# Patient Record
Sex: Male | Born: 1945
Health system: Southern US, Community
[De-identification: ages and names within clinical notes are randomized; demographics above are authoritative.]

## PROBLEM LIST (undated history)

## (undated) ENCOUNTER — Inpatient Hospital Stay: Payer: Self-pay

## (undated) DIAGNOSIS — Z86010 Personal history of colonic polyps: Secondary | ICD-10-CM

## (undated) DIAGNOSIS — E785 Hyperlipidemia, unspecified: Secondary | ICD-10-CM

## (undated) DIAGNOSIS — E669 Obesity, unspecified: Secondary | ICD-10-CM

## (undated) DIAGNOSIS — I2510 Atherosclerotic heart disease of native coronary artery without angina pectoris: Secondary | ICD-10-CM

## (undated) DIAGNOSIS — I10 Essential (primary) hypertension: Secondary | ICD-10-CM

## (undated) DIAGNOSIS — T4145XA Adverse effect of unspecified anesthetic, initial encounter: Secondary | ICD-10-CM

## (undated) DIAGNOSIS — T8859XA Other complications of anesthesia, initial encounter: Secondary | ICD-10-CM

## (undated) DIAGNOSIS — I499 Cardiac arrhythmia, unspecified: Secondary | ICD-10-CM

## (undated) DIAGNOSIS — S0410XA Injury of oculomotor nerve, unspecified side, initial encounter: Secondary | ICD-10-CM

## (undated) DIAGNOSIS — K5730 Diverticulosis of large intestine without perforation or abscess without bleeding: Secondary | ICD-10-CM

## (undated) DIAGNOSIS — H532 Diplopia: Secondary | ICD-10-CM

## (undated) DIAGNOSIS — I4892 Unspecified atrial flutter: Secondary | ICD-10-CM

## (undated) DIAGNOSIS — M1990 Unspecified osteoarthritis, unspecified site: Secondary | ICD-10-CM

## (undated) HISTORY — DX: Unspecified atrial flutter: I48.92

## (undated) HISTORY — PX: COLONOSCOPY W/ POLYPECTOMY: SHX1380

## (undated) HISTORY — DX: Injury of oculomotor nerve, unspecified side, initial encounter: S04.10XA

## (undated) HISTORY — DX: Personal history of colonic polyps: Z86.010

## (undated) HISTORY — PX: CERVICAL FUSION: SHX112

## (undated) HISTORY — PX: TONSILLECTOMY: SUR1361

## (undated) HISTORY — DX: Diverticulosis of large intestine without perforation or abscess without bleeding: K57.30

## (undated) HISTORY — DX: Unspecified osteoarthritis, unspecified site: M19.90

## (undated) HISTORY — PX: SYNOVIAL CYST EXCISION: SUR507

## (undated) HISTORY — DX: Essential (primary) hypertension: I10

## (undated) HISTORY — DX: Hyperlipidemia, unspecified: E78.5

## (undated) HISTORY — DX: Obesity, unspecified: E66.9

## (undated) HISTORY — DX: Diplopia: H53.2

## (undated) SURGERY — Surgical Case
Anesthesia: *Unknown

---

## 1999-09-21 ENCOUNTER — Ambulatory Visit (HOSPITAL_COMMUNITY): Admission: RE | Admit: 1999-09-21 | Discharge: 1999-09-21 | Payer: Self-pay | Admitting: Neurosurgery

## 1999-11-09 ENCOUNTER — Ambulatory Visit (HOSPITAL_COMMUNITY): Admission: RE | Admit: 1999-11-09 | Discharge: 1999-11-09 | Payer: Self-pay | Admitting: Neurosurgery

## 1999-11-29 ENCOUNTER — Encounter: Admission: RE | Admit: 1999-11-29 | Discharge: 1999-11-29 | Payer: Self-pay | Admitting: Neurosurgery

## 1999-11-29 ENCOUNTER — Encounter: Payer: Self-pay | Admitting: Neurosurgery

## 2001-04-20 ENCOUNTER — Encounter: Payer: Self-pay | Admitting: Neurosurgery

## 2001-04-20 ENCOUNTER — Ambulatory Visit (HOSPITAL_COMMUNITY): Admission: RE | Admit: 2001-04-20 | Discharge: 2001-04-20 | Payer: Self-pay | Admitting: Neurosurgery

## 2003-12-20 HISTORY — PX: REPLACEMENT TOTAL KNEE: SUR1224

## 2005-12-19 HISTORY — PX: COLON RESECTION: SHX5231

## 2006-04-06 ENCOUNTER — Emergency Department (HOSPITAL_COMMUNITY): Admission: EM | Admit: 2006-04-06 | Discharge: 2006-04-06 | Payer: Self-pay | Admitting: Emergency Medicine

## 2006-04-20 ENCOUNTER — Ambulatory Visit: Payer: Self-pay | Admitting: *Deleted

## 2006-05-16 ENCOUNTER — Ambulatory Visit: Payer: Self-pay

## 2006-05-16 ENCOUNTER — Encounter: Payer: Self-pay | Admitting: Cardiology

## 2006-05-18 ENCOUNTER — Ambulatory Visit: Payer: Self-pay | Admitting: *Deleted

## 2006-06-26 ENCOUNTER — Ambulatory Visit: Payer: Self-pay | Admitting: *Deleted

## 2006-08-01 ENCOUNTER — Ambulatory Visit: Payer: Self-pay | Admitting: *Deleted

## 2006-09-07 ENCOUNTER — Ambulatory Visit: Payer: Self-pay | Admitting: *Deleted

## 2006-12-06 ENCOUNTER — Ambulatory Visit: Payer: Self-pay | Admitting: *Deleted

## 2006-12-07 ENCOUNTER — Ambulatory Visit: Payer: Self-pay | Admitting: *Deleted

## 2007-04-17 ENCOUNTER — Ambulatory Visit: Payer: Self-pay | Admitting: *Deleted

## 2007-04-19 ENCOUNTER — Ambulatory Visit: Payer: Self-pay | Admitting: *Deleted

## 2007-04-19 LAB — CONVERTED CEMR LAB
AST: 31 units/L (ref 0–37)
Albumin: 4.3 g/dL (ref 3.5–5.2)
Bilirubin, Direct: 0.1 mg/dL (ref 0.0–0.3)
Chloride: 104 meq/L (ref 96–112)
Cholesterol: 105 mg/dL (ref 0–200)
Creatinine, Ser: 1 mg/dL (ref 0.4–1.5)
Glucose, Bld: 97 mg/dL (ref 70–99)
HDL: 31.6 mg/dL — ABNORMAL LOW (ref >39.0)
Sodium: 139 meq/L (ref 135–145)
Total Bilirubin: 1.1 mg/dL (ref 0.3–1.2)

## 2007-06-26 ENCOUNTER — Ambulatory Visit: Payer: Self-pay | Admitting: Internal Medicine

## 2007-09-10 ENCOUNTER — Inpatient Hospital Stay (HOSPITAL_COMMUNITY): Admission: RE | Admit: 2007-09-10 | Discharge: 2007-09-14 | Payer: Self-pay | Admitting: Specialist

## 2007-10-02 ENCOUNTER — Ambulatory Visit: Payer: Self-pay | Admitting: Internal Medicine

## 2007-10-02 LAB — CONVERTED CEMR LAB
BUN: 21 mg/dL (ref 6–23)
CO2: 30 meq/L (ref 19–32)
GFR calc Af Amer: 97 mL/min
Potassium: 4.4 meq/L (ref 3.5–5.1)
Sodium: 144 meq/L (ref 135–145)

## 2007-10-22 ENCOUNTER — Encounter: Admission: RE | Admit: 2007-10-22 | Discharge: 2007-10-22 | Payer: Self-pay | Admitting: Family Medicine

## 2007-12-20 DIAGNOSIS — K5730 Diverticulosis of large intestine without perforation or abscess without bleeding: Secondary | ICD-10-CM

## 2007-12-20 DIAGNOSIS — Z860100 Personal history of colon polyps, unspecified: Secondary | ICD-10-CM

## 2007-12-20 HISTORY — DX: Diverticulosis of large intestine without perforation or abscess without bleeding: K57.30

## 2007-12-20 HISTORY — DX: Personal history of colonic polyps: Z86.010

## 2007-12-20 HISTORY — DX: Personal history of colon polyps, unspecified: Z86.0100

## 2007-12-25 ENCOUNTER — Ambulatory Visit: Payer: Self-pay | Admitting: Gastroenterology

## 2007-12-25 LAB — CONVERTED CEMR LAB
ALT: 36 units/L (ref 0–53)
Albumin: 4.5 g/dL (ref 3.5–5.2)
Alkaline Phosphatase: 82 units/L (ref 39–117)
BUN: 14 mg/dL (ref 6–23)
Basophils Absolute: 0 10*3/uL (ref 0.0–0.1)
CO2: 31 meq/L (ref 19–32)
Calcium: 10.2 mg/dL (ref 8.4–10.5)
Folate: 17.4 ng/mL
GFR calc Af Amer: 79 mL/min
GFR calc non Af Amer: 65 mL/min
HCT: 40.9 % (ref 39.0–52.0)
MCHC: 34.5 g/dL (ref 30.0–36.0)
Neutrophils Relative %: 63 % (ref 43.0–77.0)
Platelets: 202 10*3/uL (ref 150–400)
Potassium: 4.7 meq/L (ref 3.5–5.1)
RBC: 4.35 M/uL (ref 4.22–5.81)
RDW: 14 % (ref 11.5–14.6)
Saturation Ratios: 22.7 % (ref 20.0–50.0)
Sed Rate: 10 mm/hr (ref 0–20)
TSH: 0.77 microintl units/mL (ref 0.35–5.50)
Transferrin: 258.1 mg/dL (ref >=212.0)
Vitamin B-12: 354 pg/mL (ref 211–911)

## 2007-12-31 ENCOUNTER — Ambulatory Visit: Payer: Self-pay | Admitting: Gastroenterology

## 2007-12-31 ENCOUNTER — Encounter: Payer: Self-pay | Admitting: Gastroenterology

## 2008-01-17 ENCOUNTER — Ambulatory Visit: Payer: Self-pay | Admitting: Gastroenterology

## 2008-01-21 ENCOUNTER — Ambulatory Visit: Payer: Self-pay | Admitting: Internal Medicine

## 2008-03-20 ENCOUNTER — Encounter (INDEPENDENT_AMBULATORY_CARE_PROVIDER_SITE_OTHER): Payer: Self-pay | Admitting: Surgery

## 2008-03-20 ENCOUNTER — Inpatient Hospital Stay (HOSPITAL_COMMUNITY): Admission: RE | Admit: 2008-03-20 | Discharge: 2008-03-23 | Payer: Self-pay | Admitting: Surgery

## 2008-08-04 ENCOUNTER — Ambulatory Visit: Payer: Self-pay | Admitting: Internal Medicine

## 2008-08-04 LAB — CONVERTED CEMR LAB: AST: 21 units/L (ref 0–37)

## 2008-09-18 ENCOUNTER — Ambulatory Visit: Payer: Self-pay | Admitting: Internal Medicine

## 2009-01-15 ENCOUNTER — Ambulatory Visit: Payer: Self-pay | Admitting: Internal Medicine

## 2009-05-05 ENCOUNTER — Ambulatory Visit: Payer: Self-pay | Admitting: Internal Medicine

## 2009-05-19 ENCOUNTER — Telehealth: Payer: Self-pay | Admitting: Internal Medicine

## 2009-07-07 DIAGNOSIS — E785 Hyperlipidemia, unspecified: Secondary | ICD-10-CM | POA: Insufficient documentation

## 2009-07-07 DIAGNOSIS — I10 Essential (primary) hypertension: Secondary | ICD-10-CM | POA: Insufficient documentation

## 2009-07-09 ENCOUNTER — Ambulatory Visit: Payer: Self-pay | Admitting: Internal Medicine

## 2009-07-09 DIAGNOSIS — I4892 Unspecified atrial flutter: Secondary | ICD-10-CM

## 2009-09-07 ENCOUNTER — Encounter: Payer: Self-pay | Admitting: Internal Medicine

## 2009-09-07 ENCOUNTER — Telehealth: Payer: Self-pay | Admitting: Internal Medicine

## 2009-09-10 ENCOUNTER — Ambulatory Visit: Payer: Self-pay | Admitting: Internal Medicine

## 2009-09-10 ENCOUNTER — Encounter: Payer: Self-pay | Admitting: Cardiovascular Disease

## 2009-09-10 DIAGNOSIS — R42 Dizziness and giddiness: Secondary | ICD-10-CM

## 2009-09-17 ENCOUNTER — Ambulatory Visit: Payer: Self-pay | Admitting: Internal Medicine

## 2009-09-21 ENCOUNTER — Telehealth: Payer: Self-pay | Admitting: Internal Medicine

## 2009-09-24 ENCOUNTER — Encounter (INDEPENDENT_AMBULATORY_CARE_PROVIDER_SITE_OTHER): Payer: Self-pay | Admitting: *Deleted

## 2009-09-25 ENCOUNTER — Telehealth (INDEPENDENT_AMBULATORY_CARE_PROVIDER_SITE_OTHER): Payer: Self-pay | Admitting: *Deleted

## 2009-10-02 ENCOUNTER — Telehealth: Payer: Self-pay | Admitting: Internal Medicine

## 2009-11-03 ENCOUNTER — Encounter: Payer: Self-pay | Admitting: Internal Medicine

## 2010-01-12 ENCOUNTER — Encounter (INDEPENDENT_AMBULATORY_CARE_PROVIDER_SITE_OTHER): Payer: Self-pay | Admitting: *Deleted

## 2010-01-12 ENCOUNTER — Telehealth: Payer: Self-pay | Admitting: Internal Medicine

## 2010-01-26 ENCOUNTER — Telehealth (INDEPENDENT_AMBULATORY_CARE_PROVIDER_SITE_OTHER): Payer: Self-pay | Admitting: *Deleted

## 2010-02-18 ENCOUNTER — Encounter: Admission: RE | Admit: 2010-02-18 | Discharge: 2010-02-18 | Payer: Self-pay | Admitting: Internal Medicine

## 2010-02-25 ENCOUNTER — Encounter: Admission: RE | Admit: 2010-02-25 | Discharge: 2010-02-25 | Payer: Self-pay | Admitting: Internal Medicine

## 2010-03-05 ENCOUNTER — Ambulatory Visit: Payer: Self-pay | Admitting: Internal Medicine

## 2010-11-22 ENCOUNTER — Ambulatory Visit: Payer: Self-pay | Admitting: Internal Medicine

## 2010-11-25 LAB — CONVERTED CEMR LAB
BUN: 23 mg/dL (ref 6–23)
CO2: 28 meq/L (ref 19–32)
Chloride: 99 meq/L (ref 96–112)
Cholesterol: 125 mg/dL (ref 0–200)
Creatinine, Ser: 1.1 mg/dL (ref 0.4–1.5)
LDL Cholesterol: 72 mg/dL (ref 0–99)
Potassium: 4.6 meq/L (ref 3.5–5.1)
Total CHOL/HDL Ratio: 4
Triglycerides: 115 mg/dL (ref 0.0–149.0)
VLDL: 23 mg/dL (ref 0.0–40.0)

## 2010-11-29 ENCOUNTER — Telehealth: Payer: Self-pay | Admitting: Internal Medicine

## 2010-12-10 ENCOUNTER — Telehealth: Payer: Self-pay | Admitting: Internal Medicine

## 2011-01-20 NOTE — Letter (Signed)
Summary: Appointment - Reminder 2  [facility], Main Office  [address]   [place], [place] [address]   Phone: [phone]  Fax: [phone]     January 12, 2010 MRN: [id number]   [name] [address] [place], [place]  [address]   Dear [name],  Our records indicate that it is time to schedule a follow-up appointment with Dr. Tenny Craw. It is very important that we reach you to schedule this appointment. We look forward to participating in your health care needs. Please contact us at the number listed above at your earliest convenience to schedule your appointment.  If you are unable to make an appointment at this time, give [place] a call so we can update our records.   Sincerely,    Migdalia Dk [facility] Scheduling Team

## 2011-01-20 NOTE — Progress Notes (Signed)
Summary: req call back  Phone Note Call from Patient Call back at Home Phone [phone] Call back at [phone]   Caller: Patient Reason for Call: Talk to Nurse Summary of Call: request call back about meds Initial call taken by: Migdalia Dk,  January 12, 2010 2:30 PM  Follow-up for Phone Call        Called pt. back ..he states that his insurance coverage has changed and that he would like a new script for Amlodipine/Benazapril combination medication. Advised him will send to [facility]. Follow-up by: J REISS RN    New/Updated Medications: AMLODIPINE BESY-BENAZEPRIL HCL 5-20 MG CAPS (AMLODIPINE BESY-BENAZEPRIL HCL) 1 tablet every 12 hours Prescriptions: AMLODIPINE BESY-BENAZEPRIL HCL 5-20 MG CAPS (AMLODIPINE BESY-BENAZEPRIL HCL) 1 tablet every 12 hours  #60 x 6   Entered by:   Layne Benton, RN, BSN   Authorized by:   Sherrill Raring, MD, [facility]   Signed by:   Layne Benton, RN, BSN on 01/12/2010   Method used:   Electronically to        [facility]* (retail)       [address] [place], [place]  [address]       Ph: [phone] or [phone]       Fax: [phone]   RxID:   [id number]

## 2011-01-20 NOTE — Progress Notes (Signed)
Summary: test results  Phone Note Call from Patient Call back at Home Phone [phone]   Caller: Patient Reason for Call: Lab or Test Results Initial call taken by: Judie Grieve,  November 29, 2010 10:12 AM  Follow-up for Phone Call        11/29/10--1030am--results of lab and instructions given to pt  Follow-up by: Ledon Snare, RN,  November 29, 2010 10:19 AM

## 2011-01-20 NOTE — Procedures (Signed)
Summary: Holter  Holter   Imported By: Marylou Mccoy 03/05/2010 14:57:19  _____________________________________________________________________  External Attachment:    Type:   Image     Comment:   External Document

## 2011-01-20 NOTE — Assessment & Plan Note (Signed)
Summary: rov   Visit Type:  Follow-up Primary Provider:  Dr Leticia Clas at [facility]  CC:  no compliants.  History of Present Illness: Patient is a 65 year old with a history of paroxysmal atrial flutter, hypertension, dyslipidemia.  I last saw him in the fall.  He was previously followed by J. Adolph Pollack. I saw him last in march.  At the time he was complaining of frontal headaches. since seen the headaches have improved.  Breathing is ok   Denies palpitations.  No CP_ Admits to not being htat active, wants to increase. BPs at home range 130s/60 to 70  Current Medications (verified): 1)  Simvastatin 40 Mg Tabs (Simvastatin) .... Take 1 Tablet By Mouth Every Night 2)  Fish Oil Double Strength 1200 Mg Caps (Omega-3 Fatty Acids) .Marland Kitchen.. 1 Daily 3)  Aspirin 81 Mg Tbec (Aspirin) .... Take One Tablet By Mouth Daily 4)  Celebrex 200 Mg Caps (Celecoxib) .... As Needed 5)  Hydrocodone-Acetaminophen 5-500 Mg Tabs (Hydrocodone-Acetaminophen) .... As Needed 6)  Hydrochlorothiazide 25 Mg Tabs (Hydrochlorothiazide) .... 1/2 Tab By Mouth Once Daily 7)  Norvasc 5 Mg Tabs (Amlodipine Besylate) .Marland Kitchen.. 1 Tablet 2 Times Per Day 8)  Benazepril Hcl 20 Mg Tabs (Benazepril Hcl) .Marland Kitchen.. 1 Tablet 2 Times Per Day 9)  Flax   Oil (Flaxseed (Linseed)) .... Once Daily  Allergies (verified): 1)  ! Niacin  Past History:  Past medical, surgical, family and social histories (including risk factors) reviewed, and no changes noted (except as noted below).  Past Medical History: Reviewed history from 07/09/2009 and no changes required.   Current Problems:  ATRIAL FLUTTER, PAROXYSMAL DYSLIPIDEMIA (ICD-272.4) HYPERTENSION (ICD-401.9)  Past Surgical History: Reviewed history from 07/07/2009 and no changes required.   Cervical fusion and knee surgery.   Family History: Reviewed history from 07/07/2009 and no changes required. Positive for coronary artery disease.   Social History: Reviewed history from 07/07/2009 and no  changes required.  The patient is married.  He is retired.  He does not   smoke or drink.   Review of Systems       All systems reviewed.  Neg to the above problem.  Vital Signs:  Patient profile:   65 year old male Height:      72 inches Weight:      276 pounds BMI:     37.57 Pulse rate:   65 / minute BP sitting:   146 / 72 Cuff size:   large  Vitals Entered By: Hardin Negus, RMA (November 22, 2010 10:08 AM)  Physical Exam  Additional Exam:  Patient is in NAD HEENT:  Normocephalic, atraumatic. EOMI, PERRLA.  Neck: JVP is normal. No thyromegaly. No bruits.  Lungs: clear to auscultation. No rales no wheezes.  Heart: Regular rate and rhythm. Normal S1, S2. No S3.   No significant murmurs. PMI not displaced.  Abdomen:  Supple, nontender. Normal bowel sounds. No masses. No hepatomegaly.  Extremities:   Good distal pulses throughout. No lower extremity edema.  Musculoskeletal :moving all extremities.  Neuro:   alert and oriented x3.    Impression & Recommendations:  Problem # 1:  HYPERTENSION (ICD-401.9) adequate control.  WQOuld check bmet today.  Problem # 2:  ATRIAL FLUTTER, PAROXYSMAL (ICD-427.32) No symptoms.  Keep on regimen. His updated medication list for this problem includes:    Aspirin 81 Mg Tbec (Aspirin) .Marland Kitchen... Take one tablet by mouth daily  Problem # 3:  DYSLIPIDEMIA (ICD-272.4) Contineue.  Check labs.  Will need  to review with norvasc use.  May need to switch. His updated medication list for this problem includes:    Simvastatin 40 Mg Tabs (Simvastatin) .Marland Kitchen... Take 1 tablet by mouth every night  Other Orders: TLB-BMP (Basic Metabolic Panel-BMET) (80048-METABOL) TLB-Lipid Panel (80061-LIPID) TLB-AST (SGOT) (84450-SGOT)  Patient Instructions: 1)  Your physician recommends that you return for lab work in: lab work today..we will call you with results. 2)  Your physician wants you to follow-up in:  August 2012  You will receive a reminder letter in the  mail two months in advance. If you don't receive a letter, please call our office to schedule the follow-up appointment.

## 2011-01-20 NOTE — Progress Notes (Signed)
  Phone Note Outgoing Call   Call placed by: JREISS RN Summary of Call: Amlodipine/Benaz.  Follow-up for Phone Call        Called patient to let him know that [facility] will not cover this combination medication. (they had sent [place] a fax). Therefore we have to order medications separately. Follow-up by: J Vannary Greening RN    New/Updated Medications: NORVASC 5 MG TABS (AMLODIPINE BESYLATE) 1 tablet 2 times per day BENAZEPRIL HCL 20 MG TABS (BENAZEPRIL HCL) 1 tablet 2 times per day Prescriptions: BENAZEPRIL HCL 20 MG TABS (BENAZEPRIL HCL) 1 tablet 2 times per day  #60 x 6   Entered by:   Layne Benton, RN, BSN   Authorized by:   Sherrill Raring, MD, [facility]   Signed by:   Layne Benton, RN, BSN on 01/26/2010   Method used:   Electronically to        [facility]* (retail)       [address] [place], [place]  [address]       Ph: [phone] or [phone]       Fax: [phone]   RxID:   [id number] NORVASC 5 MG TABS (AMLODIPINE BESYLATE) 1 tablet 2 times per day  #60 x 6   Entered by:   Layne Benton, RN, BSN   Authorized by:   Sherrill Raring, MD, [facility]   Signed by:   Layne Benton, RN, BSN on 01/26/2010   Method used:   Electronically to        [facility]* (retail)       [address] [place], [place]  [address]       Ph: [phone] or [phone]       Fax: [phone]   RxID:   [id number]

## 2011-01-20 NOTE — Assessment & Plan Note (Signed)
Summary: 8 month rov   Primary Provider:  Dr Leticia Clas at [facility]   History of Present Illness: Patient is a 64 year old with a history of paroxysmal atrial flutter, hypertension, dyslipidemia.  I last saw him in the fall.  He was previously followed by J. Adolph Pollack. When I saw him last he was complaining of weak feelings, palpitations.  I set him up with a holter monitor which showed no signif arrhythmia. Since then the spells have resolved.  he denies dizzines, no CP.  Breathing is ok.  No palp. His biggest complaint is frontal headaches.  MRI, CT have been neg.  He has an appt with M. Roy in a few wks.  Current Medications (verified): 1)  Simvastatin 40 Mg Tabs (Simvastatin) .... Take 1 Tablet By Mouth Every Night 2)  Fish Oil Double Strength 1200 Mg Caps (Omega-3 Fatty Acids) .Marland Kitchen.. 1 Daily 3)  Aspirin 81 Mg Tbec (Aspirin) .... Take One Tablet By Mouth Daily 4)  Celebrex 200 Mg Caps (Celecoxib) .... As Needed 5)  Hydrocodone-Acetaminophen 5-500 Mg Tabs (Hydrocodone-Acetaminophen) .... As Needed 6)  Hydrochlorothiazide 25 Mg Tabs (Hydrochlorothiazide) .... 1/2 Tab By Mouth Once Daily 7)  Norvasc 5 Mg Tabs (Amlodipine Besylate) .Marland Kitchen.. 1 Tablet 2 Times Per Day 8)  Benazepril Hcl 20 Mg Tabs (Benazepril Hcl) .Marland Kitchen.. 1 Tablet 2 Times Per Day  Allergies: 1)  ! Niacin  Past History:  Past medical, surgical, family and social histories (including risk factors) reviewed, and no changes noted (except as noted below).  Past Medical History: Reviewed history from 07/09/2009 and no changes required.   Current Problems:  ATRIAL FLUTTER, PAROXYSMAL DYSLIPIDEMIA (ICD-272.4) HYPERTENSION (ICD-401.9)  Past Surgical History: Reviewed history from 07/07/2009 and no changes required.   Cervical fusion and knee surgery.   Family History: Reviewed history from 07/07/2009 and no changes required. Positive for coronary artery disease.   Social History: Reviewed history from 07/07/2009 and no changes  required.  The patient is married.  He is retired.  He does not   smoke or drink.   Vital Signs:  Patient profile:   65 year old male Height:      72 inches Weight:      275 pounds BMI:     37.43 Pulse rate:   52 / minute Resp:     12 per minute BP sitting:   147 / 80  (left arm)  Vitals Entered By: Kem Parkinson (March 05, 2010 9:08 AM)  Physical Exam  Additional Exam:  patient is in NAD.   HEENT:  Normocephalic, atraumatic. EOMI, PERRLA.  Neck: JVP is normal. No thyromegaly. No bruits.  Lungs: clear to auscultation. No rales no wheezes.  Heart: Regular rate and rhythm. Normal S1, S2. No S3.   No significant murmurs. PMI not displaced.  Abdomen:  Supple, nontender. Normal bowel sounds. No masses. No hepatomegaly.  Extremities:   Good distal pulses throughout. No lower extremity edema.  Musculoskeletal :moving all extremities.  Neuro:   alert and oriented x3.    EKG  Procedure date:  03/05/2010  Findings:      Sinus bradycardia.  53 bpm.  Impression & Recommendations:  Problem # 1:  ATRIAL FLUTTER, PAROXYSMAL (ICD-427.32) No symptoms for atrial flutter.  Continue current regimen.  Problem # 2:  HYPERTENSION (ICD-401.9) BP per his report is better at home.  Would keep on same regimen. His updated medication list for this problem includes:    Aspirin 81 Mg Tbec (Aspirin) .Marland Kitchen... Take one  tablet by mouth daily    Hydrochlorothiazide 25 Mg Tabs (Hydrochlorothiazide) .Marland Kitchen... 1/2 tab by mouth once daily    Norvasc 5 Mg Tabs (Amlodipine besylate) .Marland Kitchen... 1 tablet 2 times per day    Benazepril Hcl 20 Mg Tabs (Benazepril hcl) .Marland Kitchen... 1 tablet 2 times per day  Problem # 3:  DIZZINESS (ICD-780.4) Resolved.  Problem # 4:  DYSLIPIDEMIA (ICD-272.4) Will get records for Dr. Venita Sheffield office re lipids His updated medication list for this problem includes:    Simvastatin 40 Mg Tabs (Simvastatin) .Marland Kitchen... Take 1 tablet by mouth every night  Other Orders: EKG w/ Interpretation  (93000)  Patient Instructions: 1)  Your physician recommends that you schedule a follow-up appointment in: december with dr Tenny Craw 2)  Your physician recommends that you continue on your current medications as directed. Please refer to the Current Medication list given to you today.

## 2011-01-20 NOTE — Progress Notes (Signed)
Summary: pt rtn call  Phone Note Call from Patient Call back at [phone]   Caller: Patient Reason for Call: Talk to Nurse, Talk to Doctor, Lab or Test Results Summary of Call: pt rtn call  Initial call taken by: Omer Jack,  December 10, 2010 3:30 PM  Follow-up for Phone Call        Called patient back. He will decrease dose of Simvastatin to 20mg  HS and have a repeat Lipid/ast in July 2012 at [facility]. office per Dr.Ross.  Layne Benton, RN, BSN  December 10, 2010 4:35 PM      New/Updated Medications: SIMVASTATIN 40 MG TABS (SIMVASTATIN) Take one half  tablet by mouth every night

## 2011-02-07 ENCOUNTER — Telehealth: Payer: Self-pay | Admitting: Internal Medicine

## 2011-02-08 ENCOUNTER — Other Ambulatory Visit: Payer: Self-pay | Admitting: Internal Medicine

## 2011-02-11 ENCOUNTER — Ambulatory Visit
Admission: RE | Admit: 2011-02-11 | Discharge: 2011-02-11 | Disposition: A | Discharge status: Home / Self Care | Payer: Medicare Other | Source: Ambulatory Visit | Attending: Internal Medicine | Admitting: Internal Medicine

## 2011-02-15 NOTE — Progress Notes (Addendum)
Summary: has question re lab work  Phone Note Call from Patient   Caller: Patient [phone] Reason for Call: Talk to Nurse Summary of Call: pt calling re lab work results he recently had that was elevated and results we have Initial call taken by: Glynda Jaeger,  February 07, 2011 12:17 PM  Follow-up for Phone Call        Called patient back and he advised me that he had an ast level at [facility] and the level was 49. He will have a repeat in 1 month. The dose of Simvastatin was cut in half about 3 to 4 weeks ago. I asked him to have the repeat ast level fowarded to Dr.Ross.  Layne Benton, RN, BSN  February 07, 2011 1:30 PM      Appended Document: has question re lab work [employer]

## 2011-05-03 NOTE — Letter (Signed)
January 17, 2008    Dr. Karie Soda  [address]  [place], [place]  [address]   RE:  DRAYDON, CLAIRMONT  MRN:  [id number]  /  DOB:  1946/05/05   Dear Dr. Michaell Cowing:   I would appreciate if you could see Niam Nepomuceno for possible sigmoid  resection.   Enclosed are my office notes and colonoscopy reports detailing his  recurrent diverticulitis.  Despite a high fiber diet and daily Metamucil  with twice a day Pamine 2 mg, he continues to have rather persistent  discomfort and tenderness in the left lower quadrant.   As per my previous notes, he has had recurrent bouts of diverticulitis  for several years and I think he is high risk for complications from  diverticulitis.  I would appreciate if you could see him concerning  laparoscopic sigmoid resection which he would like to have done  laparoscopically if possible.   Please feel free to contact me if you need other information concerning  his care.  He is followed from a cardiac standpoint by Dr. Dietrich Pates  for a past history of atrial flutter which is under  control at this time.  He does suffer from hyperlipidemia and  hypertension that is followed by Dr. Bradd Canary.    Sincerely,      Vania Rea. Jarold Motto, MD, [facility], FAGA  Electronically Signed    DRP/MedQ  DD: 01/17/2008  DT: 01/17/2008  Job #: [id number]   CC:   Quita Skye. Artis Flock, M.D.  Pricilla Riffle, MD, [facility]

## 2011-05-03 NOTE — Op Note (Signed)
NAMEDIOR, STEPTER               ACCOUNT NO.:  [id number]   MEDICAL RECORD NO.:  [id number]          PATIENT TYPE:  INP   LOCATION:  0001                         FACILITY:  [facility]   PHYSICIAN:  Ardeth Sportsman, MD     DATE OF BIRTH:  March 08, 1946   DATE OF PROCEDURE:  03/20/2008  DATE OF DISCHARGE:                               OPERATIVE REPORT   PRIMARY CARE PHYSICIAN:  Quita Skye. Artis Flock, M.D.   GASTROENTEROLOGIST:  Vania Rea. Jarold Motto, MD, Clementeen Graham, FACP, FAGA   PREOPERATIVE DIAGNOSES:  1. Left colon diverticulosis.  2. Recurrent sigmoid diverticulitis.   POSTOPERATIVE DIAGNOSES:  1. Left colon diverticulosis.  2. Recurrent sigmoid diverticulitis.  3. Umbilical hernia.   PROCEDURES:  1. Laparoscopic embolization of splenic flexure of the colon.  2. Laparoscopically assisted distal descending and sigmoid colectomy      with 33 EEA stapled anastomosis.  3. Umbilical hernia repair.  4. Rigid proctoscopy performed by Dr. Corliss Skains.   SURGEON:  Ardeth Sportsman, MD   ASSISTANT:  Wilmon Arms. Tsuei, M.D.   SPECIMENS:  Sigmoid colon and distal descending colon.   DRAINS:  None.   ESTIMATED BLOOD LOSS:  100 ml.   COMPLICATIONS:  No major complications.   INDICATIONS:  Mr. Zaino is a 65 year old gentleman who has had  recurrent bouts of diverticulitis, managed by Drs. Melrose Nakayama and Jarold Motto.  He had colonoscopy which showed evidence of left sided diverticulosis  and has had CT scan findings of sigmoid diverticulitis in the past.  Given the recurrent episodes a surgical consultation was made.   Manner and physiology of the digestive tract was explained.  Past  physiology with diverticulosis with restrictive diverticulitis for  friction abscess and other natural histories were discussed.  Options  were discussed, recommendation was made for segmental colonic resection  of the chronically diseased colon.  Operation to do is laparoscopic  assisted with a possibility of open resection.   Numerous risks, benefits and alternatives were discussed in detail.  Questions answered and the patient agreed to proceed.   OPERATIVE FINDINGS:  His sigmoid colon was intermittently inflamed,  especially on the distal sigmoid colon and the junction between the  descending and sigmoid colon as well.  He had moderate diverticula on  the left side of his colon.  He had a very thickened omentum.  There was  no evidence of any other abnormality.   DESCRIPTION OF PROCEDURE:  Informed consent was confirmed.  The patient  had compressive devices active during the entire case.  He received IV  Invanz just prior to surgery.  He underwent general surgery without any  difficulty.  He was positioned in low lithotomy with arms tucked.   Entry was gain in the abdomen by placing a 5 ml port in the right upper  quadrant using optical entry technique with the patient in deep reverse  Trendelenburg and right side up.  There was no evidence of injury.  Capnoperitoneum all when burred to 15.5 mmHg provided good  intraabdominal insufflation.  Under direct visualization, 5 mm ports  were placed in the left upper quadrant  and left lower quadrant.  A 10 mm  port was placed through the umbilical hernia.  A 12 mm port was placed  in the right lower quadrant.   The patient was positioned head down, right side down.  Principal bowel  was reflected that way.  I could see moderate adhesions of the sigmoid  colon to the lateral wall.  It seemed to go all the way down to the  peritoneal reflection.  The right side of the sigmoid mesenteric  adhesions could easily be seen.  The peritoneal covering on the right  side was freed off the sigmoid mesentery.  The sigmoid mesentery was  elevated anteriorly to get it into retromesenteric plane (Toldt's  fascia).  Upon doing it, the left ureter could easily be seen.  It was  identified and preserved at all times.  With careful blunt dissected and  focused ligature dissection  we were able to free the left side of the  colon mesentery off its retroperitoneal attachments from the sigmoid  colon up to the descending colon and the splenic flexure as well as the  distal transverse colon.  Doing that, I could identify the segmental  left superior hemorrhoidal arteries and these were isolated and  transected using LigaSure about midway up between the colon and down to  its base and this was for diverticulitis and not for cancer.  Care was  made to be sure that the ureter was not involved.   With medial and lateral formalization done, I went ahead and mobilized  the sigmoid colon from its lateral sidewall attachments using a  primarily scissors with occasional cautery.  This helped mobilize the  sigmoid colon off the pelvic sidewall of the left pericolic gutter up  toward the splenic flexure.  The greater omentum was freed off the  distal third of the transverse colon.  In doing this, I could identify  the splenocolic attachments and these were freed off using LigaSure to  provide pretty good splenic flexure mobilization and helped bring the  colon down.   Attention was turned down the pelvis and sigmoid mesentery was taken  down close to the sigmoid near the rectosigmoid junction.  I identified  an area of normal rectum.  Mesentery was skeletonized off this and  transection was done at the sigmoid/rectosigmoid junction using a 45  laparoscopic blue load stapler.  One fire nearly went totally across and  I just needed 1 extra fire to go and help preserve a corner.   There was some small oozures in the pelvis but these were controlled  with focused LigaSure.  After copious irrigation of a liter of saline,  there was clear return.   With this, the left sided colon was very well mobilized.  Given the fact  that he already had an umbilical hernia that I was planning to close, I  went in and extended his infraumbilical port line in position more  inferiorly somewhat  through the umbilicus as well. After capping, the  perineum was completely evacuated.  A GelPort wound protector was placed  and the colon was exteriorized.  It got excellent mobilization.  It got  up to a level of mid distal, descending colon.  It appeared to be not  inflamed.  A soft bowel clamp was placed proximal to this and Kocher  placed on the specimen end and the colon was transected.  Raising  mesenteric attachments were removed in a ray like fashion, taking care  to preserve  as much of the proximal mesentery as possible to offer good  blood supply.  Specimen was sent with the open end proximal and the  stapled end distal.   VA sizers were used and a 31 gauge sizer easily fit.  Therefore, I chose  a 33 EEA stapler.  The anvil was placed into the open end and was closed  shut with a 2-0 Prolene purse-string.  It was tied down.  After freeing  off some large hemorrhoid appendages, there were 2 diverticula right at  the anvil in 2 different sections.  I was not satisfied that this was  good section, therefore, we transected another 2 cm more proximally to  an area that did not have diverticula.  At this time, I did a 0-Prolene  purse- string straight which helped bring things together very well.  We  did free off a couple of left lower appendages but we took care not to  skeletonize the mesentery to insure that there was good blood supply.  The colon was transected with a scalpel.  The mucosa was happy, healthy  and bleeding.  After the purse-string was tied down, hemostasis was  excellent.  The handle was turned to the abdomen and GelPort was placed.  Capnoperitoneum was re-introduced.   Dr. Corliss Skains went down into the pelvis.  He did gentle finger anal dilation  up to 3 fingers.  Three EEA sizers were gradually sized up to help get a  33 stapler up into the rectum. It was initially a little bit tight but  gradually it got up to the rectal stump which came up to the pelvic rim   rather easily.  Spike was brought out under direct visualization.  The  anvil of the mid distal descending colon was attached to the rectal  stump.  There was a little mild tension but the patient was in reverse  Trendelenburg at the time.  When we leveled them out there was far less  tension.  I was able to bring some of the colon down.  The anvil was  brought to the stapler, held in place for a minute, fired and clamped  for 30 seconds and released.   Inspection was made and the distal ring was healthy intact.  The  proximal ring came apart when pulling out.  I could not tell if it was  necessarily complete.  I placed a general laparoscopic pressure on the  descending colon.  Dr. Corliss Skains performed rigid proctoscopy.  He could see  up to the anastomosis which was circumferentially intact with no  bleeding.  Insufflation was done with the pelvis contents under water.  There were no bubbles.  This was redone 3 times with excellent  insufflation arguing against the leak.  The proctoscope was removed  after desufflating.   Copious irrigation was done with clear return and hemostasis was  excellent.  Just to insure there was no more tension at the anastomosis,  I went ahead and freed more greater omentum off the colon until it was  over more towards the mid transverse colon and that helped bring the  transverse colon down more easily with even less tension.  The omentum  came down very easily as well.  Capnoperitoneum was evacuated.  The 12  mm port in the right upper quadrant had been tunneled and formation and  fascial defect was too small to allow my pinky to pass so I do not feel  he needed more aggressive closure. The  wound was closed using running #1  PDS to good result.  An OnCue pain pump with 2 arms was used with a M  needle and sheath placed right paramedian and left paramedian at the  level of the fascia.  I could palpate and easily follow it down so that  it was mostly sub fascial  and somewhat subcu planes.  The catheters were  passed and met resistance at the end of it.  The sheaths were removed  and the catheters were attached to OnCue pain pump.  A 4-0 Monocryl  stitch was used to close off skin areas after midline  wound had been irrigated copiously with Saline.  A  dry dressing was  applied.  The patient was extubated and sent to recovery room in stable  condition.   I explained the operation to the patient's family.      Ardeth Sportsman, MD  Electronically Signed     SCG/MEDQ  D:  03/20/2008  T:  03/20/2008  Job:  [id number]   cc:   Quita Skye. Artis Flock, M.D.  Fax: [phone]   Vania Rea. Jarold Motto, MD, FACG, FACP, FAGA  [address]  [place]  [place] [address]

## 2011-05-03 NOTE — Discharge Summary (Signed)
NAMESADAT, SLIWA NO.:  [id number]   MEDICAL RECORD NO.:  [id number]          PATIENT TYPE:  INP   LOCATION:  1529                         [facility]ACILITY:  [facility]   PHYSICIAN:  Lennie Muckle, MD      DATE OF BIRTH:  09-07-1946   DATE OF ADMISSION:  03/20/2008  DATE OF DISCHARGE:  03/23/2008                               DISCHARGE SUMMARY   PHYSICIAN:  Ardeth Sportsman, MD   FINAL DIAGNOSIS:  Sigmoid diverticulitis.   PROCEDURE:  March 20, 2008, laparoscopic distal descending colon and  sigmoid colectomy, umbilical hernia repair, rigid proctoscopy.   [name] is a 65 year old male who was admitted following his  procedure on April 2.  Postoperatively he had remarkable recovery  requiring almost no IV narcotics, was started on Vicodin and ibuprofen  with adequate pain control.  He also had the On-Q pain system.  He was  advanced to regular diet, having bowel movements on day of discharge,  minimal amount of pain.  There was a mild ecchymosis at the incision but  no evidence of infection.  The pain ball catheter was removed the day of  discharge.  He is instructed to perform no heavy lifting greater than 20  pounds, to resume his fiber supplement and all medications as  preoperatively.   He is being discharged with Vicodin 5/325 one to two every 4 hours for  pain and take over-the-counter ibuprofen.  He will follow up with Dr.  Michaell Cowing in approximately 2-3 weeks and perform no heavy lifting greater  than 20 pounds and can shower and wash his incisions daily.      Lennie Muckle, MD  Electronically Signed     ALA/MEDQ  D:  03/23/2008  T:  03/23/2008  Job:  [id number]   cc:   Barry Dienes. Eloise Harman, M.D.  Fax: [phone]   Quita Skye. Artis Flock, M.D.  Fax: [phone]   Ardeth Sportsman, MD  [address] [place] [address]

## 2011-05-03 NOTE — Discharge Summary (Signed)
NAMEMAEL, DELAP NO.:  [id number]   MEDICAL RECORD NO.:  [id number]          PATIENT TYPE:  INP   LOCATION:  1619                         FACILITY:  [facility]   PHYSICIAN:  Erasmo Leventhal, M.D.DATE OF BIRTH:  05-22-1946   DATE OF ADMISSION:  09/10/2007  DATE OF DISCHARGE:  09/14/2007                               DISCHARGE SUMMARY   ADMISSION DIAGNOSIS:  End-stage osteoarthritis, left knee.   DISCHARGE DIAGNOSIS:  Total knee arthroplasty, left knee.   BRIEF HISTORY:  This is a 65 year old gentleman with a history of end-  stage osteoarthritis of his left knee that has failed conservative  management.  After discussion of treatment benefits, risks and options,  now schedule for total knee arthroplasty.  He has had medical clearance  from his medical doctors and surgery to go ahead as scheduled.   LABORATORY VALUES:  Admission CBC within normal limits, admission  urinalysis within normal limits, admission PT/PTT within normal limits,  admission CMET, with the exception of the glucose high at 106.  Hemoglobin and hematocrit reached a low of 9.5 and 26.9 on the 25th, and  we are back up to 9.9 and 28.1 on the 26th.  BMET showed mildly-elevated  glucose in the 130 range throughout admission, otherwise within normal  limits.  He did have a low platelet count on the 24th and 25th, all  above 120 and on discharge date went back up to normal at 152.   COURSE IN THE HOSPITAL:  The patient tolerated the operative procedure  well.  First postoperative day, vital signs were stable.  He had  moderate pain.  Hemoglobin/hematocrit were palpable, BMET normal except  glucose 130.  Lungs clear.  Heart sounds normal.  Bowel sounds active.  Dressing was dry.  Drain was removed intact.  Physical therapy was  started.  Second postoperative day, he had moderate pain.  Vital signs  stable.  Temperature to a maximum of 100.9.  Lungs clear.  Heart sounds  normal.  Bowel sounds  active.  Hemoglobin/hematocrit 10.1 and 28.7.  Dressing was changed.  Wound benign.  Calves negative.  Incentive  spirometry was increased.  Third postoperative day, vital signs were  stable.  Temperature to a maximum of 100.3, hemoglobin 9.5, hematocrit  26.9, platelets were 122.  Lungs clear.  Heart sounds normal.  Bowel  sounds active.  Calves negative and wound was benign, and plans were  made to discharge  in the morning, if his stable count was back to  normal, and on the fourth postoperative day, his vital signs were  stable.  He is afebrile,  O2 95 on room air, hemoglobin 99, hematocrit  28.1 with platelets 152.  Lungs clear.  Heart sounds normal.  Bowel  sounds active.  Calves negative, and wound benign.  The patient was  subsequently discharged home, for follow-up in the office.   CONDITION ON DISCHARGE:  Improved.   DISCHARGE MEDICATIONS:  1. Norco 7.5/325, 1 to 2, q.6 h. p.r.n. pain.  2. Lovenox one shot each day at 6:00 a.m. and 6:00 p.m., a 30 mg shot.  3. Trinsicon 1 pill twice a day for anemia.  4. Robaxin was q.8 h. p.r.n. spasm.  He is not to take aspirin, anti-inflammatories, fish oil, until he is  off of Lovenox.   DISCHARGE INSTRUCTIONS:  He is to keep wound clean and dry for 3 weeks,  do his home therapy, call __________  for a return appointment in 2  weeks, or call sooner p.r.n.      Jaquelyn Bitter. Chabon, P.A.    ______________________________  Erasmo Leventhal, M.D.    SJC/MEDQ  D:  09/14/2007  T:  09/14/2007  Job:  [id number]

## 2011-05-03 NOTE — Op Note (Signed)
[name], [name] NO.:  [id number]   MEDICAL RECORD NO.:  [id number]          PATIENT TYPE:  INP   LOCATION:  X007                         FACILITY:  [facility]   PHYSICIAN:  [name], M.D.DATE OF BIRTH:  [birth year]   DATE OF PROCEDURE:  09/10/2007  DATE OF DISCHARGE:                               OPERATIVE REPORT   PREOPERATIVE DIAGNOSIS:  Left knee end-stage osteoarthritis.   POSTOPERATIVE DIAGNOSIS:  Left knee end-stage osteoarthritis.   PROCEDURE:  Left total knee arthroplasty.   SURGEON:  [name], M.D.   ASSISTANT:  Tony Able, PA-C.   ANESTHESIA:  General with spinal.   ESTIMATED BLOOD LOSS:  Less than 100 mL.   DRAINS:  One medium Hemovac.   COMPLICATIONS:  None.   TOURNIQUET TIME:  1 hour and 25 minutes at 300 mmHg.   DISPOSITION:  PACU stable.   OPERATIVE DETAILS:  The patient counseled in the holding area.  Correct  side was identified.  Taken to operating room where spinal anesthetic  was administered.  This being unsuccessful, he was placed under general  anesthesia per the anesthesiologist.  Ancef was given intravenously.  Foley catheter placed utilizing sterile technique by the OR circulating  nurse.  All extremities were well-padded.  Left knee was examined, 5  degrees flexion contracture could flex to 110 degrees.  Elevated,  prepped DuraPrep, draped sterile fashion.  Exsanguinated with an  Esmarch, tourniquet inflated to 300 mmHg.  Straight midline incision  made in skin and subcutaneous tissue.  Medial and lateral soft tissue  flaps were developed to appropriate level.  Knee was flexed and medial  parapatellar arthrotomy was performed and medial soft tissue release was  done.  Patella was retracted out of way but not everted.  Cruciate  ligaments were resected.  End-stage arthritis changes, bone against  bone.  Central starting hole made through the distal femur, canal was  irrigated until effluent was  clear.  Intramedullary rod was gently  placed.  Chose a 5 degree valgus cut and took an 11 mm cut off distal  femur.  Distal femur was found be a size number 5.  Rotation marks were  made, cutting block was applied.   Medial and lateral menisci removed.  Geniculate vessels were coagulated.  Posterior neurovascular structures were thought off protected throughout  entire case.  Tibial eminence was resected.  Proximal tibia found be a  size 5.  Central aspect was identified, reamed with step reamer,  irrigated.  __________ rod was gently placed and took a 5 degrees valgus  cut took a 10 mm cut off the least deficient side which was lateral  side.  At this point in time with flexion/extension blocks a 10 insert  tight in flexion/extension, knee was reflexed.  Another 12 mm was taken  off the proximal tibia.  At the time, flexion-extension blocks well  balanced for 10 insert.  Femoral tibial baseplate was applied, rotation  coverage was set, reamer and step reamer and then a punch was performed.  Femoral box cut was now performed.  At this time a  size 5 femur, size 5  tibia, 10 insert were well balanced.  Patella found to be a size 38.  Appropriate amount of bone was resected.  Locking holes were made and  tracking was excellent.  All trials removed.  Knee was irrigated  pulsatile lavage. Utilizing modern __________ cement technique, all  components were cemented into place, size 5 tibia, size 5 femur, 38  patella.  Cement cured.  Excess cement was removed.  The knee was  irrigated.  With the trial 10 and 12.5 and 12.5 insert we had full  extension, flexion to 100, limited by the drapes.  Knee was stable to  varus and valgus stress throughout.  Patellofemoral tracking was  excellent.  Trial then removed.  Knee was again irrigated.  Final 12.5  posterior stabilized rotating platform tibial insert was implanted.  We  used [employer]  Sigma implants size as noted above  rotating  platform.  Median Hemovac drain was placed.  Sequential closure  layers were done, arthrotomy Vicryl, subcu Vicryl, skin was closed with  subcu Monocryl suture.  Steri-Strips were applied.  Sterile compressive  dressing applied knee.  Drains hooked to suction.  Tourniquet deflated.  Normal circulation foot and ankle end of the case.  The patient  tolerated the procedure well with no complications or problems.  Given  another gram of Ancef intravenous as tourniquet deflated.  He was  awakened, taken from operating room to PACU in stable condition.   To help with surgical time and decision making [name]'s  assistance was needed. Mr. Tony Able, PA-C assisted throughout the  entire case.  The patient was awakened, taken from operating room to PACU stable  condition.  Sponge and needle count were correct.           ______________________________  [name], M.D.     RAC/MEDQ  D:  09/10/2007  T:  09/11/2007  Job:  [id number]

## 2011-05-03 NOTE — Assessment & Plan Note (Signed)
[facility]                            CARDIOLOGY OFFICE NOTE   SYRIS, BROOKENS                      MRN:          [id number]  DATE:09/18/2008                            DOB:          1946-11-25    IDENTIFICATION:  [name] is a gentleman, who is 65 year old.  He was  previously followed by [name].  I saw him in February.  He has a  history of hypertension, dyslipidemia, and paroxysmal atrial flutter.   In the interval, he denies palpitations.  Breathing is okay.  No chest  pressure.  He is trying to exercise, biking more, but then he develop  prostate problems, he is resting from that.   CURRENT MEDICINES:  1. Amlodipine.  2. Benazepril 5/20 b.i.d.  3. Aspirin 81.  4. Metamucil.  5. Niaspan 1 g daily.  6. The patient is no longer on Lipitor.   PHYSICAL EXAMINATION:  GENERAL:  The patient is in no acute distress.  VITAL SIGNS:  Blood pressure here 140/70 at home.  He says it is in the  128-142 range, averaging about 135/65-72.  NECK:  JVP is normal.  No bruits.  LUNGS:  Clear without rales or wheezes.  CARDIAC:  Regular rate and rhythm.  S1 and S2.  No S3.  No significant  murmurs.  ABDOMEN:  Obese, benign.  EXTREMITIES:  No edema.  Pulses 2+.   A 12-lead EKG, sinus bradycardia, 53 beats per minute, and occasional  PAC.   IMPRESSION:  1. Hypertension, fair control.  Would keep on his current regimen for      now.  2. Dyslipidemia.  There was some confusion I had wanted to place him      on Simcor 40/1 g daily, but he is not on the statin now.  I have      given him some samples of Simcor (500/20) and asked him to take 2 a      day.  We will follow up lipids in 8 weeks' time.  He should have a      coupon with this as he notes the Niaspan is quite expensive.  I      will be in touch with him once I have seen the blood work.   I have encouraged him to stay active again, but to rest prostate if he  can get back into a  routine.  Consider recumbent bike.  Consider water  exercise.   I will set to see him back in 6-8 months, sooner if problems develop.   Pricilla Riffle, MD, [facility]  Electronically Signed    PVR/MedQ  DD: 09/18/2008  DT: 09/18/2008  Job #: [id number]

## 2011-05-03 NOTE — Assessment & Plan Note (Signed)
[facility]                            CARDIOLOGY OFFICE NOTE   DAILY, CRATE                      MRN:          [id number]  DATE:06/26/2007                            DOB:          11/17/1946    IDENTIFICATION:  Mr. Dy is a 65 year old gentleman last seen by Dr.  Glennon Hamilton in clinic back in April.   He has a history of hypertension and paroxysmal atrial flutter.  He has  also a history of dyslipidemia and a strong history of CAD in the  family.   Since then, he has done okay, no shortness of breath, no chest pain.  He  is being evaluated for left knee problems; this significantly limits his  activity.  Note:  He just was fitted with a brace and he may be able to  increase his walking some.  He also has significant pain in his neck  after an injury that required surgery.   CURRENT MEDICATIONS:  1. Lipitor 10.  2. Aspirin 81.  3. Lotrel 10/40.   PHYSICAL EXAMINATION:  On exam, the patient is in no distress.  Blood pressure here in clinic is 165/80; on my check, 154/80 (at home,  patient says blood pressure is in the 130s to 140s systolic).  Pulse is  57 and regular.  Weight 262.  NECK:  Status post cervical surgery.  LUNGS:  Clear.  No rales.  CARDIAC:  Regular rate and rhythm, S1 and S2, no S3, no murmurs.  ABDOMEN:  Benign, obese.  EXTREMITIES:  No edema.   TWELVE-LEAD EKG:  Normal sinus bradycardia of 54 beats per minute.   IMPRESSION:  1. Hypertension:  High today in clinic.  He says he is checking at      home and it is okay.  He needs to get his cuff monitored though to      make sure it is calibrated okay.  I will follow up in the winter.      He will call when he calibrates it at another office if it is off.  2. Dyslipidemia:  On last lipid panel in May, triglycerides 75, HDL      32, LDL of 58, on Lipitor.  Would continue for now.  Hopefully, he      can increase his activity and improve his HDL control; if note, I      would add Niaspan to his regimen.  3. History of atrial flutter, no recurrence:  The patient senses this.  4. Healthcare maintenance:  Of note, Myoview scan done in May 2007      showed no evidence for ischemia or scar, ejection fraction of 52%,      no symptoms change.   FOLLOWUP:  Follow up in the winter, sooner if problems develop.     Pricilla Riffle, MD, [facility]  Electronically Signed    PVR/MedQ  DD: 06/26/2007  DT: 06/27/2007  Job #: [id number]   cc:   Quita Skye. Artis Flock, M.D.

## 2011-05-03 NOTE — Assessment & Plan Note (Signed)
[facility]                            CARDIOLOGY OFFICE NOTE   EASTON, FETTY                      MRN:          [id number]  DATE:01/21/2008                            DOB:          06-Jan-1946    IDENTIFICATION:  Mr. Demonte is a 65 year old gentleman who I saw back  in July.  Previously had been seen by Dr. Glennon Hamilton.  He has a history  of hypertension, dyslipidemia, paroxysmal atrial flutter.   Since seen for cardiac standpoint he has done okay.  He had his knee  replaced by Dr. Thomasena Edis and then he developed flare of his  diverticulosis.  He is actually now being seen by Dr. Michaell Cowing for possible  removal of part of his descending colon.   Breathing has been okay.  No chest pain.  No significant palpitations.  Blood pressures at home 128-140 over 50s to 60s.  He did not take as  cuff into the doctor's office.   CURRENT MEDICATIONS:  Lipitor 10, amlodipine benazepril 5/20 one p.o.  b.i.d., aspirin 81 mg daily, Align daily, Metamucil daily, and Flomax  0.4 daily.   PHYSICAL EXAM:  The patient is in no distress.  Blood pressure 151/74 pulse is 64, weight 264 which was up from 258 in  January.  Lungs are clear.  Cardiac exam regular rate and rhythm, S1-S2 no S3 no significant  murmurs.  Abdomen is benign.  I did not press the left lower quadrant.  No  hepatomegaly.  EXTREMITIES:  No edema.   IMPRESSION:  1. Hypertension better control at home.  He had taken his cuff into      the doctor's office.  I would follow for now and keep on the same      regimen.  2. Dyslipidemia.  Will have the patient when he goes for other labs      get his cholesterol checked.   Otherwise I will set to see the patient back in October of this year.   From a cardiac standpoint, I think he is low risk for major cardiac  event.  Note he had a Myoview scan done in May 2007 that showed no  significant ischemia.  He she should not need any further testing  prior  to surgery.     Pricilla Riffle, MD, [facility]  Electronically Signed    PVR/MedQ  DD: 01/21/2008  DT: 01/22/2008  Job #: [id number]   cc:   Ardeth Sportsman, MD

## 2011-05-03 NOTE — Assessment & Plan Note (Signed)
[facility]                         GASTROENTEROLOGY OFFICE NOTE   DOCK, BACCAM                      MRN:          [id number]  DATE:12/25/2007                            DOB:          1946/07/20    [name] is a 65 year old white male I have seen over the last 10  years because of recurrent episodes of diverticulitis.   [name] has recently been followed by Dr. Artis Flock.  [name] has had recurrent  diverticulitis over the last several months, confirmed by CT scan on  October 22, 2007.  [name] has had several courses of Cipro and recently  metronidazole and continues to have some left lower quadrant discomfort  and tenderness, although [name] is having regular bowel movements without  melena or hematochezia.  [name] does have BPH and also takes Flomax.  All of  his problems, [name] relates, have started since [name] had a left knee  replacement by Erasmo Leventhal, M.D., in August 2008.  At that  time [name] had rather severe postoperative constipation because of heavy  narcotic use.   [name] is referred today for repeat evaluation because of continued pressure  and discomfort in his left lower quadrant and in his rectal area.  [name]  last had colonoscopy performed in March 2003 with a very poor prep but I  could not see any abnormality except for severe sigmoid colon spasm and  some rather thickened haustral folds in the sigmoid colon.  Recent blood  work has been somewhat abnormal in September showing a hemoglobin of  around 10 with a normochromic, normocytic pattern.  I suspect this was  related to blood loss from the time of his knee surgery, but [name] also had  some thrombocytopenia with a platelet count of 150,000.  White count was  normal at 8200, as were electrolytes with a serum creatinine of 1.0.   [name] is following a regular diet at this time and denies any specific  food intolerances.  [name] denies any upper GI or hepatobiliary problems.  [name] currently continues on  amoxicillin per Dr. Artis Flock.   PAST MEDICAL HISTORY:  Remarkable for various cardiac arrhythmias and  hypertension, followed by a cardiologist.  Last cardiac note from July  2008 per Dr. Dietrich Pates shows that the patient in the past had an  incidence of atrial flutter but this has resolved.  [name] has  hyperlipidemia and hypertension and currently takes:   1. Lipitor 10 mg a day.  2. Amlodipine/benazepril 50/20 mg a day.  3. Aspirin 81 mg a day.  4. Align daily.  5. Amoxicillin.  6. [name] also uses p.r.n. Celebrex and hydrocodone.   [name] has had no further problems with his knee and seems to be getting  over his surgery at this time.   FAMILY HISTORY:  [name] has a very strong family history of coronary artery  disease and atherosclerosis but no known gastrointestinal problems.   SOCIAL HISTORY:  [name] is married and lives with his wife.  [name] has a  [employer] and works for [employer].  [name] does not smoke or use  ethanol.  REVIEW OF SYSTEMS:  Noncontributory without current cardiovascular,  pulmonary, genitourinary, neurologic, or endocrine or active orthopedic  problems.  [name] denies any chronic psychiatric or neurological  difficulties.   EXAM:  [name] is a healthy-appearing, nontoxic white male appearing his  stated age, in no acute distress.  [name] is 6 feet tall and weighs 256 pounds.  Blood pressure is 134/54,  pulse is 78 and regular.  I cannot appreciate stigmata of chronic liver disease.  His chest was clear.  [name] is in a regular rhythm without murmurs, gallops or rubs.  I cannot appreciate hepatosplenomegaly or abdominal masses.  There was  some tenderness over the sigmoid colon area to deep palpation but no  rebound tenderness, and bowel sounds were normal.  Peripheral extremities showed some rather marked swelling of the left  knee but no erythema or increased heat.  There was no evidence of  phlebitis or edema.  Inspection of the rectum was unremarkable as was rectal exam, and  stool  was guaiac-negative.   ASSESSMENT:  [name] has had recurrent diverticulitis for the last  year and probably has associated sigmoid colon segmental colitis.  I  suspect [name] will need sigmoid resection because of the persistent nature  of his difficulty.  I think it is unlikely that [name] has colon carcinoma.   RECOMMENDATIONS:  1. A high-fiber diet with daily Citrucel and liberal p.o. fluids.  2. Add Pamine Forte 5 mg twice a day.  3. Follow up colonoscopy exam.  4. Repeat labs and CBC and consider further anemia workup depending on      the blood count results.  5. Continue other medications per Drs. Dietrich Pates and [address].     Vania Rea. Jarold Motto, MD, [facility], F[facility]  Electronically Signed    DRP/MedQ  DD: 12/25/2007  DT: 12/26/2007  Job #: [id number]   cc:   Quita Skye. Artis Flock, M.D.  Pricilla Riffle, MD, [facility]

## 2011-05-04 ENCOUNTER — Telehealth: Payer: Self-pay | Admitting: Internal Medicine

## 2011-05-04 NOTE — Telephone Encounter (Signed)
Pt calling re a blood test he is to have July, pt aware it will be tomorrow

## 2011-05-05 NOTE — Telephone Encounter (Signed)
Called patient back and I reviewed lab work scheduled for 7/10. He would like to have a glucose level done. Advised will add a bmet.

## 2011-05-06 NOTE — Assessment & Plan Note (Signed)
[facility]                            CARDIOLOGY OFFICE NOTE   [name], [name]                      MRN:          [id number]  DATE:12/06/2006                            DOB:          July 11, 1946    [name] is a very pleasant 64 year old white male with hypertension,  history of demonstrative proximal atrial flutter, moderate obesity,  cervical neck injury with surgery, bilateral carpal tunnel syndrome, and  hyperlipidemia. Blood pressure has been poorly controlled in the past.  He has had no recurrent atrial flutter. He has no chest pain, shortness  of breath, or pre syncope.  He is on Lipitor 10, aspirin 81, and Lotrel 10/20.  Blood pressure is 130/77, pulse 59, normal sinus rhythm. GENERAL  APPEARANCE: Normal. JVD is not elevated. Carotid pulse is palpable and  equal without bruits. LUNGS: Clear. CARDIAC EXAM:  Normal. ABDOMINAL  EXAM: Normal. EXTREMITIES: Reveal no edema. EKG is normal.   IMPRESSION:  The patient is getting along quite well with controlled  blood pressure and lipids. We plan to recheck the basic metabolic panel  and lipid profile. See him back in 4 months or p.r.n.     E. Graceann Congress, MD, [facility]  Electronically Signed    EJL/MedQ  DD: 12/06/2006  DT: 12/06/2006  Job #: [id number]

## 2011-05-06 NOTE — Assessment & Plan Note (Signed)
[facility]                              CARDIOLOGY OFFICE NOTE   KEYSHON, STEIN                        MRN:          [id number]  DATE:09/07/2006                            DOB:          09-01-1946    [name] is a very pleasant 65 year old white male with hypertension,  history of paroxysmal atrial flutter, moderate obesity, history of cervical  neck injury with surgery, bilateral carpal tunnel syndrome, and  hyperlipidemia.  The patient was recently seen on August 01, 2006,  hypertension was poorly controlled, we changed from Norvasc to [facility] 10/20.  The patient states that blood pressure has been much better controlled since  that time, however, he has noticed some erectile dysfunction.   He is on Lipitor 10, aspirin 81, Lotrel 10/20.o   PHYSICAL EXAMINATION:  VITAL SIGNS:  Blood pressure 139/73, pulse 54, sinus bradycardia.  GENERAL:  Appearance normal.  NECK:  JVP not elevated, carotid pulses palpable without bruits.  LUNGS:  Clear.  HEART:  Normal.  ABDOMEN:  Normal.  EXTREMITIES:  Normal.   Blood pressure much better controlled, erectile dysfunction is a problem.  I  have encouraged him to continue the present regimen for another month or so.  Certainly, if he continues to have problems, we will try a different anti-  hypertensive.  He had tolerated the Norvasc alone quite well.  I will see  him back in two months or p.r.n.                              E. Graceann Congress, MD, [facility]    EJL/MedQ  DD:  09/07/2006  DT:  09/08/2006  Job #:  [id number]   cc:   Quita Skye. Artis Flock, M.D.

## 2011-05-06 NOTE — H&P (Signed)
NAMEWENDELIN, BRADT NO.:  [id number]   MEDICAL RECORD NO.:  [id number]         PATIENT TYPE:  LINP   LOCATION:                               FACILITY:  [facility]   PHYSICIAN:  Erasmo Leventhal, M.D.DATE OF BIRTH:  1946-03-25   DATE OF ADMISSION:  09/10/2007  DATE OF DISCHARGE:                              HISTORY & PHYSICAL   CHIEF COMPLAINT:  Left knee osteoarthritis.   HISTORY OF PRESENT ILLNESS:  This is a 65 year old gentleman with a  history of osteoarthritis of his left knee that has failed to respond to  conservative treatment.  He has difficulty with daily activities walking  and retractile pain.  After discussion of the treatment benefits, risks  and options, the patient is now scheduled for total knee arthroplasty of  his left knee. He has had medical clearance from Dr. Tenny Craw at about  [facility] and Dr. Artis Flock, his primary care medical doctor.  He  has also had a cervical fusion for cervical trauma in the past and the  patient states Dr. Channing Mutters has was given him medical clearance as far as  surgery is concerned.  The surgery, benefits, risks and aftercare were  discussed in detail with the patient, questions invited and answered and  surgery to go ahead as scheduled.   PAST MEDICAL HISTORY:  Drug allergies none.   CURRENT MEDICATIONS:  1. Celebrex 200 mg daily.  2. Midrin p.r.n.  3. Aspirin 81 mg daily.  4. Lotrel 10/41 daily.  5. Lipitor and a diuretic but he did not bring those dosages with him.      He will bring them to the hospital.   SERIOUS MEDICAL ILLNESSES:  History of paroxysmal atrial flutter and  hypertension.   PREVIOUS SURGERIES:  Cervical fusion and knee surgery.   SOCIAL HISTORY:  The patient is married.  He is retired.  He does not  smoke or drink.   FAMILY HISTORY:  Positive for coronary artery disease.   REVIEW OF SYSTEMS:  CENTRAL NERVOUS SYSTEM:  Positive for history of  cervical fracture with cervical fusion,  negative for headache, blurred  vision or dizziness.  PULMONARY:  Negative for shortness breath, PND and  orthopnea.  CARDIOVASCULAR:  Positive for hypertension and a history of  paroxysmal atrial fibrillation.  GI:  Positive for diverticulitis,  negative for ulcers or hepatitis.  GU:  Negative for urinary tract  difficulty. MUSCULOSKELETAL:  Positive as in HPI.   PHYSICAL EXAM:  BP 122/80, respirations 16, pulse 72 and regular.  GENERAL APPEARANCE:  This is a well-developed, well-nourished gentleman  in no acute distress.  HEENT:  Head normocephalic.  Nose patent.  Ears patent.  Pupils equal,  round, and reactive to light.  Throat without injection.  NECK:  Stiff secondary to a full level cervical fusion.  Carotids 2+  without bruit.  CHEST:  Clear to auscultation.  No rales or rhonchi.  Respirations 16.  HEART:  Regular rate and rhythm at 72 beats per minute without murmur.  ABDOMEN:  Soft with active bowel sounds.  No mass or organomegaly.  NEUROLOGIC:  Patient alert and oriented to time, place, and person.  Cranial nerves II-XII grossly intact.  EXTREMITIES:  Shows the left knee with a varus deformity.  Neurovascular  status intact.  Dorsalis pedis and posterior tibialis pulses are 2+ and  range of motion is 3 degrees to 135.  X-rays show advanced  osteoarthritis of the left knee.   IMPRESSION:  Advanced osteoarthritis left knee.   PLAN:  Total knee arthroplasty left knee.      Jaquelyn Bitter. Chabon, P.A.    ______________________________  Erasmo Leventhal, M.D.    SJC/MEDQ  D:  08/15/2007  T:  08/16/2007  Job:  [id number]

## 2011-05-06 NOTE — Letter (Signed)
August 01, 2006     Quita Skye. Artis Flock, MD  [address]  [place], [place] [address]   RE:  [name], FULL  MRN:  [id number]  /  DOB:  03-Nov-1946   Dear [name]:   It was my pleasure to see our mutual patient, [name], for followup on  08/01/2006.  As noted he is a very pleasant 65 year old white male formerly  a patient of Dr. Karma Ganja, with history of hypertension, one episode of  paroxysmal atrial flutter, strong family history of coronary artery disease  and hyperlipidemia.   The patient has had no recurrence of the atrial flutter.  He had a stress  test revealing abnormal EKG, but no perfusion defect.  2D echo revealed  normal EF with thickening of the aortic valve with EF of 65%.   Present regimen includes Norvasc 10, Lipitor 10, aspirin 81.   Blood pressure 166/86, pulse 63, normal sinus rhythm. General appearance is  normal.  JVP is not elevated.  Carotid pulses are palpable without bruits.  Lungs are clear.  Cardiac exam is normal.  Abdominal exam is normal.  Extremities: Normal.   EKG: Within normal limits.   Diagnoses: As above.  The hypertension is poorly controlled.   I suggest starting Lotrel 10/20 and discontinuing the Norvasc.  The         patient can report to [place] after 2 weeks concerning his blood pressure and we  will see him back in 6 weeks or p.r.n.   Sincerely,      E. Graceann Congress, MD, [facility]   EJL/MedQ  DD:  08/01/2006  DT:  08/02/2006  Job #:  [id number]

## 2011-05-06 NOTE — Letter (Signed)
April 17, 2007    Quita Skye. Artis Flock, M.D.  [address]  [place], [place] [address]   RE:  [name], [name]  MRN:  [id number]  /  DOB:  05-15-1946   Dear [name]:   It was a pleasure to see our mutual patient, [name], for followup  on April 17, 2007.  As you know, he is a very pleasant 65 year old white  male with a history of hypertension, and 1 episode of paroxysmal atrial  flutter, and a strong history of coronary artery disease, and  hyperlipidemia.   Patient has had no recurrence of symptoms of atrial flutter.  He has no  cardiac symptoms.   A stress Myoview performed May 16, 2006 revealed some EKG changes.  However, nuclear studies reveal no significant abnormality.  A  transthoracic echo revealed EF of 65%.  No wall motion abnormality.  Aortic valve thickness mildly increased.   MEDICATIONS:  Include:  1. Lipitor 10.  2. Aspirin 81.  3. Lotrel 10/20.   His pressures at home run 130s to 140s range.   PHYSICAL EXAMINATION:  His blood pressure is 157/76.  Pulse 60.  Normal  sinus rhythm.  GENERAL APPEARANCE:  Normal.  JVP is not elevated.  Carotid pulses palpable and equal without bruits.  LUNGS:  Clear.  CARDIAC EXAM:  Reveals no murmur or gallop.  ABDOMINAL EXAM:  Liver, spleen, and kidneys not palpable.  EXTREMITIES:  Warm without edema.  EKG:  Reveals sinus bradycardia with occasional PAC, otherwise normal.   IMPRESSION:  1. Hypertension.  Poorly controlled.  2. Hyperlipidemia.  On therapy.  3. History of a single episode of documented atrial flutter.   I have suggested the patient increase the Lotrel 10/20 and add Lotensin  20, as he has just filled his prescription yesterday.   Following this, he will be on the Lotrel 10/40.  He is to record his  blood pressures daily, and call us concerning this.   I have suggested that he see Dr. Dietrich Pates for continued followup in 3  months.   Thank you for the opportunity to share in this nice  patient's care.    Sincerely,      E. Graceann Congress, MD, [facility]  Electronically Signed    EJL/MedQ  DD: 04/17/2007  DT: 04/17/2007  Job #: [id number]

## 2011-06-08 ENCOUNTER — Encounter: Payer: Self-pay | Admitting: Internal Medicine

## 2011-06-28 ENCOUNTER — Other Ambulatory Visit: Payer: Self-pay

## 2011-07-04 ENCOUNTER — Telehealth: Payer: Self-pay | Admitting: Internal Medicine

## 2011-07-04 NOTE — Telephone Encounter (Signed)
Refills called to [facility] per patient's request.

## 2011-07-04 NOTE — Telephone Encounter (Signed)
Per pt call, Pt is at the pharmacy now waiting in line. Pt said the pharmacy has faxed [place] twice with no response.  Pt said he has tried for a week to get a RX refill. Pt said he is going to quit taking medicine b/c he cannot get a refill.

## 2011-07-04 NOTE — Telephone Encounter (Signed)
Pt needs refill of amlodipine 5 mg twice a day, benazepril 20mg  two a day , uses [facility], pt state's  pharmacy requested last week, he's going out of town this am ans out of med, needs called in asap

## 2011-07-13 ENCOUNTER — Other Ambulatory Visit (INDEPENDENT_AMBULATORY_CARE_PROVIDER_SITE_OTHER): Payer: Medicare Other

## 2011-07-13 DIAGNOSIS — E7800 Pure hypercholesterolemia, unspecified: Secondary | ICD-10-CM

## 2011-07-13 LAB — HEPATIC FUNCTION PANEL
ALT: 27 U/L (ref 0–53)
AST: 25 U/L (ref 0–37)
Bilirubin, Direct: 0.2 mg/dL (ref 0.0–0.3)
Total Bilirubin: 1 mg/dL (ref 0.3–1.2)
Total Protein: 8.3 g/dL (ref 6.0–8.3)

## 2011-07-13 LAB — LIPID PANEL
Cholesterol: 92 mg/dL (ref 0–200)
LDL Cholesterol: 49 mg/dL (ref 0–99)
Total CHOL/HDL Ratio: 3
Triglycerides: 48 mg/dL (ref 0.0–149.0)

## 2011-07-13 LAB — BASIC METABOLIC PANEL
CO2: 27 mEq/L (ref 19–32)
Chloride: 104 mEq/L (ref 96–112)
Creatinine, Ser: 1.1 mg/dL (ref 0.4–1.5)
Potassium: 4.2 mEq/L (ref 3.5–5.1)

## 2011-07-19 ENCOUNTER — Encounter: Payer: Self-pay | Admitting: Internal Medicine

## 2011-07-25 ENCOUNTER — Ambulatory Visit: Payer: Medicare Other

## 2011-07-25 ENCOUNTER — Ambulatory Visit (INDEPENDENT_AMBULATORY_CARE_PROVIDER_SITE_OTHER): Payer: Medicare Other | Admitting: Internal Medicine

## 2011-07-25 ENCOUNTER — Encounter: Payer: Self-pay | Admitting: Internal Medicine

## 2011-07-25 VITALS — BP 138/76 | HR 57 | Resp 16 | Ht 71.0 in | Wt 239.0 lb

## 2011-07-25 DIAGNOSIS — J029 Acute pharyngitis, unspecified: Secondary | ICD-10-CM

## 2011-07-25 DIAGNOSIS — I4892 Unspecified atrial flutter: Secondary | ICD-10-CM

## 2011-07-25 DIAGNOSIS — I10 Essential (primary) hypertension: Secondary | ICD-10-CM

## 2011-07-25 DIAGNOSIS — E785 Hyperlipidemia, unspecified: Secondary | ICD-10-CM

## 2011-07-25 NOTE — Patient Instructions (Signed)
Your physician wants you to follow-up in:  6 months. You will receive a reminder letter in the mail two months in advance. If you don't receive a letter, please call our office to schedule the follow-up appointment.   

## 2011-07-26 DIAGNOSIS — J029 Acute pharyngitis, unspecified: Secondary | ICD-10-CM | POA: Insufficient documentation

## 2011-07-26 NOTE — Assessment & Plan Note (Signed)
I set the patient up for a strep swab if his sore throat persists.

## 2011-07-26 NOTE — Progress Notes (Signed)
HPI  Patient is a 65 year old with a history of paroxysmal atrial flutter, HTN and dyslipidemia. Since I saw him last he has lost about 40% by cutting out bread, corn, potatoes He denies palpitations.  Breathing is good.  No chest pain. He was in the ER with his grandson last week (child broke arm).  Patient says a few days later he developed a sore throat. Denies fever.   Cough productive of mild yellow sputum   Allergies  Allergen Reactions  . Niacin     REACTION: causes diarreha    Current Outpatient Prescriptions  Medication Sig Dispense Refill  . amLODipine (NORVASC) 5 MG tablet Take 5 mg by mouth 2 (two) times daily.        Marland Kitchen aspirin 81 MG tablet Take 81 mg by mouth daily.        . benazepril (LOTENSIN) 20 MG tablet Take 20 mg by mouth 2 (two) times daily.        . celecoxib (CELEBREX) 200 MG capsule Take 200 mg by mouth as needed.        . hydrochlorothiazide (,MICROZIDE/HYDRODIURIL,) 12.5 MG capsule Take 12.5 mg by mouth daily.        . hydrocodone-acetaminophen (LORCET-HD) 5-500 MG per capsule Take 1 capsule by mouth as needed.        . Omega-3 Fatty Acids (FISH OIL DOUBLE STRENGTH) 1200 MG CAPS Take 1 capsule by mouth daily.        . simvastatin (ZOCOR) 40 MG tablet Take 20 mg by mouth at bedtime.          Past Medical History  Diagnosis Date  . Paroxysmal atrial flutter   . Dyslipidemia   . HTN (hypertension)     Past Surgical History  Procedure Date  . Cervical fusion   . Knee surgery     Family History  Problem Relation Age of Onset  . Coronary artery disease      History   Social History  . Marital Status: Married    Spouse Name: N/A    Number of Children: N/A  . Years of Education: N/A   Occupational History  . Retired    Social History Main Topics  . Smoking status: Never Smoker   . Smokeless tobacco: Not on file  . Alcohol Use: No  . Drug Use: Not on file  . Sexually Active: Not on file   Other Topics Concern  . Not on file   Social  History Narrative  . No narrative on file    Review of Systems:  All systems reviewed.  They are negative to the above problem except as previously stated.  Vital Signs: BP 138/76  Pulse 57  Resp 16  Ht 5\' 11"  (1.803 m)  Wt 239 lb (108.41 kg)  BMI 33.33 kg/m2  Physical Exam  HEENT:  Normocephalic, atraumatic. EOMI, PERRLA. Throat:  Mild erythema.  No exudate.  Neck: JVP is normal. No thyromegaly. No bruits.  Lungs: clear to auscultation. No rales no wheezes.  Heart: Regular rate and rhythm. Normal S1, S2. No S3.   No significant murmurs. PMI not displaced.  Abdomen:  Supple, nontender. Normal bowel sounds. No masses. No hepatomegaly.  Extremities:   Good distal pulses throughout. No lower extremity edema.  Musculoskeletal :moving all extremities.  Neuro:   alert and oriented x3.  CN II-XII grossly intact.  EKG:  Sinus bradycardia.  55 bpm.   Assessment and Plan:

## 2011-07-26 NOTE — Assessment & Plan Note (Signed)
Lipids are excellent with wt loss.  Can cut Zocor to 1/4 tab.

## 2011-07-26 NOTE — Assessment & Plan Note (Signed)
BP is better at home  (120s/).  No change.

## 2011-07-26 NOTE — Assessment & Plan Note (Signed)
Remains asymptomatic.  I would keep on same regimen.

## 2011-09-12 LAB — AST: AST: 25

## 2011-09-12 LAB — BASIC METABOLIC PANEL
BUN: 18
CO2: 26
Chloride: 107
Creatinine, Ser: 0.93
Glucose, Bld: 106 — ABNORMAL HIGH
Potassium: 4.2

## 2011-09-12 LAB — LIPID PANEL
Cholesterol: 95
HDL: 24 — ABNORMAL LOW
LDL Cholesterol: 52
Triglycerides: 97

## 2011-09-13 LAB — CBC
HCT: 31.6 — ABNORMAL LOW
HCT: 33.1 — ABNORMAL LOW
Hemoglobin: 11.1 — ABNORMAL LOW
Hemoglobin: 11.6 — ABNORMAL LOW
MCHC: 35.1
MCHC: 35.3
MCV: 94.7
MCV: 94.8
Platelets: 131 — ABNORMAL LOW
Platelets: 158
RDW: 14.3
RDW: 14.6
RDW: 14.6
WBC: 8

## 2011-09-13 LAB — POTASSIUM: Potassium: 4.2

## 2011-09-29 LAB — DIFFERENTIAL
Basophils Absolute: 0
Eosinophils Relative: 5
Lymphocytes Relative: 21
Monocytes Absolute: 0.8 — ABNORMAL HIGH
Monocytes Relative: 10

## 2011-09-29 LAB — CBC
HCT: 26.9 — ABNORMAL LOW
HCT: 28.1 — ABNORMAL LOW
HCT: 32.7 — ABNORMAL LOW
Hemoglobin: 11.6 — ABNORMAL LOW
Hemoglobin: 9.5 — ABNORMAL LOW
Hemoglobin: 9.9 — ABNORMAL LOW
MCHC: 35.5
Platelets: 122 — ABNORMAL LOW
Platelets: 137 — ABNORMAL LOW
RDW: 13.1
RDW: 13.3
RDW: 13.4
WBC: 7.9

## 2011-09-29 LAB — BASIC METABOLIC PANEL
BUN: 13
CO2: 29
Calcium: 8.6
Calcium: 8.7
GFR calc non Af Amer: >60
GFR calc non Af Amer: >60
Glucose, Bld: 117 — ABNORMAL HIGH
Glucose, Bld: 138 — ABNORMAL HIGH
Potassium: 4.6
Potassium: 4.8
Sodium: 136
Sodium: 138

## 2011-09-29 LAB — TYPE AND SCREEN: Antibody Screen: NEGATIVE

## 2011-09-30 LAB — CBC
HCT: 41.4
MCHC: 34.5
MCV: 96.2
Platelets: 188
RDW: 13.5

## 2011-09-30 LAB — DIFFERENTIAL
Lymphocytes Relative: 26
Lymphs Abs: 1.9
Monocytes Absolute: 0.7
Monocytes Relative: 10
Neutro Abs: 4.4

## 2011-09-30 LAB — URINALYSIS, ROUTINE W REFLEX MICROSCOPIC
Nitrite: NEGATIVE
Specific Gravity, Urine: 1.026
Urobilinogen, UA: 0.2
pH: 5.5

## 2011-09-30 LAB — COMPREHENSIVE METABOLIC PANEL
Albumin: 4.4
BUN: 20
Calcium: 10.1
Creatinine, Ser: 1.14
Potassium: 4.3
Total Protein: 8.1

## 2011-09-30 LAB — APTT: aPTT: 28

## 2011-09-30 LAB — PROTIME-INR: INR: 1

## 2011-12-06 ENCOUNTER — Other Ambulatory Visit: Payer: Self-pay | Admitting: Neurosurgery

## 2011-12-06 DIAGNOSIS — M545 Low back pain: Secondary | ICD-10-CM

## 2011-12-07 ENCOUNTER — Ambulatory Visit
Admission: RE | Admit: 2011-12-07 | Discharge: 2011-12-07 | Disposition: A | Discharge status: Home / Self Care | Payer: Medicare Other | Source: Ambulatory Visit | Attending: Neurosurgery | Admitting: Neurosurgery

## 2011-12-07 DIAGNOSIS — M545 Low back pain: Secondary | ICD-10-CM

## 2012-01-30 ENCOUNTER — Ambulatory Visit (INDEPENDENT_AMBULATORY_CARE_PROVIDER_SITE_OTHER): Payer: Medicare Other | Admitting: Internal Medicine

## 2012-01-30 ENCOUNTER — Encounter: Payer: Self-pay | Admitting: Internal Medicine

## 2012-01-30 DIAGNOSIS — I4892 Unspecified atrial flutter: Secondary | ICD-10-CM

## 2012-01-30 DIAGNOSIS — I10 Essential (primary) hypertension: Secondary | ICD-10-CM | POA: Diagnosis not present

## 2012-01-30 DIAGNOSIS — E785 Hyperlipidemia, unspecified: Secondary | ICD-10-CM

## 2012-01-30 NOTE — Patient Instructions (Signed)
Your physician wants you to follow-up in: October 2013 You will receive a reminder letter in the mail two months in advance. If you don't receive a letter, please call our office to schedule the follow-up appointment.  

## 2012-01-30 NOTE — Progress Notes (Signed)
HPI Patient is a 66 year old with a history of paroxysmal atrial flutter, HTN and dyslipidemia.  I saw him in August. Since seen he has done well from a cardiac stanpoint.  No signif palpitations.  Breathing is OK  No CP.  Is fairly active. Plans to hav surgery on back in 1 month. Still watching diet, maintaining weight. Allergies  Allergen Reactions  . Niacin     REACTION: causes diarreha    Current Outpatient Prescriptions  Medication Sig Dispense Refill  . amLODipine (NORVASC) 5 MG tablet Take 5 mg by mouth 2 (two) times daily.        Marland Kitchen aspirin 81 MG tablet Take 81 mg by mouth daily.        . benazepril (LOTENSIN) 20 MG tablet Take 20 mg by mouth 2 (two) times daily.        . celecoxib (CELEBREX) 200 MG capsule Take 200 mg by mouth as needed.        . hydrochlorothiazide (,MICROZIDE/HYDRODIURIL,) 12.5 MG capsule Take 12.5 mg by mouth daily.        . hydrocodone-acetaminophen (LORCET-HD) 5-500 MG per capsule Take 1 capsule by mouth as needed.        . Omega-3 Fatty Acids (FISH OIL DOUBLE STRENGTH) 1200 MG CAPS Take 1 capsule by mouth daily.        . simvastatin (ZOCOR) 40 MG tablet Pt breaks pill into 4 pieces he only takes  1 piece a day        Past Medical History  Diagnosis Date  . Paroxysmal atrial flutter   . Dyslipidemia   . HTN (hypertension)     Past Surgical History  Procedure Date  . Cervical fusion   . Knee surgery     Family History  Problem Relation Age of Onset  . Coronary artery disease      History   Social History  . Marital Status: Married    Spouse Name: N/A    Number of Children: N/A  . Years of Education: N/A   Occupational History  . Retired    Social History Main Topics  . Smoking status: Never Smoker   . Smokeless tobacco: Not on file  . Alcohol Use: No  . Drug Use: Not on file  . Sexually Active: Not on file   Other Topics Concern  . Not on file   Social History Narrative  . No narrative on file    Review of Systems:  All  systems reviewed.  They are negative to the above problem except as previously stated.  Vital Signs: BP 110/70  Pulse 63  Ht 6' (1.829 m)  Wt 238 lb (107.956 kg)  BMI 32.28 kg/m2  Physical Exam  Patient is in NAD. HEENT:  Normocephalic, atraumatic. EOMI, PERRLA.  Neck: JVP is normal. No thyromegaly. No bruits.  Lungs: clear to auscultation. No rales no wheezes.  Heart: Regular rate and rhythm. Normal S1, S2. No S3.   No significant murmurs. PMI not displaced.  Abdomen:  Supple, nontender. Normal bowel sounds. No masses. No hepatomegaly.  Extremities:   Good distal pulses throughout. No lower extremity edema.  Musculoskeletal :moving all extremities.  Neuro:   alert and oriented x3.  CN II-XII grossly intact.   Assessment and Plan:

## 2012-01-31 NOTE — Assessment & Plan Note (Signed)
Exam consistent with SR.  Continue to follow  NO change.  Continue ASA.  Can stop prior to surgery.  From a cardiac standpoint he is a low risk for major event.  OK to proceed.

## 2012-01-31 NOTE — Assessment & Plan Note (Signed)
Keep on statin. 

## 2012-01-31 NOTE — Assessment & Plan Note (Signed)
Good control on meds 

## 2012-02-10 DIAGNOSIS — R7309 Other abnormal glucose: Secondary | ICD-10-CM | POA: Diagnosis not present

## 2012-02-10 DIAGNOSIS — Z0000 Encounter for general adult medical examination without abnormal findings: Secondary | ICD-10-CM | POA: Diagnosis not present

## 2012-02-10 DIAGNOSIS — Z1331 Encounter for screening for depression: Secondary | ICD-10-CM | POA: Diagnosis not present

## 2012-02-10 DIAGNOSIS — E782 Mixed hyperlipidemia: Secondary | ICD-10-CM | POA: Diagnosis not present

## 2012-02-10 DIAGNOSIS — Z125 Encounter for screening for malignant neoplasm of prostate: Secondary | ICD-10-CM | POA: Diagnosis not present

## 2012-02-10 DIAGNOSIS — M1990 Unspecified osteoarthritis, unspecified site: Secondary | ICD-10-CM | POA: Diagnosis not present

## 2012-02-10 DIAGNOSIS — I10 Essential (primary) hypertension: Secondary | ICD-10-CM | POA: Diagnosis not present

## 2012-02-17 DIAGNOSIS — E782 Mixed hyperlipidemia: Secondary | ICD-10-CM | POA: Diagnosis not present

## 2012-02-17 DIAGNOSIS — M1990 Unspecified osteoarthritis, unspecified site: Secondary | ICD-10-CM | POA: Diagnosis not present

## 2012-02-17 DIAGNOSIS — I10 Essential (primary) hypertension: Secondary | ICD-10-CM | POA: Diagnosis not present

## 2012-02-17 DIAGNOSIS — R51 Headache: Secondary | ICD-10-CM | POA: Diagnosis not present

## 2012-02-27 ENCOUNTER — Other Ambulatory Visit: Payer: Self-pay

## 2012-02-27 ENCOUNTER — Telehealth: Payer: Self-pay | Admitting: Internal Medicine

## 2012-02-27 MED ORDER — SIMVASTATIN 40 MG PO TABS: 40.0000 mg | ORAL_TABLET | Freq: Every day | ORAL | Status: DC

## 2012-02-27 NOTE — Telephone Encounter (Signed)
LOv,Holter,Echo,12 faxed to [facility]  @ [phone] 02/27/12/km

## 2012-02-28 NOTE — Telephone Encounter (Addendum)
Faxed most recent office note and EKG to [facility]. [phone] 3/11 emg

## 2012-02-29 DIAGNOSIS — Z79899 Other long term (current) drug therapy: Secondary | ICD-10-CM | POA: Diagnosis not present

## 2012-02-29 DIAGNOSIS — Z7982 Long term (current) use of aspirin: Secondary | ICD-10-CM | POA: Diagnosis not present

## 2012-02-29 DIAGNOSIS — M47817 Spondylosis without myelopathy or radiculopathy, lumbosacral region: Secondary | ICD-10-CM | POA: Diagnosis not present

## 2012-02-29 DIAGNOSIS — M7130 Other bursal cyst, unspecified site: Secondary | ICD-10-CM | POA: Diagnosis not present

## 2012-02-29 DIAGNOSIS — I10 Essential (primary) hypertension: Secondary | ICD-10-CM | POA: Diagnosis not present

## 2012-03-02 ENCOUNTER — Other Ambulatory Visit: Payer: Self-pay | Admitting: Internal Medicine

## 2012-03-02 MED ORDER — SIMVASTATIN 40 MG PO TABS: 40.0000 mg | ORAL_TABLET | Freq: Every day | ORAL | Status: DC

## 2012-03-02 NOTE — Telephone Encounter (Signed)
Pt is out of pills 

## 2012-03-06 ENCOUNTER — Other Ambulatory Visit: Payer: Self-pay | Admitting: Internal Medicine

## 2012-03-06 MED ORDER — AMLODIPINE BESYLATE 5 MG PO TABS: 5.0000 mg | ORAL_TABLET | Freq: Two times a day (BID) | ORAL | Status: DC

## 2012-03-06 MED ORDER — BENAZEPRIL HCL 20 MG PO TABS: 20.0000 mg | ORAL_TABLET | Freq: Two times a day (BID) | ORAL | Status: DC

## 2012-03-06 NOTE — Telephone Encounter (Signed)
Pharm is correct and they need a 30 day supply

## 2012-03-12 ENCOUNTER — Encounter: Payer: Self-pay | Admitting: Gastroenterology

## 2012-08-10 ENCOUNTER — Telehealth: Payer: Self-pay | Admitting: Internal Medicine

## 2012-08-10 DIAGNOSIS — E782 Mixed hyperlipidemia: Secondary | ICD-10-CM

## 2012-08-10 NOTE — Telephone Encounter (Signed)
New Problem:    Patient called in because he has stopped taking his Omega-3 Fatty Acids (FISH OIL DOUBLE STRENGTH) 1200 MG CAPS due to the risk of prostate cancer.  Please call back.

## 2012-08-10 NOTE — Telephone Encounter (Signed)
Called patient back. He had read an article a few weeks ago that fish oil causes a 40% increased risk of prostate cancer so he stopped taking it. Has not had a lipid panel since 1 year ago July. Has appointment to see Dr.Ross first week of November. Advised him to have fasting lipid panel with ast end of October at the [facility]. Office. Will let Dr.Ross know that he called.

## 2012-09-07 DIAGNOSIS — J40 Bronchitis, not specified as acute or chronic: Secondary | ICD-10-CM | POA: Diagnosis not present

## 2012-09-26 DIAGNOSIS — Z23 Encounter for immunization: Secondary | ICD-10-CM | POA: Diagnosis not present

## 2012-10-09 ENCOUNTER — Encounter: Payer: Self-pay | Admitting: Gastroenterology

## 2012-10-18 ENCOUNTER — Other Ambulatory Visit: Payer: Self-pay

## 2012-10-18 ENCOUNTER — Other Ambulatory Visit: Payer: Medicare Other

## 2012-10-18 MED ORDER — AMLODIPINE BESYLATE 5 MG PO TABS: 5.0000 mg | ORAL_TABLET | Freq: Two times a day (BID) | ORAL | Status: DC

## 2012-10-18 MED ORDER — BENAZEPRIL HCL 20 MG PO TABS: 20.0000 mg | ORAL_TABLET | Freq: Two times a day (BID) | ORAL | Status: DC

## 2012-10-24 ENCOUNTER — Other Ambulatory Visit (INDEPENDENT_AMBULATORY_CARE_PROVIDER_SITE_OTHER): Payer: Medicare Other

## 2012-10-24 DIAGNOSIS — E782 Mixed hyperlipidemia: Secondary | ICD-10-CM

## 2012-10-24 LAB — LIPID PANEL
Cholesterol: 107 mg/dL (ref 0–200)
HDL: 30 mg/dL — ABNORMAL LOW (ref >39.00)
LDL Cholesterol: 63 mg/dL (ref 0–99)
Triglycerides: 72 mg/dL (ref 0.0–149.0)

## 2012-10-26 ENCOUNTER — Ambulatory Visit (INDEPENDENT_AMBULATORY_CARE_PROVIDER_SITE_OTHER): Payer: Medicare Other | Admitting: Internal Medicine

## 2012-10-26 ENCOUNTER — Encounter: Payer: Self-pay | Admitting: Internal Medicine

## 2012-10-26 VITALS — BP 149/66 | HR 54 | Ht 71.0 in | Wt 235.0 lb

## 2012-10-26 DIAGNOSIS — I4892 Unspecified atrial flutter: Secondary | ICD-10-CM

## 2012-10-26 DIAGNOSIS — I4891 Unspecified atrial fibrillation: Secondary | ICD-10-CM

## 2012-10-26 DIAGNOSIS — I480 Paroxysmal atrial fibrillation: Secondary | ICD-10-CM

## 2012-10-26 NOTE — Progress Notes (Signed)
HPI Patient is a 66 year old with a history of parox atrial flutter, HTN, HL.  I saw him in Feb. Lipid panel on ll/6 showed HDL of 30, LDL of 63  Denies palpitations.  No CP  No SOB Not that active.  Limited by orthopedic problems. Allergies  Allergen Reactions  . Niacin     REACTION: causes diarreha    Current Outpatient Prescriptions  Medication Sig Dispense Refill  . amLODipine (NORVASC) 5 MG tablet Take 1 tablet (5 mg total) by mouth 2 (two) times daily.  60 tablet  6  . aspirin 81 MG tablet Take 81 mg by mouth daily.        . benazepril (LOTENSIN) 20 MG tablet Take 1 tablet (20 mg total) by mouth 2 (two) times daily.  60 tablet  6  . celecoxib (CELEBREX) 200 MG capsule Take 200 mg by mouth as needed.        . hydrochlorothiazide (,MICROZIDE/HYDRODIURIL,) 12.5 MG capsule Take 12.5 mg by mouth daily.        . hydrocodone-acetaminophen (LORCET-HD) 5-500 MG per capsule Take 1 capsule by mouth as needed.        Marland Kitchen oxyCODONE-acetaminophen (PERCOCET) 10-325 MG per tablet As needed      . simvastatin (ZOCOR) 40 MG tablet Take 1 tablet (40 mg total) by mouth at bedtime. Pt breaks pill into 4 pieces he only takes  1 piece a day  30 tablet  6    Past Medical History  Diagnosis Date  . Paroxysmal atrial flutter   . Dyslipidemia   . HTN (hypertension)     Past Surgical History  Procedure Date  . Cervical fusion   . Knee surgery     Family History  Problem Relation Age of Onset  . Coronary artery disease      History   Social History  . Marital Status: Married    Spouse Name: N/A    Number of Children: N/A  . Years of Education: N/A   Occupational History  . Retired    Social History Main Topics  . Smoking status: Never Smoker   . Smokeless tobacco: Not on file  . Alcohol Use: No  . Drug Use: Not on file  . Sexually Active: Not on file   Other Topics Concern  . Not on file   Social History Narrative  . No narrative on file    Review of Systems:  All systems  reviewed.  They are negative to the above problem except as previously stated.  Vital Signs: BP 149/66  Pulse 54  Ht 5\' 11"  (1.803 m)  Wt 235 lb (106.595 kg)  BMI 32.78 kg/m2  SpO2 100%  Physical Exam Patient is in NAD HEENT:  Normocephalic, atraumatic. EOMI, PERRLA.  Neck: JVP is normal.  No bruits.  Lungs: clear to auscultation. No rales no wheezes.  Heart: Regular rate and rhythm. Normal S1, S2. No S3.   No significant murmurs. PMI not displaced.  Abdomen:  Supple, nontender. Normal bowel sounds. No masses. No hepatomegaly.  Extremities:   Good distal pulses throughout. No lower extremity edema.  Musculoskeletal :moving all extremities.  Neuro:   alert and oriented x3.  CN II-XII grossly intact.  EKG:  SB  54 bpm.    Assessment and Plan:  1. Atrial flutter.  Has had only one episode, around 2007.  Went to ER  Converted to SR after adenosine per his report.  No recurrence since.    2.  HTN  He says it is under better control usually  3.  HL  Excellent control  Patient is very worried about vasc disease given his famil hsitory.  F/U next summer.  Increase activity as able.

## 2012-10-26 NOTE — Patient Instructions (Signed)
Your physician wants you to follow-up in:  6 months. You will receive a reminder letter in the mail two months in advance. If you don't receive a letter, please call our office to schedule the follow-up appointment.   

## 2012-11-01 ENCOUNTER — Encounter: Payer: Self-pay | Admitting: Gastroenterology

## 2012-11-09 ENCOUNTER — Ambulatory Visit (AMBULATORY_SURGERY_CENTER): Payer: Medicare Other | Admitting: *Deleted

## 2012-11-09 VITALS — Ht 71.0 in | Wt 243.2 lb

## 2012-11-09 DIAGNOSIS — Z1211 Encounter for screening for malignant neoplasm of colon: Secondary | ICD-10-CM

## 2012-11-09 MED ORDER — MOVIPREP 100 G PO SOLR: ORAL | Status: DC

## 2012-11-09 NOTE — Progress Notes (Signed)
Pt here for PV for colonoscopy scheduled for 12/2 with Dr. Jarold Motto.  Per Dr. Norval Gable recall sheet: pt needs OV first.  New pt appt. Scheduled for Tues 11/27 at 9:00.  Pt was given instructions for procedure in PV and Rx for MoviPrep was sent to pharmacy.  Also; pt says that he had cervical fusion and it is hard for him to tilt his head back for intubation.  He was told to let anyone know this before any type of surgery.

## 2012-11-13 ENCOUNTER — Ambulatory Visit (INDEPENDENT_AMBULATORY_CARE_PROVIDER_SITE_OTHER): Payer: Medicare Other | Admitting: Gastroenterology

## 2012-11-13 ENCOUNTER — Encounter: Payer: Self-pay | Admitting: Gastroenterology

## 2012-11-13 VITALS — BP 132/64 | HR 61 | Ht 70.0 in | Wt 240.1 lb

## 2012-11-13 DIAGNOSIS — Z9049 Acquired absence of other specified parts of digestive tract: Secondary | ICD-10-CM

## 2012-11-13 DIAGNOSIS — Z9889 Other specified postprocedural states: Secondary | ICD-10-CM

## 2012-11-13 DIAGNOSIS — K644 Residual hemorrhoidal skin tags: Secondary | ICD-10-CM | POA: Diagnosis not present

## 2012-11-13 DIAGNOSIS — Z860100 Personal history of colon polyps, unspecified: Secondary | ICD-10-CM

## 2012-11-13 DIAGNOSIS — K5730 Diverticulosis of large intestine without perforation or abscess without bleeding: Secondary | ICD-10-CM | POA: Diagnosis not present

## 2012-11-13 MED ORDER — HYDROCORTISONE ACE-PRAMOXINE 2.5-1 % RE CREA: TOPICAL_CREAM | RECTAL | Status: DC | PRN

## 2012-11-13 NOTE — Patient Instructions (Addendum)
Please keep your scheduled appointment for colonoscopy   We have sent the following medications to your pharmacy for you to pick up at your convenience: Analpram. Please take as needed

## 2012-11-13 NOTE — Progress Notes (Signed)
History of Present Illness:  This is a extremely pleasant 66 year old Caucasian male status post laparoscopic sigmoid resection 5 years ago by Dr. Star Age.  He currently has no GI complaints except for occasional discomfort in bleeding.  He has 1-2 soft regular bowel movements a day without melena.  He denies abdominal pain, upper GI or hepatobiliary complaints.  He is followed by cardiology for his hypertension, and also status post fusion of the cervical spine.  Lab review last year showed normal CBC a metabolic profile.  Family history is noncontributory.  Previous colonoscopy 5 years ago was remarkable for hyperplastic polyps.  I have reviewed this patient's present history, medical and surgical past history, allergies and medications.     ROS: Is not on anticoagulants except for aspirin 81 mg.     Physical Exam: Blood pressure 132/64, pulse 61 and regular, and weight 240 pounds with a BMI of 34.45.  Resting oxygen saturation 96%. General well developed well nourished patient in no acute distress, appearing their stated age Eyes PERRLA, no icterus, fundoscopic exam per opthamologist Skin no lesions noted Neck supple, no adenopathy, no thyroid enlargement, no tenderness Chest clear to percussion and auscultation Heart no significant murmurs, gallops or rubs noted Abdomen no hepatosplenomegaly masses or tenderness, BS normal.  Rectal inspection normal no fissures, or fistulae noted.  Small posterior noninflamed external hemorrhoid noted.  Extremities no acute joint lesions, edema, phlebitis or evidence of cellulitis. Neurologic patient oriented x 3, cranial nerves intact, no focal neurologic deficits noted. Psychological mental status normal and normal affect.  Assessment and plan: Followup colonoscopy is scheduled for next week.  I reviewed the risk and benefits of this procedure, and he is agreed to proceed as planned.  This patient is doing extremely well from a gastrointestinal  standpoint.  Have given him some Analpram cream to use on a when necessary basis, encourage continued high-fiber diet use.  His continue his other medications as listed and reviewed. No diagnosis found.   Please Copy Dr. Karie Soda at [facility] and his primary care physician.

## 2012-11-19 ENCOUNTER — Ambulatory Visit (AMBULATORY_SURGERY_CENTER): Payer: Medicare Other | Admitting: Gastroenterology

## 2012-11-19 ENCOUNTER — Encounter: Payer: Self-pay | Admitting: Gastroenterology

## 2012-11-19 VITALS — BP 119/62 | HR 57 | Temp 96.7°F | Resp 28 | Ht 70.0 in | Wt 240.0 lb

## 2012-11-19 DIAGNOSIS — Z860100 Personal history of colon polyps, unspecified: Secondary | ICD-10-CM

## 2012-11-19 DIAGNOSIS — I4892 Unspecified atrial flutter: Secondary | ICD-10-CM | POA: Diagnosis not present

## 2012-11-19 DIAGNOSIS — Z1211 Encounter for screening for malignant neoplasm of colon: Secondary | ICD-10-CM

## 2012-11-19 DIAGNOSIS — I10 Essential (primary) hypertension: Secondary | ICD-10-CM | POA: Diagnosis not present

## 2012-11-19 MED ORDER — SODIUM CHLORIDE 0.9 % IV SOLN: 500.0000 mL | INTRAVENOUS | Status: DC

## 2012-11-19 NOTE — Op Note (Signed)
[facility] [address] [place], [address]   COLONOSCOPY PROCEDURE REPORT  PATIENT: Tony, [name]  MR#: [id number] BIRTHDATE: 1946/04/13 , 66  yrs. old GENDER: Male ENDOSCOPIST: Mardella Layman, MD, [facility] REFERRED BY: PROCEDURE DATE:  11/19/2012 PROCEDURE:   Colonoscopy, surveillance ASA CLASS:   Class II INDICATIONS:Patient's personal history of adenomatous colon polyps.  MEDICATIONS: propofol (Diprivan) 200mg  IV  DESCRIPTION OF PROCEDURE:   After the risks and benefits and of the procedure were explained, informed consent was obtained.  A digital rectal exam revealed no abnormalities of the rectum.    The LB [id number]  endoscope was introduced through the anus and advanced to the terminal ileum which was intubated for a short distance .  The quality of the prep was excellent, using MoviPrep . The instrument was then slowly withdrawn as the colon was fully examined.     COLON FINDINGS: There was evidence of a prior end-to-end colorectal surgical anastomosis.   A normal appearing cecum, ileocecal valve, and appendiceal orifice were identified. A nodular ileum was noted. The ascending, hepatic flexure, transverse, splenic flexure, descending, sigmoid colon and rectum appeared unremarkable.Small scattered tics noted.  No polyps or cancers were seen. Retroflexed views revealed no abnormalities.     The scope was then withdrawn from the patient and the procedure completed.  COMPLICATIONS: There were no complications. ENDOSCOPIC IMPRESSION: 1.   There was evidence of a prior colorectal surgical anastomosis 2.   Normal colon otherwise,no polyps  RECOMMENDATIONS: Continue current colorectal screening recommendations for "routine risk" patients with a repeat colonoscopy in 10 years.   REPEAT EXAM:  NF:AOZHYQ, Karrar MD  _______________________________ eSigned:  Mardella Layman, MD, [facility] 11/19/2012 8:48 AM     PATIENT NAME:  Tony, [name] MR#: [id number]

## 2012-11-19 NOTE — Patient Instructions (Addendum)
Normal colonoscopy   YOU HAD AN ENDOSCOPIC PROCEDURE TODAY AT THE [facility]: Refer to the procedure report that was given to you for any specific questions about what was found during the examination.  If the procedure report does not answer your questions, please call your gastroenterologist to clarify.  If you requested that your care partner not be given the details of your procedure findings, then the procedure report has been included in a sealed envelope for you to review at your convenience later.  YOU SHOULD EXPECT: Some feelings of bloating in the abdomen. Passage of more gas than usual.  Walking can help get rid of the air that was put into your GI tract during the procedure and reduce the bloating. If you had a lower endoscopy (such as a colonoscopy or flexible sigmoidoscopy) you may notice spotting of blood in your stool or on the toilet paper. If you underwent a bowel prep for your procedure, then you may not have a normal bowel movement for a few days.  DIET: Your first meal following the procedure should be a light meal and then it is ok to progress to your normal diet.  A half-sandwich or bowl of soup is an example of a good first meal.  Heavy or fried foods are harder to digest and may make you feel nauseous or bloated.  Likewise meals heavy in dairy and vegetables can cause extra gas to form and this can also increase the bloating.  Drink plenty of fluids but you should avoid alcoholic beverages for 24 hours.  ACTIVITY: Your care partner should take you home directly after the procedure.  You should plan to take it easy, moving slowly for the rest of the day.  You can resume normal activity the day after the procedure however you should NOT DRIVE or use heavy machinery for 24 hours (because of the sedation medicines used during the test).    SYMPTOMS TO REPORT IMMEDIATELY: A gastroenterologist can be reached at any hour.  During normal business hours, 8:30 AM to 5:00 PM  Monday through Friday, call [phone].  After hours and on weekends, please call the GI answering service at [phone] who will take a message and have the physician on call contact you.   Following lower endoscopy (colonoscopy or flexible sigmoidoscopy):  Excessive amounts of blood in the stool  Significant tenderness or worsening of abdominal pains  Swelling of the abdomen that is new, acute  Fever of 100F or higher   FOLLOW UP: If any biopsies were taken you will be contacted by phone or by letter within the next 1-3 weeks.  Call your gastroenterologist if you have not heard about the biopsies in 3 weeks.  Our staff will call the home number listed on your records the next business day following your procedure to check on you and address any questions or concerns that you may have at that time regarding the information given to you following your procedure. This is a courtesy call and so if there is no answer at the home number and we have not heard from you through the emergency physician on call, we will assume that you have returned to your regular daily activities without incident.  SIGNATURES/CONFIDENTIALITY: You and/or your care partner have signed paperwork which will be entered into your electronic medical record.  These signatures attest to the fact that that the information above on your After Visit Summary has been reviewed and is understood.  Full responsibility   of the confidentiality of this discharge information lies with you and/or your care-partner.  

## 2012-11-19 NOTE — Progress Notes (Signed)
Patient did not experience any of the following events: a burn prior to discharge; a fall within the facility; wrong site/side/patient/procedure/implant event; or a hospital transfer or hospital admission upon discharge from the facility. (G8907) Patient did not have preoperative order for IV antibiotic SSI prophylaxis. (G8918)  

## 2012-11-20 ENCOUNTER — Telehealth: Payer: Self-pay | Admitting: *Deleted

## 2012-11-20 NOTE — Telephone Encounter (Signed)
  Follow up Call-  Call back number 11/19/2012  Post procedure Call Back phone  # [phone]  Permission to leave phone message Yes     Patient questions:  Do you have a fever, pain , or abdominal swelling? no Pain Score  0 *  Have you tolerated food without any problems? yes  Have you been able to return to your normal activities? yes  Do you have any questions about your discharge instructions: Diet   no Medications  no Follow up visit  no  Do you have questions or concerns about your Care? no  Actions: * If pain score is 4 or above: No action needed, pain <4.  Pt. Stated everyone was so nice,it was a great experience.

## 2012-12-14 DIAGNOSIS — K5289 Other specified noninfective gastroenteritis and colitis: Secondary | ICD-10-CM | POA: Diagnosis not present

## 2013-01-16 DIAGNOSIS — M1710 Unilateral primary osteoarthritis, unspecified knee: Secondary | ICD-10-CM | POA: Diagnosis not present

## 2013-02-13 DIAGNOSIS — M1710 Unilateral primary osteoarthritis, unspecified knee: Secondary | ICD-10-CM | POA: Diagnosis not present

## 2013-02-14 DIAGNOSIS — M1990 Unspecified osteoarthritis, unspecified site: Secondary | ICD-10-CM | POA: Diagnosis not present

## 2013-02-18 DIAGNOSIS — M1710 Unilateral primary osteoarthritis, unspecified knee: Secondary | ICD-10-CM | POA: Diagnosis not present

## 2013-05-02 DIAGNOSIS — H6690 Otitis media, unspecified, unspecified ear: Secondary | ICD-10-CM | POA: Diagnosis not present

## 2013-05-02 DIAGNOSIS — H6120 Impacted cerumen, unspecified ear: Secondary | ICD-10-CM | POA: Diagnosis not present

## 2013-05-30 DIAGNOSIS — IMO0001 Reserved for inherently not codable concepts without codable children: Secondary | ICD-10-CM | POA: Diagnosis not present

## 2013-06-07 ENCOUNTER — Encounter: Payer: Self-pay | Admitting: Internal Medicine

## 2013-06-14 ENCOUNTER — Other Ambulatory Visit: Payer: Self-pay | Admitting: *Deleted

## 2013-06-14 MED ORDER — BENAZEPRIL HCL 20 MG PO TABS: 20.0000 mg | ORAL_TABLET | Freq: Two times a day (BID) | ORAL | Status: DC

## 2013-06-14 MED ORDER — AMLODIPINE BESYLATE 5 MG PO TABS: 5.0000 mg | ORAL_TABLET | Freq: Two times a day (BID) | ORAL | Status: DC

## 2013-07-04 ENCOUNTER — Ambulatory Visit: Payer: Medicare Other | Admitting: Internal Medicine

## 2013-07-05 DIAGNOSIS — R51 Headache: Secondary | ICD-10-CM | POA: Diagnosis not present

## 2013-07-08 DIAGNOSIS — R51 Headache: Secondary | ICD-10-CM | POA: Diagnosis not present

## 2013-07-10 ENCOUNTER — Other Ambulatory Visit: Payer: Self-pay | Admitting: Neurology

## 2013-07-10 ENCOUNTER — Encounter: Payer: Self-pay | Admitting: Neurology

## 2013-07-10 ENCOUNTER — Ambulatory Visit (INDEPENDENT_AMBULATORY_CARE_PROVIDER_SITE_OTHER): Payer: Medicare Other | Admitting: Neurology

## 2013-07-10 VITALS — BP 174/91 | HR 64 | Ht 71.5 in | Wt 242.0 lb

## 2013-07-10 DIAGNOSIS — S0410XA Injury of oculomotor nerve, unspecified side, initial encounter: Secondary | ICD-10-CM | POA: Insufficient documentation

## 2013-07-10 DIAGNOSIS — H532 Diplopia: Secondary | ICD-10-CM

## 2013-07-10 DIAGNOSIS — S0412XA Injury of oculomotor nerve, left side, initial encounter: Secondary | ICD-10-CM

## 2013-07-10 DIAGNOSIS — H4900 Third [oculomotor] nerve palsy, unspecified eye: Secondary | ICD-10-CM

## 2013-07-10 DIAGNOSIS — S0411XA Injury of oculomotor nerve, right side, initial encounter: Secondary | ICD-10-CM

## 2013-07-10 HISTORY — DX: Injury of oculomotor nerve, unspecified side, initial encounter: S04.10XA

## 2013-07-10 HISTORY — DX: Diplopia: H53.2

## 2013-07-10 MED ORDER — PREDNISONE 5 MG PO TABS: ORAL_TABLET | ORAL | Status: DC

## 2013-07-10 NOTE — Progress Notes (Signed)
Reason for visit: Headache  [name] is a 67 y.o. male  History of present illness:  [name] is a 67 year old right-handed white male with a history of hypertension. The patient indicates that one week prior to this evaluation, he noted onset of pain around the right eye. The patient also noted that he had a droopy eyelid on the right, and he began seeing double around the time of onset of the headache. The headache pain is only around the right eye, not in the frontotemporal region. The patient indicates that there have been no other symptoms of numbness or weakness on the face, arms, or legs. The patient feels somewhat imbalanced associated with the double vision, but when he covers one eye, the sensation goes away. The patient has continued to have pain around the right eye. The patient denies any problems controlling the bowels or the bladder. The patient does have neck stiffness, but this is associated with a prior cervical spine procedure, not a new problem. The patient is referred by his primary care physician for further evaluation.  Past Medical History  Diagnosis Date  . Paroxysmal atrial flutter   . Dyslipidemia   . HTN (hypertension)   . Diverticulosis of colon (without mention of hemorrhage) 2009    Colonoscopy  . Hx of colonic polyps 2009    Colonoscopy(Hyperplastic)  . Arthritis   . Diplopia 07/10/2013  . 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 Obesity     Past Surgical History  Procedure Laterality Date  . Cervical fusion  1996  . Replacement total knee  2005    left  . Colon resection  2007  . Synovial cyst excision      lumbar spine  . Tonsillectomy      Family History  Problem Relation Age of Onset  . Coronary artery disease Mother   . Colon cancer Neg Hx   . Stomach cancer Neg Hx   . Coronary artery disease Father     Social history:  reports that he has never smoked. He has never used smokeless tobacco. He reports that he does not drink  alcohol or use illicit drugs.  Medications:  Current Outpatient Prescriptions on File Prior to Visit  Medication Sig Dispense Refill  . amLODipine (NORVASC) 5 MG tablet Take 1 tablet (5 mg total) by mouth 2 (two) times daily.  60 tablet  3  . aspirin 81 MG tablet Take 81 mg by mouth daily.        . benazepril (LOTENSIN) 20 MG tablet Take 1 tablet (20 mg total) by mouth 2 (two) times daily.  60 tablet  3  . celecoxib (CELEBREX) 200 MG capsule Take 200 mg by mouth as needed.        . hydrochlorothiazide (,MICROZIDE/HYDRODIURIL,) 12.5 MG capsule Take 12.5 mg by mouth daily.        . hydrocodone-acetaminophen (LORCET-HD) 5-500 MG per capsule Take 1 capsule by mouth every 6 (six) hours as needed.       . hydrocortisone-pramoxine (ANALPRAM-HC) 2.5-1 % rectal cream Place rectally as needed for hemorrhoids.  30 g  2  . oxyCODONE-acetaminophen (PERCOCET) 10-325 MG per tablet Take 1 tablet by mouth every 6 (six) hours as needed. As needed       No current facility-administered medications on file prior to visit.    Allergies:  Allergies  Allergen Reactions  . Niacin     REACTION: causes diarreha    ROS:  Out of a complete  14 system review of symptoms, the patient complains only of the following symptoms, and all other reviewed systems are negative.  Double vision, eye pain Headache  Blood pressure 174/91, pulse 64, height 5' 11.5" (1.816 m), weight 242 lb (109.77 kg).  Physical Exam  General: The patient is alert and cooperative at the time of the examination. The patient is moderately obese.  Head: Pupils are equal, round, and reactive to light. Discs are flat bilaterally.  Neck: The neck is supple, no carotid bruits are noted.  Respiratory: The respiratory examination is clear.  Cardiovascular: The cardiovascular examination reveals a regular rate and rhythm, no obvious murmurs or rubs are noted.  Skin: Extremities are without significant edema.  Neurologic Exam  Mental  status:  Cranial nerves: Facial symmetry is not present. The patient has ptosis on the right. There is good sensation of the face to pinprick and soft touch bilaterally. The strength of the facial muscles and the muscles to head turning and shoulder shrug are normal bilaterally. Speech is well enunciated, no aphasia or dysarthria is noted. Extraocular movements are full, with the exception that there is incomplete medial deviation of the right eye with conjugate eye movements to the left. On primary gaze, the right eye is slightly superior to the left. Visual fields are full.  Motor: The motor testing reveals 5 over 5 strength of all 4 extremities. Good symmetric motor tone is noted throughout.  Sensory: Sensory testing is intact to pinprick, soft touch, vibration sensation, and position sense on all 4 extremities. No evidence of extinction is noted.  Coordination: Cerebellar testing reveals good finger-nose-finger and heel-to-shin bilaterally.  Gait and station: Gait is normal. Tandem gait is slightly unsteady. Romberg is negative, but is slightly unsteady. No drift is seen.  Reflexes: Deep tendon reflexes are symmetric, but are depressed bilaterally. Toes are downgoing bilaterally.   Assessment/Plan:  One. Right partial third nerve palsy  2. Hypertension  The patient has a partial third nerve palsy with pupillary sparing. The patient likely has had an ischemic event to the right third nerve, with incomplete injury. His risk factor includes hypertension. The patient will be set up for MRI of the brain, MRA of the head, and a sedimentation rate. The patient followup in 3-4 months. Prognosis should be good for full recovery. The patient has no history of diabetes.  Marlan Palau MD 07/10/2013 7:42 PM  [facility] [address] [place], [place] [address]  Phone [phone] Fax [phone]

## 2013-07-16 ENCOUNTER — Ambulatory Visit
Admission: RE | Admit: 2013-07-16 | Discharge: 2013-07-16 | Disposition: A | Discharge status: Home / Self Care | Payer: Medicare Other | Source: Ambulatory Visit | Attending: Neurology | Admitting: Neurology

## 2013-07-16 DIAGNOSIS — S0411XA Injury of oculomotor nerve, right side, initial encounter: Secondary | ICD-10-CM

## 2013-07-16 DIAGNOSIS — S0410XA Injury of oculomotor nerve, unspecified side, initial encounter: Secondary | ICD-10-CM | POA: Diagnosis not present

## 2013-07-16 DIAGNOSIS — S0412XA Injury of oculomotor nerve, left side, initial encounter: Secondary | ICD-10-CM

## 2013-07-16 DIAGNOSIS — H532 Diplopia: Secondary | ICD-10-CM

## 2013-07-22 ENCOUNTER — Telehealth: Payer: Self-pay | Admitting: Neurology

## 2013-07-22 NOTE — Telephone Encounter (Signed)
Pt called wants nurse to call him concerning his MRI results, states he hasn't heard anything. Thanks

## 2013-07-23 ENCOUNTER — Telehealth: Payer: Self-pay | Admitting: Neurology

## 2013-07-23 NOTE — Telephone Encounter (Signed)
Patient is calling for results of MRA and MRI, all I see are preliminary results.  He said he is very concerned.  Also states that his vision has improved but he still has headaches through his right eye.  States he can walk without being dizzy.  [phone]     (Wife cell [phone])

## 2013-07-23 NOTE — Telephone Encounter (Signed)
I called patient. The MRI the brain has some artifact from the neck surgery in the lower portions. The brain itself shows minimal small vessel ischemic changes, and the MRA of the head is normal. I discussed this with the patient. The patient is already making excellent gains with improvement in the double vision.

## 2013-07-23 NOTE — Progress Notes (Signed)
Quick Note:  Spoke to patient and relayed normal lab results, per Dr. Anne Hahn. ______

## 2013-07-24 ENCOUNTER — Ambulatory Visit: Payer: Medicare Other | Admitting: Diagnostic Neuroimaging

## 2013-07-24 ENCOUNTER — Other Ambulatory Visit: Payer: Self-pay

## 2013-08-07 DIAGNOSIS — M1710 Unilateral primary osteoarthritis, unspecified knee: Secondary | ICD-10-CM | POA: Diagnosis not present

## 2013-09-03 DIAGNOSIS — R197 Diarrhea, unspecified: Secondary | ICD-10-CM | POA: Diagnosis not present

## 2013-09-13 ENCOUNTER — Ambulatory Visit (INDEPENDENT_AMBULATORY_CARE_PROVIDER_SITE_OTHER): Payer: Medicare Other | Admitting: Internal Medicine

## 2013-09-13 ENCOUNTER — Encounter: Payer: Self-pay | Admitting: Internal Medicine

## 2013-09-13 VITALS — BP 132/74 | HR 66 | Ht 71.5 in | Wt 252.8 lb

## 2013-09-13 DIAGNOSIS — I10 Essential (primary) hypertension: Secondary | ICD-10-CM | POA: Diagnosis not present

## 2013-09-13 LAB — CBC WITH DIFFERENTIAL/PLATELET
Basophils Absolute: 0 10*3/uL (ref 0.0–0.1)
Eosinophils Absolute: 0.2 10*3/uL (ref 0.0–0.7)
HCT: 40.6 % (ref 39.0–52.0)
Hemoglobin: 14 g/dL (ref 13.0–17.0)
Lymphocytes Relative: 22.3 % (ref 12.0–46.0)
Lymphs Abs: 1.6 10*3/uL (ref 0.7–4.0)
MCV: 96.6 fl (ref 78.0–100.0)
Monocytes Absolute: 0.7 10*3/uL (ref 0.1–1.0)
Monocytes Relative: 9.9 % (ref 3.0–12.0)
Neutro Abs: 4.7 10*3/uL (ref 1.4–7.7)
Platelets: 198 10*3/uL (ref 150.0–400.0)
RDW: 13.6 % (ref 11.5–14.6)

## 2013-09-13 LAB — HEPATIC FUNCTION PANEL
ALT: 23 U/L (ref 0–53)
Bilirubin, Direct: 0 mg/dL (ref 0.0–0.3)
Total Bilirubin: 0.6 mg/dL (ref 0.3–1.2)

## 2013-09-13 LAB — BASIC METABOLIC PANEL
BUN: 29 mg/dL — ABNORMAL HIGH (ref 6–23)
CO2: 25 mEq/L (ref 19–32)
Calcium: 9.7 mg/dL (ref 8.4–10.5)
Chloride: 104 mEq/L (ref 96–112)
GFR: 76.1 mL/min (ref >60.00)
Glucose, Bld: 102 mg/dL — ABNORMAL HIGH (ref 70–99)
Potassium: 4.1 mEq/L (ref 3.5–5.1)
Sodium: 137 mEq/L (ref 135–145)

## 2013-09-13 LAB — LIPID PANEL
LDL Cholesterol: 75 mg/dL (ref 0–99)
VLDL: 36.4 mg/dL (ref 0.0–40.0)

## 2013-09-13 NOTE — Progress Notes (Signed)
HPI Patient is a 67 year old with a history of parox atrial flutter, HTN, HL.  I saw him in Fall 2013 Lipid panel on ll/6 showed HDL of 30, LDL of 63  Denies palpitations.  No CP  No SOB Not that active.  Limited by orthopedic problems. Knee  To be seen by A Collins Also had 3rd nerve palsy this summer  Resolved Wt up 20Lb Allergies  Allergen Reactions  . Niacin     REACTION: causes diarreha    Current Outpatient Prescriptions  Medication Sig Dispense Refill  . amLODipine (NORVASC) 5 MG tablet Take 1 tablet (5 mg total) by mouth 2 (two) times daily.  60 tablet  3  . aspirin 81 MG tablet Take 81 mg by mouth daily.        . benazepril (LOTENSIN) 20 MG tablet Take 1 tablet (20 mg total) by mouth 2 (two) times daily.  60 tablet  3  . celecoxib (CELEBREX) 200 MG capsule Take 200 mg by mouth as needed.        . hydrochlorothiazide (,MICROZIDE/HYDRODIURIL,) 12.5 MG capsule Take 12.5 mg by mouth daily.        . hydrocodone-acetaminophen (LORCET-HD) 5-500 MG per capsule Take 1 capsule by mouth every 6 (six) hours as needed.       . hydrocortisone-pramoxine (ANALPRAM-HC) 2.5-1 % rectal cream Place rectally as needed for hemorrhoids.  30 g  2  . Multiple Vitamin (MULTIVITAMIN) tablet Take 1 tablet by mouth daily.      Marland Kitchen oxyCODONE-acetaminophen (PERCOCET) 10-325 MG per tablet Take 1 tablet by mouth every 6 (six) hours as needed. As needed      . predniSONE (DELTASONE) 5 MG tablet Began taking 6 tablets daily, taper by one tablet daily until off the medication.  21 tablet  0  . vitamin B-12 (CYANOCOBALAMIN) 500 MCG tablet Take 500 mcg by mouth daily.       No current facility-administered medications for this visit.    Past Medical History  Diagnosis Date  . Paroxysmal atrial flutter   . Dyslipidemia   . HTN (hypertension)   . Diverticulosis of colon (without mention of hemorrhage) 2009    Colonoscopy  . Hx of colonic polyps 2009    Colonoscopy(Hyperplastic)  . Arthritis   . Diplopia  07/10/2013  . 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 Obesity     Past Surgical History  Procedure Laterality Date  . Cervical fusion  1996  . Replacement total knee  2005    left  . Colon resection  2007  . Synovial cyst excision      lumbar spine  . Tonsillectomy      Family History  Problem Relation Age of Onset  . Coronary artery disease Mother   . Colon cancer Neg Hx   . Stomach cancer Neg Hx   . Coronary artery disease Father     History   Social History  . Marital Status: Married    Spouse Name: N/A    Number of Children: 3  . Years of Education: 12   Occupational History  . Retired    Social History Main Topics  . Smoking status: Never Smoker   . Smokeless tobacco: Never Used  . Alcohol Use: No  . Drug Use: No  . Sexual Activity: Not on file   Other Topics Concern  . Not on file   Social History Narrative  . No narrative on file    Review of Systems:  All systems reviewed.  They are negative to the above problem except as previously stated.  Vital Signs: BP 132/74  Pulse 66  Ht 5' 11.5" (1.816 m)  Wt 252 lb 12.8 oz (114.669 kg)  BMI 34.77 kg/m2  Physical Exam Patient is in NAD HEENT:  Normocephalic, atraumatic. EOMI, PERRLA.  Neck: JVP is normal.  No bruits.  Lungs: clear to auscultation. No rales no wheezes.  Heart: Regular rate and rhythm. Normal S1, S2. No S3.   No significant murmurs. PMI not displaced.  Abdomen:  Supple, nontender. Normal bowel sounds. No masses. No hepatomegaly.  Extremities:   Good distal pulses throughout. No lower extremity edema.  Musculoskeletal :moving all extremities.  Neuro:   alert and oriented x3.  CN II-XII grossly intact.  EKG:  SB  54 bpm.    Assessment and Plan:  1. Atrial flutter.  NO recurrence.  Patient was extrremely symptomatic with that episode several years ago  Ques SVT as he converted with 12 adenosine  Will revifew EKGs   He has not had any spells since  2.  HTN  Adequate 3.  HL  Will  check labs today. 4.  HCM  Counsellled on wt loss.

## 2013-09-13 NOTE — Patient Instructions (Signed)
Will obtain labs today and call you with the results (cbc/bmet/hfp/lp)  Your physician recommends that you continue on your current medications as directed. Please refer to the Current Medication list given to you today.  Your physician wants you to follow-up in: 1 year You will receive a reminder letter in the mail two months in advance. If you don't receive a letter, please call our office to schedule the follow-up appointment.

## 2013-09-17 ENCOUNTER — Encounter: Payer: Self-pay | Admitting: *Deleted

## 2013-09-17 DIAGNOSIS — M1710 Unilateral primary osteoarthritis, unspecified knee: Secondary | ICD-10-CM | POA: Diagnosis not present

## 2013-09-24 DIAGNOSIS — M1710 Unilateral primary osteoarthritis, unspecified knee: Secondary | ICD-10-CM | POA: Diagnosis not present

## 2013-09-25 DIAGNOSIS — Z23 Encounter for immunization: Secondary | ICD-10-CM | POA: Diagnosis not present

## 2013-10-02 DIAGNOSIS — M1710 Unilateral primary osteoarthritis, unspecified knee: Secondary | ICD-10-CM | POA: Diagnosis not present

## 2013-10-10 DIAGNOSIS — M1710 Unilateral primary osteoarthritis, unspecified knee: Secondary | ICD-10-CM | POA: Diagnosis not present

## 2013-10-17 DIAGNOSIS — M1710 Unilateral primary osteoarthritis, unspecified knee: Secondary | ICD-10-CM | POA: Diagnosis not present

## 2013-10-24 ENCOUNTER — Other Ambulatory Visit: Payer: Self-pay

## 2013-11-05 ENCOUNTER — Other Ambulatory Visit: Payer: Self-pay

## 2013-11-05 MED ORDER — BENAZEPRIL HCL 20 MG PO TABS: 20.0000 mg | ORAL_TABLET | Freq: Two times a day (BID) | ORAL | Status: DC

## 2013-11-05 MED ORDER — AMLODIPINE BESYLATE 5 MG PO TABS: 5.0000 mg | ORAL_TABLET | Freq: Two times a day (BID) | ORAL | Status: DC

## 2013-11-12 ENCOUNTER — Ambulatory Visit (INDEPENDENT_AMBULATORY_CARE_PROVIDER_SITE_OTHER): Payer: Medicare Other | Admitting: Nurse Practitioner

## 2013-11-12 ENCOUNTER — Encounter: Payer: Self-pay | Admitting: Nurse Practitioner

## 2013-11-12 VITALS — BP 125/72 | HR 75 | Ht 71.0 in | Wt 252.5 lb

## 2013-11-12 DIAGNOSIS — H532 Diplopia: Secondary | ICD-10-CM

## 2013-11-12 DIAGNOSIS — S0411XD Injury of oculomotor nerve, right side, subsequent encounter: Secondary | ICD-10-CM

## 2013-11-12 NOTE — Progress Notes (Signed)
I have read the note, and I agree with the clinical assessment and plan.  [name]   

## 2013-11-12 NOTE — Progress Notes (Signed)
[facility]  PATIENT: [name] DOB: Sep 11, 1946   REASON FOR VISIT: follow up for third palsy   HISTORY OF PRESENT ILLNESS: [name], 67 year old white male returns for followup. He has a history of third nerve palsy right eye and was initially evaluated by Dr. Anne Hahn 07/10/2013. He says his double vision stopped about 3 months ago and his headaches stopped about 2.5  months ago. He feels his vision is back to normal he has not had other symptoms of numbness, weakness or imbalance. He does have neck stiffness due to his prior cervical surgeries. The MRI the brain has some artifact from the neck surgery in the lower portions. The brain itself shows minimal small vessel ischemic changes, and the MRA of the head is normal.  He has no new complaints today  HISTORY: Right-handed white male with a history of hypertension. The patient indicates that one week prior to this evaluation, he noted onset of pain around the right eye. The patient also noted that he had a droopy eyelid on the right, and he began seeing double around the time of onset of the headache. The headache pain is only around the right eye, not in the frontotemporal region. The patient indicates that there have been no other symptoms of numbness or weakness on the face, arms, or legs. The patient feels somewhat imbalanced associated with the double vision, but when he covers one eye, the sensation goes away. The patient has continued to have pain around the right eye. The patient denies any problems controlling the bowels or the bladder. The patient does have neck stiffness, but this is associated with a prior cervical spine procedure, not a new problem   REVIEW OF SYSTEMS: Full 14 system review of systems performed and notable only for:  Constitutional: N/A  Cardiovascular: N/A  Ear/Nose/Throat: N/A  Skin: N/A  Eyes: N/A  Respiratory: N/A  Gastroitestinal: N/A  Hematology/Lymphatic: N/A  Endocrine:  N/A Musculoskeletal:N/A  Allergy/Immunology: N/A  Neurological: N/A Psychiatric: N/A   ALLERGIES: Allergies  Allergen Reactions  . Niacin     REACTION: causes diarreha    HOME MEDICATIONS: Outpatient Prescriptions Prior to Visit  Medication Sig Dispense Refill  . amLODipine (NORVASC) 5 MG tablet Take 1 tablet (5 mg total) by mouth 2 (two) times daily.  60 tablet  3  . aspirin 81 MG tablet Take 81 mg by mouth daily.        . benazepril (LOTENSIN) 20 MG tablet Take 1 tablet (20 mg total) by mouth 2 (two) times daily.  60 tablet  3  . celecoxib (CELEBREX) 200 MG capsule Take 200 mg by mouth as needed.        . hydrochlorothiazide (,MICROZIDE/HYDRODIURIL,) 12.5 MG capsule Take 12.5 mg by mouth daily.        . hydrocortisone-pramoxine (ANALPRAM-HC) 2.5-1 % rectal cream Place rectally as needed for hemorrhoids.  30 g  2  . Multiple Vitamin (MULTIVITAMIN) tablet Take 1 tablet by mouth daily.      Marland Kitchen oxyCODONE-acetaminophen (PERCOCET) 10-325 MG per tablet Take 1 tablet by mouth every 6 (six) hours as needed. As needed      . predniSONE (DELTASONE) 5 MG tablet Began taking 6 tablets daily, taper by one tablet daily until off the medication.  21 tablet  0  . vitamin B-12 (CYANOCOBALAMIN) 500 MCG tablet Take 500 mcg by mouth daily.      . hydrocodone-acetaminophen (LORCET-HD) 5-500 MG per capsule Take 1 capsule by mouth every  6 (six) hours as needed.        No facility-administered medications prior to visit.    PAST MEDICAL HISTORY: Past Medical History  Diagnosis Date  . Paroxysmal atrial flutter   . Dyslipidemia   . HTN (hypertension)   . Diverticulosis of colon (without mention of hemorrhage) 2009    Colonoscopy  . Hx of colonic polyps 2009    Colonoscopy(Hyperplastic)  . Arthritis   . Diplopia 07/10/2013  . 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 Obesity     PAST SURGICAL HISTORY: Past Surgical History  Procedure Laterality Date  . Cervical fusion  1996  . Replacement  total knee  2005    left  . Colon resection  2007  . Synovial cyst excision      lumbar spine  . Tonsillectomy      FAMILY HISTORY: Family History  Problem Relation Age of Onset  . Coronary artery disease Mother   . Colon cancer Neg Hx   . Stomach cancer Neg Hx   . Coronary artery disease Father     SOCIAL HISTORY: History   Social History  . Marital Status: Married    Spouse Name: Joyce Gross    Number of Children: 4  . Years of Education: 12   Occupational History  . Retired    Social History Main Topics  . Smoking status: Never Smoker   . Smokeless tobacco: Never Used  . Alcohol Use: No  . Drug Use: No  . Sexual Activity: Not on file   Other Topics Concern  . Not on file   Social History Narrative   Patient lives at home with Wife Joyce Gross.    Patient has 4 children.    Patient is currently not working. He is retired and disabled.    Patient has a high school education.      PHYSICAL EXAM  Filed Vitals:   11/12/13 0957  BP: 125/72  Pulse: 75  Height: 5\' 11"  (1.803 m)  Weight: 252 lb 8 oz (114.533 kg)   Body mass index is 35.23 kg/(m^2).  Generalized: Well developed, in no acute distress  Head: normocephalic and atraumatic,. Oropharynx benign  Neck: decreased ROM,  no carotid bruits  Cardiac: Regular rate rhythm, no murmur   Neurological examination   Mentation: Alert oriented to time, place, history taking. Follows all commands speech and language fluent  Cranial nerve II-XII: Pupils were equal round reactive to light extraocular movements were full, visual field were full on confrontational test. No ptosis of the right eye, no facial weakness . hearing was intact to finger rubbing bilaterally. Uvula tongue midline. head turning and shoulder shrug and were normal and symmetric.Tongue protrusion into cheek strength was normal. Motor: normal bulk and tone, full strength in the BUE, BLE, fine finger movements normal, no pronator drift. No focal  weakness Sensory: normal and symmetric to light touch, pinprick, and  vibration  Coordination: finger-nose-finger, heel-to-shin bilaterally, no dysmetria Reflexes: Diminished upper and lower and symmetric Gait and Station: Rising up from seated position without assistance, normal stance,  moderate stride, good arm swing, smooth turning, able to perform tiptoe, and heel walking without difficulty. Tandem gait steady  DIAGNOSTIC DATA (LABS, IMAGING, TESTING) - I reviewed patient records, labs, notes, testing and imaging myself where available.  Lab Results  Component Value Date   WBC 7.2 09/13/2013   HGB 14.0 09/13/2013   HCT 40.6 09/13/2013   MCV 96.6 09/13/2013   PLT 198.0 09/13/2013  Component Value Date/Time   NA 137 09/13/2013 1017   K 4.1 09/13/2013 1017   CL 104 09/13/2013 1017   CO2 25 09/13/2013 1017   GLUCOSE 102* 09/13/2013 1017   BUN 29* 09/13/2013 1017   CREATININE 1.0 09/13/2013 1017   CALCIUM 9.7 09/13/2013 1017   PROT 7.9 09/13/2013 1017   ALBUMIN 4.8 09/13/2013 1017   AST 24 09/13/2013 1017   ALT 23 09/13/2013 1017   ALKPHOS 64 09/13/2013 1017   BILITOT 0.6 09/13/2013 1017   GFRNONAA 72.66 11/22/2010 1036   GFRAA  Value: >60        The eGFR has been calculated using the MDRD equation. This calculation has not been validated in all clinical 03/21/2008 0413   Lab Results  Component Value Date   CHOL 145 09/13/2013   HDL 34.00* 09/13/2013   LDLCALC 75 09/13/2013   TRIG 182.0* 09/13/2013   CHOLHDL 4 09/13/2013   ASSESSMENT AND PLAN  67 y.o. year old male  has a past medical history of Paroxysmal atrial flutter; Dyslipidemia; HTN (hypertension); Diverticulosis of colon (without mention of hemorrhage) (2009); colonic polyps (2009); Arthritis; Diplopia (07/10/2013); 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 (07/10/2013); and Obesity. here to followup. The double vision and right ptosis have resolved   Keep B/P in control with systolic number 130 or lower. Exercise for overall general health and  well-being  F/U prn  Nilda Riggs, [facility], [facility], APRN  [facility] [address], [address] [place], [place] [address] [phone]

## 2013-11-12 NOTE — Patient Instructions (Signed)
Keep B/P in control with systolic number 130 or lower.  F/U prn

## 2013-11-29 ENCOUNTER — Telehealth: Payer: Self-pay | Admitting: Internal Medicine

## 2013-11-29 DIAGNOSIS — M1710 Unilateral primary osteoarthritis, unspecified knee: Secondary | ICD-10-CM | POA: Diagnosis not present

## 2013-11-29 NOTE — Telephone Encounter (Signed)
Walk in pt Form " [facility]" Clearance Dropped off gave to Victorio Palm

## 2013-12-05 DIAGNOSIS — M1710 Unilateral primary osteoarthritis, unspecified knee: Secondary | ICD-10-CM | POA: Diagnosis not present

## 2013-12-05 DIAGNOSIS — Z01818 Encounter for other preprocedural examination: Secondary | ICD-10-CM | POA: Diagnosis not present

## 2013-12-05 DIAGNOSIS — I10 Essential (primary) hypertension: Secondary | ICD-10-CM | POA: Diagnosis not present

## 2013-12-09 ENCOUNTER — Telehealth: Payer: Self-pay | Admitting: Internal Medicine

## 2013-12-09 NOTE — Telephone Encounter (Signed)
Received request from Nurse fax box, documents faxed for surgical clearance. To: [facility] Fax number: [phone] Attention: 12/09/13/KM

## 2013-12-23 DIAGNOSIS — R05 Cough: Secondary | ICD-10-CM | POA: Diagnosis not present

## 2013-12-23 DIAGNOSIS — R059 Cough, unspecified: Secondary | ICD-10-CM | POA: Diagnosis not present

## 2013-12-23 DIAGNOSIS — J019 Acute sinusitis, unspecified: Secondary | ICD-10-CM | POA: Diagnosis not present

## 2014-01-10 DIAGNOSIS — IMO0002 Reserved for concepts with insufficient information to code with codable children: Secondary | ICD-10-CM | POA: Diagnosis not present

## 2014-01-10 DIAGNOSIS — M1710 Unilateral primary osteoarthritis, unspecified knee: Secondary | ICD-10-CM | POA: Diagnosis not present

## 2014-02-24 DIAGNOSIS — E782 Mixed hyperlipidemia: Secondary | ICD-10-CM | POA: Diagnosis not present

## 2014-02-24 DIAGNOSIS — I10 Essential (primary) hypertension: Secondary | ICD-10-CM | POA: Diagnosis not present

## 2014-02-24 DIAGNOSIS — M1990 Unspecified osteoarthritis, unspecified site: Secondary | ICD-10-CM | POA: Diagnosis not present

## 2014-02-24 DIAGNOSIS — Z0000 Encounter for general adult medical examination without abnormal findings: Secondary | ICD-10-CM | POA: Diagnosis not present

## 2014-02-24 DIAGNOSIS — Z1331 Encounter for screening for depression: Secondary | ICD-10-CM | POA: Diagnosis not present

## 2014-02-24 DIAGNOSIS — R7309 Other abnormal glucose: Secondary | ICD-10-CM | POA: Diagnosis not present

## 2014-02-24 DIAGNOSIS — N400 Enlarged prostate without lower urinary tract symptoms: Secondary | ICD-10-CM | POA: Diagnosis not present

## 2014-02-24 DIAGNOSIS — E669 Obesity, unspecified: Secondary | ICD-10-CM | POA: Diagnosis not present

## 2014-03-20 ENCOUNTER — Other Ambulatory Visit: Payer: Self-pay

## 2014-03-20 MED ORDER — BENAZEPRIL HCL 20 MG PO TABS: 20.0000 mg | ORAL_TABLET | Freq: Two times a day (BID) | ORAL | Status: DC

## 2014-03-20 MED ORDER — AMLODIPINE BESYLATE 5 MG PO TABS: 5.0000 mg | ORAL_TABLET | Freq: Two times a day (BID) | ORAL | Status: DC

## 2014-03-31 DIAGNOSIS — J069 Acute upper respiratory infection, unspecified: Secondary | ICD-10-CM | POA: Diagnosis not present

## 2014-04-07 DIAGNOSIS — R05 Cough: Secondary | ICD-10-CM | POA: Diagnosis not present

## 2014-04-07 DIAGNOSIS — R059 Cough, unspecified: Secondary | ICD-10-CM | POA: Diagnosis not present

## 2014-04-07 DIAGNOSIS — R062 Wheezing: Secondary | ICD-10-CM | POA: Diagnosis not present

## 2014-07-28 ENCOUNTER — Other Ambulatory Visit: Payer: Self-pay

## 2014-07-28 ENCOUNTER — Encounter (HOSPITAL_COMMUNITY): Payer: Self-pay | Admitting: Emergency Medicine

## 2014-07-28 ENCOUNTER — Emergency Department (HOSPITAL_COMMUNITY)
Admission: EM | Admit: 2014-07-28 | Discharge: 2014-07-28 | Disposition: A | Discharge status: Home / Self Care | Payer: Medicare Other | Attending: Emergency Medicine | Admitting: Emergency Medicine

## 2014-07-28 DIAGNOSIS — Z8669 Personal history of other diseases of the nervous system and sense organs: Secondary | ICD-10-CM | POA: Insufficient documentation

## 2014-07-28 DIAGNOSIS — I4892 Unspecified atrial flutter: Secondary | ICD-10-CM | POA: Diagnosis not present

## 2014-07-28 DIAGNOSIS — Z860100 Personal history of colon polyps, unspecified: Secondary | ICD-10-CM | POA: Insufficient documentation

## 2014-07-28 DIAGNOSIS — Z7982 Long term (current) use of aspirin: Secondary | ICD-10-CM | POA: Insufficient documentation

## 2014-07-28 DIAGNOSIS — Z79899 Other long term (current) drug therapy: Secondary | ICD-10-CM | POA: Diagnosis not present

## 2014-07-28 DIAGNOSIS — M129 Arthropathy, unspecified: Secondary | ICD-10-CM | POA: Insufficient documentation

## 2014-07-28 DIAGNOSIS — E669 Obesity, unspecified: Secondary | ICD-10-CM | POA: Diagnosis not present

## 2014-07-28 DIAGNOSIS — Z9889 Other specified postprocedural states: Secondary | ICD-10-CM | POA: Diagnosis not present

## 2014-07-28 DIAGNOSIS — R000 Tachycardia, unspecified: Secondary | ICD-10-CM | POA: Insufficient documentation

## 2014-07-28 DIAGNOSIS — Z9189 Other specified personal risk factors, not elsewhere classified: Secondary | ICD-10-CM | POA: Insufficient documentation

## 2014-07-28 DIAGNOSIS — I10 Essential (primary) hypertension: Secondary | ICD-10-CM | POA: Diagnosis not present

## 2014-07-28 DIAGNOSIS — Z8719 Personal history of other diseases of the digestive system: Secondary | ICD-10-CM | POA: Insufficient documentation

## 2014-07-28 DIAGNOSIS — Z87828 Personal history of other (healed) physical injury and trauma: Secondary | ICD-10-CM | POA: Diagnosis not present

## 2014-07-28 LAB — CBC
HEMATOCRIT: 42.2 % (ref 39.0–52.0)
Hemoglobin: 14.5 g/dL (ref 13.0–17.0)
MCH: 33 pg (ref 26.0–34.0)
MCHC: 34.4 g/dL (ref 30.0–36.0)
MCV: 95.9 fL (ref 78.0–100.0)
Platelets: 171 10*3/uL (ref 150–400)
RBC: 4.4 MIL/uL (ref 4.22–5.81)
RDW: 13.1 % (ref 11.5–15.5)
WBC: 5.9 10*3/uL (ref 4.0–10.5)

## 2014-07-28 LAB — BASIC METABOLIC PANEL
Anion gap: 14 (ref 5–15)
BUN: 18 mg/dL (ref 6–23)
CALCIUM: 9.4 mg/dL (ref 8.4–10.5)
CO2: 25 mEq/L (ref 19–32)
Chloride: 101 mEq/L (ref 96–112)
Creatinine, Ser: 0.89 mg/dL (ref 0.50–1.35)
GFR, EST NON AFRICAN AMERICAN: 85 mL/min — AB (ref >90)
Glucose, Bld: 104 mg/dL — ABNORMAL HIGH (ref 70–99)
POTASSIUM: 4.3 meq/L (ref 3.7–5.3)
Sodium: 140 mEq/L (ref 137–147)

## 2014-07-28 LAB — TROPONIN I: Troponin I: <0.3 ng/mL (ref <0.30)

## 2014-07-28 MED ORDER — METOPROLOL TARTRATE 25 MG PO TABS
25.0000 mg | ORAL_TABLET | Freq: Once | ORAL | Status: AC
  Administered 2014-07-28: 25 mg via ORAL
  Filled 2014-07-28: qty 1

## 2014-07-28 MED ORDER — METOPROLOL TARTRATE 25 MG PO TABS: 25.0000 mg | ORAL_TABLET | Freq: Two times a day (BID) | ORAL | Status: DC

## 2014-07-28 NOTE — Discharge Instructions (Signed)
Decrease Lotensin to 10 mg a day. Start Lopressor as prescribed, follow up with Doctor Harrington Challenger. Return to the ER for recurrent episodes of fast heart rate.  Atrial Flutter Atrial flutter is a heart rhythm that can cause the heart to beat very fast (tachycardia). It originates in the upper chambers of the heart (atria). In atrial flutter, the top chambers of the heart (atria) often beat much faster than the bottom chambers of the heart (ventricles). Atrial flutter has a regular "saw toothed" appearance in an EKG readout. An EKG is a test that records the electrical activity of the heart. Atrial flutter can cause the heart to beat up to 150 beats per minute (BPM). Atrial flutter can either be short lived (paroxysmal) or permanent.  CAUSES  Causes of atrial flutter can be many. Some of these include:  Heart related issues:  Heart attack (myocardial infarction).  Heart failure.  Heart valve problems.  Poorly controlled high blood pressure (hypertension).  After open heart surgery.  Lung related issues:  A blood clot in the lungs (pulmonary embolism).  Chronic obstructive pulmonary disease (COPD). Medications used to treat COPD can attribute to atrial flutter.  Other related causes:  Hyperthyroidism.  Caffeine.  Some decongestant cold medications.  Low electrolyte levels such as potassium or magnesium.  Cocaine. SYMPTOMS  An awareness of your heart beating rapidly (palpitations).  Shortness of breath.  Chest pain.  Low blood pressure (hypotension).  Dizziness or fainting. DIAGNOSIS  Different tests can be performed to diagnose atrial flutter.   An EKG.  Holter monitor. This is a 24-hour recording of your heart rhythm. You will also be given a diary. Write down all symptoms that you have and what you were doing at the time you experienced symptoms.  Cardiac event monitor. This small device can be worn for up to 30 days. When you have heart symptoms, you will push a button  on the device. This will then record your heart rhythm.  Echocardiogram. This is an imaging test to look at your heart. Your caregiver will look at your heart valves and the ventricles.  Stress test. This test can help determine if the atrial flutter is related to exercise or if coronary artery disease is present.  Laboratory studies will look at certain blood levels like:  Complete blood count (CBC).  Potassium.  Magnesium.  Thyroid function. TREATMENT  Treatment of atrial flutter varies. A combination of therapies may be used or sometimes atrial flutter may need only 1 type of treatment.  Lab work: If your blood work, such as your electrolytes (potassium, magnesium) or your thyroid function tests, are abnormal, your caregiver will treat them accordingly.  Medication:  There are several different types of medications that can convert your heart to a normal rhythm and prevent atrial flutter from reoccurring.  Nonsurgical procedures: Nonsurgical techniques may be used to control atrial flutter. Some examples include:  Cardioversion. This technique uses either drugs or an electrical shock to restore a normal heart rhythm:  Cardioversion drugs may be given through an intravenous (IV) line to help "reset" the heart rhythm.  In electrical cardioversion, your caregiver shocks your heart with electrical energy. This helps to reset the heartbeat to a normal rhythm.  Ablation. If atrial flutter is a persistent problem, an ablation may be needed. This procedure is done under mild sedation. High frequency radio-wave energy is used to destroy the area of heart tissue responsible for atrial flutter. SEEK IMMEDIATE MEDICAL CARE IF:  You have:  Dizziness.  Near fainting or fainting.  Shortness of breath.  Chest pain or pressure.  Sudden nausea or vomiting.  Profuse sweating. If you have the above symptoms, call your local emergency service immediately! Do not drive yourself to the  hospital. MAKE SURE YOU:   Understand these instructions.  Will watch your condition.  Will get help right away if you are not doing well or get worse. Document Released: 04/23/2009 Document Revised: 04/21/2014 Document Reviewed: 04/23/2009 [facility] Patient Information 2015 [place], [place]. This information is not intended to replace advice given to you by your health care provider. Make sure you discuss any questions you have with your health care provider.  Electrophysiology Study An electrophysiology (EP) study is an invasive heart test done through catheters placed in a large vein in your groin, arm, neck, or chest. It is done to evaluate the electrical conduction system of your heart. An EP study is done if other heart tests have not found or fully explained the cause of symptoms such as:  Dizziness or fainting.  A fast heartbeat (tachycardia).  A slow heartbeat (bradycardia). If the cause of your symptoms is found during the EP study, your health care provider will discuss treatment options. Some treatment options that may be done to correct your symptoms include:  Cardiac ablation. Cardiac ablation destroys a small area of heart tissue that may be causing tachycardia.  Implantable cardioverter defibrillator (ICD). An ICD can detect a fast or abnormal heart rate. When an abnormal rhythm is detected, the ICD shocks the heart to restore it to a normal heart rhythm. LET [facility] CARE PROVIDER KNOW ABOUT:   Any allergies you have.  All medicines you are taking, including vitamins, herbs, eye drops, creams, and over-the-counter medicines.  Previous problems you or members of your family have had with the use of anesthetics.  Any blood disorders you have.  Previous surgeries you have had.  Medical conditions you have. RISKS AND COMPLICATIONS  Generally, this is a safe procedure. However, as with any procedure, problems can occur. Possible problems include:  Tachycardia that  does not go away. This may require shocking your heart (cardioversion).  Bleeding or bruising from the catheter insertion sites.  Infection at the catheter insertion sites.  Temporary or permanent heart rhythm abnormalities.  Temporary changes in blood pressure.  Puncture (perforation) of the heart wall. This can cause bleeding between the heart and the sac that surrounds it (cardiac tamponade). This is a life-threatening condition that may require heart surgery.  Possible cardiac arrest or fatal heart arrhythmia. BEFORE THE PROCEDURE  Do not eat or drink before the EP study as instructed by your health care provider.  Be sure to urinate before the EP study.  If you are going home after the EP study, someone will need to drive you home and stay with you for 24 hours. PROCEDURE The EP study is performed in a catheterization laboratory. This is a room set up to do heart procedures.   You will be given medicine through an intravenous (IV) access to reduce discomfort and help you relax.  The area where the catheter will be inserted will be shaved as needed and cleansed. Sterile drapes will cover you. This will keep the area sterile.  Thin, flexible tubes (catheters) with an electrode tip will be inserted into a large vein. From there, the catheters are guided to the heart using a type of X-ray machine (fluoroscopy). Once in the heart, the catheters evaluate the electrical activity of your heart.  If you are awake during the EP study, you may feel dizzy or light-headed. Your heart rate may temporarily increase or you may feel your heart beating hard. Tell your health care provider if you feel dizzy, nauseated, or have chest pain or pressure during the EP study.  When the EP study is done, the catheters are removed.  Firm pressure is applied to the insertion sites. This is done to prevent bleeding. AFTER THE PROCEDURE  You will need to lie flat for a few hours or as told by your health  care provider. You will need to keep your legs straight. Do not bend or cross your legs. This is done so the clot at the insertion does not break loose and cause bleeding.  If you took blood thinners before the EP study, ask your health care provider when you can start taking them again. Document Released: 05/25/2010 Document Revised: 12/10/2013 Document Reviewed: 06/19/2013 [facility] Patient Information 2015 [place], [place]. This information is not intended to replace advice given to you by your health care provider. Make sure you discuss any questions you have with your health care provider.

## 2014-07-28 NOTE — ED Provider Notes (Signed)
CSN: [id number]     Arrival date & time 07/28/14  4627 History   First MD Initiated Contact with Patient 07/28/14 1000     Chief Complaint  Patient presents with  . Tachycardia     (Consider location/radiation/quality/duration/timing/severity/associated sxs/prior Treatment) HPI Comments: Patient presents to the ER for evaluation of elevated heart rate. Patient reports that he had onset of rapid heart rate and fluttering this morning. Patient does report a previous history of SVT requiring chemical cardioversion in the ER. He reports he has not had any episodes for approximately 10 years. Patient did have rapid heart rate in triage, after being brought back to the ER room, however, he reports sudden improvement. Patient not expecting any chest pain. There is no shortness of breath.   Past Medical History  Diagnosis Date  . Paroxysmal atrial flutter   . Dyslipidemia   . HTN (hypertension)   . Diverticulosis of colon (without mention of hemorrhage) 2009    Colonoscopy  . Hx of colonic polyps 2009    Colonoscopy(Hyperplastic)  . Arthritis   . Diplopia 07/10/2013  . 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 Obesity    Past Surgical History  Procedure Laterality Date  . Cervical fusion  1996  . Replacement total knee  2005    left  . Colon resection  2007  . Synovial cyst excision      lumbar spine  . Tonsillectomy     Family History  Problem Relation Age of Onset  . Coronary artery disease Mother   . Colon cancer Neg Hx   . Stomach cancer Neg Hx   . Coronary artery disease Father    History  Substance Use Topics  . Smoking status: Never Smoker   . Smokeless tobacco: Never Used  . Alcohol Use: No    Review of Systems  Cardiovascular: Positive for palpitations.  All other systems reviewed and are negative.     Allergies  Niacin  Home Medications   Prior to Admission medications   Medication Sig Start Date End Date Taking? Authorizing Provider  amLODipine  (NORVASC) 5 MG tablet Take 5 mg by mouth 2 (two) times daily.   Yes Historical Provider, MD  aspirin EC 81 MG tablet Take 81 mg by mouth daily.   Yes Historical Provider, MD  benazepril (LOTENSIN) 20 MG tablet Take 20 mg by mouth 2 (two) times daily.   Yes Historical Provider, MD  celecoxib (CELEBREX) 200 MG capsule Take 200 mg by mouth daily as needed for mild pain.    Yes Historical Provider, MD  hydrochlorothiazide (,MICROZIDE/HYDRODIURIL,) 12.5 MG capsule Take 12.5 mg by mouth daily.     Yes Historical Provider, MD  HYDROcodone-acetaminophen (NORCO/VICODIN) 5-325 MG per tablet Take 1 tablet by mouth every 6 (six) hours as needed for moderate pain.   Yes Historical Provider, MD  Multiple Vitamin (MULTIVITAMIN) tablet Take 1 tablet by mouth daily.   Yes Historical Provider, MD  oxyCODONE-acetaminophen (PERCOCET) 10-325 MG per tablet Take 1 tablet by mouth every 6 (six) hours as needed for pain. As needed   Yes Historical Provider, MD  vitamin B-12 (CYANOCOBALAMIN) 500 MCG tablet Take 500 mcg by mouth daily.   Yes Historical Provider, MD  vitamin C (ASCORBIC ACID) 500 MG tablet Take 500 mg by mouth daily.   Yes Historical Provider, MD   BP 115/62  Pulse 50  Temp(Src) 99.4 F (37.4 C) (Oral)  Resp 10  SpO2 96% Physical Exam  Constitutional: He is oriented to  person, place, and time. He appears well-developed and well-nourished. No distress.  HENT:  Head: Normocephalic and atraumatic.  Right Ear: Hearing normal.  Left Ear: Hearing normal.  Nose: Nose normal.  Mouth/Throat: Oropharynx is clear and moist and mucous membranes are normal.  Eyes: Conjunctivae and EOM are normal. Pupils are equal, round, and reactive to light.  Neck: Normal range of motion. Neck supple.  Cardiovascular: Regular rhythm, S1 normal and S2 normal.  Exam reveals no gallop and no friction rub.   No murmur heard. Pulmonary/Chest: Effort normal and breath sounds normal. No respiratory distress. He exhibits no  tenderness.  Abdominal: Soft. Normal appearance and bowel sounds are normal. There is no hepatosplenomegaly. There is no tenderness. There is no rebound, no guarding, no tenderness at McBurney's point and negative Murphy's sign. No hernia.  Musculoskeletal: Normal range of motion.  Neurological: He is alert and oriented to person, place, and time. He has normal strength. No cranial nerve deficit or sensory deficit. Coordination normal. GCS eye subscore is 4. GCS verbal subscore is 5. GCS motor subscore is 6.  Skin: Skin is warm, dry and intact. No rash noted. No cyanosis.  Psychiatric: He has a normal mood and affect. His speech is normal and behavior is normal. Thought content normal.    ED Course  Procedures (including critical care time) Labs Review Labs Reviewed  BASIC METABOLIC PANEL - Abnormal; Notable for the following:    Glucose, Bld 104 (*)    GFR calc non Af Amer 85 (*)    All other components within normal limits  CBC  TROPONIN I    Imaging Review No results found.     EKG Interpretation  Date/Time:  Monday July 28 2014 10:07:53 EDT Ventricular Rate:  74 PR Interval:  174 QRS Duration: 94 QT Interval:  365 QTC Calculation: 405 R Axis:   60 Text Interpretation:  Sinus rhythm Baseline wander in lead(s) V2 Otherwise within normal limits Confirmed by POLLINA  MD, CHRISTOPHER [phone]) on 07/28/2014 10:22:36 AM       EKG Interpretation  Date/Time:  Monday July 28 2014 10:07:53 EDT Ventricular Rate:  74 PR Interval:  174 QRS Duration: 94 QT Interval:  365 QTC Calculation: 405 R Axis:   60 Text Interpretation:  Sinus rhythm Baseline wander in lead(s) V2 Otherwise within normal limits Confirmed by POLLINA  MD, CHRISTOPHER ([id number]) on 07/28/2014 10:22:36 AM         MDM   Final diagnoses:  None   paroxysmal atrial flutter  Patient presented to the ER for evaluation of tachycardia and palpitations. Patient does have a history of tachycardia in the past. He  tells me that he had an episode that resolved with adenosine, but looking back at his previous tracings, EKG was most consistent with atrial flutter. EKG today once again is very convincing for atrial flutter. It spontaneously resolved before her treatment here in the ER. He has been monitored for a period of time without any recurrence. Lab work unremarkable.  Case discussed with Doctor Harrington Challenger. He does not require any anticoagulation at this time. She will arrange for EP followup. She recommends decreasing Lotensin to 10 mg daily, continue Norvasc, continue hydrochlorothiazide, add Lopressor 25 mg by mouth twice daily. The patient did get Lopressor here in the ER and had some bradycardia with it, but tolerated it fine. He was told that if he is having dizziness and feels like his heart rate is too low, he can take half the tablet of  Lopressor twice daily. Doctor Harrington Challenger is arrange for followup in the office.    Orpah Greek, MD 07/28/14 1320

## 2014-07-28 NOTE — ED Notes (Signed)
Pt here with palpitations and tachycardia; pt sts hx of same; pt denies CP or SOB at present

## 2014-07-29 DIAGNOSIS — R439 Unspecified disturbances of smell and taste: Secondary | ICD-10-CM | POA: Diagnosis not present

## 2014-07-30 DIAGNOSIS — D485 Neoplasm of uncertain behavior of skin: Secondary | ICD-10-CM | POA: Diagnosis not present

## 2014-07-30 DIAGNOSIS — D235 Other benign neoplasm of skin of trunk: Secondary | ICD-10-CM | POA: Diagnosis not present

## 2014-07-30 DIAGNOSIS — L819 Disorder of pigmentation, unspecified: Secondary | ICD-10-CM | POA: Diagnosis not present

## 2014-07-30 DIAGNOSIS — L570 Actinic keratosis: Secondary | ICD-10-CM | POA: Diagnosis not present

## 2014-08-01 ENCOUNTER — Ambulatory Visit (HOSPITAL_COMMUNITY): Payer: Medicare Other | Attending: Cardiovascular Disease | Admitting: Cardiology

## 2014-08-01 DIAGNOSIS — I4892 Unspecified atrial flutter: Secondary | ICD-10-CM | POA: Diagnosis not present

## 2014-08-01 NOTE — Progress Notes (Signed)
Echo performed. 

## 2014-08-06 DIAGNOSIS — R439 Unspecified disturbances of smell and taste: Secondary | ICD-10-CM | POA: Diagnosis not present

## 2014-08-07 ENCOUNTER — Telehealth: Payer: Self-pay | Admitting: *Deleted

## 2014-08-07 NOTE — Telephone Encounter (Signed)
pt notified about echo results with verbal understanding and verified appt 9/18 8:30 with Dr. Harrington Challenger.Marland KitchenMarland Kitchen

## 2014-09-02 DIAGNOSIS — Z23 Encounter for immunization: Secondary | ICD-10-CM | POA: Diagnosis not present

## 2014-09-03 ENCOUNTER — Other Ambulatory Visit: Payer: Self-pay | Admitting: Internal Medicine

## 2014-09-03 DIAGNOSIS — Z1331 Encounter for screening for depression: Secondary | ICD-10-CM | POA: Diagnosis not present

## 2014-09-03 DIAGNOSIS — M1990 Unspecified osteoarthritis, unspecified site: Secondary | ICD-10-CM | POA: Diagnosis not present

## 2014-09-03 DIAGNOSIS — R439 Unspecified disturbances of smell and taste: Secondary | ICD-10-CM | POA: Diagnosis not present

## 2014-09-03 DIAGNOSIS — G44029 Chronic cluster headache, not intractable: Secondary | ICD-10-CM

## 2014-09-03 DIAGNOSIS — R430 Anosmia: Secondary | ICD-10-CM

## 2014-09-03 DIAGNOSIS — R51 Headache: Secondary | ICD-10-CM | POA: Diagnosis not present

## 2014-09-03 DIAGNOSIS — I10 Essential (primary) hypertension: Secondary | ICD-10-CM | POA: Diagnosis not present

## 2014-09-04 ENCOUNTER — Ambulatory Visit
Admission: RE | Admit: 2014-09-04 | Discharge: 2014-09-04 | Disposition: A | Discharge status: Home / Self Care | Payer: Medicare Other | Source: Ambulatory Visit | Attending: Internal Medicine | Admitting: Internal Medicine

## 2014-09-04 DIAGNOSIS — G44029 Chronic cluster headache, not intractable: Secondary | ICD-10-CM

## 2014-09-04 DIAGNOSIS — R439 Unspecified disturbances of smell and taste: Secondary | ICD-10-CM | POA: Diagnosis not present

## 2014-09-04 DIAGNOSIS — R430 Anosmia: Secondary | ICD-10-CM

## 2014-09-04 NOTE — Progress Notes (Signed)
HPI Patient is a 68 year old with a history of parox atrial flutter, HTN, HL.  I saw him in Fall 2014 He was seen in ER for atrial flutter in august  Converted on own  Extremely symptomatic.   NOne since.  Echo with normal LV function.    Allergies  Allergen Reactions  . Niacin Diarrhea    REACTION: causes diarreha    Current Outpatient Prescriptions  Medication Sig Dispense Refill  . amLODipine (NORVASC) 5 MG tablet Take 5 mg by mouth 2 (two) times daily.      Marland Kitchen aspirin EC 81 MG tablet Take 81 mg by mouth daily.      . benazepril (LOTENSIN) 20 MG tablet Take 20 mg by mouth 2 (two) times daily.      . celecoxib (CELEBREX) 200 MG capsule Take 200 mg by mouth daily as needed for mild pain.       . hydrochlorothiazide (,MICROZIDE/HYDRODIURIL,) 12.5 MG capsule Take 12.5 mg by mouth daily.        Marland Kitchen HYDROcodone-acetaminophen (NORCO/VICODIN) 5-325 MG per tablet Take 1 tablet by mouth every 6 (six) hours as needed for moderate pain.      . metoprolol (LOPRESSOR) 25 MG tablet Take 1 tablet (25 mg total) by mouth 2 (two) times daily.  60 tablet  0  . Multiple Vitamin (MULTIVITAMIN) tablet Take 1 tablet by mouth daily.      Marland Kitchen oxyCODONE-acetaminophen (PERCOCET) 10-325 MG per tablet Take 1 tablet by mouth every 6 (six) hours as needed for pain. As needed      . vitamin B-12 (CYANOCOBALAMIN) 500 MCG tablet Take 500 mcg by mouth daily.      . vitamin C (ASCORBIC ACID) 500 MG tablet Take 500 mg by mouth daily.       No current facility-administered medications for this visit.    Past Medical History  Diagnosis Date  . Paroxysmal atrial flutter   . Dyslipidemia   . HTN (hypertension)   . Diverticulosis of colon (without mention of hemorrhage) 2009    Colonoscopy  . Hx of colonic polyps 2009    Colonoscopy(Hyperplastic)  . Arthritis   . Diplopia 07/10/2013  . 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 Obesity     Past Surgical History  Procedure Laterality Date  . Cervical fusion  1996  .  Replacement total knee  2005    left  . Colon resection  2007  . Synovial cyst excision      lumbar spine  . Tonsillectomy      Family History  Problem Relation Age of Onset  . Coronary artery disease Mother   . Colon cancer Neg Hx   . Stomach cancer Neg Hx   . Coronary artery disease Father     History   Social History  . Marital Status: Married    Spouse Name: Zigmund Daniel    Number of Children: 4  . Years of Education: 12   Occupational History  . Retired    Social History Main Topics  . Smoking status: Never Smoker   . Smokeless tobacco: Never Used  . Alcohol Use: No  . Drug Use: No  . Sexual Activity: Not on file   Other Topics Concern  . Not on file   Social History Narrative   Patient lives at home with Wife Zigmund Daniel.    Patient has 4 children.    Patient is currently not working. He is retired and disabled.    Patient has a  high school education.     Review of Systems:  All systems reviewed.  They are negative to the above problem except as previously stated.  Vital Signs: BP 129/63  Pulse 64  Ht 5\' 11"  (1.803 m)  Wt 255 lb (115.667 kg)  BMI 35.58 kg/m2  Physical Exam Patient is in NAD HEENT:  Normocephalic, atraumatic. EOMI, PERRLA.  Neck: JVP is normal.  No bruits.  Lungs: clear to auscultation. No rales no wheezes.  Heart: Regular rate and rhythm. Normal S1, S2. No S3.   No significant murmurs. PMI not displaced.  Abdomen:  Supple, nontender. Normal bowel sounds. No masses. No hepatomegaly.  Extremities:   Good distal pulses throughout. No lower extremity edema.  Musculoskeletal :moving all extremities.  Neuro:   alert and oriented x3.  CN II-XII grossly intact.  EKG:  SR 64 bpm.    Assessment and Plan:  1. Atrial flutter. Recent spell  Very symptomatic  Converted on own.  If he has antoher spell will refer to EP 2.  HTN  Adequate 3.  HL  Will check labs today.

## 2014-09-05 ENCOUNTER — Ambulatory Visit (INDEPENDENT_AMBULATORY_CARE_PROVIDER_SITE_OTHER): Payer: Medicare Other | Admitting: Internal Medicine

## 2014-09-05 ENCOUNTER — Encounter: Payer: Self-pay | Admitting: Internal Medicine

## 2014-09-05 VITALS — BP 129/63 | HR 64 | Ht 71.0 in | Wt 255.0 lb

## 2014-09-05 DIAGNOSIS — I4892 Unspecified atrial flutter: Secondary | ICD-10-CM | POA: Diagnosis not present

## 2014-09-05 DIAGNOSIS — E785 Hyperlipidemia, unspecified: Secondary | ICD-10-CM

## 2014-09-05 LAB — BASIC METABOLIC PANEL
BUN: 22 mg/dL (ref 6–23)
CALCIUM: 9.6 mg/dL (ref 8.4–10.5)
CO2: 25 mEq/L (ref 19–32)
CREATININE: 1.1 mg/dL (ref 0.4–1.5)
Chloride: 102 mEq/L (ref 96–112)
GFR: 70.34 mL/min (ref >60.00)
Glucose, Bld: 110 mg/dL — ABNORMAL HIGH (ref 70–99)
Potassium: 4 mEq/L (ref 3.5–5.1)
SODIUM: 137 meq/L (ref 135–145)

## 2014-09-05 LAB — CBC
HCT: 41.9 % (ref 39.0–52.0)
Hemoglobin: 14 g/dL (ref 13.0–17.0)
MCHC: 33.4 g/dL (ref 30.0–36.0)
MCV: 98 fl (ref 78.0–100.0)
Platelets: 178 10*3/uL (ref 150.0–400.0)
RBC: 4.27 Mil/uL (ref 4.22–5.81)
RDW: 14.3 % (ref 11.5–15.5)
WBC: 6.4 10*3/uL (ref 4.0–10.5)

## 2014-09-05 LAB — LIPID PANEL
CHOLESTEROL: 161 mg/dL (ref 0–200)
HDL: 27.5 mg/dL — AB (ref >39.00)
LDL Cholesterol: 105 mg/dL — ABNORMAL HIGH (ref 0–99)
NonHDL: 133.5
Total CHOL/HDL Ratio: 6
Triglycerides: 145 mg/dL (ref 0.0–149.0)
VLDL: 29 mg/dL (ref 0.0–40.0)

## 2014-09-05 NOTE — Patient Instructions (Signed)
Your physician recommends that you continue on your current medications as directed. Please refer to the Current Medication list given to you today.  Your physician recommends that you return for lab work today (CBC, BMET, LIPIDS)  Your physician wants you to follow-up in: 1 YEAR WITH DR. ROSS.  You will receive a reminder letter in the mail two months in advance. If you don't receive a letter, please call our office to schedule the follow-up appointment.  

## 2014-09-09 DIAGNOSIS — M1710 Unilateral primary osteoarthritis, unspecified knee: Secondary | ICD-10-CM | POA: Diagnosis not present

## 2014-09-09 DIAGNOSIS — M25519 Pain in unspecified shoulder: Secondary | ICD-10-CM | POA: Diagnosis not present

## 2014-09-23 DIAGNOSIS — R220 Localized swelling, mass and lump, head: Secondary | ICD-10-CM | POA: Diagnosis not present

## 2014-09-23 DIAGNOSIS — R431 Parosmia: Secondary | ICD-10-CM | POA: Diagnosis not present

## 2014-10-29 DIAGNOSIS — L820 Inflamed seborrheic keratosis: Secondary | ICD-10-CM | POA: Diagnosis not present

## 2014-10-29 DIAGNOSIS — L570 Actinic keratosis: Secondary | ICD-10-CM | POA: Diagnosis not present

## 2014-11-20 ENCOUNTER — Other Ambulatory Visit: Payer: Self-pay

## 2014-11-20 MED ORDER — AMLODIPINE BESYLATE 5 MG PO TABS: 5.0000 mg | ORAL_TABLET | Freq: Two times a day (BID) | ORAL | Status: DC

## 2014-11-20 MED ORDER — BENAZEPRIL HCL 20 MG PO TABS: 20.0000 mg | ORAL_TABLET | Freq: Two times a day (BID) | ORAL | Status: DC

## 2014-12-23 DIAGNOSIS — G629 Polyneuropathy, unspecified: Secondary | ICD-10-CM | POA: Diagnosis not present

## 2014-12-23 DIAGNOSIS — M4712 Other spondylosis with myelopathy, cervical region: Secondary | ICD-10-CM | POA: Diagnosis not present

## 2015-03-10 DIAGNOSIS — E782 Mixed hyperlipidemia: Secondary | ICD-10-CM | POA: Diagnosis not present

## 2015-03-10 DIAGNOSIS — I10 Essential (primary) hypertension: Secondary | ICD-10-CM | POA: Diagnosis not present

## 2015-03-10 DIAGNOSIS — M1990 Unspecified osteoarthritis, unspecified site: Secondary | ICD-10-CM | POA: Diagnosis not present

## 2015-03-10 DIAGNOSIS — K649 Unspecified hemorrhoids: Secondary | ICD-10-CM | POA: Diagnosis not present

## 2015-03-10 DIAGNOSIS — R7309 Other abnormal glucose: Secondary | ICD-10-CM | POA: Diagnosis not present

## 2015-03-10 DIAGNOSIS — Z1389 Encounter for screening for other disorder: Secondary | ICD-10-CM | POA: Diagnosis not present

## 2015-03-10 DIAGNOSIS — M5090 Cervical disc disorder, unspecified, unspecified cervical region: Secondary | ICD-10-CM | POA: Diagnosis not present

## 2015-03-10 DIAGNOSIS — N400 Enlarged prostate without lower urinary tract symptoms: Secondary | ICD-10-CM | POA: Diagnosis not present

## 2015-03-10 DIAGNOSIS — I4892 Unspecified atrial flutter: Secondary | ICD-10-CM | POA: Diagnosis not present

## 2015-03-10 DIAGNOSIS — Z0000 Encounter for general adult medical examination without abnormal findings: Secondary | ICD-10-CM | POA: Diagnosis not present

## 2015-04-13 DIAGNOSIS — R229 Localized swelling, mass and lump, unspecified: Secondary | ICD-10-CM | POA: Diagnosis not present

## 2015-04-13 DIAGNOSIS — W57XXXA Bitten or stung by nonvenomous insect and other nonvenomous arthropods, initial encounter: Secondary | ICD-10-CM | POA: Diagnosis not present

## 2015-05-20 DIAGNOSIS — L570 Actinic keratosis: Secondary | ICD-10-CM | POA: Diagnosis not present

## 2015-08-10 ENCOUNTER — Other Ambulatory Visit: Payer: Self-pay | Admitting: Internal Medicine

## 2015-09-10 ENCOUNTER — Ambulatory Visit (INDEPENDENT_AMBULATORY_CARE_PROVIDER_SITE_OTHER): Payer: Medicare Other | Admitting: Internal Medicine

## 2015-09-10 ENCOUNTER — Encounter: Payer: Self-pay | Admitting: Internal Medicine

## 2015-09-10 ENCOUNTER — Ambulatory Visit (INDEPENDENT_AMBULATORY_CARE_PROVIDER_SITE_OTHER): Payer: Medicare Other

## 2015-09-10 VITALS — BP 126/70 | HR 55 | Ht 71.0 in | Wt 251.4 lb

## 2015-09-10 DIAGNOSIS — R001 Bradycardia, unspecified: Secondary | ICD-10-CM

## 2015-09-10 DIAGNOSIS — I4892 Unspecified atrial flutter: Secondary | ICD-10-CM | POA: Diagnosis not present

## 2015-09-10 DIAGNOSIS — E785 Hyperlipidemia, unspecified: Secondary | ICD-10-CM

## 2015-09-10 DIAGNOSIS — I10 Essential (primary) hypertension: Secondary | ICD-10-CM

## 2015-09-10 DIAGNOSIS — Z0000 Encounter for general adult medical examination without abnormal findings: Secondary | ICD-10-CM | POA: Diagnosis not present

## 2015-09-10 LAB — LIPID PANEL
CHOL/HDL RATIO: 4
CHOLESTEROL: 135 mg/dL (ref 0–200)
HDL: 31.3 mg/dL — ABNORMAL LOW (ref >39.00)
LDL CALC: 86 mg/dL (ref 0–99)
NonHDL: 103.8
TRIGLYCERIDES: 91 mg/dL (ref 0.0–149.0)
VLDL: 18.2 mg/dL (ref 0.0–40.0)

## 2015-09-10 LAB — CBC
HCT: 40.9 % (ref 39.0–52.0)
HEMOGLOBIN: 13.7 g/dL (ref 13.0–17.0)
MCHC: 33.4 g/dL (ref 30.0–36.0)
MCV: 97.6 fl (ref 78.0–100.0)
Platelets: 181 10*3/uL (ref 150.0–400.0)
RBC: 4.19 Mil/uL — ABNORMAL LOW (ref 4.22–5.81)
RDW: 13.8 % (ref 11.5–15.5)
WBC: 5.8 10*3/uL (ref 4.0–10.5)

## 2015-09-10 LAB — TSH: TSH: 0.92 u[IU]/mL (ref 0.35–4.50)

## 2015-09-10 LAB — BASIC METABOLIC PANEL
BUN: 17 mg/dL (ref 6–23)
CO2: 28 mEq/L (ref 19–32)
Calcium: 9.8 mg/dL (ref 8.4–10.5)
Chloride: 102 mEq/L (ref 96–112)
Creatinine, Ser: 0.98 mg/dL (ref 0.40–1.50)
GFR: 80.13 mL/min (ref >60.00)
Glucose, Bld: 114 mg/dL — ABNORMAL HIGH (ref 70–99)
Potassium: 4 mEq/L (ref 3.5–5.1)
SODIUM: 139 meq/L (ref 135–145)

## 2015-09-10 LAB — HEMOGLOBIN A1C: Hgb A1c MFr Bld: 5.5 % (ref 4.6–6.5)

## 2015-09-10 LAB — PSA: PSA: 0.49 ng/mL (ref 0.10–4.00)

## 2015-09-10 MED ORDER — METOPROLOL TARTRATE 25 MG PO TABS: 25.0000 mg | ORAL_TABLET | Freq: Two times a day (BID) | ORAL | Status: DC

## 2015-09-10 NOTE — Patient Instructions (Signed)
Your physician recommends that you continue on your current medications as directed. Please refer to the Current Medication list given to you today. Your physician recommends that you return for lab work in: TODAY (CBC, BMET, TSH, LIPIDS, HgA1C, PSA)  Your physician has recommended that you wear an event monitor. Event monitors are medical devices that record the heart's electrical activity. Doctors most often [place] these monitors to diagnose arrhythmias. Arrhythmias are problems with the speed or rhythm of the heartbeat. The monitor is a small, portable device. You can wear one while you do your normal daily activities. This is usually used to diagnose what is causing palpitations/syncope (passing out).  Your physician wants you to follow-up in: July 2017 WITH DR. Harrington Challenger.   You will receive a reminder letter in the mail two months in advance. If you don't receive a letter, please call our office to schedule the follow-up appointment.

## 2015-09-10 NOTE — Progress Notes (Signed)
Cardiology Office Note   Date:  09/10/2015   ID:  Tony, [name] 17-Feb-1946, MRN [id number]  PCP:  [name], MD  Cardiologist:   [name], MD   No chief complaint on file.  F/u of intermitt atrial flutter     History of Present Illness: [name] is a 69 y.o. male with a history ofPatient is a 69 year old with a history of parox atrial flutter, HTN, HL. I saw him in Fall 2014  Pt had a spell of atrial flutter in 2015  Went to ER  Conveted to SR on own  Has had spells of high bp and low HR   Has had spells of intermiitt PND like spells  Very mild  Will have a startle in bed  Has been tested for sleep apnea in the past  Does not have  Doesn't sleep well due to back problems   No CP  NoSOB Remains active    Current Outpatient Prescriptions  Medication Sig Dispense Refill  . amLODipine (NORVASC) 5 MG tablet TAKE 1 TABLET BY MOUTH TWICE DAILY 60 tablet 0  . aspirin EC 81 MG tablet Take 81 mg by mouth daily.    . benazepril (LOTENSIN) 20 MG tablet TAKE 1 TABLET BY MOUTH TWICE DAILY 60 tablet 0  . celecoxib (CELEBREX) 200 MG capsule Take 200 mg by mouth daily as needed for mild pain.     . hydrochlorothiazide (,MICROZIDE/HYDRODIURIL,) 12.5 MG capsule Take 12.5 mg by mouth daily.      Marland Kitchen HYDROcodone-acetaminophen (NORCO/VICODIN) 5-325 MG per tablet Take 1 tablet by mouth every 6 (six) hours as needed for moderate pain.    . Multiple Vitamin (MULTIVITAMIN) tablet Take 1 tablet by mouth daily.    Marland Kitchen oxyCODONE-acetaminophen (PERCOCET) 10-325 MG per tablet Take 1 tablet by mouth every 6 (six) hours as needed for pain. As needed    . vitamin B-12 (CYANOCOBALAMIN) 500 MCG tablet Take 500 mcg by mouth daily.    . vitamin C (ASCORBIC ACID) 500 MG tablet Take 500 mg by mouth daily.    . metoprolol (LOPRESSOR) 25 MG tablet Take 1 tablet (25 mg total) by mouth 2 (two) times daily. (Patient not taking: Reported on 09/10/2015) 60 tablet 0   No current facility-administered  medications for this visit.    Allergies:   Niacin   Past Medical History  Diagnosis Date  . Paroxysmal atrial flutter   . Dyslipidemia   . HTN (hypertension)   . Diverticulosis of colon (without mention of hemorrhage) 2009    Colonoscopy  . Hx of colonic polyps 2009    Colonoscopy(Hyperplastic)  . Arthritis   . Diplopia 07/10/2013  . 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 Obesity     Past Surgical History  Procedure Laterality Date  . Cervical fusion  1996  . Replacement total knee  2005    left  . Colon resection  2007  . Synovial cyst excision      lumbar spine  . Tonsillectomy       Social History:  The patient  reports that he has never smoked. He has never used smokeless tobacco. He reports that he does not drink alcohol or use illicit drugs.   Family History:  The patient's family history includes Coronary artery disease in his father and mother. There is no history of Colon cancer or Stomach cancer.    ROS:  Please see the history of present illness. All other systems are reviewed  and  Negative to the above problem except as noted.    PHYSICAL EXAM: VS:  BP 126/70 mmHg  Pulse 55  Ht 5\' 11"  (1.803 m)  Wt 251 lb 6.4 oz (114.034 kg)  BMI 35.08 kg/m2  GEN: Well nourished, well developed, in no acute distress HEENT: normal Neck: no JVD, carotid bruits, or masses Cardiac: RRR; no murmurs, rubs, or gallops,no edema  Respiratory:  clear to auscultation bilaterally, normal work of breathing GI: soft, nontender, nondistended, + BS  No hepatomegaly  MS: no deformity Moving all extremities   Skin: warm and dry, no rash Neuro:  Strength and sensation are intact Psych: euthymic mood, full affect   EKG:  EKG is ordered today.  SB  55 with occasional PAC     Lipid Panel    Component Value Date/Time   CHOL 161 09/05/2014 0938   TRIG 145.0 09/05/2014 0938   HDL 27.50* 09/05/2014 0938   CHOLHDL 6 09/05/2014 0938   VLDL 29.0 09/05/2014 0938   LDLCALC 105*  09/05/2014 0938      Wt Readings from Last 3 Encounters:  09/10/15 251 lb 6.4 oz (114.034 kg)  09/05/14 255 lb (115.667 kg)  11/12/13 252 lb 8 oz (114.533 kg)      ASSESSMENT AND PLAN:  1  Rhythm Pt has had one episode of atrial flutter since I saw him  He is very symptomatic  Hears heart beating. I am not sure about spells of low HR   I would set him up for an event monitor to try to document I would not change medicines for now.    2.  HTN  Mostly good control  Some high readings (when HR low)Will check labs today    3  HL  Cehck today    Encouraged him to stay active   F/U in July   Will f/u on monitor results     Signed, [name], MD  09/10/2015 9:06 AM    [address], [place], [place]  [address] Phone: [phone]; Fax: [phone]

## 2015-09-11 ENCOUNTER — Other Ambulatory Visit: Payer: Self-pay | Admitting: Internal Medicine

## 2015-09-11 DIAGNOSIS — Z23 Encounter for immunization: Secondary | ICD-10-CM | POA: Diagnosis not present

## 2015-09-11 DIAGNOSIS — R109 Unspecified abdominal pain: Secondary | ICD-10-CM | POA: Diagnosis not present

## 2015-09-11 DIAGNOSIS — I4892 Unspecified atrial flutter: Secondary | ICD-10-CM | POA: Diagnosis not present

## 2015-09-11 DIAGNOSIS — E782 Mixed hyperlipidemia: Secondary | ICD-10-CM | POA: Diagnosis not present

## 2015-09-11 DIAGNOSIS — I10 Essential (primary) hypertension: Secondary | ICD-10-CM | POA: Diagnosis not present

## 2015-09-11 DIAGNOSIS — R1030 Lower abdominal pain, unspecified: Secondary | ICD-10-CM

## 2015-09-17 ENCOUNTER — Ambulatory Visit
Admission: RE | Admit: 2015-09-17 | Discharge: 2015-09-17 | Disposition: A | Discharge status: Home / Self Care | Payer: Medicare Other | Source: Ambulatory Visit | Attending: Internal Medicine | Admitting: Internal Medicine

## 2015-09-17 ENCOUNTER — Other Ambulatory Visit: Payer: Medicare Other

## 2015-09-17 DIAGNOSIS — R1030 Lower abdominal pain, unspecified: Secondary | ICD-10-CM | POA: Diagnosis not present

## 2015-09-21 ENCOUNTER — Other Ambulatory Visit: Payer: Self-pay | Admitting: Internal Medicine

## 2015-11-19 DIAGNOSIS — D485 Neoplasm of uncertain behavior of skin: Secondary | ICD-10-CM | POA: Diagnosis not present

## 2015-11-19 DIAGNOSIS — L919 Hypertrophic disorder of the skin, unspecified: Secondary | ICD-10-CM | POA: Diagnosis not present

## 2015-11-19 DIAGNOSIS — D1801 Hemangioma of skin and subcutaneous tissue: Secondary | ICD-10-CM | POA: Diagnosis not present

## 2015-12-08 DIAGNOSIS — K648 Other hemorrhoids: Secondary | ICD-10-CM | POA: Diagnosis not present

## 2015-12-15 ENCOUNTER — Encounter: Payer: Self-pay | Admitting: Gastroenterology

## 2015-12-30 ENCOUNTER — Other Ambulatory Visit: Payer: Self-pay | Admitting: Surgery

## 2015-12-30 DIAGNOSIS — K602 Anal fissure, unspecified: Secondary | ICD-10-CM | POA: Diagnosis not present

## 2016-01-08 ENCOUNTER — Telehealth: Payer: Self-pay | Admitting: Internal Medicine

## 2016-01-08 DIAGNOSIS — M1711 Unilateral primary osteoarthritis, right knee: Secondary | ICD-10-CM | POA: Diagnosis not present

## 2016-01-08 DIAGNOSIS — M47816 Spondylosis without myelopathy or radiculopathy, lumbar region: Secondary | ICD-10-CM | POA: Diagnosis not present

## 2016-01-08 DIAGNOSIS — M25561 Pain in right knee: Secondary | ICD-10-CM | POA: Diagnosis not present

## 2016-01-08 NOTE — Telephone Encounter (Signed)
Walk in pt form-[facility]-Clearance dropped off Ross back Monday

## 2016-01-11 ENCOUNTER — Telehealth: Payer: Self-pay | Admitting: *Deleted

## 2016-01-11 NOTE — Telephone Encounter (Signed)
Received clearance request, date pending for right TK. Signed by Dr. Harrington Challenger, placed in nurse fax bin in medical records to be faxed.

## 2016-01-12 ENCOUNTER — Encounter (HOSPITAL_COMMUNITY): Payer: Self-pay | Admitting: *Deleted

## 2016-01-12 MED ORDER — CEFAZOLIN SODIUM-DEXTROSE 2-3 GM-% IV SOLR
2.0000 g | INTRAVENOUS | Status: AC
  Administered 2016-01-13: 2 g via INTRAVENOUS
  Filled 2016-01-12: qty 50

## 2016-01-12 NOTE — H&P (Signed)
  Sonia Side A. Lins 12/30/2015 1:17 PM Location: [facility] Patient #: [id number] DOB: 1946-09-23 Married / Language: English / Race: White Male   History of Present Illness (Romanita Fager A. Ninfa Linden MD; 12/30/2015 1:44 PM) Patient words: New-hems.  The patient is a 70 year old male who presents with a complaint of anal problems. This is a pleasant gentleman referred by Dr. Josetta Huddle for anal pain. He has had a previous hemorrhoidectomy by Dr. Dalbert Batman about 25 years ago. He has had anal fissures in the past as well. Most recently, he has had significant perianal discomfort. He denies any bleeding. He has not been particularly constipated. He has been placed on steroid cream and did improve however the pain has returned. He has no issues with control of stool or gas. He is otherwise without complaints.   Allergies (Chemira Jones, CMA; 12/30/2015 1:20 PM) Niacin (Antihyperlipidemic) *ANTIHYPERLIPIDEMICS*  Medication History (Chemira Jones, CMA; 12/30/2015 1:21 PM) Oxycodone-Acetaminophen (10-325MG  Tablet, Oral) Active. AmLODIPine Besylate (5MG  Tablet, Oral) Active. Benazepril HCl (20MG  Tablet, Oral) Active. Celecoxib (200MG  Capsule, Oral) Active. HydroCHLOROthiazide (12.5MG  Capsule, Oral) Active. Multivitamin Adult (Oral) Active. Vitamin B12 (1000MCG Tablet ER, Oral) Active. Vitamin C (500MG  Capsule, Oral) Active. Fish Oil (1000MG  Capsule, Oral) Active. Medications Reconciled    Review of Systems Malachi Bonds CMA; 12/30/2015 1:17 PM) Skin Not Present- Change in Wart/Mole, Dryness, Hives, Jaundice, New Lesions, Non-Healing Wounds, Rash and Ulcer. HEENT Not Present- Earache, Hearing Loss, Hoarseness, Nose Bleed, Oral Ulcers, Ringing in the Ears, Seasonal Allergies, Sinus Pain, Sore Throat, Visual Disturbances, Wears glasses/contact lenses and Yellow Eyes. Respiratory Not Present- Bloody sputum, Chronic Cough, Difficulty Breathing, Snoring and  Wheezing. Cardiovascular Not Present- Chest Pain, Difficulty Breathing Lying Down, Leg Cramps, Palpitations, Rapid Heart Rate, Shortness of Breath and Swelling of Extremities.  Vitals (Chemira Jones CMA; 12/30/2015 1:19 PM) 12/30/2015 1:19 PM Weight: 259.8 lb Height: 71in Body Surface Area: 2.36 m Body Mass Index: 36.23 kg/m  Temp.: 98.63F(Oral)  Pulse: 74 (Regular)  BP: 150/80 (Sitting, Left Arm, Standard)   Physical Exam (Ondra Deboard A. Ninfa Linden MD; 12/30/2015 1:45 PM) The physical exam findings are as follows: Generally well in appearance Lungs clear bilaterally CV RRR Abdomen soft, NT Rectal: . He has multiple external skin tags. There is a visible posterior midline anal fissure as well as one to the left. There is also an enlarged external hemorrhoid going up into the anal canal. There were no intra-anal masses.    Assessment & Plan (Tyriek Hofman A. Ninfa Linden MD; 12/30/2015 1:46 PM)  ANAL FISSURE (K60.2)  Impression: I believe this discomfort is more from an anal fissure than the hemorrhoid. I discussed this with him in detail. I discussed conservative management with stool softener, sitz bath, and diltiazem cream to relax the muscle versus examination under anesthesia with possible sphincterotomy as well as removal of the hemorrhoid. He will also try conservative measures first so I will place him on the diltiazem cream. I will see him back in 3 weeks and if he is not improving I will schedule his surgery. He will call back and see me sooner if he worsens   Addendum:  He called the office back requesting surgery.  I discussed the risks of exam under anesthesia with possible sphincterotomy and possible hemorrhoidectomy.  These include but are not limited to bleeding, infection, need for further surgery, incontinence of gas or stool, having chronic open wounds, etc.  I discussed potential painful postop recovery.  He agrees to proceed.

## 2016-01-13 ENCOUNTER — Encounter (HOSPITAL_COMMUNITY)
Admission: RE | Disposition: A | Discharge status: Home / Self Care | Payer: Self-pay | Source: Ambulatory Visit | Attending: Surgery

## 2016-01-13 ENCOUNTER — Ambulatory Visit (HOSPITAL_COMMUNITY): Payer: Medicare Other | Admitting: Anesthesiology

## 2016-01-13 ENCOUNTER — Ambulatory Visit (HOSPITAL_COMMUNITY)
Admission: RE | Admit: 2016-01-13 | Discharge: 2016-01-13 | Disposition: A | Discharge status: Home / Self Care | Payer: Medicare Other | Source: Ambulatory Visit | Attending: Surgery | Admitting: Surgery

## 2016-01-13 ENCOUNTER — Encounter (HOSPITAL_COMMUNITY): Payer: Self-pay | Admitting: *Deleted

## 2016-01-13 DIAGNOSIS — Z7952 Long term (current) use of systemic steroids: Secondary | ICD-10-CM | POA: Diagnosis not present

## 2016-01-13 DIAGNOSIS — E785 Hyperlipidemia, unspecified: Secondary | ICD-10-CM | POA: Diagnosis not present

## 2016-01-13 DIAGNOSIS — Z7982 Long term (current) use of aspirin: Secondary | ICD-10-CM | POA: Insufficient documentation

## 2016-01-13 DIAGNOSIS — K644 Residual hemorrhoidal skin tags: Secondary | ICD-10-CM | POA: Diagnosis not present

## 2016-01-13 DIAGNOSIS — K645 Perianal venous thrombosis: Secondary | ICD-10-CM | POA: Diagnosis not present

## 2016-01-13 DIAGNOSIS — I10 Essential (primary) hypertension: Secondary | ICD-10-CM | POA: Diagnosis not present

## 2016-01-13 DIAGNOSIS — M1990 Unspecified osteoarthritis, unspecified site: Secondary | ICD-10-CM | POA: Diagnosis not present

## 2016-01-13 DIAGNOSIS — Z79899 Other long term (current) drug therapy: Secondary | ICD-10-CM | POA: Insufficient documentation

## 2016-01-13 DIAGNOSIS — E669 Obesity, unspecified: Secondary | ICD-10-CM | POA: Insufficient documentation

## 2016-01-13 DIAGNOSIS — Z6836 Body mass index (BMI) 36.0-36.9, adult: Secondary | ICD-10-CM | POA: Diagnosis not present

## 2016-01-13 DIAGNOSIS — K602 Anal fissure, unspecified: Secondary | ICD-10-CM | POA: Diagnosis present

## 2016-01-13 HISTORY — PX: HEMORRHOID SURGERY: SHX153

## 2016-01-13 HISTORY — DX: Cardiac arrhythmia, unspecified: I49.9

## 2016-01-13 LAB — BASIC METABOLIC PANEL
ANION GAP: 13 (ref 5–15)
BUN: 22 mg/dL — ABNORMAL HIGH (ref 6–20)
CALCIUM: 9.7 mg/dL (ref 8.9–10.3)
CHLORIDE: 100 mmol/L — AB (ref 101–111)
CO2: 24 mmol/L (ref 22–32)
Creatinine, Ser: 1.08 mg/dL (ref 0.61–1.24)
GFR calc non Af Amer: >60 mL/min (ref >60)
Glucose, Bld: 116 mg/dL — ABNORMAL HIGH (ref 65–99)
POTASSIUM: 4.5 mmol/L (ref 3.5–5.1)
Sodium: 137 mmol/L (ref 135–145)

## 2016-01-13 LAB — CBC
HCT: 39.9 % (ref 39.0–52.0)
HEMOGLOBIN: 13.8 g/dL (ref 13.0–17.0)
MCH: 33.4 pg (ref 26.0–34.0)
MCHC: 34.6 g/dL (ref 30.0–36.0)
MCV: 96.6 fL (ref 78.0–100.0)
Platelets: 181 10*3/uL (ref 150–400)
RBC: 4.13 MIL/uL — AB (ref 4.22–5.81)
RDW: 13.5 % (ref 11.5–15.5)
WBC: 7.2 10*3/uL (ref 4.0–10.5)

## 2016-01-13 SURGERY — EXAM UNDER ANESTHESIA
Anesthesia: General | Site: Anus

## 2016-01-13 MED ORDER — ACETAMINOPHEN 650 MG RE SUPP: 650.0000 mg | RECTAL | Status: DC | PRN

## 2016-01-13 MED ORDER — SODIUM CHLORIDE 0.9% FLUSH: 3.0000 mL | INTRAVENOUS | Status: DC | PRN

## 2016-01-13 MED ORDER — DEXAMETHASONE SODIUM PHOSPHATE 4 MG/ML IJ SOLN
INTRAMUSCULAR | Status: AC
  Filled 2016-01-13: qty 1

## 2016-01-13 MED ORDER — SODIUM CHLORIDE 0.9 % IV SOLN: 250.0000 mL | INTRAVENOUS | Status: DC | PRN

## 2016-01-13 MED ORDER — BUPIVACAINE LIPOSOME 1.3 % IJ SUSP
INTRAMUSCULAR | Status: DC | PRN
  Administered 2016-01-13: 20 mL

## 2016-01-13 MED ORDER — DIBUCAINE 1 % RE OINT
TOPICAL_OINTMENT | RECTAL | Status: DC | PRN
  Administered 2016-01-13: 1 via RECTAL

## 2016-01-13 MED ORDER — DEXAMETHASONE SODIUM PHOSPHATE 4 MG/ML IJ SOLN
INTRAMUSCULAR | Status: DC | PRN
  Administered 2016-01-13: 4 mg via INTRAVENOUS

## 2016-01-13 MED ORDER — LIDOCAINE HCL (CARDIAC) 20 MG/ML IV SOLN
INTRAVENOUS | Status: AC
  Filled 2016-01-13: qty 5

## 2016-01-13 MED ORDER — MEPERIDINE HCL 25 MG/ML IJ SOLN: 6.2500 mg | INTRAMUSCULAR | Status: DC | PRN

## 2016-01-13 MED ORDER — MIDAZOLAM HCL 2 MG/2ML IJ SOLN
INTRAMUSCULAR | Status: AC
  Filled 2016-01-13: qty 2

## 2016-01-13 MED ORDER — HYDROMORPHONE HCL 1 MG/ML IJ SOLN: 0.2500 mg | INTRAMUSCULAR | Status: DC | PRN

## 2016-01-13 MED ORDER — FENTANYL CITRATE (PF) 100 MCG/2ML IJ SOLN
INTRAMUSCULAR | Status: DC | PRN
  Administered 2016-01-13: 50 ug via INTRAVENOUS

## 2016-01-13 MED ORDER — DIBUCAINE 1 % RE OINT
TOPICAL_OINTMENT | RECTAL | Status: AC
  Filled 2016-01-13: qty 28

## 2016-01-13 MED ORDER — PROPOFOL 10 MG/ML IV BOLUS
INTRAVENOUS | Status: DC | PRN
  Administered 2016-01-13: 170 mg via INTRAVENOUS

## 2016-01-13 MED ORDER — HYDROCODONE-ACETAMINOPHEN 7.5-325 MG PO TABS: 1.0000 | ORAL_TABLET | Freq: Once | ORAL | Status: DC | PRN

## 2016-01-13 MED ORDER — MIDAZOLAM HCL 5 MG/5ML IJ SOLN
INTRAMUSCULAR | Status: DC | PRN
  Administered 2016-01-13: 2 mg via INTRAVENOUS

## 2016-01-13 MED ORDER — SODIUM CHLORIDE 0.9% FLUSH: 3.0000 mL | Freq: Two times a day (BID) | INTRAVENOUS | Status: DC

## 2016-01-13 MED ORDER — BUPIVACAINE LIPOSOME 1.3 % IJ SUSP
20.0000 mL | INTRAMUSCULAR | Status: DC
  Filled 2016-01-13: qty 20

## 2016-01-13 MED ORDER — LIDOCAINE 5 % EX OINT: 1.0000 "application " | TOPICAL_OINTMENT | Freq: Three times a day (TID) | CUTANEOUS | Status: DC | PRN

## 2016-01-13 MED ORDER — PROMETHAZINE HCL 25 MG/ML IJ SOLN: 6.2500 mg | INTRAMUSCULAR | Status: DC | PRN

## 2016-01-13 MED ORDER — ONDANSETRON HCL 4 MG/2ML IJ SOLN
INTRAMUSCULAR | Status: AC
  Filled 2016-01-13: qty 2

## 2016-01-13 MED ORDER — LIDOCAINE HCL (CARDIAC) 20 MG/ML IV SOLN
INTRAVENOUS | Status: DC | PRN
  Administered 2016-01-13: 80 mg via INTRAVENOUS

## 2016-01-13 MED ORDER — MORPHINE SULFATE (PF) 2 MG/ML IV SOLN: 1.0000 mg | INTRAVENOUS | Status: DC | PRN

## 2016-01-13 MED ORDER — SODIUM CHLORIDE 0.9 % IJ SOLN
INTRAMUSCULAR | Status: DC | PRN
  Administered 2016-01-13: 10 mL via INTRAVENOUS

## 2016-01-13 MED ORDER — ACETAMINOPHEN 325 MG PO TABS: 650.0000 mg | ORAL_TABLET | ORAL | Status: DC | PRN

## 2016-01-13 MED ORDER — BUPIVACAINE-EPINEPHRINE (PF) 0.25% -1:200000 IJ SOLN
INTRAMUSCULAR | Status: AC
  Filled 2016-01-13: qty 30

## 2016-01-13 MED ORDER — FENTANYL CITRATE (PF) 250 MCG/5ML IJ SOLN
INTRAMUSCULAR | Status: AC
  Filled 2016-01-13: qty 5

## 2016-01-13 MED ORDER — ONDANSETRON HCL 4 MG/2ML IJ SOLN
INTRAMUSCULAR | Status: DC | PRN
  Administered 2016-01-13: 4 mg via INTRAVENOUS

## 2016-01-13 MED ORDER — LACTATED RINGERS IV SOLN
INTRAVENOUS | Status: DC
  Administered 2016-01-13: 08:00:00 via INTRAVENOUS

## 2016-01-13 MED ORDER — OXYCODONE HCL 5 MG PO TABS: 5.0000 mg | ORAL_TABLET | ORAL | Status: DC | PRN

## 2016-01-13 SURGICAL SUPPLY — 42 items
BLADE SURG 15 STRL LF DISP TIS (BLADE) ×2 IMPLANT
BLADE SURG 15 STRL SS (BLADE) ×2
CANISTER SUCTION 2500CC (MISCELLANEOUS) ×4 IMPLANT
COVER SURGICAL LIGHT HANDLE (MISCELLANEOUS) ×4 IMPLANT
DECANTER SPIKE VIAL GLASS SM (MISCELLANEOUS) ×4 IMPLANT
DRAPE PROXIMA HALF (DRAPES) ×4 IMPLANT
DRAPE UTILITY XL STRL (DRAPES) ×8 IMPLANT
DRSG PAD ABDOMINAL 8X10 ST (GAUZE/BANDAGES/DRESSINGS) ×4 IMPLANT
ELECT CAUTERY BLADE 6.4 (BLADE) ×4 IMPLANT
ELECT REM PT RETURN 9FT ADLT (ELECTROSURGICAL) ×4
ELECTRODE REM PT RTRN 9FT ADLT (ELECTROSURGICAL) ×2 IMPLANT
GAUZE SPONGE 4X4 12PLY STRL (GAUZE/BANDAGES/DRESSINGS) ×4 IMPLANT
GAUZE SPONGE 4X4 16PLY XRAY LF (GAUZE/BANDAGES/DRESSINGS) ×8 IMPLANT
GLOVE BIOGEL PI IND STRL 7.0 (GLOVE) ×4 IMPLANT
GLOVE BIOGEL PI INDICATOR 7.0 (GLOVE) ×4
GLOVE SURG SIGNA 7.5 PF LTX (GLOVE) ×4 IMPLANT
GLOVE SURG SS PI 6.5 STRL IVOR (GLOVE) ×4 IMPLANT
GOWN STRL REUS W/ TWL LRG LVL3 (GOWN DISPOSABLE) ×2 IMPLANT
GOWN STRL REUS W/ TWL XL LVL3 (GOWN DISPOSABLE) ×2 IMPLANT
GOWN STRL REUS W/TWL LRG LVL3 (GOWN DISPOSABLE) ×2
GOWN STRL REUS W/TWL XL LVL3 (GOWN DISPOSABLE) ×2
KIT BASIN OR (CUSTOM PROCEDURE TRAY) ×4 IMPLANT
KIT ROOM TURNOVER OR (KITS) ×4 IMPLANT
NEEDLE HYPO 25GX1X1/2 BEV (NEEDLE) ×4 IMPLANT
NS IRRIG 1000ML POUR BTL (IV SOLUTION) ×4 IMPLANT
PACK LITHOTOMY IV (CUSTOM PROCEDURE TRAY) ×4 IMPLANT
PAD ABD 8X10 STRL (GAUZE/BANDAGES/DRESSINGS) ×4 IMPLANT
PAD ARMBOARD 7.5X6 YLW CONV (MISCELLANEOUS) ×4 IMPLANT
PENCIL BUTTON HOLSTER BLD 10FT (ELECTRODE) ×4 IMPLANT
SPONGE GAUZE 4X4 12PLY STER LF (GAUZE/BANDAGES/DRESSINGS) ×4 IMPLANT
SPONGE SURGIFOAM ABS GEL 100 (HEMOSTASIS) IMPLANT
SURGILUBE 2OZ TUBE FLIPTOP (MISCELLANEOUS) ×4 IMPLANT
SUT CHROMIC 2 0 SH (SUTURE) ×8 IMPLANT
SYR BULB 3OZ (MISCELLANEOUS) ×4 IMPLANT
SYR CONTROL 10ML LL (SYRINGE) ×4 IMPLANT
TOWEL OR 17X24 6PK STRL BLUE (TOWEL DISPOSABLE) ×4 IMPLANT
TOWEL OR 17X26 10 PK STRL BLUE (TOWEL DISPOSABLE) ×4 IMPLANT
TRAY PROCTOSCOPIC FIBER OPTIC (SET/KITS/TRAYS/PACK) IMPLANT
TUBE CONNECTING 12'X1/4 (SUCTIONS) ×1
TUBE CONNECTING 12X1/4 (SUCTIONS) ×3 IMPLANT
UNDERPAD 30X30 INCONTINENT (UNDERPADS AND DIAPERS) ×4 IMPLANT
YANKAUER SUCT BULB TIP NO VENT (SUCTIONS) ×4 IMPLANT

## 2016-01-13 NOTE — Anesthesia Postprocedure Evaluation (Signed)
Anesthesia Post Note  Patient: SHADRACK CARABELLO  Procedure(s) Performed: Procedure(s) (LRB): EXAM UNDER ANESTHESIA (N/A) HEMORRHOIDECTOMY AND REMOVAL OF ANAL SKIN TAG (N/A)  Patient location during evaluation: PACU Anesthesia Type: General Level of consciousness: awake and alert Pain management: pain level controlled Vital Signs Assessment: post-procedure vital signs reviewed and stable Respiratory status: spontaneous breathing, nonlabored ventilation and respiratory function stable Cardiovascular status: blood pressure returned to baseline and stable Postop Assessment: no signs of nausea or vomiting Anesthetic complications: no    Last Vitals:  Filed Vitals:   01/13/16 1130 01/13/16 1136  BP: 119/62 131/55  Pulse: 54 55  Temp:  36.4 C  Resp: 10 12    Last Pain:  Filed Vitals:   01/13/16 1141  PainSc: 2                  Elby Blackwelder A

## 2016-01-13 NOTE — Anesthesia Preprocedure Evaluation (Addendum)
Anesthesia Evaluation  Patient identified by MRN, date of birth, ID band Patient awake    Reviewed: Allergy & Precautions, NPO status , Patient's Chart, lab work & pertinent test results, reviewed documented beta blocker date and time   Airway Mallampati: I  TM Distance: >3 FB Neck ROM: Limited    Dental  (+) Teeth Intact, Caps, Partial Upper, Partial Lower   Pulmonary    Pulmonary exam normal breath sounds clear to auscultation       Cardiovascular hypertension, Pt. on medications and Pt. on home beta blockers Normal cardiovascular exam+ dysrhythmias Atrial Fibrillation  Rhythm:Regular Rate:Normal     Neuro/Psych CN III injury Diplopia   Neuromuscular disease negative psych ROS   GI/Hepatic Neg liver ROS, Anal fissure Thrombosed external hemorrhoids Hx/o Diverticulosis   Endo/Other  Obesity Hyperlipidemia  Renal/GU negative Renal ROS  negative genitourinary   Musculoskeletal  (+) Arthritis , Osteoarthritis,    Abdominal (+) + obese,   Peds  Hematology negative hematology ROS (+)   Anesthesia Other Findings Permanent upper and lower bridges  Reproductive/Obstetrics                         Anesthesia Physical Anesthesia Plan  ASA: II  Anesthesia Plan: General   Post-op Pain Management:    Induction: Intravenous  Airway Management Planned: Oral ETT and LMA  Additional Equipment:   Intra-op Plan:   Post-operative Plan: Extubation in OR  Informed Consent: I have reviewed the patients History and Physical, chart, labs and discussed the procedure including the risks, benefits and alternatives for the proposed anesthesia with the patient or authorized representative who has indicated his/her understanding and acceptance.   Dental advisory given  Plan Discussed with: CRNA, Anesthesiologist and Surgeon  Anesthesia Plan Comments:        Anesthesia Quick Evaluation

## 2016-01-13 NOTE — Interval H&P Note (Signed)
History and Physical Interval Note: no change in H and P  01/13/2016 8:41 AM  [name]  has presented today for surgery, with the diagnosis of Anal fissure, thrombosed external hemorrohid  The various methods of treatment have been discussed with the patient and family. After consideration of risks, benefits and other options for treatment, the patient has consented to  Procedure(s): EXAM UNDER ANESTHESIA (N/A) SPHINCTEROTOMY (N/A) HEMORRHOIDECTOMY (N/A) as a surgical intervention .  The patient's history has been reviewed, patient examined, no change in status, stable for surgery.  I have reviewed the patient's chart and labs.  Questions were answered to the patient's satisfaction.     [name] A

## 2016-01-13 NOTE — Op Note (Signed)
EXAM UNDER ANESTHESIA, HEMORRHOIDECTOMY AND REMOVAL OF ANAL SKIN TAG  Procedure Note  [name] 01/13/2016   Pre-op Diagnosis: Anal fissure, thrombosed external hemorrhoid     Post-op Diagnosis: enlarged and thrombosed external hemorrhoid and external skin tags  Procedure(s): EXAM UNDER ANESTHESIA EXTERNAL HEMORRHOIDECTOMY AND REMOVAL OF ANAL SKIN TAG  Surgeon(s): Coralie Keens, MD  Anesthesia: General  Staff:  Circulator: Rosanne Sack, RN; Milas Kocher, RN Scrub Person: Christen Bame, RN  Estimated Blood Loss: Minimal               Specimens: sent to path          [facility]   Date: 01/13/2016  Time: 10:37 AM

## 2016-01-13 NOTE — Transfer of Care (Signed)
Immediate Anesthesia Transfer of Care Note  Patient: [name]  Procedure(s) Performed: Procedure(s): EXAM UNDER ANESTHESIA (N/A) HEMORRHOIDECTOMY AND REMOVAL OF ANAL SKIN TAG (N/A)  Patient Location: PACU  Anesthesia Type:General  Level of Consciousness: awake, alert  and oriented  Airway & Oxygen Therapy: Patient Spontanous Breathing and Patient connected to nasal cannula oxygen  Post-op Assessment: Report given to RN, Post -op Vital signs reviewed and stable and Patient moving all extremities  Post vital signs: Reviewed and stable  Last Vitals:  Filed Vitals:   01/13/16 0756  BP: 143/47  Pulse: 97  Temp: 36.4 C  Resp: 20    Complications: No apparent anesthesia complications

## 2016-01-13 NOTE — Op Note (Signed)
[name], Tony NO.:  [id number]  MEDICAL RECORD NO.:  [id number]  LOCATION:  [facility]                         FACILITY:  [facility]  PHYSICIAN:  Coralie Keens, M.D. DATE OF BIRTH:  1946/12/17  DATE OF PROCEDURE:  01/13/2016 DATE OF DISCHARGE:                              OPERATIVE REPORT   PREOPERATIVE DIAGNOSIS:  Anal fissure and painful external hemorrhoids.  POSTOPERATIVE DIAGNOSIS:  Enlarged painful and thrombosed external hemorrhoid with external skin tags.  PROCEDURE: 1. Examination under anesthesia. 2. External hemorrhoidectomy and removal of anal skin tag.  SURGEON:  Coralie Keens, M.D.  ANESTHESIA:  General with injectable Exparel.  ESTIMATED BLOOD LOSS:  Minimal.  INDICATIONS:  This is a 70 year old gentleman who has had a hemorrhoidectomy approximately 25 years ago.  He has been having increasing perianal discomfort.  It was felt like he may have a fissure on examination along with external hemorrhoid and skin tags.  He has failed conservative management, therefore decision was made to proceed to the operating room.  FINDINGS:  The patient had no further evidence of an anal fissure which had completely healed.  His sphincter tone appeared normal, therefore I did not do a sphincterotomy.  He had 1 large first degree external hemorrhoid which was pedunculated and may have been causing a lot of his discomfort as well as a thrombosed hemorrhoid as well.  There were also multiple skin tags.  I excised the thrombosed hemorrhoid and excise the anal skin tags.  PROCEDURE IN DETAIL:  The patient was brought to the operating room, identified as [name].  He was placed supine on the operating table and general anesthesia was induced.  His perianal area was then prepped and draped in usual sterile fashion.  I anesthetized the perianal area with Exparel.  I took circumferential inspection. Exteriorly, he had a large external hemorrhoidal  column just to the right of the posterior midline.  His previously identified fissure had completely healed.  There were also multiple skin tags and a small thrombosed hemorrhoid to left of the midline.  I inserted a retractor in the anal canal and inspected it circumferentially and saw no evidence of enlarged internal hemorrhoids or any other abnormalities.  At this point, I excised several small skin tags with electrocautery.  I also excised the first degree enlarged external hemorrhoid with the cautery as well and then opened up and drained the thrombosed hemorrhoid with the cautery.  I then closed the mucosal defect with interrupted 2-0 chromic sutures.  I injected more Exparel circumferentially.  Hemostasis appeared to be achieved.  No other abnormalities were again identified. At this point, the patient was extubated in operating room and taken in a stable condition to the recovery room.  All the counts were correct at the end of procedure.     Coralie Keens, M.D.     DB/MEDQ  D:  01/13/2016  T:  01/13/2016  Job:  [id number]

## 2016-01-13 NOTE — Discharge Instructions (Signed)
[facility] _______[facility] ° °RECTAL SURGERY POST OP INSTRUCTIONS: POST OP INSTRUCTIONS ° °Always review your discharge instruction sheet given to you by the facility where your surgery was performed. °IF YOU HAVE DISABILITY OR FAMILY LEAVE FORMS, YOU MUST BRING THEM TO THE OFFICE FOR PROCESSING.   °DO NOT GIVE THEM TO YOUR DOCTOR. ° °1. A  prescription for pain medication may be given to you upon discharge.  Take your pain medication as prescribed, if needed.  If narcotic pain medicine is not needed, then you may take acetaminophen (Tylenol) or ibuprofen (Advil) as needed. °2. Take your usually prescribed medications unless otherwise directed. °3. If you need a refill on your pain medication, please contact your pharmacy.  They will contact our office to request authorization. Prescriptions will not be filled after 5 pm or on week-ends. °4. You should follow a light diet the first 48 hours after arrival home, such as soup and crackers, etc.  Be sure to include lots of fluids daily.  Resume your normal diet 2-3 days after surgery.. °5. Most patients will experience some swelling and discomfort in the rectal area. Ice packs, reclining and warm tub soaks will help.  Swelling and discomfort can take several days to resolve.  °6. It is common to experience some constipation if taking pain medication after surgery.  Increasing fluid intake and taking a stool softener (such as Colace) will usually help or prevent this problem from occurring.  A mild laxative (Milk of Magnesia or Miralax) should be taken according to package directions if there are no bowel movements after 48 hours. °7. Unless discharge instructions indicate otherwise, leave your bandage dry and in place for 24 hours, or remove the bandage if you have a bowel movement. You may notice a small amount of bleeding with bowel movements for the first few days. You may have some packing in the rectum which will come out over the first day or two. You  will need to wear an absorbent pad or soft cotton gauze in your underwear until the drainage stops.it. °8. ACTIVITIES:  You may resume regular (light) daily activities beginning the next day--such as daily self-care, walking, climbing stairs--gradually increasing activities as tolerated.  You may have sexual intercourse when it is comfortable.  Refrain from any heavy lifting or straining until approved by your doctor. °a. You may drive when you are no longer taking prescription pain medication, you can comfortably wear a seatbelt, and you can safely maneuver your car and apply brakes. °b. RETURN TO WORK: : ____________________ °c.  °9. You should see your doctor in the office for a follow-up appointment approximately 2-3 weeks after your surgery.  Make sure that you call for this appointment within a day or two after you arrive home to insure a convenient appointment time. °10. OTHER INSTRUCTIONS:  __________________________________________________________________________________________________________________________________________________________________________________________  °WHEN TO CALL YOUR DOCTOR: °1. Fever over 101.0 °2. Inability to urinate °3. Nausea and/or vomiting °4. Extreme swelling or bruising °5. Continued bleeding from rectum. °6. Increased pain, redness, or drainage from the incision °7. Constipation ° °The clinic staff is available to answer your questions during regular business hours.  Please don’t hesitate to call and ask to speak to one of the nurses for clinical concerns.  If you have a medical emergency, go to the nearest emergency room or call 911.  A surgeon from [facility] is always on call at the hospital ° ° °[address], [address], [place], [place]  [address] ? °   [address] [address], [place], [place]   [address] °[phone] ? [phone] ? FAX [phone] °Web site: [email or web address] ° °

## 2016-01-13 NOTE — Anesthesia Procedure Notes (Signed)
Procedure Name: LMA Insertion Date/Time: 01/13/2016 10:06 AM Performed by: Rush Farmer E Pre-anesthesia Checklist: Patient identified, Emergency Drugs available, Suction available, Patient being monitored and Timeout performed Patient Re-evaluated:Patient Re-evaluated prior to inductionOxygen Delivery Method: Circle system utilized Preoxygenation: Pre-oxygenation with 100% oxygen Intubation Type: IV induction Ventilation: Mask ventilation without difficulty LMA: LMA inserted LMA Size: 4.0 Number of attempts: 1 Placement Confirmation: positive ETCO2 and breath sounds checked- equal and bilateral Tube secured with: Tape Dental Injury: Teeth and Oropharynx as per pre-operative assessment

## 2016-01-14 ENCOUNTER — Encounter (HOSPITAL_COMMUNITY): Payer: Self-pay | Admitting: Surgery

## 2016-01-18 DIAGNOSIS — M25561 Pain in right knee: Secondary | ICD-10-CM | POA: Diagnosis not present

## 2016-01-18 DIAGNOSIS — M1990 Unspecified osteoarthritis, unspecified site: Secondary | ICD-10-CM | POA: Diagnosis not present

## 2016-01-18 DIAGNOSIS — L509 Urticaria, unspecified: Secondary | ICD-10-CM | POA: Diagnosis not present

## 2016-01-18 DIAGNOSIS — Z01818 Encounter for other preprocedural examination: Secondary | ICD-10-CM | POA: Diagnosis not present

## 2016-01-22 ENCOUNTER — Other Ambulatory Visit: Payer: Self-pay | Admitting: Orthopedic Surgery

## 2016-02-03 ENCOUNTER — Other Ambulatory Visit: Payer: Self-pay | Admitting: Neurosurgery

## 2016-02-03 DIAGNOSIS — M7138 Other bursal cyst, other site: Secondary | ICD-10-CM

## 2016-02-05 DIAGNOSIS — M1711 Unilateral primary osteoarthritis, right knee: Secondary | ICD-10-CM | POA: Diagnosis not present

## 2016-02-13 ENCOUNTER — Ambulatory Visit
Admission: RE | Admit: 2016-02-13 | Discharge: 2016-02-13 | Disposition: A | Discharge status: Home / Self Care | Payer: Medicare Other | Source: Ambulatory Visit | Attending: Neurosurgery | Admitting: Neurosurgery

## 2016-02-13 DIAGNOSIS — M5136 Other intervertebral disc degeneration, lumbar region: Secondary | ICD-10-CM | POA: Diagnosis not present

## 2016-02-13 DIAGNOSIS — M7138 Other bursal cyst, other site: Secondary | ICD-10-CM

## 2016-02-24 DIAGNOSIS — M47896 Other spondylosis, lumbar region: Secondary | ICD-10-CM | POA: Diagnosis not present

## 2016-02-24 DIAGNOSIS — M5136 Other intervertebral disc degeneration, lumbar region: Secondary | ICD-10-CM | POA: Diagnosis not present

## 2016-02-29 DIAGNOSIS — G629 Polyneuropathy, unspecified: Secondary | ICD-10-CM | POA: Diagnosis not present

## 2016-02-29 DIAGNOSIS — Z9889 Other specified postprocedural states: Secondary | ICD-10-CM | POA: Diagnosis not present

## 2016-02-29 DIAGNOSIS — M47896 Other spondylosis, lumbar region: Secondary | ICD-10-CM | POA: Diagnosis not present

## 2016-02-29 DIAGNOSIS — Z79899 Other long term (current) drug therapy: Secondary | ICD-10-CM | POA: Diagnosis not present

## 2016-02-29 DIAGNOSIS — M7138 Other bursal cyst, other site: Secondary | ICD-10-CM | POA: Diagnosis not present

## 2016-02-29 DIAGNOSIS — M5136 Other intervertebral disc degeneration, lumbar region: Secondary | ICD-10-CM | POA: Diagnosis not present

## 2016-02-29 DIAGNOSIS — Z981 Arthrodesis status: Secondary | ICD-10-CM | POA: Diagnosis not present

## 2016-02-29 DIAGNOSIS — M1990 Unspecified osteoarthritis, unspecified site: Secondary | ICD-10-CM | POA: Diagnosis not present

## 2016-02-29 DIAGNOSIS — I10 Essential (primary) hypertension: Secondary | ICD-10-CM | POA: Diagnosis not present

## 2016-02-29 DIAGNOSIS — Z7982 Long term (current) use of aspirin: Secondary | ICD-10-CM | POA: Diagnosis not present

## 2016-03-01 DIAGNOSIS — M1990 Unspecified osteoarthritis, unspecified site: Secondary | ICD-10-CM | POA: Diagnosis present

## 2016-03-01 DIAGNOSIS — Z79899 Other long term (current) drug therapy: Secondary | ICD-10-CM | POA: Diagnosis not present

## 2016-03-01 DIAGNOSIS — M47896 Other spondylosis, lumbar region: Secondary | ICD-10-CM | POA: Diagnosis present

## 2016-03-01 DIAGNOSIS — Z981 Arthrodesis status: Secondary | ICD-10-CM | POA: Diagnosis not present

## 2016-03-01 DIAGNOSIS — G629 Polyneuropathy, unspecified: Secondary | ICD-10-CM | POA: Diagnosis present

## 2016-03-01 DIAGNOSIS — M7138 Other bursal cyst, other site: Secondary | ICD-10-CM | POA: Diagnosis present

## 2016-03-01 DIAGNOSIS — Z7982 Long term (current) use of aspirin: Secondary | ICD-10-CM | POA: Diagnosis not present

## 2016-03-11 ENCOUNTER — Inpatient Hospital Stay (HOSPITAL_COMMUNITY): Admission: RE | Admit: 2016-03-11 | Payer: Medicare Other | Source: Ambulatory Visit | Admitting: Specialist

## 2016-03-11 ENCOUNTER — Encounter (HOSPITAL_COMMUNITY): Admission: RE | Payer: Self-pay | Source: Ambulatory Visit

## 2016-03-11 SURGERY — ARTHROPLASTY, KNEE, TOTAL
Anesthesia: Spinal | Site: Knee | Laterality: Right

## 2016-03-24 DIAGNOSIS — I4892 Unspecified atrial flutter: Secondary | ICD-10-CM | POA: Diagnosis not present

## 2016-03-24 DIAGNOSIS — E669 Obesity, unspecified: Secondary | ICD-10-CM | POA: Diagnosis not present

## 2016-03-24 DIAGNOSIS — Z6837 Body mass index (BMI) 37.0-37.9, adult: Secondary | ICD-10-CM | POA: Diagnosis not present

## 2016-03-24 DIAGNOSIS — Z1389 Encounter for screening for other disorder: Secondary | ICD-10-CM | POA: Diagnosis not present

## 2016-03-24 DIAGNOSIS — Z0000 Encounter for general adult medical examination without abnormal findings: Secondary | ICD-10-CM | POA: Diagnosis not present

## 2016-03-24 DIAGNOSIS — M1990 Unspecified osteoarthritis, unspecified site: Secondary | ICD-10-CM | POA: Diagnosis not present

## 2016-03-24 DIAGNOSIS — Z125 Encounter for screening for malignant neoplasm of prostate: Secondary | ICD-10-CM | POA: Diagnosis not present

## 2016-03-24 DIAGNOSIS — E7800 Pure hypercholesterolemia, unspecified: Secondary | ICD-10-CM | POA: Diagnosis not present

## 2016-03-24 DIAGNOSIS — J309 Allergic rhinitis, unspecified: Secondary | ICD-10-CM | POA: Diagnosis not present

## 2016-03-24 DIAGNOSIS — R739 Hyperglycemia, unspecified: Secondary | ICD-10-CM | POA: Diagnosis not present

## 2016-03-24 DIAGNOSIS — Z23 Encounter for immunization: Secondary | ICD-10-CM | POA: Diagnosis not present

## 2016-03-24 DIAGNOSIS — I10 Essential (primary) hypertension: Secondary | ICD-10-CM | POA: Diagnosis not present

## 2016-04-14 ENCOUNTER — Other Ambulatory Visit: Payer: Self-pay | Admitting: Nurse Practitioner

## 2016-04-14 DIAGNOSIS — M7138 Other bursal cyst, other site: Secondary | ICD-10-CM

## 2016-04-14 DIAGNOSIS — M47896 Other spondylosis, lumbar region: Secondary | ICD-10-CM

## 2016-04-17 ENCOUNTER — Ambulatory Visit
Admission: RE | Admit: 2016-04-17 | Discharge: 2016-04-17 | Disposition: A | Discharge status: Home / Self Care | Payer: Medicare Other | Source: Ambulatory Visit | Attending: Nurse Practitioner | Admitting: Nurse Practitioner

## 2016-04-17 DIAGNOSIS — M7138 Other bursal cyst, other site: Secondary | ICD-10-CM

## 2016-04-17 DIAGNOSIS — M4806 Spinal stenosis, lumbar region: Secondary | ICD-10-CM | POA: Diagnosis not present

## 2016-04-17 DIAGNOSIS — M47896 Other spondylosis, lumbar region: Secondary | ICD-10-CM

## 2016-05-10 ENCOUNTER — Other Ambulatory Visit: Payer: Self-pay | Admitting: Internal Medicine

## 2016-05-19 DIAGNOSIS — D225 Melanocytic nevi of trunk: Secondary | ICD-10-CM | POA: Diagnosis not present

## 2016-05-19 DIAGNOSIS — B079 Viral wart, unspecified: Secondary | ICD-10-CM | POA: Diagnosis not present

## 2016-06-10 ENCOUNTER — Other Ambulatory Visit: Payer: Self-pay

## 2016-06-10 ENCOUNTER — Encounter (HOSPITAL_COMMUNITY): Payer: Self-pay | Admitting: Emergency Medicine

## 2016-06-10 ENCOUNTER — Emergency Department (HOSPITAL_COMMUNITY)
Admission: EM | Admit: 2016-06-10 | Discharge: 2016-06-10 | Disposition: A | Discharge status: Home / Self Care | Payer: Medicare Other | Attending: Emergency Medicine | Admitting: Emergency Medicine

## 2016-06-10 ENCOUNTER — Telehealth: Payer: Self-pay | Admitting: Internal Medicine

## 2016-06-10 DIAGNOSIS — I480 Paroxysmal atrial fibrillation: Secondary | ICD-10-CM

## 2016-06-10 DIAGNOSIS — R008 Other abnormalities of heart beat: Secondary | ICD-10-CM | POA: Insufficient documentation

## 2016-06-10 DIAGNOSIS — I498 Other specified cardiac arrhythmias: Secondary | ICD-10-CM

## 2016-06-10 DIAGNOSIS — I499 Cardiac arrhythmia, unspecified: Secondary | ICD-10-CM

## 2016-06-10 DIAGNOSIS — I10 Essential (primary) hypertension: Secondary | ICD-10-CM | POA: Insufficient documentation

## 2016-06-10 DIAGNOSIS — Z79899 Other long term (current) drug therapy: Secondary | ICD-10-CM | POA: Insufficient documentation

## 2016-06-10 DIAGNOSIS — R42 Dizziness and giddiness: Secondary | ICD-10-CM

## 2016-06-10 DIAGNOSIS — R001 Bradycardia, unspecified: Secondary | ICD-10-CM | POA: Diagnosis present

## 2016-06-10 DIAGNOSIS — Z7982 Long term (current) use of aspirin: Secondary | ICD-10-CM | POA: Insufficient documentation

## 2016-06-10 DIAGNOSIS — R5383 Other fatigue: Secondary | ICD-10-CM | POA: Insufficient documentation

## 2016-06-10 DIAGNOSIS — I493 Ventricular premature depolarization: Secondary | ICD-10-CM

## 2016-06-10 LAB — BASIC METABOLIC PANEL
Anion gap: 6 (ref 5–15)
BUN: 23 mg/dL — AB (ref 6–20)
CHLORIDE: 102 mmol/L (ref 101–111)
CO2: 29 mmol/L (ref 22–32)
CREATININE: 1.16 mg/dL (ref 0.61–1.24)
Calcium: 9.7 mg/dL (ref 8.9–10.3)
GFR calc Af Amer: >60 mL/min (ref >60)
GFR calc non Af Amer: >60 mL/min (ref >60)
GLUCOSE: 123 mg/dL — AB (ref 65–99)
POTASSIUM: 5 mmol/L (ref 3.5–5.1)
Sodium: 137 mmol/L (ref 135–145)

## 2016-06-10 LAB — URINALYSIS, ROUTINE W REFLEX MICROSCOPIC
Bilirubin Urine: NEGATIVE
GLUCOSE, UA: NEGATIVE mg/dL
Hgb urine dipstick: NEGATIVE
Ketones, ur: NEGATIVE mg/dL
LEUKOCYTES UA: NEGATIVE
Nitrite: NEGATIVE
PROTEIN: NEGATIVE mg/dL
SPECIFIC GRAVITY, URINE: 1.02 (ref 1.005–1.030)
pH: 5.5 (ref 5.0–8.0)

## 2016-06-10 LAB — CBC
HEMATOCRIT: 41.8 % (ref 39.0–52.0)
Hemoglobin: 13.9 g/dL (ref 13.0–17.0)
MCH: 32.3 pg (ref 26.0–34.0)
MCHC: 33.3 g/dL (ref 30.0–36.0)
MCV: 97 fL (ref 78.0–100.0)
PLATELETS: 182 10*3/uL (ref 150–400)
RBC: 4.31 MIL/uL (ref 4.22–5.81)
RDW: 13.6 % (ref 11.5–15.5)
WBC: 9.7 10*3/uL (ref 4.0–10.5)

## 2016-06-10 LAB — MAGNESIUM: MAGNESIUM: 2.2 mg/dL (ref 1.7–2.4)

## 2016-06-10 LAB — I-STAT TROPONIN, ED: Troponin i, poc: 0.01 ng/mL (ref 0.00–0.08)

## 2016-06-10 NOTE — ED Provider Notes (Signed)
CSN: [id number]     Arrival date & time 06/10/16  0130 History  By signing my name below, I, Dora Sims, attest that this documentation has been prepared under the direction and in the presence of physician practitioner, Merryl Hacker, MD. Electronically Signed: Dora Sims, Scribe. 06/10/2016. 4:01 AM.   Chief Complaint  Patient presents with  . Bradycardia  . Hypertension    The history is provided by the patient. No language interpreter was used.     HPI Comments: [name] is a 70 y.o. male with PMHx of HTN and paroxysmal atrial flutter who presents to the Emergency Department complaining of sudden onset, constant, bradycardia onset last night. Pt reports that he measured his heart rate at 32 PTA while checking his blood pressure. He states that he has been feeling fatigued and lightheaded for the last week but has not lost consciousness. Pt reports that his blood pressure has been slightly elevated for the last week as well. Pt states that he has metoprolol but has not been taking it on recommendation from his cardiologist, Dr. [name]. He denies CP, SOB, or any other associated symptoms.  Past Medical History  Diagnosis Date  . Paroxysmal atrial flutter ([place])   . Dyslipidemia   . HTN (hypertension)   . Diverticulosis of colon (without mention of hemorrhage) 2009    Colonoscopy  . Hx of colonic polyps 2009    Colonoscopy(Hyperplastic)  . Arthritis   . Diplopia 07/10/2013  . 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 Obesity   . Dysrhythmia     PAF- not frequently   Past Surgical History  Procedure Laterality Date  . Cervical fusion  1996, 1997    2 surgery- skull to C7  . Replacement total knee Left 2005    left  . Colon resection  2007  . Synovial cyst excision      lumbar spine  . Tonsillectomy    . Colonoscopy w/ polypectomy    . Hemorrhoid surgery N/A 01/13/2016    Procedure: HEMORRHOIDECTOMY AND REMOVAL OF ANAL SKIN TAG;  Surgeon: Coralie Keens, MD;  Location: [facility];  Service: General;  Laterality: N/A;   Family History  Problem Relation Age of Onset  . Coronary artery disease Mother   . Colon cancer Neg Hx   . Stomach cancer Neg Hx   . Coronary artery disease Father    Social History  Substance Use Topics  . Smoking status: Never Smoker   . Smokeless tobacco: Never Used  . Alcohol Use: No    Review of Systems  Constitutional: Positive for fatigue.  Respiratory: Negative for shortness of breath.   Cardiovascular: Negative for chest pain.       Positive for bradycardia.  Gastrointestinal: Negative for nausea.  Neurological: Positive for light-headedness. Negative for syncope.  All other systems reviewed and are negative.  Allergies  Niacin  Home Medications   Prior to Admission medications   Medication Sig Start Date End Date Taking? Authorizing Provider  amLODipine (NORVASC) 5 MG tablet TAKE 1 TABLET BY MOUTH TWICE DAILY 05/11/16   Fay Records, MD  aspirin EC 81 MG tablet Take 81 mg by mouth daily.    Historical Provider, MD  benazepril (LOTENSIN) 20 MG tablet TAKE 1 TABLET BY MOUTH TWICE DAILY 05/11/16   Fay Records, MD  celecoxib (CELEBREX) 200 MG capsule Take 200 mg by mouth daily as needed for mild pain.     Historical Provider, MD  glucosamine-chondroitin  500-400 MG tablet Take 1 tablet by mouth daily.    Historical Provider, MD  hydrochlorothiazide (,MICROZIDE/HYDRODIURIL,) 12.5 MG capsule Take 12.5 mg by mouth daily.      Historical Provider, MD  HYDROcodone-acetaminophen (NORCO/VICODIN) 5-325 MG per tablet Take 1 tablet by mouth every 6 (six) hours as needed for moderate pain.    Historical Provider, MD  lidocaine (XYLOCAINE) 5 % ointment Apply 1 application topically 3 (three) times daily as needed. For pain 01/13/16   Coralie Keens, MD  metoprolol tartrate (LOPRESSOR) 25 MG tablet Take 1 tablet (25 mg total) by mouth 2 (two) times daily. TAKE AS NEEDED FOR ATRIAL FLUTTER Patient taking  differently: Take 25 mg by mouth as needed (atrial flutter). TAKE AS NEEDED FOR ATRIAL FLUTTER 09/10/15   Fay Records, MD  Multiple Vitamin (MULTIVITAMIN) tablet Take 1 tablet by mouth daily.    Historical Provider, MD  Omega-3 Fatty Acids (FISH OIL) 1000 MG CAPS Take 1,000 mg by mouth daily.    Historical Provider, MD  oxyCODONE-acetaminophen (PERCOCET) 10-325 MG per tablet Take 1 tablet by mouth every 6 (six) hours as needed for pain.     Historical Provider, MD  predniSONE (STERAPRED UNI-PAK 21 TAB) 10 MG (21) TBPK tablet Take 10 mg by mouth daily.    Historical Provider, MD  vitamin B-12 (CYANOCOBALAMIN) 500 MCG tablet Take 500 mcg by mouth daily.    Historical Provider, MD  vitamin C (ASCORBIC ACID) 500 MG tablet Take 500 mg by mouth daily.    Historical Provider, MD   BP 132/65 mmHg  Pulse 42  Temp(Src) 98.1 F (36.7 C) (Oral)  Resp 11  SpO2 96% Physical Exam  Constitutional: He is oriented to person, place, and time. He appears well-developed and well-nourished.  Elderly, no acute distress  HENT:  Head: Normocephalic and atraumatic.  Cardiovascular: Normal rate, regular rhythm and normal heart sounds.   No murmur heard. Pulse rate of approximately half ventricular rate with palpation of radial pulse  Pulmonary/Chest: Effort normal and breath sounds normal. No respiratory distress. He has no wheezes.  Abdominal: Soft. Bowel sounds are normal. There is no tenderness. There is no rebound.  Musculoskeletal: He exhibits no edema.  Neurological: He is alert and oriented to person, place, and time.  5 out of 5 strength in all 4 extremities, normal gait  Skin: Skin is warm and dry.  Psychiatric: He has a normal mood and affect.  Nursing note and vitals reviewed.   ED Course  Procedures (including critical care time)  DIAGNOSTIC STUDIES: Oxygen Saturation is 98% on RA, normal by my interpretation.    COORDINATION OF CARE: 4:01 AM Discussed treatment plan with pt at bedside and pt  agreed to plan.  Labs Review Labs Reviewed  BASIC METABOLIC PANEL - Abnormal; Notable for the following:    Glucose, Bld 123 (*)    BUN 23 (*)    All other components within normal limits  CBC  MAGNESIUM  URINALYSIS, ROUTINE W REFLEX MICROSCOPIC (NOT AT [facility])  I-STAT TROPOININ, ED    Imaging Review No results found. I have personally reviewed and evaluated these images and lab results as part of my medical decision-making.   EKG Interpretation   Date/Time:  Friday June 10 2016 01:33:25 EDT Ventricular Rate:  77 PR Interval:  170 QRS Duration: 90 QT Interval:  366 QTC Calculation: 414 R Axis:   42 Text Interpretation:  Normal sinus rhythm Nonspecific ST abnormality  Abnormal ECG Confirmed by Quinn Quam  MD,  [place]ven[place] ([id number]) on 06/10/2016  4:14:29 AM      EKG Interpretation  Date/Time:  Friday June 10 2016 04:20:47 EDT Ventricular Rate:  80 PR Interval:  170 QRS Duration: 101 QT Interval:  433 QTC Calculation: 362 R Axis:   60 Text Interpretation:  Sinus rhythm Ventricular bigeminy Confirmed by Madysin Crisp  MD, [place] ([id number]) on 06/10/2016 4:29:43 AM        MDM   Final diagnoses:  Bigeminy  Other fatigue    Patient presents with generalized fatigue. Noted bradycardia and hypertension at home. On evaluation here his initial blood pressure is 160 over 80s. I noted on the monitor for him to be in bigeminy. Pulse rate approximately half the EKG rate on the monitor. He does not appear to be perfusing his PVCs. He did go in and out of bigeminy and stated when he was in bigeminy he did not feel good. His troponin and otherwise his workup is reassuring. Potassium and magnesium are normal. Discussed with cardiology fellow on call Dr. Posey Pronto.  He recommends close follow-up with Dr. Harrington Challenger. Patient may need Holter monitoring to evaluate for how frequently he is in bigeminy. Patient is otherwise nontoxic. Discussed the plan with the patient and his wife.  After history, exam, and  medical workup I feel the patient has been appropriately medically screened and is safe for discharge home. Pertinent diagnoses were discussed with the patient. Patient was given return precautions.  I personally performed the services described in this documentation, which was scribed in my presence. The recorded information has been reviewed and is accurate.   Merryl Hacker, MD 06/10/16 [phone]

## 2016-06-10 NOTE — Discharge Instructions (Signed)
You were seen today and found to be in bigeminy. Otherwise her workup is reassuring. You need to follow-up with Dr. Harrington Challenger and may need a Holter monitor for further evaluation.   Premature Ventricular Contraction A premature ventricular contraction is an irregularity in the normal heart rhythm. These contractions are extra heartbeats that occur too early in the normal sequence. In most cases, these contractions are harmless and do not require treatment. CAUSES Premature ventricular contractions may occur without a known cause. In healthy people, the extra contractions may be caused by:  Smoking.  Drinking alcohol.  Caffeine.  Certain medicines.  Some illegal drugs.  Stress. Sometimes, changes in chemicals in the blood (electrolytes) can also cause premature ventricular contractions. They can also occur in people with heart diseases that cause a decrease in blood flow to the heart. SIGNS AND SYMPTOMS Premature ventricular contractions often do not cause any symptoms. In some cases, you may have a feeling of your heart beating fast or skipping a beat (palpitations). DIAGNOSIS Your health care provider will take your medical history and do a physical exam. During the exam, the health care provider will check for irregular heartbeats. Various tests may be done to help diagnose premature ventricular contractions. These tests may include:  An ECG (electrocardiogram) to monitor the electrical activity of your heart.  Holter monitor testing. A Holter monitor is a portable device that can monitor the electrical activity of your heart over longer periods of time.  Stress tests to see how exercise affects your heart rhythm.  Echocardiogram. This test uses sound waves (ultrasound) to produce an image of your heart.  Electrophysiology study. This is used to evaluate the electrical conduction system of your heart. TREATMENT Usually, no treatment is needed. You may be advised to avoid things that  can trigger the premature contractions, such as caffeine or alcohol. Medicines are sometimes given if symptoms are severe or if the extra heartbeats are very frequent. Treatment may also be needed for an underlying cause of the contractions if one is found. HOME CARE INSTRUCTIONS  Take medicines only as directed by your health care provider.  Make any lifestyle changes recommended by your health care provider. These may include:  Quitting smoking.  Avoiding or limiting caffeine or alcohol.  Exercising. Talk to your health care provider about what type of exercise is safe for you.  Trying to reduce stress.  Keep all follow-up visits with your health care provider. This is important. SEEK IMMEDIATE MEDICAL CARE IF:  You feel palpitations that are frequent or continual.  You have chest pain.  You have shortness of breath.  You have sweating for no reason.  You have nausea and vomiting.  You become light-headed or faint.   This information is not intended to replace advice given to you by your health care provider. Make sure you discuss any questions you have with your health care provider.   Document Released: 07/22/2004 Document Revised: 12/26/2014 Document Reviewed: 05/08/2014 Elsevier Interactive Patient Education [employer].

## 2016-06-10 NOTE — ED Notes (Signed)
Pt. reports bradycardia this evening = 35/min " it is slowing down " with elevated blood pressure , fatigue and lightheaded  , denies chest pain , no SOB , denies nausea or diaphoresis .

## 2016-06-10 NOTE — Telephone Encounter (Signed)
Pt went to ER last night for low heartrate-dizzy and lightheaded-

## 2016-06-10 NOTE — Telephone Encounter (Signed)
Dr Harrington Challenger reviewed ED visit and would like patient to have Echo and 24 hour holter monitor  Advised patient and scheduled

## 2016-06-13 ENCOUNTER — Ambulatory Visit (INDEPENDENT_AMBULATORY_CARE_PROVIDER_SITE_OTHER): Payer: Medicare Other

## 2016-06-13 DIAGNOSIS — I493 Ventricular premature depolarization: Secondary | ICD-10-CM | POA: Diagnosis not present

## 2016-06-15 ENCOUNTER — Ambulatory Visit (HOSPITAL_COMMUNITY): Payer: Medicare Other | Attending: Cardiology

## 2016-06-15 ENCOUNTER — Other Ambulatory Visit: Payer: Self-pay

## 2016-06-15 DIAGNOSIS — R42 Dizziness and giddiness: Secondary | ICD-10-CM

## 2016-06-15 DIAGNOSIS — I480 Paroxysmal atrial fibrillation: Secondary | ICD-10-CM

## 2016-06-15 DIAGNOSIS — I493 Ventricular premature depolarization: Secondary | ICD-10-CM

## 2016-06-15 DIAGNOSIS — R002 Palpitations: Secondary | ICD-10-CM | POA: Diagnosis present

## 2016-06-15 DIAGNOSIS — I340 Nonrheumatic mitral (valve) insufficiency: Secondary | ICD-10-CM | POA: Diagnosis not present

## 2016-06-15 LAB — ECHOCARDIOGRAM COMPLETE
CHL CUP DOP CALC LVOT VTI: 19.8 cm
EERAT: 7.13
EWDT: 271 ms
FS: 36 % (ref 28–44)
IV/PV OW: 1.04
LA diam end sys: 45 mm
LA vol: 62 mL
LADIAMINDEX: 1.91 cm/m2
LASIZE: 45 mm
LAVOLA4C: 59 mL
LAVOLIN: 26.3 mL/m2
LV TDI E'MEDIAL: 9.76
LV e' LATERAL: 14.3 cm/s
LVEEAVG: 7.13
LVEEMED: 7.13
LVOT SV: 75 mL
LVOT area: 3.8 cm2
LVOT diameter: 22 mm
LVOT peak vel: 80.5 cm/s
MV Dec: 271
MV Peak grad: 4 mmHg
MV pk A vel: 89.8 m/s
MVPKEVEL: 102 m/s
PW: 10.4 mm — AB (ref 0.6–1.1)
Reg peak vel: 270 cm/s
TDI e' lateral: 14.3
TRMAXVEL: 270 cm/s

## 2016-06-17 ENCOUNTER — Telehealth: Payer: Self-pay | Admitting: *Deleted

## 2016-06-17 NOTE — Telephone Encounter (Signed)
Pt notified of echo results by phone with verbal understanding. Pt aware monitor results still pending; however once read we will call him back with monitor results as well. Pt said thank you.

## 2016-08-18 ENCOUNTER — Encounter: Payer: Self-pay | Admitting: *Deleted

## 2016-09-04 NOTE — Progress Notes (Signed)
Cardiology Office Note   Date:  09/05/2016   ID:  [name], DOB Sep 29, 1946, MRN [id number]  PCP:  [name], MD  Cardiologist:   [name], MD   F/U of Paroxysmal atrial flutter and HTN     History of Present Illness: [name] is a 70 y.o. male with a history of paroxy atrial flutter, HTN and HL  I saw him in Sept 2016 Went to ER in June  BP machine at home reported HR was slow  32 at check of BP   At home  IN ER  BP 160  HR in 70s (bigimy) Recomm f/u in cardiology   Echo showed normal LVEF   Holter monitor showed 8.7% PVCs  Freq PACs    When I called pt he said was feeling better  Blamed on GI bug  Outpatient Medications Prior to Visit  Medication Sig Dispense Refill  . amLODipine (NORVASC) 5 MG tablet TAKE 1 TABLET BY MOUTH TWICE DAILY 60 tablet 3  . aspirin EC 81 MG tablet Take 81 mg by mouth daily.    . benazepril (LOTENSIN) 20 MG tablet TAKE 1 TABLET BY MOUTH TWICE DAILY 60 tablet 3  . celecoxib (CELEBREX) 200 MG capsule Take 200 mg by mouth daily as needed for mild pain.     Marland Kitchen glucosamine-chondroitin 500-400 MG tablet Take 1 tablet by mouth daily.    . hydrochlorothiazide (,MICROZIDE/HYDRODIURIL,) 12.5 MG capsule Take 12.5 mg by mouth daily.      Marland Kitchen HYDROcodone-acetaminophen (NORCO/VICODIN) 5-325 MG per tablet Take 1 tablet by mouth every 6 (six) hours as needed for moderate pain.    Marland Kitchen lidocaine (XYLOCAINE) 5 % ointment Apply 1 application topically 3 (three) times daily as needed. For pain 35.44 g 1  . metoprolol tartrate (LOPRESSOR) 25 MG tablet Take 1 tablet (25 mg total) by mouth 2 (two) times daily. TAKE AS NEEDED FOR ATRIAL FLUTTER (Patient taking differently: Take 25 mg by mouth as needed (atrial flutter). TAKE AS NEEDED FOR ATRIAL FLUTTER) 60 tablet 0  . Multiple Vitamin (MULTIVITAMIN) tablet Take 1 tablet by mouth daily.    . Omega-3 Fatty Acids (FISH OIL) 1000 MG CAPS Take 1,000 mg by mouth daily.    Marland Kitchen oxyCODONE-acetaminophen (PERCOCET) 10-325 MG  per tablet Take 1 tablet by mouth every 6 (six) hours as needed for pain.     . vitamin B-12 (CYANOCOBALAMIN) 500 MCG tablet Take 500 mcg by mouth daily.    . vitamin C (ASCORBIC ACID) 500 MG tablet Take 500 mg by mouth daily.    . predniSONE (STERAPRED UNI-PAK 21 TAB) 10 MG (21) TBPK tablet Take 10 mg by mouth daily.     No facility-administered medications prior to visit.      Allergies:   Niacin   Past Medical History:  Diagnosis Date  . Arthritis   . Diplopia 07/10/2013  . Diverticulosis of colon (without mention of hemorrhage) 2009   Colonoscopy  . Dyslipidemia   . Dysrhythmia    PAF- not frequently  . HTN (hypertension)   . Hx of colonic polyps 2009   Colonoscopy(Hyperplastic)  . Obesity   . 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 Paroxysmal atrial flutter [facility])     Past Surgical History:  Procedure Laterality Date  . [place]   2 surgery- skull to C7  . COLON RESECTION  2007  . COLONOSCOPY W/ POLYPECTOMY    . HEMORRHOID SURGERY N/A 01/13/2016   Procedure: HEMORRHOIDECTOMY  AND REMOVAL OF ANAL SKIN TAG;  Surgeon: Coralie Keens, MD;  Location: [place];  Service: General;  Laterality: N/A;  . REPLACEMENT TOTAL KNEE Left 2005   left  . SYNOVIAL CYST EXCISION     lumbar spine  . TONSILLECTOMY       Social History:  The patient  reports that he has never smoked. He has never used smokeless tobacco. He reports that he does not drink alcohol or use drugs.   Family History:  The patient's family history includes Coronary artery disease in his father and mother.    ROS:  Please see the history of present illness. All other systems are reviewed and  Negative to the above problem except as noted.    PHYSICAL EXAM: VS:  BP 140/62   Pulse (!) 56   Ht 5\' 11"  (1.803 m)   Wt 250 lb 1.9 oz (113.5 kg)   BMI 34.88 kg/m   GEN: Well nourished, well developed, in no acute distress  HEENT: normal  Neck: no JVD, carotid bruits, or masses Cardiac: RRR  with occasional skip  ; no murmurs, rubs, or gallops,no edema  Respiratory:  clear to auscultation bilaterally, normal work of breathing GI: soft, nontender, nondistended, + BS  No hepatomegaly  MS: no deformity Moving all extremities   Skin: warm and dry, no rash Neuro:  Strength and sensation are intact Psych: euthymic mood, full affect   EKG:  EKG is not  ordered today.   Lipid Panel    Component Value Date/Time   CHOL 135 09/10/2015 1014   TRIG 91.0 09/10/2015 1014   HDL 31.30 (L) 09/10/2015 1014   CHOLHDL 4 09/10/2015 1014   VLDL 18.2 09/10/2015 1014   LDLCALC 86 09/10/2015 1014      Wt Readings from Last 3 Encounters:  09/05/16 250 lb 1.9 oz (113.5 kg)  01/13/16 260 lb (117.9 kg)  09/10/15 251 lb 6.4 oz (114 kg)      ASSESSMENT AND PLAN:  1  paoxysmal atrial flutter  When the pt had once in past he was very symptomatic  Feels in neck (after all surgeries)  Would follow  2  Bradycardia  Fals reading  Due t oPVCs  Follow  He is not symptomatic  3  HTN  Adquate control  Will check CBC, BMET, A1C, PSA and TSH and lipoids     Current medicines are reviewed at length with the patient today.  The patient does not have concerns regarding medicines.  Disposition:   FU with  in   Signed, [name], MD  09/05/2016 10:00 AM    [facility], [place],   [address] Phone: [phone]; Fax: [phone]

## 2016-09-05 ENCOUNTER — Encounter: Payer: Self-pay | Admitting: Internal Medicine

## 2016-09-05 ENCOUNTER — Ambulatory Visit (INDEPENDENT_AMBULATORY_CARE_PROVIDER_SITE_OTHER): Payer: Medicare Other | Admitting: Internal Medicine

## 2016-09-05 VITALS — BP 140/62 | HR 56 | Ht 71.0 in | Wt 250.1 lb

## 2016-09-05 DIAGNOSIS — IMO0001 Reserved for inherently not codable concepts without codable children: Secondary | ICD-10-CM

## 2016-09-05 DIAGNOSIS — R7309 Other abnormal glucose: Secondary | ICD-10-CM | POA: Diagnosis not present

## 2016-09-05 DIAGNOSIS — Z87448 Personal history of other diseases of urinary system: Secondary | ICD-10-CM | POA: Diagnosis not present

## 2016-09-05 DIAGNOSIS — Z87898 Personal history of other specified conditions: Secondary | ICD-10-CM

## 2016-09-05 DIAGNOSIS — E785 Hyperlipidemia, unspecified: Secondary | ICD-10-CM | POA: Diagnosis not present

## 2016-09-05 DIAGNOSIS — I4892 Unspecified atrial flutter: Secondary | ICD-10-CM

## 2016-09-05 DIAGNOSIS — I10 Essential (primary) hypertension: Secondary | ICD-10-CM

## 2016-09-05 DIAGNOSIS — Z125 Encounter for screening for malignant neoplasm of prostate: Secondary | ICD-10-CM | POA: Diagnosis not present

## 2016-09-05 DIAGNOSIS — Z0000 Encounter for general adult medical examination without abnormal findings: Secondary | ICD-10-CM | POA: Diagnosis not present

## 2016-09-05 DIAGNOSIS — Z139 Encounter for screening, unspecified: Secondary | ICD-10-CM | POA: Diagnosis not present

## 2016-09-05 LAB — CBC
HEMATOCRIT: 41.1 % (ref 38.5–50.0)
HEMOGLOBIN: 14.2 g/dL (ref 13.2–17.1)
MCH: 33.2 pg — ABNORMAL HIGH (ref 27.0–33.0)
MCHC: 34.5 g/dL (ref 32.0–36.0)
MCV: 96 fL (ref 80.0–100.0)
MPV: 11.9 fL (ref 7.5–12.5)
Platelets: 220 10*3/uL (ref 140–400)
RBC: 4.28 MIL/uL (ref 4.20–5.80)
RDW: 13.4 % (ref 11.0–15.0)
WBC: 7.4 10*3/uL (ref 3.8–10.8)

## 2016-09-05 LAB — BASIC METABOLIC PANEL
BUN: 22 mg/dL (ref 7–25)
CALCIUM: 10 mg/dL (ref 8.6–10.3)
CHLORIDE: 101 mmol/L (ref 98–110)
CO2: 25 mmol/L (ref 20–31)
CREATININE: 0.98 mg/dL (ref 0.70–1.18)
Glucose, Bld: 114 mg/dL — ABNORMAL HIGH (ref 65–99)
Potassium: 4.3 mmol/L (ref 3.5–5.3)
SODIUM: 138 mmol/L (ref 135–146)

## 2016-09-05 LAB — LIPID PANEL
CHOL/HDL RATIO: 4.4 ratio (ref <=5.0)
Cholesterol: 158 mg/dL (ref 125–200)
HDL: 36 mg/dL — AB (ref >=40)
LDL CALC: 100 mg/dL (ref <130)
TRIGLYCERIDES: 110 mg/dL (ref <150)
VLDL: 22 mg/dL (ref <30)

## 2016-09-05 LAB — PSA: PSA: 0.5 ng/mL (ref <=4.0)

## 2016-09-05 LAB — TSH: TSH: 2 m[IU]/L (ref 0.40–4.50)

## 2016-09-05 NOTE — Patient Instructions (Signed)
Your physician recommends that you continue on your current medications as directed. Please refer to the Current Medication list given to you today. Your physician recommends that you return for lab work today (bmet, cbc, lipids, a1c, psa, tsh) Your physician wants you to follow-up in: 1 year with Dr. Harrington Challenger.  You will receive a reminder letter in the mail two months in advance. If you don't receive a letter, please call our office to schedule the follow-up appointment.

## 2016-09-06 LAB — HEMOGLOBIN A1C
Hgb A1c MFr Bld: 5.3 % (ref <5.7)
Mean Plasma Glucose: 105 mg/dL

## 2016-09-12 ENCOUNTER — Other Ambulatory Visit: Payer: Self-pay | Admitting: Internal Medicine

## 2016-09-19 DIAGNOSIS — L570 Actinic keratosis: Secondary | ICD-10-CM | POA: Diagnosis not present

## 2016-09-19 DIAGNOSIS — D485 Neoplasm of uncertain behavior of skin: Secondary | ICD-10-CM | POA: Diagnosis not present

## 2016-09-19 DIAGNOSIS — D034 Melanoma in situ of scalp and neck: Secondary | ICD-10-CM | POA: Diagnosis not present

## 2016-09-23 DIAGNOSIS — E782 Mixed hyperlipidemia: Secondary | ICD-10-CM | POA: Diagnosis not present

## 2016-09-23 DIAGNOSIS — N529 Male erectile dysfunction, unspecified: Secondary | ICD-10-CM | POA: Diagnosis not present

## 2016-09-23 DIAGNOSIS — I4892 Unspecified atrial flutter: Secondary | ICD-10-CM | POA: Diagnosis not present

## 2016-09-23 DIAGNOSIS — E669 Obesity, unspecified: Secondary | ICD-10-CM | POA: Diagnosis not present

## 2016-09-23 DIAGNOSIS — R7303 Prediabetes: Secondary | ICD-10-CM | POA: Diagnosis not present

## 2016-09-23 DIAGNOSIS — Z6836 Body mass index (BMI) 36.0-36.9, adult: Secondary | ICD-10-CM | POA: Diagnosis not present

## 2016-09-23 DIAGNOSIS — I10 Essential (primary) hypertension: Secondary | ICD-10-CM | POA: Diagnosis not present

## 2016-09-23 DIAGNOSIS — Z23 Encounter for immunization: Secondary | ICD-10-CM | POA: Diagnosis not present

## 2016-09-28 DIAGNOSIS — D034 Melanoma in situ of scalp and neck: Secondary | ICD-10-CM | POA: Diagnosis not present

## 2016-09-28 DIAGNOSIS — C4442 Squamous cell carcinoma of skin of scalp and neck: Secondary | ICD-10-CM | POA: Diagnosis not present

## 2016-10-31 DIAGNOSIS — M1711 Unilateral primary osteoarthritis, right knee: Secondary | ICD-10-CM | POA: Diagnosis not present

## 2016-11-03 ENCOUNTER — Telehealth: Payer: Self-pay | Admitting: Internal Medicine

## 2016-11-03 NOTE — Telephone Encounter (Signed)
Walk In Pt Form-[facility] Clearance dropped off gave to Michalene/Ross

## 2016-11-04 DIAGNOSIS — T180XXA Foreign body in mouth, initial encounter: Secondary | ICD-10-CM | POA: Diagnosis not present

## 2016-11-09 DIAGNOSIS — T180XXA Foreign body in mouth, initial encounter: Secondary | ICD-10-CM | POA: Diagnosis not present

## 2016-11-18 DIAGNOSIS — J301 Allergic rhinitis due to pollen: Secondary | ICD-10-CM | POA: Diagnosis not present

## 2016-12-05 DIAGNOSIS — J209 Acute bronchitis, unspecified: Secondary | ICD-10-CM | POA: Diagnosis not present

## 2016-12-08 NOTE — Progress Notes (Signed)
Pt is being scheduled for preop appt; please place surgical orders in epic. Thanks.  

## 2016-12-14 ENCOUNTER — Ambulatory Visit: Payer: Self-pay | Admitting: Orthopedic Surgery

## 2016-12-21 NOTE — H&P (Signed)
TOTAL KNEE ADMISSION H&P  Patient is being admitted for right total knee arthroplasty.  Subjective:  Chief Complaint:right knee pain.  HPI: [name], 71 y.o. male, has a history of pain and functional disability in the right knee due to arthritis and has failed non-surgical conservative treatments for greater than 12 weeks to includeNSAID's and/or analgesics, viscosupplementation injections, flexibility and strengthening excercises, weight reduction as appropriate and activity modification.  Onset of symptoms was gradual, starting 3 years ago with gradually worsening course since that time. The patient noted no past surgery on the right knee(s).  Patient currently rates pain in the right knee(s) at 8 out of 10 with activity. Patient has pain that interferes with activities of daily living, pain with passive range of motion and joint swelling.  Patient has evidence of subchondral sclerosis, periarticular osteophytes and joint space narrowing by imaging studies. There is no active infection.  Patient Active Problem List   Diagnosis Date Noted  . Diplopia 07/10/2013  . 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 Sore throat 07/26/2011  . DIZZINESS 09/10/2009  . ATRIAL FLUTTER, PAROXYSMAL 07/09/2009  . Dyslipidemia 07/07/2009  . Essential hypertension 07/07/2009   Past Medical History:  Diagnosis Date  . Arthritis   . Diplopia 07/10/2013  . Diverticulosis of colon (without mention of hemorrhage) 2009   Colonoscopy  . Dyslipidemia   . Dysrhythmia    PAF- not frequently  . HTN (hypertension)   . Hx of colonic polyps 2009   Colonoscopy(Hyperplastic)  . Obesity   . 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 Paroxysmal atrial flutter [facility])     Past Surgical History:  Procedure Laterality Date  . [place]   2 surgery- skull to C7  . COLON RESECTION  2007  . COLONOSCOPY W/ POLYPECTOMY    . HEMORRHOID SURGERY N/A 01/13/2016   Procedure: HEMORRHOIDECTOMY AND  REMOVAL OF ANAL SKIN TAG;  Surgeon: Coralie Keens, MD;  Location: [place];  Service: General;  Laterality: N/A;  . REPLACEMENT TOTAL KNEE Left 2005   left  . SYNOVIAL CYST EXCISION     lumbar spine  . TONSILLECTOMY      No prescriptions prior to admission.   Allergies  Allergen Reactions  . Niacin Diarrhea    REACTION: causes diarreha    Social History  Substance Use Topics  . Smoking status: Never Smoker  . Smokeless tobacco: Never Used  . Alcohol use No    Family History  Problem Relation Age of Onset  . Coronary artery disease Mother   . Coronary artery disease Father   . Colon cancer Neg Hx   . Stomach cancer Neg Hx      Review of Systems  Constitutional: Negative.   HENT: Negative.   Eyes: Negative.   Respiratory: Negative.   Cardiovascular: Negative.   Gastrointestinal: Negative.   Genitourinary: Negative.   Musculoskeletal: Positive for joint pain.  Skin: Negative.   Neurological: Negative.   Endo/Heme/Allergies: Negative.   Psychiatric/Behavioral: Negative.     Objective:  Physical Exam  Constitutional: He is oriented to person, place, and time. He appears well-developed.  HENT:  Head: Normocephalic.  Eyes: EOM are normal.  Neck: Normal range of motion.  Cardiovascular: Normal heart sounds and intact distal pulses.   Respiratory: Effort normal.  GI: Soft.  Genitourinary:  Genitourinary Comments: Deferred  Musculoskeletal:  Pain with ROM of knee. Right knee is stable. RLE grossly n/v intact.  Neurological: He is alert and oriented to person,  place, and time. He has normal reflexes.  Skin: Skin is warm and dry.  Psychiatric: His behavior is normal.    Vital signs in last 24 hours: BP: ()/()  Arterial Line BP: ()/()   Labs:   Estimated body mass index is 34.88 kg/m as calculated from the following:   Height as of 09/05/16: 5\' 11"  (1.803 m).   Weight as of 09/05/16: 113.5 kg (250 lb 1.9 oz).   Imaging Review Plain radiographs demonstrate  severe degenerative joint disease of the right knee(s). The overall alignment ismild varus. The bone quality appears to be good for age and reported activity level.  Assessment/Plan:  End stage arthritis, right knee   The patient history, physical examination, clinical judgment of the provider and imaging studies are consistent with end stage degenerative joint disease of the right knee(s) and total knee arthroplasty is deemed medically necessary. The treatment options including medical management, injection therapy arthroscopy and arthroplasty were discussed at length. The risks and benefits of total knee arthroplasty were presented and reviewed. The risks due to aseptic loosening, infection, stiffness, patella tracking problems, thromboembolic complications and other imponderables were discussed. The patient acknowledged the explanation, agreed to proceed with the plan and consent was signed. Patient is being admitted for inpatient treatment for surgery, pain control, PT, OT, prophylactic antibiotics, VTE prophylaxis, progressive ambulation and ADL's and discharge planning. The patient is planning to be discharged home with home health services.  Will use IV tranexamic acid. Contraindications and adverse affects of Tranexamic acid discussed in detail. Patient denies any of these at this time and understands the risks and benefits.

## 2016-12-22 ENCOUNTER — Telehealth: Payer: Self-pay | Admitting: *Deleted

## 2016-12-22 NOTE — Telephone Encounter (Signed)
OK to hold aspirin prior to surgery  Resume when safe after

## 2016-12-22 NOTE — Telephone Encounter (Signed)
Received fax from [facility]   Pt is scheduled for right TK on 01/06/17.  They already have cardiac clearance from Dr. Harrington Challenger but need instructions given re: aspirin.  Routing to Dr. Harrington Challenger for recommendations.

## 2016-12-23 NOTE — Telephone Encounter (Signed)
Faxed (via EPIC) this documentation to [facility] [phone]

## 2016-12-28 ENCOUNTER — Other Ambulatory Visit (HOSPITAL_COMMUNITY): Payer: Medicare Other

## 2016-12-29 NOTE — Patient Instructions (Addendum)
[name]  12/29/2016   Your procedure is scheduled on: Friday 01/06/2017  Report to [facility]  Entrance take [name]  elevators to 3rd floor to  [name] at  [name] AM.  Call this number if you have problems the morning of surgery [phone]   Remember: ONLY 1 PERSON MAY GO WITH YOU TO SHORT STAY TO GET  READY MORNING OF [name].    Do not eat food or drink liquids :After Midnight.     Take these medicines the morning of surgery with A SIP OF WATER: Amlodipine. Metoprolol if needed                                You may not have any metal on your body including hair pins and              piercings  Do not wear jewelry, make-up, lotions, powders or perfumes, deodorant             Do not wear nail polish.  Do not shave  48 hours prior to surgery.              Men may shave face and neck.   Do not bring valuables to the hospital. [name].  Contacts, dentures or bridgework may not be worn into surgery.  Leave suitcase in the car. After surgery it may be brought to your room.                  Please read over the following fact sheets you were given: _____________________________________________________________________             [name] - Preparing for Surgery Before surgery, you can play an important role.  Because skin is not sterile, your skin needs to be as free of germs as possible.  You can reduce the number of germs on your skin by washing with CHG (chlorahexidine gluconate) soap before surgery.  CHG is an antiseptic cleaner which kills germs and bonds with the skin to continue killing germs even after washing. Please DO NOT use if you have an allergy to CHG or antibacterial soaps.  If your skin becomes reddened/irritated stop using the CHG and inform your nurse when you arrive at Short Stay. Do not shave (including legs and underarms) for at least 48 hours prior to the  first CHG shower.  You may shave your face/neck. Please follow these instructions carefully:  1.  Shower with CHG Soap the night before surgery and the  morning of Surgery.  2.  If you choose to wash your hair, wash your hair first as usual with your  normal  shampoo.  3.  After you shampoo, rinse your hair and body thoroughly to remove the  shampoo.                           4.  Use CHG as you would any other liquid soap.  You can apply chg directly  to the skin and wash                       Gently with a scrungie or clean washcloth.  5.  Apply the CHG Soap to your body ONLY FROM THE NECK DOWN.   Do not use on face/ open                           Wound or open sores. Avoid contact with eyes, ears mouth and genitals (private parts).                       Wash face,  Genitals (private parts) with your normal soap.             6.  Wash thoroughly, paying special attention to the area where your surgery  will be performed.  7.  Thoroughly rinse your body with warm water from the neck down.  8.  DO NOT shower/wash with your normal soap after using and rinsing off  the CHG Soap.                9.  Pat yourself dry with a clean towel.            10.  Wear clean pajamas.            11.  Place clean sheets on your bed the night of your first shower and do not  sleep with pets. Day of Surgery : Do not apply any lotions/deodorants the morning of surgery.  Please wear clean clothes to the hospital/surgery [name].  FAILURE TO FOLLOW THESE INSTRUCTIONS MAY RESULT IN THE CANCELLATION OF YOUR SURGERY PATIENT SIGNATURE_________________________________  NURSE SIGNATURE__________________________________  ________________________________________________________________________   [name]  An incentive spirometer is a tool that can help keep your lungs clear and active. This tool measures how well you are filling your lungs with each breath. Taking long deep breaths may help reverse or decrease  the chance of developing breathing (pulmonary) problems (especially infection) following:  A long period of time when you are unable to move or be active. BEFORE THE PROCEDURE   If the spirometer includes an indicator to show your best effort, your nurse or respiratory therapist will set it to a desired goal.  If possible, sit up straight or lean slightly forward. Try not to slouch.  Hold the incentive spirometer in an upright position. INSTRUCTIONS FOR USE  1. Sit on the edge of your bed if possible, or sit up as far as you can in bed or on a chair. 2. Hold the incentive spirometer in an upright position. 3. Breathe out normally. 4. Place the mouthpiece in your mouth and seal your lips tightly around it. 5. Breathe in slowly and as deeply as possible, raising the piston or the ball toward the top of the column. 6. Hold your breath for 3-5 seconds or for as long as possible. Allow the piston or ball to fall to the bottom of the column. 7. Remove the mouthpiece from your mouth and breathe out normally. 8. Rest for a few seconds and repeat Steps 1 through 7 at least 10 times every 1-2 hours when you are awake. Take your time and take a few normal breaths between deep breaths. 9. The spirometer may include an indicator to show your best effort. Use the indicator as a goal to work toward during each repetition. 10. After each set of 10 deep breaths, practice coughing to be sure your lungs are clear. If you have an incision (the cut made at the time of surgery), support your incision when coughing by placing a  pillow or rolled up towels firmly against it. Once you are able to get out of bed, walk around indoors and cough well. You may stop using the incentive spirometer when instructed by your caregiver.  RISKS AND COMPLICATIONS  Take your time so you do not get dizzy or light-headed.  If you are in pain, you may need to take or ask for pain medication before doing incentive spirometry. It is  harder to take a deep breath if you are having pain. AFTER USE  Rest and breathe slowly and easily.  It can be helpful to keep track of a log of your progress. Your caregiver can provide you with a simple table to help with this. If you are using the spirometer at home, follow these instructions: [place] IF:   You are having difficultly using the spirometer.  You have trouble using the spirometer as often as instructed.  Your pain medication is not giving enough relief while using the spirometer.  You develop fever of 100.5 F (38.1 C) or higher. SEEK IMMEDIATE MEDICAL CARE IF:   You cough up bloody sputum that had not been present before.  You develop fever of 102 F (38.9 C) or greater.  You develop worsening pain at or near the incision site. MAKE SURE YOU:   Understand these instructions.  Will watch your condition.  Will get help right away if you are not doing well or get worse. Document Released: 04/17/2007 Document Revised: 02/27/2012 Document Reviewed: 06/18/2007 ExitCare Patient Information 2014 ExitCare, [place].   ________________________________________________________________________  WHAT IS A BLOOD TRANSFUSION? Blood Transfusion Information  A transfusion is the replacement of blood or some of its parts. Blood is made up of multiple cells which provide different functions.  Red blood cells carry oxygen and are used for blood loss replacement.  White blood cells fight against infection.  Platelets control bleeding.  Plasma helps clot blood.  Other blood products are available for specialized needs, such as hemophilia or other clotting disorders. BEFORE THE TRANSFUSION  Who gives blood for transfusions?   Healthy volunteers who are fully evaluated to make sure their blood is safe. This is blood bank blood. Transfusion therapy is the safest it has ever been in the practice of medicine. Before blood is taken from a donor, a complete history  is taken to make sure that person has no history of diseases nor engages in risky social behavior (examples are intravenous drug use or sexual activity with multiple partners). The donor's travel history is screened to minimize risk of transmitting infections, such as malaria. The donated blood is tested for signs of infectious diseases, such as HIV and hepatitis. The blood is then tested to be sure it is compatible with you in order to minimize the chance of a transfusion reaction. If you or a relative donates blood, this is often done in anticipation of surgery and is not appropriate for emergency situations. It takes many days to process the donated blood. RISKS AND COMPLICATIONS Although transfusion therapy is very safe and saves many lives, the main dangers of transfusion include:   Getting an infectious disease.  Developing a transfusion reaction. This is an allergic reaction to something in the blood you were given. Every precaution is taken to prevent this. The decision to have a blood transfusion has been considered carefully by your caregiver before blood is given. Blood is not given unless the benefits outweigh the risks. AFTER THE TRANSFUSION  Right after receiving a blood transfusion, you  will usually feel much better and more energetic. This is especially true if your red blood cells have gotten low (anemic). The transfusion raises the level of the red blood cells which carry oxygen, and this usually causes an energy increase.  The nurse administering the transfusion will monitor you carefully for complications. HOME CARE INSTRUCTIONS  No special instructions are needed after a transfusion. You may find your energy is better. Speak with your caregiver about any limitations on activity for underlying diseases you may have. SEEK MEDICAL CARE IF:   Your condition is not improving after your transfusion.  You develop redness or irritation at the intravenous (IV) site. SEEK IMMEDIATE  MEDICAL CARE IF:  Any of the following symptoms occur over the next 12 hours:  Shaking chills.  You have a temperature by mouth above 102 F (38.9 C), not controlled by medicine.  Chest, back, or muscle pain.  People around you feel you are not acting correctly or are confused.  Shortness of breath or difficulty breathing.  Dizziness and fainting.  You get a rash or develop hives.  You have a decrease in urine output.  Your urine turns a dark color or changes to pink, red, or brown. Any of the following symptoms occur over the next 10 days:  You have a temperature by mouth above 102 F (38.9 C), not controlled by medicine.  Shortness of breath.  Weakness after normal activity.  The white part of the eye turns yellow (jaundice).  You have a decrease in the amount of urine or are urinating less often.  Your urine turns a dark color or changes to pink, red, or brown. Document Released: 12/02/2000 Document Revised: 02/27/2012 Document Reviewed: 07/21/2008 [facility] Patient Information 2014 [place], [place].  _______________________________________________________________________

## 2016-12-30 ENCOUNTER — Encounter (HOSPITAL_COMMUNITY)
Admission: RE | Admit: 2016-12-30 | Discharge: 2016-12-30 | Disposition: A | Discharge status: Home / Self Care | Payer: Medicare Other | Source: Ambulatory Visit | Attending: Specialist | Admitting: Specialist

## 2016-12-30 ENCOUNTER — Encounter (HOSPITAL_COMMUNITY): Payer: Self-pay

## 2016-12-30 DIAGNOSIS — I10 Essential (primary) hypertension: Secondary | ICD-10-CM | POA: Diagnosis not present

## 2016-12-30 DIAGNOSIS — I4892 Unspecified atrial flutter: Secondary | ICD-10-CM | POA: Diagnosis not present

## 2016-12-30 DIAGNOSIS — E669 Obesity, unspecified: Secondary | ICD-10-CM | POA: Insufficient documentation

## 2016-12-30 DIAGNOSIS — Z96652 Presence of left artificial knee joint: Secondary | ICD-10-CM | POA: Diagnosis not present

## 2016-12-30 DIAGNOSIS — M1990 Unspecified osteoarthritis, unspecified site: Secondary | ICD-10-CM | POA: Diagnosis not present

## 2016-12-30 DIAGNOSIS — Z01812 Encounter for preprocedural laboratory examination: Secondary | ICD-10-CM | POA: Insufficient documentation

## 2016-12-30 DIAGNOSIS — Z981 Arthrodesis status: Secondary | ICD-10-CM | POA: Diagnosis not present

## 2016-12-30 DIAGNOSIS — E785 Hyperlipidemia, unspecified: Secondary | ICD-10-CM | POA: Insufficient documentation

## 2016-12-30 HISTORY — DX: Other complications of anesthesia, initial encounter: T88.59XA

## 2016-12-30 HISTORY — DX: Adverse effect of unspecified anesthetic, initial encounter: T41.45XA

## 2016-12-30 LAB — URINALYSIS, ROUTINE W REFLEX MICROSCOPIC
BACTERIA UA: NONE SEEN
Bilirubin Urine: NEGATIVE
GLUCOSE, UA: NEGATIVE mg/dL
Hgb urine dipstick: NEGATIVE
KETONES UR: NEGATIVE mg/dL
Leukocytes, UA: NEGATIVE
Nitrite: NEGATIVE
PROTEIN: NEGATIVE mg/dL
Specific Gravity, Urine: 1.02 (ref 1.005–1.030)
Squamous Epithelial / LPF: NONE SEEN
pH: 5 (ref 5.0–8.0)

## 2016-12-30 LAB — PROTIME-INR
INR: 0.93
PROTHROMBIN TIME: 12.4 s (ref 11.4–15.2)

## 2016-12-30 LAB — CBC
HEMATOCRIT: 42.9 % (ref 39.0–52.0)
Hemoglobin: 14.5 g/dL (ref 13.0–17.0)
MCH: 32.3 pg (ref 26.0–34.0)
MCHC: 33.8 g/dL (ref 30.0–36.0)
MCV: 95.5 fL (ref 78.0–100.0)
Platelets: 170 10*3/uL (ref 150–400)
RBC: 4.49 MIL/uL (ref 4.22–5.81)
RDW: 14.2 % (ref 11.5–15.5)
WBC: 8.2 10*3/uL (ref 4.0–10.5)

## 2016-12-30 LAB — BASIC METABOLIC PANEL
ANION GAP: 8 (ref 5–15)
BUN: 29 mg/dL — ABNORMAL HIGH (ref 6–20)
CALCIUM: 9.5 mg/dL (ref 8.9–10.3)
CO2: 26 mmol/L (ref 22–32)
Chloride: 101 mmol/L (ref 101–111)
Creatinine, Ser: 0.96 mg/dL (ref 0.61–1.24)
GFR calc Af Amer: >60 mL/min (ref >60)
GLUCOSE: 103 mg/dL — AB (ref 65–99)
POTASSIUM: 4.4 mmol/L (ref 3.5–5.1)
Sodium: 135 mmol/L (ref 135–145)

## 2016-12-30 LAB — SURGICAL PCR SCREEN
MRSA, PCR: NEGATIVE
Staphylococcus aureus: NEGATIVE

## 2016-12-30 LAB — APTT: APTT: 25 s (ref 24–36)

## 2016-12-30 NOTE — Progress Notes (Signed)
bmet results faxed to dr collins pod by epic

## 2017-01-06 ENCOUNTER — Encounter (HOSPITAL_COMMUNITY)
Admission: RE | Disposition: A | Discharge status: Home Health | Payer: Self-pay | Source: Ambulatory Visit | Attending: Specialist

## 2017-01-06 ENCOUNTER — Inpatient Hospital Stay (HOSPITAL_COMMUNITY): Payer: Medicare Other | Admitting: Registered Nurse

## 2017-01-06 ENCOUNTER — Inpatient Hospital Stay (HOSPITAL_COMMUNITY)
Admission: RE | Admit: 2017-01-06 | Discharge: 2017-01-08 | DRG: 470 | Disposition: A | Discharge status: Home Health | Payer: Medicare Other | Source: Ambulatory Visit | Attending: Specialist | Admitting: Specialist

## 2017-01-06 ENCOUNTER — Encounter (HOSPITAL_COMMUNITY): Payer: Self-pay | Admitting: *Deleted

## 2017-01-06 DIAGNOSIS — I480 Paroxysmal atrial fibrillation: Secondary | ICD-10-CM | POA: Diagnosis present

## 2017-01-06 DIAGNOSIS — I10 Essential (primary) hypertension: Secondary | ICD-10-CM | POA: Diagnosis present

## 2017-01-06 DIAGNOSIS — Z6834 Body mass index (BMI) 34.0-34.9, adult: Secondary | ICD-10-CM | POA: Diagnosis not present

## 2017-01-06 DIAGNOSIS — Z96659 Presence of unspecified artificial knee joint: Secondary | ICD-10-CM

## 2017-01-06 DIAGNOSIS — Z86010 Personal history of colonic polyps: Secondary | ICD-10-CM

## 2017-01-06 DIAGNOSIS — M25561 Pain in right knee: Secondary | ICD-10-CM | POA: Diagnosis not present

## 2017-01-06 DIAGNOSIS — E785 Hyperlipidemia, unspecified: Secondary | ICD-10-CM | POA: Diagnosis present

## 2017-01-06 DIAGNOSIS — M1711 Unilateral primary osteoarthritis, right knee: Principal | ICD-10-CM | POA: Diagnosis present

## 2017-01-06 DIAGNOSIS — E669 Obesity, unspecified: Secondary | ICD-10-CM | POA: Diagnosis present

## 2017-01-06 DIAGNOSIS — Z8249 Family history of ischemic heart disease and other diseases of the circulatory system: Secondary | ICD-10-CM

## 2017-01-06 DIAGNOSIS — G8918 Other acute postprocedural pain: Secondary | ICD-10-CM | POA: Diagnosis not present

## 2017-01-06 DIAGNOSIS — Z981 Arthrodesis status: Secondary | ICD-10-CM | POA: Diagnosis not present

## 2017-01-06 HISTORY — PX: TOTAL KNEE ARTHROPLASTY: SHX125

## 2017-01-06 LAB — TYPE AND SCREEN
ABO/RH(D): O NEG
Antibody Screen: NEGATIVE

## 2017-01-06 SURGERY — ARTHROPLASTY, KNEE, TOTAL
Anesthesia: Spinal | Site: Knee | Laterality: Right

## 2017-01-06 MED ORDER — PROPOFOL 10 MG/ML IV BOLUS
INTRAVENOUS | Status: AC
  Filled 2017-01-06: qty 20

## 2017-01-06 MED ORDER — DEXAMETHASONE SODIUM PHOSPHATE 10 MG/ML IJ SOLN
10.0000 mg | Freq: Once | INTRAMUSCULAR | Status: AC
Start: 2017-01-07 — End: 2017-01-07
  Administered 2017-01-07: 09:00:00 10 mg via INTRAVENOUS
  Filled 2017-01-06: qty 1

## 2017-01-06 MED ORDER — EPHEDRINE 5 MG/ML INJ
INTRAVENOUS | Status: AC
  Filled 2017-01-06: qty 10

## 2017-01-06 MED ORDER — METHOCARBAMOL 500 MG PO TABS
500.0000 mg | ORAL_TABLET | Freq: Four times a day (QID) | ORAL | Status: DC | PRN
  Filled 2017-01-06: qty 1

## 2017-01-06 MED ORDER — ZOLPIDEM TARTRATE 5 MG PO TABS: 5.0000 mg | ORAL_TABLET | Freq: Every evening | ORAL | Status: DC | PRN

## 2017-01-06 MED ORDER — AMLODIPINE BESYLATE 5 MG PO TABS
5.0000 mg | ORAL_TABLET | Freq: Every day | ORAL | Status: DC
  Administered 2017-01-06: 5 mg via ORAL
  Filled 2017-01-06: qty 1

## 2017-01-06 MED ORDER — POVIDONE-IODINE 7.5 % EX SOLN: Freq: Once | CUTANEOUS | Status: DC

## 2017-01-06 MED ORDER — MIDAZOLAM HCL 2 MG/2ML IJ SOLN
INTRAMUSCULAR | Status: AC
  Filled 2017-01-06: qty 2

## 2017-01-06 MED ORDER — DIPHENHYDRAMINE HCL 12.5 MG/5ML PO ELIX: 12.5000 mg | ORAL_SOLUTION | ORAL | Status: DC | PRN

## 2017-01-06 MED ORDER — CEFAZOLIN SODIUM-DEXTROSE 2-4 GM/100ML-% IV SOLN
2.0000 g | INTRAVENOUS | Status: AC
Start: 2017-01-06 — End: 2017-01-06
  Administered 2017-01-06: 2 g via INTRAVENOUS
  Filled 2017-01-06: qty 100

## 2017-01-06 MED ORDER — DEXAMETHASONE SODIUM PHOSPHATE 10 MG/ML IJ SOLN
10.0000 mg | Freq: Once | INTRAMUSCULAR | Status: AC
  Administered 2017-01-06: 10 mg via INTRAVENOUS

## 2017-01-06 MED ORDER — BUPIVACAINE HCL (PF) 0.75 % IJ SOLN
INTRAMUSCULAR | Status: DC | PRN
  Administered 2017-01-06: 2 mL via INTRATHECAL

## 2017-01-06 MED ORDER — BUPIVACAINE-EPINEPHRINE (PF) 0.5% -1:200000 IJ SOLN
INTRAMUSCULAR | Status: DC | PRN
  Administered 2017-01-06: 25 mL via PERINEURAL

## 2017-01-06 MED ORDER — MIDAZOLAM HCL 2 MG/2ML IJ SOLN
1.0000 mg | Freq: Once | INTRAMUSCULAR | Status: AC
  Administered 2017-01-06: 1 mg via INTRAVENOUS

## 2017-01-06 MED ORDER — FERROUS SULFATE 325 (65 FE) MG PO TABS
325.0000 mg | ORAL_TABLET | Freq: Three times a day (TID) | ORAL | Status: DC
  Administered 2017-01-08: 325 mg via ORAL
  Filled 2017-01-06 (×3): qty 1

## 2017-01-06 MED ORDER — OXYCODONE HCL 5 MG PO TABS
5.0000 mg | ORAL_TABLET | ORAL | Status: DC | PRN
  Administered 2017-01-06: 15 mg via ORAL
  Administered 2017-01-06: 21:00:00 5 mg via ORAL
  Filled 2017-01-06: qty 3
  Filled 2017-01-06: qty 2
  Filled 2017-01-06: qty 3

## 2017-01-06 MED ORDER — ONDANSETRON HCL 4 MG/2ML IJ SOLN: 4.0000 mg | Freq: Four times a day (QID) | INTRAMUSCULAR | Status: DC | PRN

## 2017-01-06 MED ORDER — BUPIVACAINE HCL (PF) 0.25 % IJ SOLN
INTRAMUSCULAR | Status: AC
  Filled 2017-01-06: qty 30

## 2017-01-06 MED ORDER — MENTHOL 3 MG MT LOZG: 1.0000 | LOZENGE | OROMUCOSAL | Status: DC | PRN

## 2017-01-06 MED ORDER — METOCLOPRAMIDE HCL 5 MG/ML IJ SOLN: 5.0000 mg | Freq: Three times a day (TID) | INTRAMUSCULAR | Status: DC | PRN

## 2017-01-06 MED ORDER — ONDANSETRON HCL 4 MG/2ML IJ SOLN
INTRAMUSCULAR | Status: AC
  Filled 2017-01-06: qty 2

## 2017-01-06 MED ORDER — ACETAMINOPHEN 325 MG PO TABS
650.0000 mg | ORAL_TABLET | Freq: Four times a day (QID) | ORAL | Status: DC | PRN
  Administered 2017-01-07 (×2): 650 mg via ORAL
  Filled 2017-01-06 (×2): qty 2

## 2017-01-06 MED ORDER — STERILE WATER FOR IRRIGATION IR SOLN
Status: DC | PRN
  Administered 2017-01-06: 2000 mL

## 2017-01-06 MED ORDER — LIDOCAINE 2% (20 MG/ML) 5 ML SYRINGE
INTRAMUSCULAR | Status: AC
  Filled 2017-01-06: qty 5

## 2017-01-06 MED ORDER — KETOROLAC TROMETHAMINE 30 MG/ML IJ SOLN
INTRAMUSCULAR | Status: AC
  Filled 2017-01-06: qty 1

## 2017-01-06 MED ORDER — 0.9 % SODIUM CHLORIDE (POUR BTL) OPTIME
TOPICAL | Status: DC | PRN
  Administered 2017-01-06: 1000 mL

## 2017-01-06 MED ORDER — ASPIRIN EC 325 MG PO TBEC: 325.0000 mg | DELAYED_RELEASE_TABLET | Freq: Two times a day (BID) | ORAL | 0 refills | Status: DC

## 2017-01-06 MED ORDER — FENTANYL CITRATE (PF) 100 MCG/2ML IJ SOLN
INTRAMUSCULAR | Status: AC
  Filled 2017-01-06: qty 2

## 2017-01-06 MED ORDER — HYDROCHLOROTHIAZIDE 12.5 MG PO CAPS
12.5000 mg | ORAL_CAPSULE | Freq: Every day | ORAL | Status: DC
  Administered 2017-01-06: 15:00:00 12.5 mg via ORAL
  Filled 2017-01-06: qty 1

## 2017-01-06 MED ORDER — BENAZEPRIL HCL 20 MG PO TABS
20.0000 mg | ORAL_TABLET | Freq: Two times a day (BID) | ORAL | Status: DC
  Administered 2017-01-06 – 2017-01-07 (×2): 20 mg via ORAL
  Filled 2017-01-06 (×4): qty 1

## 2017-01-06 MED ORDER — FENTANYL CITRATE (PF) 100 MCG/2ML IJ SOLN
INTRAMUSCULAR | Status: DC | PRN
  Administered 2017-01-06 (×2): 50 ug via INTRAVENOUS

## 2017-01-06 MED ORDER — MAGNESIUM CITRATE PO SOLN: 1.0000 | Freq: Once | ORAL | Status: DC | PRN

## 2017-01-06 MED ORDER — PROPOFOL 500 MG/50ML IV EMUL
INTRAVENOUS | Status: DC | PRN
  Administered 2017-01-06: 75 ug/kg/min via INTRAVENOUS

## 2017-01-06 MED ORDER — EPINEPHRINE PF 1 MG/ML IJ SOLN
INTRAMUSCULAR | Status: AC
  Filled 2017-01-06: qty 1

## 2017-01-06 MED ORDER — POTASSIUM CHLORIDE IN NACL 20-0.9 MEQ/L-% IV SOLN
INTRAVENOUS | Status: DC
  Administered 2017-01-06: 15:00:00 via INTRAVENOUS
  Filled 2017-01-06 (×2): qty 1000

## 2017-01-06 MED ORDER — METOCLOPRAMIDE HCL 5 MG PO TABS: 5.0000 mg | ORAL_TABLET | Freq: Three times a day (TID) | ORAL | Status: DC | PRN

## 2017-01-06 MED ORDER — PROPOFOL 10 MG/ML IV BOLUS
INTRAVENOUS | Status: AC
  Filled 2017-01-06: qty 60

## 2017-01-06 MED ORDER — BUPIVACAINE HCL (PF) 0.25 % IJ SOLN
INTRAMUSCULAR | Status: DC | PRN
  Administered 2017-01-06: 30 mL

## 2017-01-06 MED ORDER — BACLOFEN 10 MG PO TABS: 10.0000 mg | ORAL_TABLET | Freq: Three times a day (TID) | ORAL | 2 refills | Status: DC | PRN

## 2017-01-06 MED ORDER — BISACODYL 5 MG PO TBEC: 5.0000 mg | DELAYED_RELEASE_TABLET | Freq: Every day | ORAL | Status: DC | PRN

## 2017-01-06 MED ORDER — METHOCARBAMOL 1000 MG/10ML IJ SOLN
500.0000 mg | Freq: Four times a day (QID) | INTRAVENOUS | Status: DC | PRN
  Administered 2017-01-06: 15:00:00 500 mg via INTRAVENOUS
  Filled 2017-01-06: qty 5
  Filled 2017-01-06: qty 550

## 2017-01-06 MED ORDER — EPHEDRINE SULFATE-NACL 50-0.9 MG/10ML-% IV SOSY
PREFILLED_SYRINGE | INTRAVENOUS | Status: DC | PRN
  Administered 2017-01-06 (×2): 10 mg via INTRAVENOUS

## 2017-01-06 MED ORDER — ACETAMINOPHEN 10 MG/ML IV SOLN
INTRAVENOUS | Status: AC
  Filled 2017-01-06: qty 100

## 2017-01-06 MED ORDER — ONDANSETRON HCL 4 MG PO TABS
4.0000 mg | ORAL_TABLET | Freq: Four times a day (QID) | ORAL | Status: DC | PRN
  Administered 2017-01-06: 21:00:00 4 mg via ORAL
  Filled 2017-01-06: qty 1

## 2017-01-06 MED ORDER — ONDANSETRON HCL 4 MG/2ML IJ SOLN
INTRAMUSCULAR | Status: DC | PRN
Start: 2017-01-06 — End: 2017-01-06
  Administered 2017-01-06: 4 mg via INTRAVENOUS

## 2017-01-06 MED ORDER — ENOXAPARIN SODIUM 30 MG/0.3ML ~~LOC~~ SOLN
30.0000 mg | Freq: Two times a day (BID) | SUBCUTANEOUS | Status: DC
  Administered 2017-01-07 – 2017-01-08 (×3): 30 mg via SUBCUTANEOUS
  Filled 2017-01-06 (×3): qty 0.3

## 2017-01-06 MED ORDER — SODIUM CHLORIDE 0.9 % IJ SOLN
INTRAMUSCULAR | Status: AC
  Filled 2017-01-06: qty 50

## 2017-01-06 MED ORDER — HYDROMORPHONE HCL 1 MG/ML IJ SOLN
1.0000 mg | INTRAMUSCULAR | Status: DC | PRN
  Administered 2017-01-06: 1 mg via INTRAVENOUS
  Filled 2017-01-06: qty 1

## 2017-01-06 MED ORDER — TRANEXAMIC ACID 1000 MG/10ML IV SOLN
1000.0000 mg | INTRAVENOUS | Status: AC
  Administered 2017-01-06: 1000 mg via INTRAVENOUS
  Filled 2017-01-06: qty 1100

## 2017-01-06 MED ORDER — CEFAZOLIN SODIUM-DEXTROSE 2-4 GM/100ML-% IV SOLN
INTRAVENOUS | Status: AC
  Filled 2017-01-06: qty 100

## 2017-01-06 MED ORDER — SODIUM CHLORIDE 0.9 % IJ SOLN
INTRAMUSCULAR | Status: DC | PRN
  Administered 2017-01-06: 29 mL

## 2017-01-06 MED ORDER — FENTANYL CITRATE (PF) 100 MCG/2ML IJ SOLN
50.0000 ug | Freq: Once | INTRAMUSCULAR | Status: AC
  Administered 2017-01-06: 50 ug via INTRAVENOUS

## 2017-01-06 MED ORDER — HYDROMORPHONE HCL 1 MG/ML IJ SOLN: 0.2500 mg | INTRAMUSCULAR | Status: DC | PRN

## 2017-01-06 MED ORDER — SODIUM CHLORIDE 0.9 % IR SOLN
Status: DC | PRN
  Administered 2017-01-06: 1000 mL

## 2017-01-06 MED ORDER — DEXAMETHASONE SODIUM PHOSPHATE 10 MG/ML IJ SOLN
INTRAMUSCULAR | Status: AC
  Filled 2017-01-06: qty 1

## 2017-01-06 MED ORDER — KETOROLAC TROMETHAMINE 30 MG/ML IJ SOLN
INTRAMUSCULAR | Status: DC | PRN
  Administered 2017-01-06: 30 mg

## 2017-01-06 MED ORDER — PROMETHAZINE HCL 25 MG/ML IJ SOLN: 6.2500 mg | INTRAMUSCULAR | Status: DC | PRN

## 2017-01-06 MED ORDER — DOCUSATE SODIUM 100 MG PO CAPS
100.0000 mg | ORAL_CAPSULE | Freq: Two times a day (BID) | ORAL | Status: DC
  Administered 2017-01-06 – 2017-01-08 (×3): 100 mg via ORAL
  Filled 2017-01-06 (×4): qty 1

## 2017-01-06 MED ORDER — LACTATED RINGERS IV SOLN
INTRAVENOUS | Status: DC
  Administered 2017-01-06 (×2): via INTRAVENOUS

## 2017-01-06 MED ORDER — ACETAMINOPHEN 10 MG/ML IV SOLN
INTRAVENOUS | Status: DC | PRN
  Administered 2017-01-06: 1000 mg via INTRAVENOUS

## 2017-01-06 MED ORDER — PHENOL 1.4 % MT LIQD
1.0000 | OROMUCOSAL | Status: DC | PRN
  Filled 2017-01-06: qty 177

## 2017-01-06 MED ORDER — LIDOCAINE 2% (20 MG/ML) 5 ML SYRINGE
INTRAMUSCULAR | Status: DC | PRN
  Administered 2017-01-06: 20 mg via INTRAVENOUS

## 2017-01-06 MED ORDER — BUPIVACAINE HCL (PF) 0.5 % IJ SOLN
INTRAMUSCULAR | Status: AC
  Filled 2017-01-06: qty 30

## 2017-01-06 MED ORDER — ALUM & MAG HYDROXIDE-SIMETH 200-200-20 MG/5ML PO SUSP: 30.0000 mL | ORAL | Status: DC | PRN

## 2017-01-06 MED ORDER — CEFAZOLIN SODIUM-DEXTROSE 2-4 GM/100ML-% IV SOLN
2.0000 g | Freq: Four times a day (QID) | INTRAVENOUS | Status: AC
  Administered 2017-01-06 (×2): 2 g via INTRAVENOUS
  Filled 2017-01-06 (×2): qty 100

## 2017-01-06 MED ORDER — ACETAMINOPHEN 650 MG RE SUPP: 650.0000 mg | Freq: Four times a day (QID) | RECTAL | Status: DC | PRN

## 2017-01-06 MED ORDER — OXYCODONE HCL 5 MG PO TABS: 5.0000 mg | ORAL_TABLET | ORAL | 0 refills | Status: DC | PRN

## 2017-01-06 MED ORDER — PROPOFOL 10 MG/ML IV BOLUS
INTRAVENOUS | Status: DC | PRN
  Administered 2017-01-06: 20 mg via INTRAVENOUS

## 2017-01-06 MED ORDER — SENNOSIDES-DOCUSATE SODIUM 8.6-50 MG PO TABS: 1.0000 | ORAL_TABLET | Freq: Every evening | ORAL | Status: DC | PRN

## 2017-01-06 SURGICAL SUPPLY — 57 items
BAG ZIPLOCK 12X15 (MISCELLANEOUS) ×6 IMPLANT
BANDAGE ACE 4X5 VEL STRL LF (GAUZE/BANDAGES/DRESSINGS) ×3 IMPLANT
BANDAGE ACE 6X5 VEL STRL LF (GAUZE/BANDAGES/DRESSINGS) ×3 IMPLANT
BLADE SAG 18X100X1.27 (BLADE) ×3 IMPLANT
BLADE SAW SGTL 13.0X1.19X90.0M (BLADE) ×3 IMPLANT
CAP KNEE TOTAL 3 SIGMA ×3 IMPLANT
CEMENT HV SMART SET (Cement) ×6 IMPLANT
CLOTH BEACON ORANGE TIMEOUT ST (SAFETY) ×3 IMPLANT
CUFF TOURN SGL QUICK 34 (TOURNIQUET CUFF) ×2
CUFF TRNQT CYL 34X4X40X1 (TOURNIQUET CUFF) ×1 IMPLANT
DECANTER SPIKE VIAL GLASS SM (MISCELLANEOUS) ×3 IMPLANT
DERMABOND ADVANCED (GAUZE/BANDAGES/DRESSINGS) ×2
DERMABOND ADVANCED .7 DNX12 (GAUZE/BANDAGES/DRESSINGS) ×1 IMPLANT
DRAPE U-SHAPE 47X51 STRL (DRAPES) ×3 IMPLANT
DRSG AQUACEL AG ADV 3.5X10 (GAUZE/BANDAGES/DRESSINGS) ×3 IMPLANT
DRSG TEGADERM 4X4.75 (GAUZE/BANDAGES/DRESSINGS) ×3 IMPLANT
DURAPREP 26ML APPLICATOR (WOUND CARE) ×6 IMPLANT
ELECT REM PT RETURN 9FT ADLT (ELECTROSURGICAL) ×3
ELECTRODE REM PT RTRN 9FT ADLT (ELECTROSURGICAL) ×1 IMPLANT
EVACUATOR 1/8 PVC DRAIN (DRAIN) ×3 IMPLANT
GAUZE SPONGE 2X2 8PLY STRL LF (GAUZE/BANDAGES/DRESSINGS) ×1 IMPLANT
GLOVE BIOGEL PI IND STRL 7.5 (GLOVE) ×5 IMPLANT
GLOVE BIOGEL PI IND STRL 8 (GLOVE) ×2 IMPLANT
GLOVE BIOGEL PI INDICATOR 7.5 (GLOVE) ×10
GLOVE BIOGEL PI INDICATOR 8 (GLOVE) ×4
GLOVE ECLIPSE 8.0 STRL XLNG CF (GLOVE) ×12 IMPLANT
GLOVE SURG ORTHO 9.0 STRL STRW (GLOVE) ×3 IMPLANT
GLOVE SURG SS PI 7.0 STRL IVOR (GLOVE) ×3 IMPLANT
GLOVE SURG SS PI 7.5 STRL IVOR (GLOVE) ×6 IMPLANT
GOWN STRL REUS W/TWL XL LVL3 (GOWN DISPOSABLE) ×12 IMPLANT
HANDPIECE INTERPULSE COAX TIP (DISPOSABLE) ×2
IMMOBILIZER KNEE 20 (SOFTGOODS) ×3
IMMOBILIZER KNEE 20 THIGH 36 (SOFTGOODS) ×1 IMPLANT
PACK TOTAL KNEE CUSTOM (KITS) ×3 IMPLANT
POSITIONER SURGICAL ARM (MISCELLANEOUS) ×3 IMPLANT
SET HNDPC FAN SPRY TIP SCT (DISPOSABLE) ×1 IMPLANT
SET PAD KNEE POSITIONER (MISCELLANEOUS) ×3 IMPLANT
SPONGE GAUZE 2X2 STER 10/PKG (GAUZE/BANDAGES/DRESSINGS) ×2
SPONGE LAP 18X18 X RAY DECT (DISPOSABLE) IMPLANT
SPONGE SURGIFOAM ABS GEL 100 (HEMOSTASIS) ×3 IMPLANT
STOCKINETTE 6  STRL (DRAPES) ×2
STOCKINETTE 6 STRL (DRAPES) ×1 IMPLANT
SUCTION FRAZIER 12FR DISP (SUCTIONS) ×3 IMPLANT
SUCTION FRAZIER HANDLE 12FR (TUBING) ×2
SUCTION TUBE FRAZIER 12FR DISP (TUBING) ×1 IMPLANT
SUT BONE WAX W31G (SUTURE) IMPLANT
SUT MNCRL AB 3-0 PS2 18 (SUTURE) ×3 IMPLANT
SUT VIC AB 1 CT1 27 (SUTURE) ×8
SUT VIC AB 1 CT1 27XBRD ANTBC (SUTURE) ×4 IMPLANT
SUT VIC AB 2-0 CT1 27 (SUTURE) ×4
SUT VIC AB 2-0 CT1 TAPERPNT 27 (SUTURE) ×2 IMPLANT
SUT VLOC 180 0 24IN GS25 (SUTURE) ×3 IMPLANT
TAPE STRIPS DRAPE STRL (GAUZE/BANDAGES/DRESSINGS) ×3 IMPLANT
TOWER CARTRIDGE SMART MIX (DISPOSABLE) ×3 IMPLANT
TRAY FOLEY W/METER SILVER 16FR (SET/KITS/TRAYS/PACK) ×3 IMPLANT
WRAP KNEE MAXI GEL POST OP (GAUZE/BANDAGES/DRESSINGS) ×3 IMPLANT
YANKAUER SUCT BULB TIP 10FT TU (MISCELLANEOUS) ×3 IMPLANT

## 2017-01-06 NOTE — Interval H&P Note (Signed)
History and Physical Interval Note:  01/06/2017 10:08 AM  [name]  has presented today for surgery, with the diagnosis of right knee osteoarthritis  The various methods of treatment have been discussed with the patient and family. After consideration of risks, benefits and other options for treatment, the patient has consented to  Procedure(s): RIGHT TOTAL KNEE ARTHROPLASTY (Right) as a surgical intervention .  The patient's history has been reviewed, patient examined, no change in status, stable for surgery.  I have reviewed the patient's chart and labs.  Questions were answered to the patient's satisfaction.     [name] ANDREW

## 2017-01-06 NOTE — Anesthesia Procedure Notes (Signed)
Spinal  Patient location during procedure: OR End time: 01/06/2017 10:50 AM Staffing Anesthesiologist: Rica Koyanagi Performed: anesthesiologist  Spinal Block Patient position: sitting Prep: Betadine Patient monitoring: heart rate, cardiac monitor, continuous pulse ox and blood pressure Approach: right paramedian Location: L3-4 Needle Needle type: [place]ke  Needle gauge: 22 G Needle length: 9 cm Needle insertion depth: 5 cm Assessment Sensory level: T6 Additional Notes Initial heme colored then cleared c good flow of CSF

## 2017-01-06 NOTE — Progress Notes (Signed)
PT Cancellation Note  Patient Details Name: [name] MRN: [id number] Tresa Endo PT [id number]   DOB: [birth year]   Cancelled Treatment:    Reason Eval/Treat Not Completed: Medical issues which prohibited therapy (not ready, medicated and asleep.)   Claretha Cooper 01/06/2017, 4:17 PM

## 2017-01-06 NOTE — Anesthesia Procedure Notes (Addendum)
Anesthesia Regional Block:  Adductor canal block  Pre-Anesthetic Checklist: ,, timeout performed, Correct Patient, Correct Site, Correct Laterality, Correct Procedure, Correct Position, site marked, Risks and benefits discussed,  Surgical consent,  Pre-op evaluation,  At surgeon's request and post-op pain management  Laterality: Right and Lower  Prep: chloraprep       Needles:   Needle Type: Echogenic Stimulator Needle     Needle Length: 9cm 9 cm Needle Gauge: 21 and 21 G  Needle insertion depth: 6 cm   Additional Needles:  Procedures: ultrasound guided (picture in chart) Adductor canal block Narrative:  Start time: 01/06/2017 9:55 AM End time: 01/06/2017 10:05 AM Injection made incrementally with aspirations every 5 mL.  Performed by: Personally  Anesthesiologist: Leara Rawl

## 2017-01-06 NOTE — Transfer of Care (Signed)
Immediate Anesthesia Transfer of Care Note  Patient: [name]  Procedure(s) Performed: Procedure(s) with comments: RIGHT TOTAL KNEE ARTHROPLASTY (Right) - Adductior Block  Patient Location: PACU  Anesthesia Type:Spinal  Level of Consciousness: sedated, patient cooperative and responds to stimulation  Airway & Oxygen Therapy: Patient Spontanous Breathing and Patient connected to nasal cannula oxygen  Post-op Assessment: Report given to RN and Post -op Vital signs reviewed and stable  Post vital signs: Reviewed and stable  Last Vitals:  Vitals:   01/06/17 1003 01/06/17 1004  BP:    Pulse: 71 72  Resp: 18 17  Temp:      Last Pain:  Vitals:   01/06/17 0833  TempSrc: Oral  PainSc:       Patients Stated Pain Goal: 5 ([phone] [id number])  Complications: No apparent anesthesia complications
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
## 2017-01-06 NOTE — Op Note (Signed)
DATE OF SURGERY:  01/06/2017  TIME: 12:39 PM  PATIENT NAME:  [name]    AGE: 71 y.o.   PRE-OPERATIVE DIAGNOSIS:  right knee osteoarthritis  POST-OPERATIVE DIAGNOSIS:  right knee osteoarthritis  PROCEDURE:  Procedure(s): RIGHT TOTAL KNEE ARTHROPLASTY  SURGEON:  Takeisha Cianci ANDREW  ASSISTANT:  Bryson Stilwell, PA-C, present and scrubbed throughout the case, critical for assistance with exposure, retraction, instrumentation, and closure.  OPERATIVE IMPLANTS: Depuy PFC Sigma Rotating Platform.  Femur size 5, Tibia size 5, Patella size 38 3-peg oval button, with a 10 mm polyethylene insert.   PREOPERATIVE INDICATIONS:   [name] is a 71 y.o. year old male with end stage bone on bone arthritis of the knee who failed conservative treatment and elected for Total Knee Arthroplasty.   The risks, benefits, and alternatives were discussed at length including but not limited to the risks of infection, bleeding, nerve injury, stiffness, blood clots, the need for revision surgery, cardiopulmonary complications, among others, and they were willing to proceed.  OPERATIVE DESCRIPTION:  The patient was brought to the operative room and placed in a supine position.  Spinal anesthesia was administered.  IV antibiotics were given.  The lower extremity was prepped and draped in the usual sterile fashion.  Time out was performed.  The leg was elevated and exsanguinated and the tourniquet was inflated.  Anterior quadriceps tendon splitting approach was performed.  The patella was retracted and osteophytes were removed.  The anterior horn of the medial and lateral meniscus was removed and cruciate ligaments resected.   The distal femur was opened with the drill and the intramedullary distal femoral cutting jig was utilized, set at 5 degrees resecting 10 mm off the distal femur.  Care was taken to protect the collateral ligaments.  The distal femoral sizing jig was applied, taking care to  avoid notching.  Then the 4-in-1 cutting jig was applied and the anterior and posterior femur was cut, along with the chamfer cuts.    Then the extramedullary tibial cutting jig was utilized making the appropriate cut using the anterior tibial crest as a reference building in appropriate posterior slope.  Care was taken during the cut to protect the medial and collateral ligaments.  The proximal tibia was removed along with the posterior horns of the menisci.   The posterior medial femoral osteophytes and posterior lateral femoral osteophytes were removed.    The flexion gap was then measured and was symmetric with the extension gap, measured at 10.  I completed the distal femoral preparation using the appropriate jig to prepare the box.  The patella was then measured, and cut with the saw.    The proximal tibia sized and prepared accordingly with the reamer and the punch, and then all components were trialed with the trial insert.  The knee was found to have excellent balance and full motion.    The above named components were then cemented into place and all excess cement was removed.  The trial polyethylene component was in place during cementation, and then was exchanged for the real polyethylene component.    The knee was easily taken through a range of motion and the patella tracked well and the knee irrigated copiously and the parapatellar and subcutaneous tissue closed with vicryl, and monocryl with steri strips for the skin.  The arthrotomy was closed at 90 of flexion. The wounds were dressed with sterile gauze and the tourniquet released and the patient was awakened and returned to the PACU  in stable and satisfactory condition.  There were no complications.  Total tourniquet time was 90 minutes.

## 2017-01-06 NOTE — Anesthesia Postprocedure Evaluation (Addendum)
Anesthesia Post Note  Patient: [name]  Procedure(s) Performed: Procedure(s) (LRB): RIGHT TOTAL KNEE ARTHROPLASTY (Right)  Patient location during evaluation: PACU Anesthesia Type: Spinal Level of consciousness: oriented and awake and alert Pain management: pain level controlled Vital Signs Assessment: post-procedure vital signs reviewed and stable Respiratory status: spontaneous breathing, respiratory function stable and patient connected to nasal cannula oxygen Cardiovascular status: blood pressure returned to baseline and stable Postop Assessment: no headache and no backache Anesthetic complications: no       Last Vitals:  Vitals:   01/06/17 1330 01/06/17 1345  BP: (!) 142/72 136/77  Pulse: 71 68  Resp: 10 (!) 9  Temp:  36.5 C    Last Pain:  Vitals:   01/06/17 1345  TempSrc:   PainSc: 0-No pain    LLE Motor Response: Purposeful movement (01/06/17 1345) LLE Sensation: Decreased (01/06/17 1345) RLE Motor Response: Purposeful movement (01/06/17 1345) RLE Sensation: Decreased (01/06/17 1345) L Sensory Level: L3-Anterior knee, lower leg (01/06/17 1345) R Sensory Level: L3-Anterior knee, lower leg (01/06/17 1345)  Nello Corro,JAMES TERRILL

## 2017-01-06 NOTE — Anesthesia Preprocedure Evaluation (Addendum)
Anesthesia Evaluation  Patient identified by MRN, date of birth, ID band Patient awake    Reviewed: Allergy & Precautions, NPO status , Patient's Chart, lab work & pertinent test results  Airway Mallampati: II  TM Distance: <3 FB Neck ROM: Limited   Comment: Neck is fused from previious surgery, no movement Dental  (+) Teeth Intact, Dental Advisory Given, Caps   Pulmonary    breath sounds clear to auscultation       Cardiovascular hypertension, + dysrhythmias  Rhythm:Regular Rate:Normal  Nl Echo in past   Neuro/Psych  Neuromuscular disease    GI/Hepatic negative GI ROS, Neg liver ROS,   Endo/Other  negative endocrine ROS  Renal/GU negative Renal ROS     Musculoskeletal  (+) Arthritis ,   Abdominal (+) + obese,   Peds  Hematology   Anesthesia Other Findings   Reproductive/Obstetrics                            Anesthesia Physical Anesthesia Plan  ASA: II  Anesthesia Plan: Spinal   Post-op Pain Management:  Regional for Post-op pain   Induction: Intravenous  Airway Management Planned: Natural Airway and Simple Face Mask  Additional Equipment:   Intra-op Plan:   Post-operative Plan:   Informed Consent: I have reviewed the patients History and Physical, chart, labs and discussed the procedure including the risks, benefits and alternatives for the proposed anesthesia with the patient or authorized representative who has indicated his/her understanding and acceptance.   Dental advisory given  Plan Discussed with: CRNA  Anesthesia Plan Comments: (Has had multi level back decompression but still prefers SAB, discussed c patient)      Anesthesia Quick Evaluation

## 2017-01-06 NOTE — Progress Notes (Signed)
AssistedDr. Massagee with right, ultrasound guided, adductor canal block. Side rails up, monitors on throughout procedure. See vital signs in flow sheet. Tolerated Procedure well.  

## 2017-01-07 LAB — CBC
HCT: 38.2 % — ABNORMAL LOW (ref 39.0–52.0)
Hemoglobin: 13.2 g/dL (ref 13.0–17.0)
MCH: 32.9 pg (ref 26.0–34.0)
MCHC: 34.6 g/dL (ref 30.0–36.0)
MCV: 95.3 fL (ref 78.0–100.0)
PLATELETS: 173 10*3/uL (ref 150–400)
RBC: 4.01 MIL/uL — AB (ref 4.22–5.81)
RDW: 14.1 % (ref 11.5–15.5)
WBC: 16.1 10*3/uL — ABNORMAL HIGH (ref 4.0–10.5)

## 2017-01-07 LAB — BASIC METABOLIC PANEL
Anion gap: 9 (ref 5–15)
BUN: 20 mg/dL (ref 6–20)
CO2: 27 mmol/L (ref 22–32)
CREATININE: 0.84 mg/dL (ref 0.61–1.24)
Calcium: 9.1 mg/dL (ref 8.9–10.3)
Chloride: 99 mmol/L — ABNORMAL LOW (ref 101–111)
GFR calc Af Amer: >60 mL/min (ref >60)
GLUCOSE: 145 mg/dL — AB (ref 65–99)
POTASSIUM: 4.8 mmol/L (ref 3.5–5.1)
Sodium: 135 mmol/L (ref 135–145)

## 2017-01-07 MED ORDER — HYDROCODONE-ACETAMINOPHEN 7.5-325 MG PO TABS
1.0000 | ORAL_TABLET | ORAL | Status: DC | PRN
  Administered 2017-01-07 – 2017-01-08 (×3): 1 via ORAL
  Filled 2017-01-07 (×3): qty 1

## 2017-01-07 NOTE — Evaluation (Signed)
Physical Therapy Evaluation Patient Details Name: [name] MRN: [id number] DOB: 1946-12-17 Today's Date: 01/07/2017   History of Present Illness  r tka  Clinical Impression  The patient is progressing very well. Feels medication is cause of dizziness. Pt admitted with above diagnosis. Pt currently with functional limitations due to the deficits listed below (see PT Problem List).  Pt will benefit from skilled PT to increase their independence and safety with mobility to allow discharge to the venue listed below.       Follow Up Recommendations Outpatient PT;Supervision/Assistance - 24 hour    Equipment Recommendations  Rolling walker with 5" wheels    Recommendations for Other Services       Precautions / Restrictions Precautions Precautions: Fall;Knee Precaution Comments: did not require KI Required Braces or Orthoses: Knee Immobilizer - Right Knee Immobilizer - Right: Discontinue once straight leg raise with < 10 degree lag      Mobility  Bed Mobility Overal bed mobility: Needs Assistance Bed Mobility: Supine to Sit     Supine to sit: Min guard     General bed mobility comments: manages right leg without assitance  Transfers Overall transfer level: Needs assistance Equipment used: Rolling walker (2 wheeled) Transfers: Sit to/from Stand Sit to Stand: Min assist         General transfer comment: cues for hand and right leg position  Ambulation/Gait Ambulation/Gait assistance: Min assist Ambulation Distance (Feet): 180 Feet Assistive device: Rolling walker (2 wheeled) Gait Pattern/deviations: Step-to pattern;Step-through pattern;Antalgic     General Gait Details: cues for sequence. patient complains of feeling dizzy. BP pre 130/51, post 128/49  Stairs            Wheelchair Mobility    Modified Rankin (Stroke Patients Only)       Balance                                             Pertinent Vitals/Pain Pain  Assessment: 0-10 Pain Score: 2  Pain Location: right thigh Pain Descriptors / Indicators: Sore Pain Intervention(s): Monitored during session;Premedicated before session;Ice applied    Home Living Family/patient expects to be discharged to:: Private residence Living Arrangements: Spouse/significant other Available Help at Discharge: Family Type of Home: House Home Access: Stairs to enter   Technical brewer of Steps: 1 Home Layout: One level Home Equipment: Environmental consultant - 2 wheels      Prior Function Level of Independence: Independent               Hand Dominance        Extremity/Trunk Assessment        Lower Extremity Assessment Lower Extremity Assessment: RLE deficits/detail RLE Deficits / Details: able to SLR, knee flexed 80    Cervical / Trunk Assessment Cervical / Trunk Assessment: Other exceptions Cervical / Trunk Exceptions: neck fusion  Communication   Communication: No difficulties  Cognition Arousal/Alertness: Awake/alert Behavior During Therapy: WFL for tasks assessed/performed Overall Cognitive Status: Within Functional Limits for tasks assessed                      General Comments      Exercises Total Joint Exercises Ankle Circles/Pumps: AROM;Both;10 reps;Supine Quad Sets: AROM;Both;10 reps;Supine Short Arc Quad: AROM;Right;10 reps;Supine Heel Slides: AROM;Right;10 reps;Supine Hip ABduction/ADduction: AROM;Right;10 reps;Supine Straight Leg Raises: AROM;Right;10 reps;Supine Long Arc Quad: AROM;Right;10 reps;Seated  Assessment/Plan    PT Assessment Patient needs continued PT services  PT Problem List Decreased strength;Decreased range of motion;Decreased activity tolerance;Decreased mobility;Pain;Decreased knowledge of precautions          PT Treatment Interventions DME instruction;Gait training;Stair training;Functional mobility training;Therapeutic activities;Patient/family education    PT Goals (Current goals can be  found in the Care Plan section)  Acute Rehab PT Goals Patient Stated Goal: to go home PT Goal Formulation: With patient/family Time For Goal Achievement: 01/10/17 Potential to Achieve Goals: Good    Frequency 7X/week   Barriers to discharge        Co-evaluation               End of Session   Activity Tolerance: Patient tolerated treatment well Patient left: in chair;with call bell/phone within reach;with family/visitor present Nurse Communication: Mobility status         Time: [id number] PT Time Calculation (min) (ACUTE ONLY): 46 min   Charges:   PT Evaluation $PT Eval Low Complexity: 1 Procedure PT Treatments $Gait Training: 8-22 mins $Therapeutic Exercise: 8-22 mins   PT G Codes:        Claretha Cooper 01/07/2017, 12:45 PM Tresa Endo PT [phone]

## 2017-01-07 NOTE — Progress Notes (Signed)
Physical Therapy Treatment Patient Details Name: TYSON PICKRON MRN: [id number] DOB: [birth year] Today's Date: 01/07/2017    History of Present Illness r tka    PT Comments    The patient continues complaints of his head not feeling "right". Pain of the right knee is mild. Continue /PT  Follow Up Recommendations  Outpatient PT;Supervision/Assistance - 24 hour     Equipment Recommendations  None recommended by PT    Recommendations for Other Services       Precautions / Restrictions Precautions Precautions: Fall;Knee Precaution Comments: did not require KI Required Braces or Orthoses: Knee Immobilizer - Right Knee Immobilizer - Right: Discontinue once straight leg raise with < 10 degree lag Restrictions Weight Bearing Restrictions: No    Mobility  Bed Mobility Overal bed mobility: Needs Assistance Bed Mobility: Sit to Supine     Supine to sit: Min guard Sit to supine: Supervision   General bed mobility comments: patient manages the  right leg  Transfers Overall transfer level: Needs assistance Equipment used: Rolling walker (2 wheeled) Transfers: Sit to/from Stand Sit to Stand: Min assist         General transfer comment: cues for hand and R leg position  Ambulation/Gait Ambulation/Gait assistance: Min assist Ambulation Distance (Feet): 180 Feet Assistive device: Rolling walker (2 wheeled) Gait Pattern/deviations: Step-to pattern;Step-through pattern;Antalgic     General Gait Details: cues for sequence. patient complains of feeling dizzy. BP pre 130/51, post 128/49   Stairs            Wheelchair Mobility    Modified Rankin (Stroke Patients Only)       Balance                                    Cognition Arousal/Alertness: Awake/alert Behavior During Therapy: WFL for tasks assessed/performed Overall Cognitive Status: Within Functional Limits for tasks assessed                      Exercises Total Joint  Exercises Ankle Circles/Pumps: AROM;Both;10 reps;Supine Quad Sets: AROM;Both;10 reps;Supine Short Arc Quad: AROM;Right;10 reps;Supine Heel Slides: AROM;Right;10 reps;Supine Hip ABduction/ADduction: AROM;Right;10 reps;Supine Straight Leg Raises: AROM;Right;10 reps;Supine Long Arc Quad: AROM;Right;10 reps;Seated    General Comments        Pertinent Vitals/Pain Pain Assessment: 0-10 Pain Score: 1  Pain Location: right thigh Pain Descriptors / Indicators: Sore Pain Intervention(s): Monitored during session;Heat applied;Ice applied    Home Living Family/patient expects to be discharged to:: Private residence Living Arrangements: Spouse/significant other Available Help at Discharge: Family;Available 24 hours/day Type of Home: House Home Access: Stairs to enter   Home Layout: One level Home Equipment: Environmental consultant - 2 wheels;Shower seat - built in;Grab bars - tub/shower;Hand held shower head      Prior Function Level of Independence: Independent          PT Goals (current goals can now be found in the care plan section) Acute Rehab PT Goals Patient Stated Goal: to go home PT Goal Formulation: With patient/family Time For Goal Achievement: 01/10/17 Potential to Achieve Goals: Good Progress towards PT goals: Progressing toward goals    Frequency    7X/week      PT Plan Current plan remains appropriate    Co-evaluation             End of Session   Activity Tolerance: Patient tolerated treatment well Patient left: in bed;with family/visitor present  Time: [id number] PT Time Calculation (min) (ACUTE ONLY): 16 min  Charges:  $Gait Training: 8-22 mins $Therapeutic Exercise: 8-22 mins                    G Codes:      Claretha Cooper 01/07/2017, 3:22 PM

## 2017-01-07 NOTE — Progress Notes (Signed)
   Subjective: 1 Day Post-Op Procedure(s) (LRB): RIGHT TOTAL KNEE ARTHROPLASTY (Right)  Doing fairly well this morning Mild to moderate pain but pain is not as severe as left knee Denies any new symptoms or issues Patient reports pain as moderate.  Objective:   VITALS:   Vitals:   01/07/17 0134 01/07/17 0609  BP: 131/64 (!) 124/59  Pulse: (!) 59 60  Resp: 16 16  Temp: 98.4 F (36.9 C) 97.8 F (36.6 C)    Right knee dressing intact nv intact distally No rashes or edema Drain pulled  LABS  Recent Labs  01/07/17 0441  HGB 13.2  HCT 38.2*  WBC 16.1*  PLT 173     Recent Labs  01/07/17 0441  NA 135  K 4.8  BUN 20  CREATININE 0.84  GLUCOSE 145*     Assessment/Plan: 1 Day Post-Op Procedure(s) (LRB): RIGHT TOTAL KNEE ARTHROPLASTY (Right) PT/OT Pain management as needed D/c planning Pulmonary toilet    Brad Arabelle Bollig, MPAS, PA-C  01/07/2017, 7:40 AM

## 2017-01-07 NOTE — Evaluation (Signed)
Occupational Therapy Evaluation and Discharge Patient Details Name: [name] MRN: [id number] DOB: [birth year] Today's Date: 01/07/2017    History of Present Illness [name]. PMH: neck fusion, L TKA 10 years ago   Clinical Impression   Pt was independent prior to admission. Presents with R knee pain and decreased standing balance. Pt with c/o dizziness, but BP WNL. Educated pt and wife on compensatory strategies and use of AE for LB bathing and dressing, shower and toilet transfer. Instructed in transporting items safely with RW, removing fall hazards and safe footwear. Pt and wife verbalizing understanding. Pt is refusing a 3 in 1.    Follow Up Recommendations  No OT follow up    Equipment Recommendations  None recommended by OT    Recommendations for Other Services       Precautions / Restrictions Precautions Precautions: Fall;Knee Precaution Comments: did not require KI Required Braces or Orthoses: Knee Immobilizer - Right Knee Immobilizer - Right: Discontinue once straight leg raise with < 10 degree lag Restrictions Weight Bearing Restrictions: No      Mobility Bed Mobility   General bed mobility comments: in chair  Transfers Overall transfer level: Needs assistance Equipment used: Rolling walker (2 wheeled) Transfers: Sit to/from Stand Sit to Stand: Min assist         General transfer comment: cues for hand and R leg position    Balance                                            ADL Overall ADL's : Needs assistance/impaired Eating/Feeding: Independent;Sitting   Grooming: Wash/dry hands;Standing;Min guard   Upper Body Bathing: Set up;Sitting   Lower Body Bathing: Minimal assistance;Sit to/from stand Lower Body Bathing Details (indicate cue type and reason): instructed in use of long handled bath sponge Upper Body Dressing : Set up;Sitting   Lower Body Dressing: Minimal assistance;Sit to/from stand Lower Body Dressing Details  (indicate cue type and reason): Pt not interested in AE, will have wife assist. Instructed to dress operated leg first, undress last. Toilet Transfer: Minimal assistance;Ambulation;RW;BSC Toilet Transfer Details (indicate cue type and reason): educated pt in benefits of a 3 in 1, pt is not interested Writer and Hygiene: Minimal assistance       Functional mobility during ADLs: Minimal assistance;Rolling walker       Vision     Perception     Praxis      Pertinent Vitals/Pain Pain Assessment: 0-10 Pain Score: 2  Pain Location: right thigh Pain Descriptors / Indicators: Sore Pain Intervention(s): Monitored during session;Premedicated before session;Repositioned;Ice applied     Hand Dominance Right   Extremity/Trunk Assessment Upper Extremity Assessment Upper Extremity Assessment: Overall WFL for tasks assessed   Lower Extremity Assessment Lower Extremity Assessment: Defer to PT evaluation RLE Deficits / Details: able to SLR, knee flexed 80   Cervical / Trunk Assessment Cervical / Trunk Assessment: Other exceptions Cervical / Trunk Exceptions: neck fusion   Communication Communication Communication: No difficulties   Cognition Arousal/Alertness: Awake/alert Behavior During Therapy: WFL for tasks assessed/performed Overall Cognitive Status: Within Functional Limits for tasks assessed                     General Comments       Exercises       Shoulder Instructions      Home  Living Family/patient expects to be discharged to:: Private residence Living Arrangements: Spouse/significant other Available Help at Discharge: Family;Available 24 hours/day Type of Home: House Home Access: Stairs to enter [employer] of Steps: 1   Home Layout: One level     Bathroom Shower/Tub: Occupational psychologist: Handicapped height     Home Equipment: Environmental consultant - 2 wheels;Shower seat - built in;Grab bars - tub/shower;Hand  held shower head          Prior Functioning/Environment Level of Independence: Independent                 OT Problem List: Impaired balance (sitting and/or standing);Decreased knowledge of use of DME or AE;Pain   OT Treatment/Interventions:      OT Goals(Current goals can be found in the care plan section) Acute Rehab OT Goals Patient Stated Goal: to go home  OT Frequency:     Barriers to D/C:            Co-evaluation              End of Session Equipment Utilized During Treatment: Gait belt;Rolling walker  Activity Tolerance: Patient tolerated treatment well Patient left: in chair;with call bell/phone within reach;with family/visitor present   Time: 1120-1135 OT Time Calculation (min): 15 min Charges:  OT General Charges $OT Visit: 1 Procedure OT Evaluation $OT Eval Moderate Complexity: 1 Procedure G-Codes:    Malka So 01/07/2017, 12:55 PM  [phone]

## 2017-01-08 LAB — CBC
HCT: 32.8 % — ABNORMAL LOW (ref 39.0–52.0)
HEMOGLOBIN: 11.1 g/dL — AB (ref 13.0–17.0)
MCH: 32.4 pg (ref 26.0–34.0)
MCHC: 33.8 g/dL (ref 30.0–36.0)
MCV: 95.6 fL (ref 78.0–100.0)
Platelets: 157 10*3/uL (ref 150–400)
RBC: 3.43 MIL/uL — ABNORMAL LOW (ref 4.22–5.81)
RDW: 14.5 % (ref 11.5–15.5)
WBC: 15.7 10*3/uL — ABNORMAL HIGH (ref 4.0–10.5)

## 2017-01-08 NOTE — Care Management Note (Signed)
Case Management Note  Patient Details  Name: [name] MRN: [id number] Date of Birth: 04/10/1946  Subjective/Objective:  Right TKA                  Action/Plan: Discharge Planning: AVS reviewed: NCM spoke to pt and has RW and 3n1 at home. Scheduled for outpt PT on Tuesday at [facility]. Wife will assist at home as needed.   PCP [name] MD  Expected Discharge Date:  01/08/17               Expected Discharge Plan:  Home/Self Care  In-House Referral:  NA  Discharge planning Services  CM Consult  Post Acute Care Choice:  NA Choice offered to:  NA  DME Arranged:  N/A DME Agency:  NA  HH Arranged:  NA HH Agency:  NA  Status of Service:  Completed, signed off  If discussed at [place] of Stay Meetings, dates discussed:    Additional Comments:  Erenest Rasher, RN 01/08/2017, 9:19 AM

## 2017-01-08 NOTE — Progress Notes (Signed)
Physical Therapy Treatment Patient Details Name: [name] MRN: [id number] DOB: 10-02-1946 Today's Date: 01/08/2017    History of Present Illness r tka    PT Comments    POD # 2 am session This is pt's second TKR so very knowledgeable.   Assisted OOb to amb in hallway, practiced one step then performed all supine TKR TE's following HEP handout given.  Instructed on proper tech, freq as well as use of ICE.  Pt progressing well with minimal pain/swelling. Ready for D/C to home.  Follow Up Recommendations  Outpatient PT (starts Tuesday per pt)     Equipment Recommendations  None recommended by PT    Recommendations for Other Services       Precautions / Restrictions Precautions Precautions: Fall;Knee Precaution Comments: did not require KI Restrictions Weight Bearing Restrictions: No    Mobility  Bed Mobility Overal bed mobility: Modified Independent                Transfers Overall transfer level: Needs assistance Equipment used: Rolling walker (2 wheeled) Transfers: Sit to/from [facility] Sit to Stand: Supervision Stand pivot transfers: Supervision       General transfer comment: one VC on safety with turns  Ambulation/Gait Ambulation/Gait assistance: Supervision Ambulation Distance (Feet): 145 Feet Assistive device: Rolling walker (2 wheeled) Gait Pattern/deviations: Step-to pattern;Step-through pattern;Antalgic     General Gait Details: one VC on safety with turns.     Stairs Stairs: Yes   Stair Management: No rails;Step to pattern;Forwards;With walker Number of Stairs: 1 General stair comments: one step performed with one VC on proper walker placement.    Wheelchair Mobility    Modified Rankin (Stroke Patients Only)       Balance                                    Cognition Arousal/Alertness: Awake/alert Behavior During Therapy: WFL for tasks assessed/performed Overall Cognitive Status: Within  Functional Limits for tasks assessed                      Exercises   Total Knee Replacement TE's 10 reps B LE ankle pumps 10 reps towel squeezes 10 reps knee presses 10 reps heel slides  10 reps SAQ's 10 reps SLR's 10 reps ABD Followed by ICE     General Comments        Pertinent Vitals/Pain Pain Assessment: 0-10 Pain Score: 3  Pain Location: R knee Pain Descriptors / Indicators: Operative site guarding;Discomfort Pain Intervention(s): Monitored during session;Repositioned;Ice applied    Home Living                      Prior Function            PT Goals (current goals can now be found in the care plan section) Progress towards PT goals: Progressing toward goals    Frequency    7X/week      PT Plan Current plan remains appropriate    Co-evaluation             End of Session Equipment Utilized During Treatment: Gait belt Activity Tolerance: Patient tolerated treatment well Patient left: in bed;with call bell/phone within reach     Time: 0830-0900 PT Time Calculation (min) (ACUTE ONLY): 30 min  Charges:  $Gait Training: 8-22 mins $Therapeutic Exercise: 8-22 mins  G Codes:      Rica Koyanagi  PTA [facility]  Acute  Rehab Pager      [phone]

## 2017-01-08 NOTE — Progress Notes (Signed)
   Subjective: 2 Days Post-Op Procedure(s) (LRB): RIGHT TOTAL KNEE ARTHROPLASTY (Right) Patient reports pain as mild.   Patient seen in rounds with Dr. Wynelle Link.  Feels much better today. Patient is well, but has had some minor complaints of pain in the knee, requiring pain medications We will resume therapy today.  Plan is to go Home after hospital stay.  Objective: Vital signs in last 24 hours: Temp:  [98.3 F (36.8 C)-98.6 F (37 C)] 98.4 F (36.9 C) (01/21 0553) Pulse Rate:  [61-73] 73 (01/21 0553) Resp:  [16-20] 20 (01/21 0553) BP: (113-128)/(50-60) 128/60 (01/21 0553) SpO2:  [96 %-98 %] 98 % (01/21 0553)  Intake/Output from previous day:  Intake/Output Summary (Last 24 hours) at 01/08/17 0737 Last data filed at 01/08/17 0558  Gross per 24 hour  Intake              480 ml  Output             4025 ml  Net            -3545 ml    Intake/Output this shift: No intake/output data recorded.  Labs:  Recent Labs  01/07/17 0441 01/08/17 0438  HGB 13.2 11.1*    Recent Labs  01/07/17 0441 01/08/17 0438  WBC 16.1* 15.7*  RBC 4.01* 3.43*  HCT 38.2* 32.8*  PLT 173 157    Recent Labs  01/07/17 0441  NA 135  K 4.8  CL 99*  CO2 27  BUN 20  CREATININE 0.84  GLUCOSE 145*  CALCIUM 9.1   No results for input(s): LABPT, INR in the last 72 hours.  EXAM General - Patient is Alert, Appropriate and Oriented Extremity - Neurovascular intact Sensation intact distally Dorsiflexion/Plantar flexion intact Dressing - dressing C/D/I Motor Function - intact, moving foot and toes well on exam.  Hemovac pulled without difficulty.  Past Medical History:  Diagnosis Date  . Arthritis   . Complication of anesthesia    FUSion of SKULL thru C7- patient has no movement of head or neck - stiffness from fusion  . Diplopia 07/10/2013  . Diverticulosis of colon (without mention of hemorrhage) 2009   Colonoscopy  . Dyslipidemia   . Dysrhythmia    PAF- not frequently  . HTN  (hypertension)   . Hx of colonic polyps 2009   Colonoscopy(Hyperplastic)  . Obesity   . 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 Paroxysmal atrial flutter (HCC)     Assessment/Plan: 2 Days Post-Op Procedure(s) (LRB): RIGHT TOTAL KNEE ARTHROPLASTY (Right) Active Problems:   Primary osteoarthritis of right knee   S/P knee replacement  Estimated body mass index is 33.89 kg/m as calculated from the following:   Height as of this encounter: 5\' 11"  (1.803 m).   Weight as of this encounter: 110.2 kg (243 lb). Discharge home after therapy  DVT Prophylaxis - Lovenox Weight-Bearing as tolerated to right leg  Arlee Muslim, PA-C Orthopaedic Surgery 01/08/2017, 7:37 AM

## 2017-01-09 NOTE — Addendum Note (Signed)
Addendum  created 01/09/17 0901 by Lollie Sails, CRNA   Charge Capture section accepted

## 2017-01-10 DIAGNOSIS — M25661 Stiffness of right knee, not elsewhere classified: Secondary | ICD-10-CM | POA: Diagnosis not present

## 2017-01-12 DIAGNOSIS — M25661 Stiffness of right knee, not elsewhere classified: Secondary | ICD-10-CM | POA: Diagnosis not present

## 2017-01-13 DIAGNOSIS — Z471 Aftercare following joint replacement surgery: Secondary | ICD-10-CM | POA: Diagnosis not present

## 2017-01-13 DIAGNOSIS — Z96651 Presence of right artificial knee joint: Secondary | ICD-10-CM | POA: Diagnosis not present

## 2017-01-17 DIAGNOSIS — M25661 Stiffness of right knee, not elsewhere classified: Secondary | ICD-10-CM | POA: Diagnosis not present

## 2017-01-19 DIAGNOSIS — M25661 Stiffness of right knee, not elsewhere classified: Secondary | ICD-10-CM | POA: Diagnosis not present

## 2017-01-20 DIAGNOSIS — Z96651 Presence of right artificial knee joint: Secondary | ICD-10-CM | POA: Diagnosis not present

## 2017-01-20 DIAGNOSIS — Z471 Aftercare following joint replacement surgery: Secondary | ICD-10-CM | POA: Diagnosis not present

## 2017-01-23 DIAGNOSIS — M25661 Stiffness of right knee, not elsewhere classified: Secondary | ICD-10-CM | POA: Diagnosis not present

## 2017-01-25 NOTE — Discharge Summary (Signed)
Physician Discharge Summary   Patient ID: SUNDIATA FERRICK MRN: [id number] DOB/AGE: 1946-05-25 71 y.o.  Admit date: 01/06/2017 Discharge date: 01/08/2017  Primary Diagnosis:  right knee osteoarthritis  Admission Diagnoses:  Past Medical History:  Diagnosis Date  . Arthritis   . Complication of anesthesia    FUSion of SKULL thru C7- patient has no movement of head or neck - stiffness from fusion  . Diplopia 07/10/2013  . Diverticulosis of colon (without mention of hemorrhage) 2009   Colonoscopy  . Dyslipidemia   . Dysrhythmia    PAF- not frequently  . HTN (hypertension)   . Hx of colonic polyps 2009   Colonoscopy(Hyperplastic)  . Obesity   . 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 Paroxysmal atrial flutter [facility])    Discharge Diagnoses:   Active Problems:   Primary osteoarthritis of right knee   S/P knee replacement  Estimated body mass index is 33.89 kg/m as calculated from the following:   Height as of this encounter: 5' 11"  (1.803 m).   Weight as of this encounter: 110.2 kg (243 lb).  Procedure:  Procedure(s) (LRB): RIGHT TOTAL KNEE ARTHROPLASTY (Right)   Consults: None  HPI: JEWETT MCGANN, 71 y.o. male, has a history of pain and functional disability in the right knee due to arthritis and has failed non-surgical conservative treatments for greater than 12 weeks to includeNSAID's and/or analgesics, viscosupplementation injections, flexibility and strengthening excercises, weight reduction as appropriate and activity modification.  Onset of symptoms was gradual, starting 3 years ago with gradually worsening course since that time. The patient noted no past surgery on the right knee(s).  Patient currently rates pain in the right knee(s) at 8 out of 10 with activity. Patient has pain that interferes with activities of daily living, pain with passive range of motion and joint swelling.  Patient has evidence of subchondral sclerosis, periarticular osteophytes and joint space  narrowing by imaging studies. There is no active infection.  Laboratory Data: Admission on 01/06/2017, Discharged on 01/08/2017  Component Date Value Ref Range Status  . WBC 01/07/2017 16.1* 4.0 - 10.5 K/uL Final  . RBC 01/07/2017 4.01* 4.22 - 5.81 MIL/uL Final  . Hemoglobin 01/07/2017 13.2  13.0 - 17.0 g/dL Final  . HCT 01/07/2017 38.2* 39.0 - 52.0 % Final  . MCV 01/07/2017 95.3  78.0 - 100.0 fL Final  . MCH 01/07/2017 32.9  26.0 - 34.0 pg Final  . MCHC 01/07/2017 34.6  30.0 - 36.0 g/dL Final  . RDW 01/07/2017 14.1  11.5 - 15.5 % Final  . Platelets 01/07/2017 173  150 - 400 K/uL Final  . Sodium 01/07/2017 135  135 - 145 mmol/L Final  . Potassium 01/07/2017 4.8  3.5 - 5.1 mmol/L Final  . Chloride 01/07/2017 99* 101 - 111 mmol/L Final  . CO2 01/07/2017 27  22 - 32 mmol/L Final  . Glucose, Bld 01/07/2017 145* 65 - 99 mg/dL Final  . BUN 01/07/2017 20  6 - 20 mg/dL Final  . Creatinine, Ser 01/07/2017 0.84  0.61 - 1.24 mg/dL Final  . Calcium 01/07/2017 9.1  8.9 - 10.3 mg/dL Final  . GFR calc non Af Amer 01/07/2017 >60  >60 mL/min Final  . GFR calc Af Amer 01/07/2017 >60  >60 mL/min Final   Comment: (NOTE) The eGFR has been calculated using the CKD EPI equation. This calculation has not been validated in all clinical situations. eGFR's persistently <60 mL/min signify possible Chronic Kidney Disease.   Georgiann Hahn gap 01/07/2017  9  5 - 15 Final  . WBC 01/08/2017 15.7* 4.0 - 10.5 K/uL Final  . RBC 01/08/2017 3.43* 4.22 - 5.81 MIL/uL Final  . Hemoglobin 01/08/2017 11.1* 13.0 - 17.0 g/dL Final  . HCT 01/08/2017 32.8* 39.0 - 52.0 % Final  . MCV 01/08/2017 95.6  78.0 - 100.0 fL Final  . MCH 01/08/2017 32.4  26.0 - 34.0 pg Final  . MCHC 01/08/2017 33.8  30.0 - 36.0 g/dL Final  . RDW 01/08/2017 14.5  11.5 - 15.5 % Final  . Platelets 01/08/2017 157  150 - 400 K/uL Final  Hospital Outpatient Visit on 12/30/2016  Component Date Value Ref Range Status  . aPTT 12/30/2016 25  24 - 36 seconds Final    . Sodium 12/30/2016 135  135 - 145 mmol/L Final  . Potassium 12/30/2016 4.4  3.5 - 5.1 mmol/L Final  . Chloride 12/30/2016 101  101 - 111 mmol/L Final  . CO2 12/30/2016 26  22 - 32 mmol/L Final  . Glucose, Bld 12/30/2016 103* 65 - 99 mg/dL Final  . BUN 12/30/2016 29* 6 - 20 mg/dL Final  . Creatinine, Ser 12/30/2016 0.96  0.61 - 1.24 mg/dL Final  . Calcium 12/30/2016 9.5  8.9 - 10.3 mg/dL Final  . GFR calc non Af Amer 12/30/2016 >60  >60 mL/min Final  . GFR calc Af Amer 12/30/2016 >60  >60 mL/min Final   Comment: (NOTE) The eGFR has been calculated using the CKD EPI equation. This calculation has not been validated in all clinical situations. eGFR's persistently <60 mL/min signify possible Chronic Kidney Disease.   . Anion gap 12/30/2016 8  5 - 15 Final  . WBC 12/30/2016 8.2  4.0 - 10.5 K/uL Final  . RBC 12/30/2016 4.49  4.22 - 5.81 MIL/uL Final  . Hemoglobin 12/30/2016 14.5  13.0 - 17.0 g/dL Final  . HCT 12/30/2016 42.9  39.0 - 52.0 % Final  . MCV 12/30/2016 95.5  78.0 - 100.0 fL Final  . MCH 12/30/2016 32.3  26.0 - 34.0 pg Final  . MCHC 12/30/2016 33.8  30.0 - 36.0 g/dL Final  . RDW 12/30/2016 14.2  11.5 - 15.5 % Final  . Platelets 12/30/2016 170  150 - 400 K/uL Final  . Prothrombin Time 12/30/2016 12.4  11.4 - 15.2 seconds Final  . INR 12/30/2016 0.93   Final  . ABO/RH(D) 12/30/2016 O NEG   Final  . Antibody Screen 12/30/2016 NEG   Final  . Sample Expiration 12/30/2016 01/09/2017   Final  . Extend sample reason 12/30/2016 NO TRANSFUSIONS OR PREGNANCY IN THE PAST 3 MONTHS   Final  . MRSA, PCR 12/30/2016 NEGATIVE  NEGATIVE Final  . Staphylococcus aureus 12/30/2016 NEGATIVE  NEGATIVE Final   Comment:        The Xpert SA Assay (FDA approved for NASAL specimens in patients over 49 years of age), is one component of a comprehensive surveillance program.  Test performance has been validated by [facility] for patients greater than or equal to 54 year old. It is not  intended to diagnose infection nor to guide or monitor treatment.   . Color, Urine 12/30/2016 AMBER* YELLOW Final  . APPearance 12/30/2016 CLOUDY* CLEAR Final  . Specific Gravity, Urine 12/30/2016 1.020  1.005 - 1.030 Final  . pH 12/30/2016 5.0  5.0 - 8.0 Final  . Glucose, UA 12/30/2016 NEGATIVE  NEGATIVE mg/dL Final  . Hgb urine dipstick 12/30/2016 NEGATIVE  NEGATIVE Final  . Bilirubin Urine 12/30/2016 NEGATIVE  NEGATIVE  Final  . Ketones, ur 12/30/2016 NEGATIVE  NEGATIVE mg/dL Final  . Protein, ur 12/30/2016 NEGATIVE  NEGATIVE mg/dL Final  . Nitrite 12/30/2016 NEGATIVE  NEGATIVE Final  . Leukocytes, UA 12/30/2016 NEGATIVE  NEGATIVE Final  . RBC / HPF 12/30/2016 0-5  0 - 5 RBC/hpf Final  . WBC, UA 12/30/2016 0-5  0 - 5 WBC/hpf Final  . Bacteria, UA 12/30/2016 NONE SEEN  NONE SEEN Final  . Squamous Epithelial / LPF 12/30/2016 NONE SEEN  NONE SEEN Final  . Mucous 12/30/2016 PRESENT   Final     X-Rays:No results found.  EKG: Orders placed or performed during the hospital encounter of 06/10/16  . ED EKG within 10 minutes  . ED EKG within 10 minutes  . EKG     Hospital Course: TEDD COTTRILL is a 71 y.o. who was admitted to [facility], [place]. They were brought to the operating room on 01/06/2017 and underwent Procedure(s): RIGHT TOTAL KNEE ARTHROPLASTY.  Patient tolerated the procedure well and was later transferred to the recovery room and then to the orthopaedic floor for postoperative care.  They were given PO and IV analgesics for pain control following their surgery.  They were given 24 hours of postoperative antibiotics of  Anti-infectives    Start     Dose/Rate Route Frequency Ordered Stop   01/06/17 1700  ceFAZolin (ANCEF) IVPB 2g/100 mL premix     2 g 200 mL/hr over 30 Minutes Intravenous Every 6 hours 01/06/17 1404 01/06/17 2304   01/06/17 0801  ceFAZolin (ANCEF) IVPB 2g/100 mL premix     2 g 200 mL/hr over 30 Minutes Intravenous On call to O.R. 01/06/17 0801 01/06/17  1050     and started on DVT prophylaxis in the form of Lovenox.   PT and OT were ordered for total joint protocol.  Discharge planning consulted to help with postop disposition and equipment needs.  Patient had a tough night on the evening of surgery.  They started to get up OOB with therapy on day one. Hemovac drain was pulled without difficulty.  Continued to work with therapy into day two.  Dressing was checked and was clean and dry. Patient was seen in rounds on POD 2 and was ready to go home.    Discharge Instructions    Call MD / Call 911    Complete by:  As directed    If you experience chest pain or shortness of breath, CALL 911 and be transported to the hospital emergency room.  If you develope a fever above 101 F, pus (white drainage) or increased drainage or redness at the wound, or calf pain, call your surgeon's office.   Constipation Prevention    Complete by:  As directed    Drink plenty of fluids.  Prune juice may be helpful.  You may use a stool softener, such as Colace (over the counter) 100 mg twice a day.  Use MiraLax (over the counter) for constipation as needed.   Diet - low sodium heart healthy    Complete by:  As directed    Discharge instructions    Complete by:  As directed    INSTRUCTIONS AFTER JOINT REPLACEMENT   Remove items at home which could result in a fall. This includes throw rugs or furniture in walking pathways ICE to the affected joint every three hours while awake for 30 minutes at a time, for at least the first 3-5 days, and then as needed for pain and swelling.  Continue to use ice for pain and swelling. You may notice swelling that will progress down to the foot and ankle.  This is normal after surgery.  Elevate your leg when you are not up walking on it.   Continue to use the breathing machine you got in the hospital (incentive spirometer) which will help keep your temperature down.  It is common for your temperature to cycle up and down following  surgery, especially at night when you are not up moving around and exerting yourself.  The breathing machine keeps your lungs expanded and your temperature down.   DIET:  As you were doing prior to hospitalization, we recommend a well-balanced diet.  DRESSING / WOUND CARE / SHOWERING  Keep the surgical dressing until follow up.  The dressing is water proof, so you can shower without any extra covering.  IF THE DRESSING FALLS OFF or the wound gets wet inside, change the dressing with sterile gauze.  Please use good hand washing techniques before changing the dressing.  Do not use any lotions or creams on the incision until instructed by your surgeon.    ACTIVITY  Increase activity slowly as tolerated, but follow the weight bearing instructions below.   No driving for 6 weeks or until further direction given by your physician.  You cannot drive while taking narcotics.  No lifting or carrying greater than 10 lbs. until further directed by your surgeon. Avoid periods of inactivity such as sitting longer than an hour when not asleep. This helps prevent blood clots.  You may return to work once you are authorized by your doctor.     WEIGHT BEARING   Weight bearing as tolerated with assist device (walker, cane, etc) as directed, use it as long as suggested by your surgeon or therapist, typically at least 4-6 weeks.   EXERCISES  Results after joint replacement surgery are often greatly improved when you follow the exercise, range of motion and muscle strengthening exercises prescribed by your doctor. Safety measures are also important to protect the joint from further injury. Any time any of these exercises cause you to have increased pain or swelling, decrease what you are doing until you are comfortable again and then slowly increase them. If you have problems or questions, call your caregiver or physical therapist for advice.   Rehabilitation is important following a joint replacement. After  just a few days of immobilization, the muscles of the leg can become weakened and shrink (atrophy).  These exercises are designed to build up the tone and strength of the thigh and leg muscles and to improve motion. Often times heat used for twenty to thirty minutes before working out will loosen up your tissues and help with improving the range of motion but do not use heat for the first two weeks following surgery (sometimes heat can increase post-operative swelling).   These exercises can be done on a training (exercise) mat, on the floor, on a table or on a bed. Use whatever works the best and is most comfortable for you.    Use music or television while you are exercising so that the exercises are a pleasant break in your day. This will make your life better with the exercises acting as a break in your routine that you can look forward to.   Perform all exercises about fifteen times, three times per day or as directed.  You should exercise both the operative leg and the other leg as well.   Exercises include:  Quad Sets - Tighten up the muscle on the front of the thigh (Quad) and hold for 5-10 seconds.   Straight Leg Raises - With your knee straight (if you were given a brace, keep it on), lift the leg to 60 degrees, hold for 3 seconds, and slowly lower the leg.  Perform this exercise against resistance later as your leg gets stronger.  Leg Slides: Lying on your back, slowly slide your foot toward your buttocks, bending your knee up off the floor (only go as far as is comfortable). Then slowly slide your foot back down until your leg is flat on the floor again.  Angel Wings: Lying on your back spread your legs to the side as far apart as you can without causing discomfort.  Hamstring Strength:  Lying on your back, push your heel against the floor with your leg straight by tightening up the muscles of your buttocks.  Repeat, but this time bend your knee to a comfortable angle, and push your heel  against the floor.  You may put a pillow under the heel to make it more comfortable if necessary.   A rehabilitation program following joint replacement surgery can speed recovery and prevent re-injury in the future due to weakened muscles. Contact your doctor or a physical therapist for more information on knee rehabilitation.    CONSTIPATION  Constipation is defined medically as fewer than three stools per week and severe constipation as less than one stool per week.  Even if you have a regular bowel pattern at home, your normal regimen is likely to be disrupted due to multiple reasons following surgery.  Combination of anesthesia, postoperative narcotics, change in appetite and fluid intake all can affect your bowels.   YOU MUST use at least one of the following options; they are listed in order of increasing strength to get the job done.  They are all available over the counter, and you may need to use some, POSSIBLY even all of these options:    Drink plenty of fluids (prune juice may be helpful) and high fiber foods Colace 100 mg by mouth twice a day  Senokot for constipation as directed and as needed Dulcolax (bisacodyl), take with full glass of water  Miralax (polyethylene glycol) once or twice a day as needed.  If you have tried all these things and are unable to have a bowel movement in the first 3-4 days after surgery call either your surgeon or your primary doctor.    If you experience loose stools or diarrhea, hold the medications until you stool forms back up.  If your symptoms do not get better within 1 week or if they get worse, check with your doctor.  If you experience "the worst abdominal pain ever" or develop nausea or vomiting, please contact the office immediately for further recommendations for treatment.   ITCHING:  If you experience itching with your medications, try taking only a single pain pill, or even half a pain pill at a time.  You can also use Benadryl over the  counter for itching or also to help with sleep.   TED HOSE STOCKINGS:  Use stockings on both legs until for at least 2 weeks or as directed by physician office. They may be removed at night for sleeping.  MEDICATIONS:  See your medication summary on the "After Visit Summary" that nursing will review with you.  You may have some home medications which will be placed on hold until you complete the course  of blood thinner medication.  It is important for you to complete the blood thinner medication as prescribed.  PRECAUTIONS:  If you experience chest pain or shortness of breath - call 911 immediately for transfer to the hospital emergency department.   If you develop a fever greater that 101 F, purulent drainage from wound, increased redness or drainage from wound, foul odor from the wound/dressing, or calf pain - CONTACT YOUR SURGEON.                                                   FOLLOW-UP APPOINTMENTS:  If you do not already have a post-op appointment, please call the office for an appointment to be seen by your surgeon.  Guidelines for how soon to be seen are listed in your "After Visit Summary", but are typically between 1-4 weeks after surgery.  OTHER INSTRUCTIONS:   Knee Replacement:  Do not place pillow under knee, focus on keeping the knee straight while resting. CPM instructions: 0-90 degrees, 2 hours in the morning, 2 hours in the afternoon, and 2 hours in the evening. Place foam block, curve side up under heel at all times except when in CPM or when walking.  DO NOT modify, tear, cut, or change the foam block in any way.  MAKE SURE YOU:  Understand these instructions.  Get help right away if you are not doing well or get worse.    Thank you for letting us be a part of your medical care team.  It is a privilege we respect greatly.  We hope these instructions will help you stay on track for a fast and full recovery!   Increase activity slowly as tolerated    Complete by:  As  directed      Allergies as of 01/08/2017      Reactions   Niacin Diarrhea   REACTION: causes diarreha      Medication List    STOP taking these medications   HYDROcodone-acetaminophen 5-325 MG tablet Commonly known as:  NORCO/VICODIN     TAKE these medications   amLODipine 5 MG tablet Commonly known as:  NORVASC TAKE ONE (1) TABLET BY MOUTH TWO (2) TIMES DAILY   aspirin EC 325 MG tablet Take 1 tablet (325 mg total) by mouth 2 (two) times daily. What changed:  medication strength  how much to take  when to take this   baclofen 10 MG tablet Commonly known as:  LIORESAL Take 1 tablet (10 mg total) by mouth every 8 (eight) hours as needed for muscle spasms.   benazepril 20 MG tablet Commonly known as:  LOTENSIN TAKE 1 TABLET BY MOUTH TWICE DAILY   Fish Oil 1000 MG Caps Take 1,000 mg by mouth daily.   glucosamine-chondroitin 500-400 MG tablet Take 1 tablet by mouth daily.   hydrochlorothiazide 12.5 MG capsule Commonly known as:  MICROZIDE Take 12.5 mg by mouth daily.   lidocaine 5 % ointment Commonly known as:  XYLOCAINE Apply 1 application topically 3 (three) times daily as needed. For pain   metoprolol tartrate 25 MG tablet Commonly known as:  LOPRESSOR Take 1 tablet (25 mg total) by mouth 2 (two) times daily. TAKE AS NEEDED FOR ATRIAL FLUTTER What changed:  when to take this  reasons to take this  additional instructions   multivitamin tablet Take 1 tablet by  mouth daily.   oxyCODONE 5 MG immediate release tablet Commonly known as:  ROXICODONE Take 1-2 tablets (5-10 mg total) by mouth every 4 (four) hours as needed for severe pain or breakthrough pain.   oxyCODONE-acetaminophen 10-325 MG tablet Commonly known as:  PERCOCET Take 1 tablet by mouth every 6 (six) hours as needed for pain.   vitamin B-12 500 MCG tablet Commonly known as:  CYANOCOBALAMIN Take 500 mcg by mouth daily.   vitamin C 500 MG tablet Commonly known as:  ASCORBIC  ACID Take 500 mg by mouth daily.        Signed: Arlee Muslim, PA-C Orthopaedic Surgery 01/25/2017, 5:27 PM

## 2017-01-26 DIAGNOSIS — M25661 Stiffness of right knee, not elsewhere classified: Secondary | ICD-10-CM | POA: Diagnosis not present

## 2017-01-31 DIAGNOSIS — M25661 Stiffness of right knee, not elsewhere classified: Secondary | ICD-10-CM | POA: Diagnosis not present

## 2017-02-02 DIAGNOSIS — M25661 Stiffness of right knee, not elsewhere classified: Secondary | ICD-10-CM | POA: Diagnosis not present

## 2017-02-03 DIAGNOSIS — N419 Inflammatory disease of prostate, unspecified: Secondary | ICD-10-CM | POA: Diagnosis not present

## 2017-02-03 DIAGNOSIS — R300 Dysuria: Secondary | ICD-10-CM | POA: Diagnosis not present

## 2017-02-06 DIAGNOSIS — Z471 Aftercare following joint replacement surgery: Secondary | ICD-10-CM | POA: Diagnosis not present

## 2017-02-06 DIAGNOSIS — Z96651 Presence of right artificial knee joint: Secondary | ICD-10-CM | POA: Diagnosis not present

## 2017-02-14 DIAGNOSIS — M25661 Stiffness of right knee, not elsewhere classified: Secondary | ICD-10-CM | POA: Diagnosis not present

## 2017-02-16 DIAGNOSIS — M25661 Stiffness of right knee, not elsewhere classified: Secondary | ICD-10-CM | POA: Diagnosis not present

## 2017-02-21 DIAGNOSIS — M25661 Stiffness of right knee, not elsewhere classified: Secondary | ICD-10-CM | POA: Diagnosis not present

## 2017-02-23 DIAGNOSIS — M25661 Stiffness of right knee, not elsewhere classified: Secondary | ICD-10-CM | POA: Diagnosis not present

## 2017-02-28 DIAGNOSIS — M25661 Stiffness of right knee, not elsewhere classified: Secondary | ICD-10-CM | POA: Diagnosis not present

## 2017-03-02 DIAGNOSIS — M25661 Stiffness of right knee, not elsewhere classified: Secondary | ICD-10-CM | POA: Diagnosis not present

## 2017-03-07 DIAGNOSIS — M25661 Stiffness of right knee, not elsewhere classified: Secondary | ICD-10-CM | POA: Diagnosis not present

## 2017-03-13 ENCOUNTER — Other Ambulatory Visit: Payer: Self-pay | Admitting: Neurosurgery

## 2017-03-13 DIAGNOSIS — M7138 Other bursal cyst, other site: Secondary | ICD-10-CM

## 2017-03-14 DIAGNOSIS — M25661 Stiffness of right knee, not elsewhere classified: Secondary | ICD-10-CM | POA: Diagnosis not present

## 2017-03-15 DIAGNOSIS — M7061 Trochanteric bursitis, right hip: Secondary | ICD-10-CM | POA: Diagnosis not present

## 2017-03-15 DIAGNOSIS — Z471 Aftercare following joint replacement surgery: Secondary | ICD-10-CM | POA: Diagnosis not present

## 2017-03-15 DIAGNOSIS — Z96651 Presence of right artificial knee joint: Secondary | ICD-10-CM | POA: Diagnosis not present

## 2017-03-20 DIAGNOSIS — L821 Other seborrheic keratosis: Secondary | ICD-10-CM | POA: Diagnosis not present

## 2017-03-20 DIAGNOSIS — D235 Other benign neoplasm of skin of trunk: Secondary | ICD-10-CM | POA: Diagnosis not present

## 2017-03-20 DIAGNOSIS — Z85820 Personal history of malignant melanoma of skin: Secondary | ICD-10-CM | POA: Diagnosis not present

## 2017-03-23 ENCOUNTER — Ambulatory Visit
Admission: RE | Admit: 2017-03-23 | Discharge: 2017-03-23 | Disposition: A | Discharge status: Home / Self Care | Payer: Medicare Other | Source: Ambulatory Visit | Attending: Neurosurgery | Admitting: Neurosurgery

## 2017-03-23 DIAGNOSIS — M7138 Other bursal cyst, other site: Secondary | ICD-10-CM

## 2017-03-23 DIAGNOSIS — M48061 Spinal stenosis, lumbar region without neurogenic claudication: Secondary | ICD-10-CM | POA: Diagnosis not present

## 2017-03-24 DIAGNOSIS — N411 Chronic prostatitis: Secondary | ICD-10-CM | POA: Diagnosis not present

## 2017-03-24 DIAGNOSIS — M47816 Spondylosis without myelopathy or radiculopathy, lumbar region: Secondary | ICD-10-CM | POA: Diagnosis not present

## 2017-03-24 DIAGNOSIS — M7138 Other bursal cyst, other site: Secondary | ICD-10-CM | POA: Diagnosis not present

## 2017-03-24 DIAGNOSIS — M4696 Unspecified inflammatory spondylopathy, lumbar region: Secondary | ICD-10-CM | POA: Diagnosis not present

## 2017-03-27 ENCOUNTER — Other Ambulatory Visit: Payer: Self-pay | Admitting: Neurosurgery

## 2017-03-27 DIAGNOSIS — M47816 Spondylosis without myelopathy or radiculopathy, lumbar region: Secondary | ICD-10-CM

## 2017-03-28 DIAGNOSIS — N411 Chronic prostatitis: Secondary | ICD-10-CM | POA: Diagnosis not present

## 2017-03-29 ENCOUNTER — Ambulatory Visit
Admission: RE | Admit: 2017-03-29 | Discharge: 2017-03-29 | Disposition: A | Discharge status: Home / Self Care | Payer: Medicare Other | Source: Ambulatory Visit | Attending: Neurosurgery | Admitting: Neurosurgery

## 2017-03-29 DIAGNOSIS — M47816 Spondylosis without myelopathy or radiculopathy, lumbar region: Secondary | ICD-10-CM

## 2017-03-29 DIAGNOSIS — M545 Low back pain: Secondary | ICD-10-CM | POA: Diagnosis not present

## 2017-03-29 MED ORDER — METHYLPREDNISOLONE ACETATE 40 MG/ML INJ SUSP (RADIOLOG: 120.0000 mg | Freq: Once | INTRAMUSCULAR | Status: DC

## 2017-03-29 MED ORDER — IOPAMIDOL (ISOVUE-M 200) INJECTION 41%: 1.0000 mL | Freq: Once | INTRAMUSCULAR | Status: DC

## 2017-03-29 NOTE — Discharge Instructions (Signed)

## 2017-03-30 DIAGNOSIS — R300 Dysuria: Secondary | ICD-10-CM | POA: Diagnosis not present

## 2017-03-30 DIAGNOSIS — N411 Chronic prostatitis: Secondary | ICD-10-CM | POA: Diagnosis not present

## 2017-04-10 DIAGNOSIS — M47816 Spondylosis without myelopathy or radiculopathy, lumbar region: Secondary | ICD-10-CM | POA: Diagnosis not present

## 2017-04-10 DIAGNOSIS — M4696 Unspecified inflammatory spondylopathy, lumbar region: Secondary | ICD-10-CM | POA: Diagnosis not present

## 2017-04-11 ENCOUNTER — Other Ambulatory Visit: Payer: Self-pay | Admitting: Neurosurgery

## 2017-04-11 DIAGNOSIS — M47816 Spondylosis without myelopathy or radiculopathy, lumbar region: Secondary | ICD-10-CM

## 2017-04-24 DIAGNOSIS — N411 Chronic prostatitis: Secondary | ICD-10-CM | POA: Diagnosis not present

## 2017-04-24 DIAGNOSIS — R300 Dysuria: Secondary | ICD-10-CM | POA: Diagnosis not present

## 2017-05-02 DIAGNOSIS — R300 Dysuria: Secondary | ICD-10-CM | POA: Diagnosis not present

## 2017-05-02 DIAGNOSIS — N411 Chronic prostatitis: Secondary | ICD-10-CM | POA: Diagnosis not present

## 2017-05-19 NOTE — Addendum Note (Signed)
Addendum  created 05/19/17 0945 by Rica Koyanagi, MD   Sign clinical note

## 2017-05-23 ENCOUNTER — Telehealth: Payer: Self-pay | Admitting: *Deleted

## 2017-05-29 NOTE — Telephone Encounter (Signed)
Patient would like to know if he really needs to take HCTZ and if so would like Dr. Harrington Challenger to be in charge of it, as she is for his other BP meds.  He checks BP daily.  I advised to keep a list and bring it with him to appointment.   He is due for follow up in a few months.  I cancelled his recall and scheduled him on 08/10/17.

## 2017-05-29 NOTE — Telephone Encounter (Signed)
Patient reports BP today 134/68.  I advised to continue all same medications.

## 2017-06-13 DIAGNOSIS — N411 Chronic prostatitis: Secondary | ICD-10-CM | POA: Diagnosis not present

## 2017-06-13 DIAGNOSIS — N401 Enlarged prostate with lower urinary tract symptoms: Secondary | ICD-10-CM | POA: Diagnosis not present

## 2017-06-13 DIAGNOSIS — R300 Dysuria: Secondary | ICD-10-CM | POA: Diagnosis not present

## 2017-06-15 ENCOUNTER — Ambulatory Visit
Admission: RE | Admit: 2017-06-15 | Discharge: 2017-06-15 | Disposition: A | Discharge status: Home / Self Care | Payer: Medicare Other | Source: Ambulatory Visit | Attending: Neurosurgery | Admitting: Neurosurgery

## 2017-06-15 DIAGNOSIS — M47816 Spondylosis without myelopathy or radiculopathy, lumbar region: Secondary | ICD-10-CM

## 2017-06-15 DIAGNOSIS — M545 Low back pain: Secondary | ICD-10-CM | POA: Diagnosis not present

## 2017-06-15 MED ORDER — IOPAMIDOL (ISOVUE-M 200) INJECTION 41%
1.0000 mL | Freq: Once | INTRAMUSCULAR | Status: AC
  Administered 2017-06-15: 1 mL via INTRA_ARTICULAR

## 2017-06-15 MED ORDER — METHYLPREDNISOLONE ACETATE 40 MG/ML INJ SUSP (RADIOLOG
120.0000 mg | Freq: Once | INTRAMUSCULAR | Status: AC
  Administered 2017-06-15: 120 mg via INTRA_ARTICULAR

## 2017-08-09 NOTE — Progress Notes (Signed)
Cardiology Office Note   Date:  08/10/2017   ID:  [name], [name] 1946-11-09, MRN [id number]  PCP:  [name], MD  Cardiologist:   [name], MD   F/U of Paroxysmal atrial flutter and HTN     History of Present Illness: [name] is a 71 y.o. male with a history of paroxy atrial flutter, HTN and HL   Echo showed normal LVEF   Holter monitor showed 8.7% PVCs  Freq PACs    I saq the pt in Sept 2017  Pt notes some R arm pain when sitting  At computer  Alos with movement of R arm    No CP  Breathing OK   No signif palpiations   Wt is up  Has had multiple issues with back  May may having back surgery   Outpatient Medications Prior to Visit  Medication Sig Dispense Refill  . amLODipine (NORVASC) 5 MG tablet TAKE ONE (1) TABLET BY MOUTH TWO (2) TIMES DAILY 60 tablet 11  . baclofen (LIORESAL) 10 MG tablet Take 1 tablet (10 mg total) by mouth every 8 (eight) hours as needed for muscle spasms. 50 each 2  . benazepril (LOTENSIN) 20 MG tablet TAKE 1 TABLET BY MOUTH TWICE DAILY 60 tablet 11  . glucosamine-chondroitin 500-400 MG tablet Take 1 tablet by mouth daily.    . hydrochlorothiazide (,MICROZIDE/HYDRODIURIL,) 12.5 MG capsule Take 12.5 mg by mouth daily.      Marland Kitchen lidocaine (XYLOCAINE) 5 % ointment Apply 1 application topically 3 (three) times daily as needed. For pain 35.44 g 1  . Multiple Vitamin (MULTIVITAMIN) tablet Take 1 tablet by mouth daily.    Marland Kitchen oxyCODONE (ROXICODONE) 5 MG immediate release tablet Take 1-2 tablets (5-10 mg total) by mouth every 4 (four) hours as needed for severe pain or breakthrough pain. 60 tablet 0  . oxyCODONE-acetaminophen (PERCOCET) 10-325 MG per tablet Take 1 tablet by mouth every 6 (six) hours as needed for pain.     . vitamin B-12 (CYANOCOBALAMIN) 500 MCG tablet Take 500 mcg by mouth daily.    . vitamin C (ASCORBIC ACID) 500 MG tablet Take 500 mg by mouth daily.    Marland Kitchen aspirin EC 325 MG tablet Take 1 tablet (325 mg total) by mouth 2 (two)  times daily. (Patient not taking: Reported on 08/10/2017) 60 tablet 0  . metoprolol tartrate (LOPRESSOR) 25 MG tablet Take 1 tablet (25 mg total) by mouth 2 (two) times daily. TAKE AS NEEDED FOR ATRIAL FLUTTER (Patient not taking: Reported on 08/10/2017) 60 tablet 0  . Omega-3 Fatty Acids (FISH OIL) 1000 MG CAPS Take 1,000 mg by mouth daily.     No facility-administered medications prior to visit.      Allergies:   Niacin   Past Medical History:  Diagnosis Date  . Arthritis   . Complication of anesthesia    FUSion of SKULL thru C7- patient has no movement of head or neck - stiffness from fusion  . Diplopia 07/10/2013  . Diverticulosis of colon (without mention of hemorrhage) 2009   Colonoscopy  . Dyslipidemia   . Dysrhythmia    PAF- not frequently  . HTN (hypertension)   . Hx of colonic polyps 2009   Colonoscopy(Hyperplastic)  . Obesity   . 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 Paroxysmal atrial flutter [facility])     Past Surgical History:  Procedure Laterality Date  . [place]   2 surgery- skull to C7  .  COLON RESECTION  2007  . COLONOSCOPY W/ POLYPECTOMY    . HEMORRHOID SURGERY N/A 01/13/2016   Procedure: HEMORRHOIDECTOMY AND REMOVAL OF ANAL SKIN TAG;  Surgeon: Coralie Keens, MD;  Location: [place];  Service: General;  Laterality: N/A;  . REPLACEMENT TOTAL KNEE Left 2005   left  . SYNOVIAL CYST EXCISION     lumbar spine  . TONSILLECTOMY    . TOTAL KNEE ARTHROPLASTY Right 01/06/2017   Procedure: RIGHT TOTAL KNEE ARTHROPLASTY;  Surgeon: Sydnee Cabal, MD;  Location: [facility];  Service: Orthopedics;  Laterality: Right;  Adductior Block     Social History:  The patient  reports that he has never smoked. He has never used smokeless tobacco. He reports that he does not drink alcohol or use drugs.   Family History:  The patient's family history includes Coronary artery disease in his father and mother.    ROS:  Please see the history of present illness.  All other systems are reviewed and  Negative to the above problem except as noted.    PHYSICAL EXAM: VS:  BP (!) 142/78   Pulse 71   Ht 5\' 11"  (1.803 m)   Wt 256 lb 6.4 oz (116.3 kg)   BMI 35.76 kg/m   GEN: Well nourished, well developed, in no acute distress  HEENT: normal  Neck: no JVD, carotid bruits, or masses Cardiac: RRR with occasional skip  ; no murmurs, rubs, or gallops,  Tr edema  Respiratory:  clear to auscultation bilaterally, normal work of breathing GI: soft, nontender, nondistended, + BS  No hepatomegaly  MS: no deformity Moving all extremities   Skin: warm and dry, no rash Neuro:  Strength and sensation are intact Psych: euthymic mood, full affect   EKG:  EKG is ordered today.  SR with PACs  71 bpm     Lipid Panel    Component Value Date/Time   CHOL 158 09/05/2016 1029   TRIG 110 09/05/2016 1029   HDL 36 (L) 09/05/2016 1029   CHOLHDL 4.4 09/05/2016 1029   VLDL 22 09/05/2016 1029   LDLCALC 100 09/05/2016 1029      Wt Readings from Last 3 Encounters:  08/10/17 256 lb 6.4 oz (116.3 kg)  01/06/17 243 lb (110.2 kg)  12/30/16 243 lb 12.8 oz (110.6 kg)      ASSESSMENT AND PLAN:  1  paoxysmal atrial flutter  Pt has not had  Any symptomatic recurrences   When the pt had once in past he was very symptomatic  Feels in neck (after all surgeries)  Would follow  2  Bradycardia  No symptoms of dizziness or SOB   3  HTN BP is a little high  Keep working on wt  With trace edema on exam will give RX for prn lasix 40 with 20 KCL  Watch salt  Use prn    Will check CBC, BMET, A1C, PSA and TSH and lipoids     Current medicines are reviewed at length with the patient today.  The patient does not have concerns regarding medicines.  Disposition:   FU with  Me in November    Signed, Terena Bohan, MD  08/10/2017 8:52 AM    [facility] [address], [place], [place]  [address] Phone: [phone]; Fax: [phone]

## 2017-08-10 ENCOUNTER — Encounter: Payer: Self-pay | Admitting: Internal Medicine

## 2017-08-10 ENCOUNTER — Telehealth: Payer: Self-pay | Admitting: Internal Medicine

## 2017-08-10 ENCOUNTER — Ambulatory Visit (INDEPENDENT_AMBULATORY_CARE_PROVIDER_SITE_OTHER): Payer: Medicare Other | Admitting: Internal Medicine

## 2017-08-10 VITALS — BP 142/78 | HR 71 | Ht 71.0 in | Wt 256.4 lb

## 2017-08-10 DIAGNOSIS — Z87438 Personal history of other diseases of male genital organs: Secondary | ICD-10-CM | POA: Diagnosis not present

## 2017-08-10 DIAGNOSIS — R3911 Hesitancy of micturition: Secondary | ICD-10-CM

## 2017-08-10 DIAGNOSIS — R600 Localized edema: Secondary | ICD-10-CM

## 2017-08-10 DIAGNOSIS — E785 Hyperlipidemia, unspecified: Secondary | ICD-10-CM

## 2017-08-10 DIAGNOSIS — I4892 Unspecified atrial flutter: Secondary | ICD-10-CM | POA: Diagnosis not present

## 2017-08-10 DIAGNOSIS — I10 Essential (primary) hypertension: Secondary | ICD-10-CM

## 2017-08-10 LAB — LIPID PANEL
CHOLESTEROL TOTAL: 152 mg/dL (ref 100–199)
Chol/HDL Ratio: 4.9 ratio (ref 0.0–5.0)
HDL: 31 mg/dL — ABNORMAL LOW (ref >39)
LDL Calculated: 105 mg/dL — ABNORMAL HIGH (ref 0–99)
TRIGLYCERIDES: 80 mg/dL (ref 0–149)
VLDL Cholesterol Cal: 16 mg/dL (ref 5–40)

## 2017-08-10 LAB — CBC
HEMATOCRIT: 39.4 % (ref 37.5–51.0)
HEMOGLOBIN: 13.5 g/dL (ref 13.0–17.7)
MCH: 32.8 pg (ref 26.6–33.0)
MCHC: 34.3 g/dL (ref 31.5–35.7)
MCV: 96 fL (ref 79–97)
Platelets: 195 10*3/uL (ref 150–379)
RBC: 4.12 x10E6/uL — ABNORMAL LOW (ref 4.14–5.80)
RDW: 13.4 % (ref 12.3–15.4)
WBC: 5.7 10*3/uL (ref 3.4–10.8)

## 2017-08-10 LAB — TSH: TSH: 1.55 u[IU]/mL (ref 0.450–4.500)

## 2017-08-10 LAB — BASIC METABOLIC PANEL
BUN / CREAT RATIO: 24 (ref 10–24)
BUN: 30 mg/dL — AB (ref 8–27)
CO2: 22 mmol/L (ref 20–29)
CREATININE: 1.27 mg/dL (ref 0.76–1.27)
Calcium: 9.5 mg/dL (ref 8.6–10.2)
Chloride: 100 mmol/L (ref 96–106)
GFR calc Af Amer: 65 mL/min/{1.73_m2} (ref >59)
GFR, EST NON AFRICAN AMERICAN: 56 mL/min/{1.73_m2} — AB (ref >59)
Glucose: 106 mg/dL — ABNORMAL HIGH (ref 65–99)
Potassium: 4.5 mmol/L (ref 3.5–5.2)
SODIUM: 140 mmol/L (ref 134–144)

## 2017-08-10 LAB — PRO B NATRIURETIC PEPTIDE: NT-Pro BNP: 34 pg/mL (ref 0–376)

## 2017-08-10 LAB — PSA

## 2017-08-10 MED ORDER — FUROSEMIDE 40 MG PO TABS: ORAL_TABLET | ORAL | 3 refills | Status: DC

## 2017-08-10 MED ORDER — POTASSIUM CHLORIDE CRYS ER 20 MEQ PO TBCR: 20.0000 meq | EXTENDED_RELEASE_TABLET | Freq: Every day | ORAL | 3 refills | Status: DC

## 2017-08-10 MED ORDER — POTASSIUM CHLORIDE CRYS ER 20 MEQ PO TBCR: EXTENDED_RELEASE_TABLET | ORAL | 3 refills | Status: DC

## 2017-08-10 MED ORDER — FUROSEMIDE 40 MG PO TABS: 40.0000 mg | ORAL_TABLET | Freq: Every day | ORAL | 3 refills | Status: DC

## 2017-08-10 NOTE — Telephone Encounter (Signed)
Reviewed ov note from today and discussed with Dr. Harrington Challenger.  Sent prescriptions for lasix 40 mg and potassium 20 mEq prn to [facility] and called patient and informed.   He will take tomorrow, and then as needed, up to 3 times per week at most.

## 2017-08-10 NOTE — Patient Instructions (Addendum)
Your physician recommends that you continue on your current medications as directed. Please refer to the Current Medication list given to you today.  Your physician recommends that you return for lab work today (BMET, CBC, TSH, LIPIDS, PSA, BNP)   Your physician recommends that you schedule a follow-up appointment around the end of November, 2018 with Dr. Harrington Challenger.

## 2017-08-10 NOTE — Telephone Encounter (Signed)
New message      Pt was seen this am.  His presc for lasix has not been called in.  Just checking to make sure Dr Harrington Challenger wanted him to have this medication.  If yes, please call it in to [facility]

## 2017-08-23 ENCOUNTER — Telehealth: Payer: Self-pay | Admitting: Internal Medicine

## 2017-08-23 DIAGNOSIS — R0609 Other forms of dyspnea: Principal | ICD-10-CM

## 2017-08-23 NOTE — Telephone Encounter (Signed)
New message  Pt c/o Shortness Of Breath: STAT if SOB developed within the last 24 hours or pt is noticeably SOB on the phone  1. Are you currently SOB (can you hear that pt is SOB on the phone)? no  2. How long have you been experiencing SOB? Per pt past week or so  3. Are you SOB when sitting or when up moving around? Moving around doing the simplest activities   4. Are you currently experiencing any other symptoms? no

## 2017-08-23 NOTE — Telephone Encounter (Signed)
Contacted pt   He has been noticing more SOB with activity over past few weeks  Driving back from beach  DOE not at rest  No CP No signif swelling of legs Has been working to lose wt  Down 5 or 6 lbs from frecent visit  I would recomm the pt come in for labs tomorrow at about 11:30 (TSH, BMET, CBC, BNP, ddimer) I will see him around [facility]

## 2017-08-23 NOTE — Telephone Encounter (Signed)
Called patient about his message. Patient complained that the last few weeks he has had SOB with activity. Patient denies chest pain. Patient stated the SOB started when he went on Vacation, and he is on his way home now. Patient stated he feels fine as long has he is just sitting around. Patient has been dieting and states he has lost weight, somewhere between 10 to 15 lbs. Patient also stated that his left leg had some slight edema. Asked patient if he has eaten out while out of town. Patient stated he has been eating out a lot, but has been eating salads and cutting out carbs. Informed patient that restaurant food is usually higher in sodium. Encouraged patient to take his Lasix that he has prescribed PRN for edema, and a message would be sent to Dr. Harrington Challenger for further advisement.

## 2017-08-23 NOTE — Telephone Encounter (Signed)
Called patient with Dr. Harrington Challenger' recommendations. Made an appointment for patient to have lab work tomorrow before an appointment with Dr. Harrington Challenger at 12:00. Order lab work and linked to appointment.

## 2017-08-24 ENCOUNTER — Ambulatory Visit (INDEPENDENT_AMBULATORY_CARE_PROVIDER_SITE_OTHER): Payer: Medicare Other

## 2017-08-24 ENCOUNTER — Encounter: Payer: Self-pay | Admitting: Internal Medicine

## 2017-08-24 ENCOUNTER — Ambulatory Visit (INDEPENDENT_AMBULATORY_CARE_PROVIDER_SITE_OTHER): Payer: Medicare Other | Admitting: Internal Medicine

## 2017-08-24 ENCOUNTER — Other Ambulatory Visit: Payer: Medicare Other | Admitting: *Deleted

## 2017-08-24 VITALS — BP 130/60 | HR 44 | Ht 71.0 in | Wt 250.8 lb

## 2017-08-24 DIAGNOSIS — R002 Palpitations: Secondary | ICD-10-CM

## 2017-08-24 DIAGNOSIS — I480 Paroxysmal atrial fibrillation: Secondary | ICD-10-CM

## 2017-08-24 DIAGNOSIS — R0609 Other forms of dyspnea: Secondary | ICD-10-CM | POA: Diagnosis not present

## 2017-08-24 DIAGNOSIS — I10 Essential (primary) hypertension: Secondary | ICD-10-CM

## 2017-08-24 NOTE — Patient Instructions (Addendum)
Continue current medicines  Your physician has recommended that you wear an event monitor. Event monitors are medical devices that record the heart's electrical activity. Doctors most often [place] these monitors to diagnose arrhythmias. Arrhythmias are problems with the speed or rhythm of the heartbeat. The monitor is a small, portable device. You can wear one while you do your normal daily activities. This is usually used to diagnose what is causing palpitations/syncope (passing out).  Follow up with your physician will depend on test results.

## 2017-08-24 NOTE — Progress Notes (Addendum)
Cardiology Office Note   Date:  08/24/2017   ID:  [name], DOB December 22, 1945, MRN [id number]  PCP:  [name], MD  Cardiologist:   [name], MD   Pt presents for dyspnea eval     History of Present Illness: [name] is a 71 y.o. male with a history of paroxy atrial flutter, HTN and HL   Echo showed normal LVEF   Holter monitor showed 8.7% PVCs  Freq PACs     just saw the pt a few wks ago in clinic   He called the office this week   Says he had noticed more SOB   Was down at beach  On boat helping with docking    Had to stop what he was doing  Has happened other times  IN shower caught him  Had to slow   Not at all times  Intermittent  For example= loading truck he had no problems   He does say that with spells he noticed some heart racing   Not like atrial flutter  Different    No PND   Pt's wt is down form last time  He is continuing to diet  Limting carbs     Outpatient Medications Prior to Visit  Medication Sig Dispense Refill  . amLODipine (NORVASC) 5 MG tablet TAKE ONE (1) TABLET BY MOUTH TWO (2) TIMES DAILY 60 tablet 11  . aspirin EC 81 MG tablet Take 81 mg by mouth daily.    . baclofen (LIORESAL) 10 MG tablet Take 1 tablet (10 mg total) by mouth every 8 (eight) hours as needed for muscle spasms. 50 each 2  . benazepril (LOTENSIN) 20 MG tablet TAKE 1 TABLET BY MOUTH TWICE DAILY 60 tablet 11  . furosemide (LASIX) 40 MG tablet TAKE ONE TABLET BY MOUTH UP TO 3 TIMES PER WEEK AS NEEDED FOR EDEMA 30 tablet 3  . glucosamine-chondroitin 500-400 MG tablet Take 1 tablet by mouth daily.    . hydrochlorothiazide (,MICROZIDE/HYDRODIURIL,) 12.5 MG capsule Take 12.5 mg by mouth daily.      Marland Kitchen lidocaine (XYLOCAINE) 5 % ointment Apply 1 application topically 3 (three) times daily as needed. For pain 35.44 g 1  . metoprolol tartrate (LOPRESSOR) 25 MG tablet Take 25 mg by mouth as needed.    . Multiple Vitamin (MULTIVITAMIN) tablet Take 1 tablet by mouth daily.    Marland Kitchen  oxyCODONE (ROXICODONE) 5 MG immediate release tablet Take 1-2 tablets (5-10 mg total) by mouth every 4 (four) hours as needed for severe pain or breakthrough pain. 60 tablet 0  . oxyCODONE-acetaminophen (PERCOCET) 10-325 MG per tablet Take 1 tablet by mouth every 6 (six) hours as needed for pain.     . potassium chloride SA (K-DUR,KLOR-CON) 20 MEQ tablet TAKE AS DIRECTED.  TAKE ONE TABLET WITH FUROSEMIDE 30 tablet 3  . tamsulosin (FLOMAX) 0.4 MG CAPS capsule Take 0.4 mg by mouth daily.    . vitamin B-12 (CYANOCOBALAMIN) 500 MCG tablet Take 500 mcg by mouth daily.    . vitamin C (ASCORBIC ACID) 500 MG tablet Take 500 mg by mouth daily.     No facility-administered medications prior to visit.      Allergies:   Niacin   Past Medical History:  Diagnosis Date  . Arthritis   . Complication of anesthesia    FUSion of SKULL thru C7- patient has no movement of head or neck - stiffness from fusion  . Diplopia 07/10/2013  . Diverticulosis of  colon (without mention of hemorrhage) 2009   Colonoscopy  . Dyslipidemia   . Dysrhythmia    PAF- not frequently  . HTN (hypertension)   . Hx of colonic polyps 2009   Colonoscopy(Hyperplastic)  . Obesity   . 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 Paroxysmal atrial flutter [facility])     Past Surgical History:  Procedure Laterality Date  . [place]   2 surgery- skull to C7  . COLON RESECTION  2007  . COLONOSCOPY W/ POLYPECTOMY    . HEMORRHOID SURGERY N/A 01/13/2016   Procedure: HEMORRHOIDECTOMY AND REMOVAL OF ANAL SKIN TAG;  Surgeon: Coralie Keens, MD;  Location: [facility];  Service: General;  Laterality: N/A;  . REPLACEMENT TOTAL KNEE Left 2005   left  . SYNOVIAL CYST EXCISION     lumbar spine  . TONSILLECTOMY    . TOTAL KNEE ARTHROPLASTY Right 01/06/2017   Procedure: RIGHT TOTAL KNEE ARTHROPLASTY;  Surgeon: Sydnee Cabal, MD;  Location: [facility];  Service: Orthopedics;  Laterality: Right;  Adductior Block     Social History:   The patient  reports that he has never smoked. He has never used smokeless tobacco. He reports that he does not drink alcohol or use drugs.   Family History:  The patient's family history includes Coronary artery disease in his father and mother.    ROS:  Please see the history of present illness. All other systems are reviewed and  Negative to the above problem except as noted.    PHYSICAL EXAM: VS:  BP 130/60 (BP Location: Left Arm)   Pulse (!) 44   Ht 5\' 11"  (1.803 m)   Wt 250 lb 12.8 oz (113.8 kg)   BMI 34.98 kg/m   Walking around clinic P100  Sats went to low of 93%  No significant symptomas GEN:  Obese  in no acute distress  HEENT: normal  Neck: no JVD, carotid bruits, or masses Cardiac: RRR with occasional skip  ; no murmurs, rubs, or gallops,  Tr edema  Respiratory:  clear to auscultation bilaterally, normal work of breathing GI: soft, nontender, nondistended, + BS  No hepatomegaly  MS: no deformity Moving all extremities   Skin: warm and dry, no rash Neuro:  Strength and sensation are intact Psych: euthymic mood, full affect   EKG:  EKG is ordered today.  SR 61  bpm  PACs     Lipid Panel    Component Value Date/Time   CHOL 152 08/10/2017 0937   TRIG 80 08/10/2017 0937   HDL 31 (L) 08/10/2017 0937   CHOLHDL 4.9 08/10/2017 0937   CHOLHDL 4.4 09/05/2016 1029   VLDL 22 09/05/2016 1029   LDLCALC 105 (H) 08/10/2017 0937      Wt Readings from Last 3 Encounters:  08/24/17 250 lb 12.8 oz (113.8 kg)  08/10/17 256 lb 6.4 oz (116.3 kg)  01/06/17 243 lb (110.2 kg)      ASSESSMENT AND PLAN:  1  Dyspnea. Concerning but history is a little difficult  Does not occur all the time  ? Associated tachycardia I am not convinced of angina but will not exclude   VOlume status appears OK Will check labs  He just had them drawn   Set pt up for event montor  Follow  Activites as tolerated    1  paoxysmal atrial flutter  Current spells above do not sound like the PAF he has had  in past    2  Bradycardia  Follow    3  HTN BP is OK      Current medicines are reviewed at length with the patient today.  The patient does not have concerns regarding medicines.  Disposition:   FU with  Me in November  As previously scheduled  Signed, [name], MD  08/24/2017 12:12 PM    [facility], [place], [place]  [address] Phone: [phone]; Fax: [phone]

## 2017-08-25 LAB — CBC WITH DIFFERENTIAL/PLATELET
BASOS ABS: 0 10*3/uL (ref 0.0–0.2)
Basos: 0 %
EOS (ABSOLUTE): 0.1 10*3/uL (ref 0.0–0.4)
Eos: 3 %
Hematocrit: 38.2 % (ref 37.5–51.0)
Hemoglobin: 13.3 g/dL (ref 13.0–17.7)
IMMATURE GRANS (ABS): 0 10*3/uL (ref 0.0–0.1)
IMMATURE GRANULOCYTES: 0 %
LYMPHS: 30 %
Lymphocytes Absolute: 1.4 10*3/uL (ref 0.7–3.1)
MCH: 32.8 pg (ref 26.6–33.0)
MCHC: 34.8 g/dL (ref 31.5–35.7)
MCV: 94 fL (ref 79–97)
MONOS ABS: 0.5 10*3/uL (ref 0.1–0.9)
Monocytes: 10 %
NEUTROS PCT: 57 %
Neutrophils Absolute: 2.8 10*3/uL (ref 1.4–7.0)
PLATELETS: 204 10*3/uL (ref 150–379)
RBC: 4.05 x10E6/uL — ABNORMAL LOW (ref 4.14–5.80)
RDW: 13.3 % (ref 12.3–15.4)
WBC: 4.8 10*3/uL (ref 3.4–10.8)

## 2017-08-25 LAB — TSH: TSH: 1 u[IU]/mL (ref 0.450–4.500)

## 2017-08-25 LAB — BASIC METABOLIC PANEL
BUN/Creatinine Ratio: 18 (ref 10–24)
BUN: 21 mg/dL (ref 8–27)
CALCIUM: 9.9 mg/dL (ref 8.6–10.2)
CO2: 22 mmol/L (ref 20–29)
Chloride: 100 mmol/L (ref 96–106)
Creatinine, Ser: 1.17 mg/dL (ref 0.76–1.27)
GFR calc Af Amer: 72 mL/min/{1.73_m2} (ref >59)
GFR, EST NON AFRICAN AMERICAN: 62 mL/min/{1.73_m2} (ref >59)
Glucose: 108 mg/dL — ABNORMAL HIGH (ref 65–99)
Potassium: 4.4 mmol/L (ref 3.5–5.2)
SODIUM: 139 mmol/L (ref 134–144)

## 2017-08-25 LAB — D-DIMER, QUANTITATIVE: D-DIMER: 1.32 mg/L FEU — ABNORMAL HIGH (ref 0.00–0.49)

## 2017-08-25 LAB — PRO B NATRIURETIC PEPTIDE: NT-Pro BNP: 82 pg/mL (ref 0–376)

## 2017-08-29 ENCOUNTER — Ambulatory Visit (INDEPENDENT_AMBULATORY_CARE_PROVIDER_SITE_OTHER)
Admission: RE | Admit: 2017-08-29 | Discharge: 2017-08-29 | Disposition: A | Discharge status: Home / Self Care | Payer: Medicare Other | Source: Ambulatory Visit | Attending: Internal Medicine | Admitting: Internal Medicine

## 2017-08-29 ENCOUNTER — Telehealth: Payer: Self-pay | Admitting: Internal Medicine

## 2017-08-29 DIAGNOSIS — R7989 Other specified abnormal findings of blood chemistry: Secondary | ICD-10-CM

## 2017-08-29 DIAGNOSIS — R0602 Shortness of breath: Secondary | ICD-10-CM | POA: Diagnosis not present

## 2017-08-29 DIAGNOSIS — M4716 Other spondylosis with myelopathy, lumbar region: Secondary | ICD-10-CM | POA: Diagnosis not present

## 2017-08-29 MED ORDER — IOPAMIDOL (ISOVUE-370) INJECTION 76%
80.0000 mL | Freq: Once | INTRAVENOUS | Status: AC | PRN
  Administered 2017-08-29: 80 mL via INTRAVENOUS

## 2017-08-29 NOTE — Telephone Encounter (Signed)
Spoke with CT and called patient.  They can schedule him this afternoon.  Order placed.  Pt will come to office now.  His creatinine was checked last week.   He is appreciative for the assistance.

## 2017-08-29 NOTE — Telephone Encounter (Signed)
Patient reports no new symptoms Low energy in general Slightly nauseated. With little activity he will get winded, check BP/HR--BP lower than normal (110s/60) and HR higher than normal 80-100.    Checked monitoring----No auto trigger rhythms or patient did not press button to send any symptoms.    Reviewed recent labs, d-dimer abnormal.  Called Dr. Harrington Challenger to inform and review.

## 2017-08-29 NOTE — Telephone Encounter (Signed)
Set pt up for CT scan of chest to r/o PE

## 2017-08-29 NOTE — Telephone Encounter (Signed)
New Message    Pt doesn't feel good, feeling nauseated and lethargic, feeling winded (not quite short of breath) just feeling off.     Can you see this on the heart monitor ?

## 2017-08-31 ENCOUNTER — Telehealth: Payer: Self-pay | Admitting: Internal Medicine

## 2017-08-31 NOTE — Telephone Encounter (Signed)
Notes recorded by Fay Records, MD on 08/29/2017 at 11:26 PM EDT No pulmonary embolus Small lymph nodes, probably reactive, in R underarm area WOuld follow clinically If only evaluate further if they enlarge, become palpable

## 2017-08-31 NOTE — Telephone Encounter (Signed)
°  New Message   pt verbalized that he is calling for rn   To go over test results

## 2017-08-31 NOTE — Telephone Encounter (Signed)
Patient made aware of CT results. Patient verbalizes understanding.

## 2017-09-12 ENCOUNTER — Telehealth: Payer: Self-pay | Admitting: Internal Medicine

## 2017-09-12 NOTE — Telephone Encounter (Signed)
New Message   pt verbalized that he is calling for Dr.Ross  He wants to talk about his Holter monitor he has 2 weeks left

## 2017-09-12 NOTE — Telephone Encounter (Signed)
Discussed lab results and chest CT results with patient.

## 2017-09-12 NOTE — Telephone Encounter (Signed)
Pt wants Dr Harrington Challenger to know that he has not had any more shortness of breath. Pt states he  needs back surgery done, surgeon requesting cardiac clearance.  Pt advised to ask surgeon to contact our office with surgery details and any requests for Dr Harrington Challenger to review.  I will forward to Dr Harrington Challenger for review.

## 2017-09-20 ENCOUNTER — Other Ambulatory Visit: Payer: Self-pay | Admitting: Internal Medicine

## 2017-09-21 ENCOUNTER — Encounter: Payer: Self-pay | Admitting: Internal Medicine

## 2017-09-22 DIAGNOSIS — R002 Palpitations: Secondary | ICD-10-CM | POA: Diagnosis not present

## 2017-09-25 ENCOUNTER — Encounter: Payer: Self-pay | Admitting: *Deleted

## 2017-09-25 DIAGNOSIS — Z23 Encounter for immunization: Secondary | ICD-10-CM | POA: Diagnosis not present

## 2017-09-25 NOTE — Progress Notes (Signed)
Letter from Dr. Harrington Challenger 09/21/17 faxed to Dr. Glenna Fellows fax: [phone].  Confirmation of receipt received.

## 2017-10-06 DIAGNOSIS — M48062 Spinal stenosis, lumbar region with neurogenic claudication: Secondary | ICD-10-CM | POA: Diagnosis not present

## 2017-10-06 DIAGNOSIS — M4316 Spondylolisthesis, lumbar region: Secondary | ICD-10-CM | POA: Diagnosis not present

## 2017-10-06 DIAGNOSIS — M7138 Other bursal cyst, other site: Secondary | ICD-10-CM | POA: Diagnosis not present

## 2017-10-26 DIAGNOSIS — L821 Other seborrheic keratosis: Secondary | ICD-10-CM | POA: Diagnosis not present

## 2017-10-26 DIAGNOSIS — Z85820 Personal history of malignant melanoma of skin: Secondary | ICD-10-CM | POA: Diagnosis not present

## 2017-10-26 DIAGNOSIS — D229 Melanocytic nevi, unspecified: Secondary | ICD-10-CM | POA: Diagnosis not present

## 2017-10-26 DIAGNOSIS — L814 Other melanin hyperpigmentation: Secondary | ICD-10-CM | POA: Diagnosis not present

## 2017-10-30 ENCOUNTER — Telehealth (HOSPITAL_COMMUNITY): Payer: Self-pay | Admitting: *Deleted

## 2017-10-30 ENCOUNTER — Ambulatory Visit (INDEPENDENT_AMBULATORY_CARE_PROVIDER_SITE_OTHER): Payer: Medicare Other | Admitting: Internal Medicine

## 2017-10-30 ENCOUNTER — Encounter: Payer: Self-pay | Admitting: Internal Medicine

## 2017-10-30 VITALS — BP 134/60 | HR 43 | Ht 71.0 in | Wt 255.4 lb

## 2017-10-30 DIAGNOSIS — I4892 Unspecified atrial flutter: Secondary | ICD-10-CM

## 2017-10-30 DIAGNOSIS — E782 Mixed hyperlipidemia: Secondary | ICD-10-CM

## 2017-10-30 DIAGNOSIS — R0602 Shortness of breath: Secondary | ICD-10-CM

## 2017-10-30 MED ORDER — ROSUVASTATIN CALCIUM 5 MG PO TABS: 5.0000 mg | ORAL_TABLET | Freq: Every day | ORAL | 3 refills | Status: DC

## 2017-10-30 NOTE — Progress Notes (Signed)
Cardiology Office Note   Date:  10/30/2017   ID:  [name], DOB 1946-07-31, MRN [id number]  PCP:  [name], MD  Cardiologist:   [name], MD   Pt presents for dyspnea eval     History of Present Illness: [name] is a 71 y.o. male with a history of paroxy atrial flutter, HTN and HL   Echo showed normal LVEF   Holter monitor showed 8.7% PVCs  Freq PACs      I saw him earleir this fall when he complained of SOB  Intermittent  He called the office    Said he had noticed more SOB   [place]  On [place] helping with docking    Had to stop what he was doing  Has happened other times  IN shower caught him  Had to slow   Not at all times  Intermittent  For example= loading truck he had no problems   He does say that with spells he noticed some heart racing   Not like atrial flutter  Different   I sched a CT to r/o PE  This was neg  Did show CAD of LAD and RCA  On calling the pt he said he felt better  No SOB  The pt returns for f/u  Back is really bothering him  Surg postponed to Dec He says now he gets SOB with activity  No CP    No PND   Pt's wt is down form last time  He is continuing to diet  Limting carbs     Outpatient Medications Prior to Visit  Medication Sig Dispense Refill  . amLODipine (NORVASC) 5 MG tablet TAKE 1 TABLET BY MOUTH TWICE DAILY 180 tablet 3  . aspirin EC 81 MG tablet Take 81 mg by mouth daily.    . baclofen (LIORESAL) 10 MG tablet Take 1 tablet (10 mg total) by mouth every 8 (eight) hours as needed for muscle spasms. 50 each 2  . benazepril (LOTENSIN) 20 MG tablet TAKE 1 TABLET BY MOUTH TWICE DAILY 180 tablet 3  . glucosamine-chondroitin 500-400 MG tablet Take 1 tablet by mouth daily.    . hydrochlorothiazide (,MICROZIDE/HYDRODIURIL,) 12.5 MG capsule Take 12.5 mg by mouth daily.      Marland Kitchen lidocaine (XYLOCAINE) 5 % ointment Apply 1 application topically 3 (three) times daily as needed. For pain 35.44 g 1  . metoprolol tartrate  (LOPRESSOR) 25 MG tablet Take 25 mg by mouth as needed.    . Multiple Vitamin (MULTIVITAMIN) tablet Take 1 tablet by mouth daily.    Marland Kitchen oxyCODONE (ROXICODONE) 5 MG immediate release tablet Take 1-2 tablets (5-10 mg total) by mouth every 4 (four) hours as needed for severe pain or breakthrough pain. 60 tablet 0  . oxyCODONE-acetaminophen (PERCOCET) 10-325 MG per tablet Take 1 tablet by mouth every 6 (six) hours as needed for pain.     . tamsulosin (FLOMAX) 0.4 MG CAPS capsule Take 0.4 mg by mouth daily.    . vitamin B-12 (CYANOCOBALAMIN) 500 MCG tablet Take 500 mcg by mouth daily.    . vitamin C (ASCORBIC ACID) 500 MG tablet Take 500 mg by mouth daily.    . furosemide (LASIX) 40 MG tablet TAKE ONE TABLET BY MOUTH UP TO 3 TIMES PER WEEK AS NEEDED FOR EDEMA (Patient not taking: Reported on 10/30/2017) 30 tablet 3  . potassium chloride SA (K-DUR,KLOR-CON) 20 MEQ tablet TAKE AS DIRECTED.  TAKE ONE TABLET  WITH FUROSEMIDE (Patient not taking: Reported on 10/30/2017) 30 tablet 3   No facility-administered medications prior to visit.      Allergies:   Niacin   Past Medical History:  Diagnosis Date  . Arthritis   . Complication of anesthesia    FUSion of SKULL thru C7- patient has no movement of head or neck - stiffness from fusion  . Diplopia 07/10/2013  . Diverticulosis of colon (without mention of hemorrhage) 2009   Colonoscopy  . Dyslipidemia   . Dysrhythmia    PAF- not frequently  . HTN (hypertension)   . Hx of colonic polyps 2009   Colonoscopy(Hyperplastic)  . Obesity   . 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 Paroxysmal atrial flutter [facility])     Past Surgical History:  Procedure Laterality Date  . [place]   2 surgery- skull to C7  . COLON RESECTION  2007  . COLONOSCOPY W/ POLYPECTOMY    . REPLACEMENT TOTAL KNEE Left 2005   left  . SYNOVIAL CYST EXCISION     lumbar spine  . TONSILLECTOMY       Social History:  The patient  reports that  has never  smoked. he has never used smokeless tobacco. He reports that he does not drink alcohol or use drugs.   Family History:  The patient's family history includes Coronary artery disease in his father and mother.    ROS:  Please see the history of present illness. All other systems are reviewed and  Negative to the above problem except as noted.    PHYSICAL EXAM: VS:  BP 134/60   Pulse (!) 43   Ht 5\' 11"  (1.803 m)   Wt 255 lb 6.4 oz (115.8 kg)   SpO2 97%   BMI 35.62 kg/m   Walking around clinic P100  Sats went to low of 93%  No significant symptomas GEN:  Obese  in no acute distress  HEENT: normal  Neck: JVP normal , carotid bruits, or masses Cardiac: RRR   ; no murmurs, rubs, or gallops,  Tr edema  Respiratory:  clear to auscultation bilaterally, normal work of breathing GI: soft, nontender, nondistended, + BS  No hepatomegaly  MS: no deformity Moving all extremities   Skin: warm and dry, no rash Neuro:  Strength and sensation are intact Psych: euthymic mood, full affect   EKG:  EKG is not ordered    Lipid Panel    Component Value Date/Time   CHOL 152 08/10/2017 0937   TRIG 80 08/10/2017 0937   HDL 31 (L) 08/10/2017 0937   CHOLHDL 4.9 08/10/2017 0937   CHOLHDL 4.4 09/05/2016 1029   VLDL 22 09/05/2016 1029   LDLCALC 105 (H) 08/10/2017 0937      Wt Readings from Last 3 Encounters:  10/30/17 255 lb 6.4 oz (115.8 kg)  08/24/17 250 lb 12.8 oz (113.8 kg)  08/10/17 256 lb 6.4 oz (116.3 kg)      ASSESSMENT AND PLAN:  1  Dyspnea. Concerning  Appears more progressive  Not sporadic   I would recomm a lexican myovue to r/o flow limiting CAD     1  paoxysmal atrial flutter    No symptoms   2  Bradycardia  Follow  BP OK  3  HTN BP is OK     5  HL  SHould be on a statin with CAD  Will add Crestor 5  F/U lipidis in 8 wks     Current medicines  are reviewed at length with the patient today.  The patient does not have concerns regarding medicines.  Disposition:   FU with   Me in November  As previously scheduled  Signed, [name], MD  10/30/2017 9:00 AM    [address], [place], [place]  [address] Phone: [phone]; Fax: [phone]      Cardiology Office Note   Date:  10/30/2017   ID:  [name], DOB Apr 07, 1946, MRN [id number]  PCP:  [name], MD  Cardiologist:   [name], MD   Pt presents for dyspnea eval     History of Present Illness: [name] is a 71 y.o. male with a history of paroxy atrial flutter, HTN and HL   Echo showed normal LVEF   Holter monitor showed 8.7% PVCs  Freq PACs     just saw the pt a few wks ago in clinic   He called the office this week   Says he had noticed more SOB   [place] beach  On boat helping with docking    Had to stop what he was doing  Has happened other times  IN shower caught him  Had to slow   Not at all times  Intermittent  For example= loading truck he had no problems   He does say that with spells he noticed some heart racing   Not like atrial flutter  Different    No PND   Pt's wt is down form last time  He is continuing to diet  Limting carbs     Outpatient Medications Prior to Visit  Medication Sig Dispense Refill  . amLODipine (NORVASC) 5 MG tablet TAKE 1 TABLET BY MOUTH TWICE DAILY 180 tablet 3  . aspirin EC 81 MG tablet Take 81 mg by mouth daily.    . baclofen (LIORESAL) 10 MG tablet Take 1 tablet (10 mg total) by mouth every 8 (eight) hours as needed for muscle spasms. 50 each 2  . benazepril (LOTENSIN) 20 MG tablet TAKE 1 TABLET BY MOUTH TWICE DAILY 180 tablet 3  . glucosamine-chondroitin 500-400 MG tablet Take 1 tablet by mouth daily.    . hydrochlorothiazide (,MICROZIDE/HYDRODIURIL,) 12.5 MG capsule Take 12.5 mg by mouth daily.      Marland Kitchen lidocaine (XYLOCAINE) 5 % ointment Apply 1 application topically 3 (three) times daily as needed. For pain 35.44 g 1  . metoprolol tartrate (LOPRESSOR) 25 MG tablet Take 25 mg by mouth as needed.     . Multiple Vitamin (MULTIVITAMIN) tablet Take 1 tablet by mouth daily.    Marland Kitchen oxyCODONE (ROXICODONE) 5 MG immediate release tablet Take 1-2 tablets (5-10 mg total) by mouth every 4 (four) hours as needed for severe pain or breakthrough pain. 60 tablet 0  . oxyCODONE-acetaminophen (PERCOCET) 10-325 MG per tablet Take 1 tablet by mouth every 6 (six) hours as needed for pain.     . tamsulosin (FLOMAX) 0.4 MG CAPS capsule Take 0.4 mg by mouth daily.    . vitamin B-12 (CYANOCOBALAMIN) 500 MCG tablet Take 500 mcg by mouth daily.    . vitamin C (ASCORBIC ACID) 500 MG tablet Take 500 mg by mouth daily.    . furosemide (LASIX) 40 MG tablet TAKE ONE TABLET BY MOUTH UP TO 3 TIMES PER WEEK AS NEEDED FOR EDEMA (Patient not taking: Reported on 10/30/2017) 30 tablet 3  . potassium chloride SA (K-DUR,KLOR-CON) 20 MEQ tablet TAKE AS DIRECTED.  TAKE ONE TABLET WITH FUROSEMIDE (Patient not taking: Reported on 10/30/2017) 30 tablet 3   No facility-administered medications prior to visit.      Allergies:   Niacin   Past Medical History:  Diagnosis Date  . Arthritis   . Complication of anesthesia    FUSion of SKULL thru C7- patient has no movement of head or neck - stiffness from fusion  . Diplopia 07/10/2013  . Diverticulosis of colon (without mention of hemorrhage) 2009   Colonoscopy  . Dyslipidemia   . Dysrhythmia    PAF- not frequently  . HTN (hypertension)   . Hx of colonic polyps 2009   Colonoscopy(Hyperplastic)  . Obesity   . 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 Paroxysmal atrial flutter [facility])     Past Surgical History:  Procedure Laterality Date  . [place]   2 surgery- skull to C7  . COLON RESECTION  2007  . COLONOSCOPY W/ POLYPECTOMY    . REPLACEMENT TOTAL KNEE Left 2005   left  . SYNOVIAL CYST EXCISION     lumbar spine  . TONSILLECTOMY       Social History:  The patient  reports that  has never smoked. he has never used smokeless tobacco. He reports that  he does not drink alcohol or use drugs.   Family History:  The patient's family history includes Coronary artery disease in his father and mother.    ROS:  Please see the history of present illness. All other systems are reviewed and  Negative to the above problem except as noted.    PHYSICAL EXAM: VS:  BP 134/60   Pulse (!) 43   Ht 5\' 11"  (1.803 m)   Wt 255 lb 6.4 oz (115.8 kg)   SpO2 97%   BMI 35.62 kg/m   Walking around clinic P100  Sats went to low of 93%  No significant symptomas GEN:  Obese  in no acute distress  HEENT: normal  Neck: no JVD, carotid bruits, or masses Cardiac: RRR with occasional skip  ; no murmurs, rubs, or gallops,  Tr edema  Respiratory:  clear to auscultation bilaterally, normal work of breathing GI: soft, nontender, nondistended, + BS  No hepatomegaly  MS: no deformity Moving all extremities   Skin: warm and dry, no rash Neuro:  Strength and sensation are intact Psych: euthymic mood, full affect   EKG:  EKG is ordered today.  SR 61  bpm  PACs     Lipid Panel    Component Value Date/Time   CHOL 152 08/10/2017 0937   TRIG 80 08/10/2017 0937   HDL 31 (L) 08/10/2017 0937   CHOLHDL 4.9 08/10/2017 0937   CHOLHDL 4.4 09/05/2016 1029   VLDL 22 09/05/2016 1029   LDLCALC 105 (H) 08/10/2017 0937      Wt Readings from Last 3 Encounters:  10/30/17 255 lb 6.4 oz (115.8 kg)  08/24/17 250 lb 12.8 oz (113.8 kg)  08/10/17 256 lb 6.4 oz (116.3 kg)      ASSESSMENT AND PLAN:  1  Dyspnea. Concerning but history is a little difficult  Does not occur all the time  ? Associated tachycardia I am not convinced of angina but will not exclude   VOlume status appears OK Will check labs  He just had them drawn   Set pt up for event montor  Follow  Activites as tolerated    1  paoxysmal atrial flutter  Current spells above do not sound  like the PAF he has had in past    2  Bradycardia  Follow    3  HTN BP is OK      Current medicines are reviewed at length  with the patient today.  The patient does not have concerns regarding medicines.  Disposition:   FU with  Me in November  As previously scheduled  Signed, [name], MD  10/30/2017 9:01 AM    [address], [place], [place]  [address] Phone: [phone]; Fax: [phone]

## 2017-10-30 NOTE — Telephone Encounter (Signed)
Patient given detailed instructions per Myocardial Perfusion Study Information Sheet for the test on 10/31/17 at 7:45. Patient notified to arrive 15 minutes early and that it is imperative to arrive on time for appointment to keep from having the test rescheduled.  If you need to cancel or reschedule your appointment, please call the office within 24 hours of your appointment. . Patient verbalized understanding.[name]

## 2017-10-30 NOTE — Patient Instructions (Signed)
Your physician has recommended you make the following change in your medication:  1.) crestor 5 mg one tablet daily  Your physician has requested that you have a lexiscan myoview. For further information please visit [email or web address]. Please follow instruction sheet, as given.

## 2017-10-31 ENCOUNTER — Ambulatory Visit (HOSPITAL_COMMUNITY): Payer: Medicare Other | Attending: Cardiology

## 2017-10-31 DIAGNOSIS — E782 Mixed hyperlipidemia: Secondary | ICD-10-CM | POA: Diagnosis not present

## 2017-10-31 DIAGNOSIS — Z125 Encounter for screening for malignant neoplasm of prostate: Secondary | ICD-10-CM | POA: Diagnosis not present

## 2017-10-31 DIAGNOSIS — E669 Obesity, unspecified: Secondary | ICD-10-CM | POA: Diagnosis not present

## 2017-10-31 DIAGNOSIS — Z1389 Encounter for screening for other disorder: Secondary | ICD-10-CM | POA: Diagnosis not present

## 2017-10-31 DIAGNOSIS — M5416 Radiculopathy, lumbar region: Secondary | ICD-10-CM | POA: Diagnosis not present

## 2017-10-31 DIAGNOSIS — Z0000 Encounter for general adult medical examination without abnormal findings: Secondary | ICD-10-CM | POA: Diagnosis not present

## 2017-10-31 DIAGNOSIS — R0602 Shortness of breath: Secondary | ICD-10-CM | POA: Diagnosis not present

## 2017-10-31 DIAGNOSIS — R7303 Prediabetes: Secondary | ICD-10-CM | POA: Diagnosis not present

## 2017-10-31 DIAGNOSIS — E7800 Pure hypercholesterolemia, unspecified: Secondary | ICD-10-CM | POA: Diagnosis not present

## 2017-10-31 DIAGNOSIS — I4892 Unspecified atrial flutter: Secondary | ICD-10-CM | POA: Diagnosis not present

## 2017-10-31 DIAGNOSIS — M1990 Unspecified osteoarthritis, unspecified site: Secondary | ICD-10-CM | POA: Diagnosis not present

## 2017-10-31 DIAGNOSIS — I10 Essential (primary) hypertension: Secondary | ICD-10-CM | POA: Diagnosis not present

## 2017-10-31 LAB — MYOCARDIAL PERFUSION IMAGING
CHL CUP NUCLEAR SDS: 1
CHL CUP NUCLEAR SRS: 1
CHL CUP NUCLEAR SSS: 2
LHR: 0.37
LV sys vol: 80 mL
LVDIAVOL: 158 mL (ref 62–150)
NUC STRESS TID: 1
Peak HR: 99 {beats}/min
Rest HR: 77 {beats}/min

## 2017-10-31 MED ORDER — TECHNETIUM TC 99M TETROFOSMIN IV KIT
32.4000 | PACK | Freq: Once | INTRAVENOUS | Status: AC | PRN
  Administered 2017-10-31: 32.4 via INTRAVENOUS
  Filled 2017-10-31: qty 33

## 2017-10-31 MED ORDER — REGADENOSON 0.4 MG/5ML IV SOLN
0.4000 mg | Freq: Once | INTRAVENOUS | Status: AC
  Administered 2017-10-31: 0.4 mg via INTRAVENOUS

## 2017-10-31 MED ORDER — TECHNETIUM TC 99M TETROFOSMIN IV KIT
10.2000 | PACK | Freq: Once | INTRAVENOUS | Status: AC | PRN
  Administered 2017-10-31: 10.2 via INTRAVENOUS
  Filled 2017-10-31: qty 11

## 2017-11-13 DIAGNOSIS — M4316 Spondylolisthesis, lumbar region: Secondary | ICD-10-CM | POA: Diagnosis not present

## 2017-11-13 DIAGNOSIS — Z01818 Encounter for other preprocedural examination: Secondary | ICD-10-CM | POA: Diagnosis not present

## 2017-11-23 DIAGNOSIS — G629 Polyneuropathy, unspecified: Secondary | ICD-10-CM | POA: Diagnosis present

## 2017-11-23 DIAGNOSIS — E669 Obesity, unspecified: Secondary | ICD-10-CM | POA: Diagnosis present

## 2017-11-23 DIAGNOSIS — Z7982 Long term (current) use of aspirin: Secondary | ICD-10-CM | POA: Diagnosis not present

## 2017-11-23 DIAGNOSIS — M5116 Intervertebral disc disorders with radiculopathy, lumbar region: Secondary | ICD-10-CM | POA: Diagnosis present

## 2017-11-23 DIAGNOSIS — M4326 Fusion of spine, lumbar region: Secondary | ICD-10-CM | POA: Diagnosis not present

## 2017-11-23 DIAGNOSIS — Z79891 Long term (current) use of opiate analgesic: Secondary | ICD-10-CM | POA: Diagnosis not present

## 2017-11-23 DIAGNOSIS — M48061 Spinal stenosis, lumbar region without neurogenic claudication: Secondary | ICD-10-CM | POA: Diagnosis present

## 2017-11-23 DIAGNOSIS — I4892 Unspecified atrial flutter: Secondary | ICD-10-CM | POA: Diagnosis not present

## 2017-11-23 DIAGNOSIS — E7800 Pure hypercholesterolemia, unspecified: Secondary | ICD-10-CM | POA: Diagnosis present

## 2017-11-23 DIAGNOSIS — Z79899 Other long term (current) drug therapy: Secondary | ICD-10-CM | POA: Diagnosis not present

## 2017-11-23 DIAGNOSIS — M4316 Spondylolisthesis, lumbar region: Secondary | ICD-10-CM | POA: Diagnosis not present

## 2017-11-23 DIAGNOSIS — M7138 Other bursal cyst, other site: Secondary | ICD-10-CM | POA: Diagnosis present

## 2017-11-23 DIAGNOSIS — Z6836 Body mass index (BMI) 36.0-36.9, adult: Secondary | ICD-10-CM | POA: Diagnosis not present

## 2017-11-23 DIAGNOSIS — M1990 Unspecified osteoarthritis, unspecified site: Secondary | ICD-10-CM | POA: Diagnosis present

## 2017-11-23 DIAGNOSIS — M4726 Other spondylosis with radiculopathy, lumbar region: Secondary | ICD-10-CM | POA: Diagnosis present

## 2017-11-23 DIAGNOSIS — I10 Essential (primary) hypertension: Secondary | ICD-10-CM | POA: Diagnosis present

## 2017-11-23 DIAGNOSIS — M47816 Spondylosis without myelopathy or radiculopathy, lumbar region: Secondary | ICD-10-CM | POA: Diagnosis not present

## 2017-11-23 DIAGNOSIS — Z981 Arthrodesis status: Secondary | ICD-10-CM | POA: Diagnosis not present

## 2017-11-23 DIAGNOSIS — Z888 Allergy status to other drugs, medicaments and biological substances status: Secondary | ICD-10-CM | POA: Diagnosis not present

## 2017-12-01 DIAGNOSIS — M48061 Spinal stenosis, lumbar region without neurogenic claudication: Secondary | ICD-10-CM | POA: Diagnosis not present

## 2017-12-01 DIAGNOSIS — M4316 Spondylolisthesis, lumbar region: Secondary | ICD-10-CM | POA: Diagnosis not present

## 2017-12-01 DIAGNOSIS — Z981 Arthrodesis status: Secondary | ICD-10-CM | POA: Diagnosis not present

## 2017-12-01 DIAGNOSIS — M4807 Spinal stenosis, lumbosacral region: Secondary | ICD-10-CM | POA: Diagnosis not present

## 2017-12-27 DIAGNOSIS — Z981 Arthrodesis status: Secondary | ICD-10-CM | POA: Diagnosis not present

## 2017-12-27 DIAGNOSIS — M4316 Spondylolisthesis, lumbar region: Secondary | ICD-10-CM | POA: Diagnosis not present

## 2017-12-27 DIAGNOSIS — M47816 Spondylosis without myelopathy or radiculopathy, lumbar region: Secondary | ICD-10-CM | POA: Diagnosis not present

## 2018-04-20 DIAGNOSIS — M4716 Other spondylosis with myelopathy, lumbar region: Secondary | ICD-10-CM | POA: Diagnosis not present

## 2018-04-20 DIAGNOSIS — M4316 Spondylolisthesis, lumbar region: Secondary | ICD-10-CM | POA: Diagnosis not present

## 2018-04-20 DIAGNOSIS — M7138 Other bursal cyst, other site: Secondary | ICD-10-CM | POA: Diagnosis not present

## 2018-05-02 DIAGNOSIS — E7800 Pure hypercholesterolemia, unspecified: Secondary | ICD-10-CM | POA: Diagnosis not present

## 2018-05-02 DIAGNOSIS — R7303 Prediabetes: Secondary | ICD-10-CM | POA: Diagnosis not present

## 2018-05-02 DIAGNOSIS — D039 Melanoma in situ, unspecified: Secondary | ICD-10-CM | POA: Diagnosis not present

## 2018-05-02 DIAGNOSIS — I4892 Unspecified atrial flutter: Secondary | ICD-10-CM | POA: Diagnosis not present

## 2018-05-02 DIAGNOSIS — I10 Essential (primary) hypertension: Secondary | ICD-10-CM | POA: Diagnosis not present

## 2018-05-03 DIAGNOSIS — L570 Actinic keratosis: Secondary | ICD-10-CM | POA: Diagnosis not present

## 2018-05-03 DIAGNOSIS — L821 Other seborrheic keratosis: Secondary | ICD-10-CM | POA: Diagnosis not present

## 2018-05-03 DIAGNOSIS — Z85820 Personal history of malignant melanoma of skin: Secondary | ICD-10-CM | POA: Diagnosis not present

## 2018-05-03 DIAGNOSIS — D1801 Hemangioma of skin and subcutaneous tissue: Secondary | ICD-10-CM | POA: Diagnosis not present

## 2018-05-03 DIAGNOSIS — L814 Other melanin hyperpigmentation: Secondary | ICD-10-CM | POA: Diagnosis not present

## 2018-05-23 ENCOUNTER — Encounter: Payer: Self-pay | Admitting: Gastroenterology

## 2018-05-23 DIAGNOSIS — R197 Diarrhea, unspecified: Secondary | ICD-10-CM | POA: Diagnosis not present

## 2018-05-23 DIAGNOSIS — R7309 Other abnormal glucose: Secondary | ICD-10-CM | POA: Diagnosis not present

## 2018-05-23 DIAGNOSIS — K5732 Diverticulitis of large intestine without perforation or abscess without bleeding: Secondary | ICD-10-CM | POA: Diagnosis not present

## 2018-05-25 DIAGNOSIS — R7303 Prediabetes: Secondary | ICD-10-CM | POA: Diagnosis not present

## 2018-06-12 ENCOUNTER — Ambulatory Visit (INDEPENDENT_AMBULATORY_CARE_PROVIDER_SITE_OTHER): Payer: Medicare Other | Admitting: Gastroenterology

## 2018-06-12 ENCOUNTER — Encounter: Payer: Self-pay | Admitting: Gastroenterology

## 2018-06-12 VITALS — BP 140/70 | HR 66 | Ht 71.0 in | Wt 257.0 lb

## 2018-06-12 DIAGNOSIS — R194 Change in bowel habit: Secondary | ICD-10-CM | POA: Insufficient documentation

## 2018-06-12 NOTE — Patient Instructions (Signed)
Use Benefiber or Citrucel powder once or twice daily.

## 2018-06-12 NOTE — Progress Notes (Signed)
06/12/2018 [name] [id number] 01-Jan-1946   HISTORY OF PRESENT ILLNESS:  This is a 72 year old male who is previously known to Dr. Sharlett Iles.  Last colonoscopy was 11/2012 at which time he was found to have a normal exam with evidence of previous anastomosis.  He had laparoscopic sigmoid resection in 2007 due to diverticular disease.  Presents here today with a "change in bowel habits".  He tells me that for the past 3 to 4 months his stools have been a #4 on the [facility] stool scale.  He says that that is not normal for him and he is used to, and prefers, a #3.  He tells me that it is harder for him to wipe when he has a slightly softer formed stool such as a #4.  He denies any change in frequency, caliber, color, etc.  He denies abdominal pain or seeing blood in his stools.  He tells me he moves his bowels usually twice a day.  Occasionally, once every 2 or 3 weeks, he will have an episode of diarrhea if he eats something that maybe does not agree with his stomach.   Past Medical History:  Diagnosis Date  . Arthritis   . Complication of anesthesia    FUSion of SKULL thru C7- patient has no movement of head or neck - stiffness from fusion  . Diplopia 07/10/2013  . Diverticulosis of colon (without mention of hemorrhage) 2009   Colonoscopy  . Dyslipidemia   . Dysrhythmia    PAF- not frequently  . HTN (hypertension)   . Hx of colonic polyps 2009   Colonoscopy(Hyperplastic)  . Obesity   . 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 Paroxysmal atrial flutter [facility])    Past Surgical History:  Procedure Laterality Date  . [place]   2 surgery- skull to C7  . COLON RESECTION  2007  . COLONOSCOPY W/ POLYPECTOMY    . HEMORRHOID SURGERY N/A 01/13/2016   Procedure: HEMORRHOIDECTOMY AND REMOVAL OF ANAL SKIN TAG;  Surgeon: Coralie Keens, MD;  Location: [facility];  Service: General;  Laterality: N/A;  . REPLACEMENT TOTAL KNEE Left 2005   left  . SYNOVIAL CYST EXCISION      lumbar spine  . TONSILLECTOMY    . TOTAL KNEE ARTHROPLASTY Right 01/06/2017   Procedure: RIGHT TOTAL KNEE ARTHROPLASTY;  Surgeon: Sydnee Cabal, MD;  Location: [facility];  Service: Orthopedics;  Laterality: Right;  Adductior Block    reports that he has never smoked. He has never used smokeless tobacco. He reports that he does not drink alcohol or use drugs. family history includes Coronary artery disease in his father and mother. Allergies  Allergen Reactions  . Niacin Diarrhea      Outpatient Encounter Medications as of 06/12/2018  Medication Sig  . amLODipine (NORVASC) 5 MG tablet TAKE 1 TABLET BY MOUTH TWICE DAILY  . aspirin EC 81 MG tablet Take 81 mg by mouth daily.  . baclofen (LIORESAL) 10 MG tablet Take 1 tablet (10 mg total) by mouth every 8 (eight) hours as needed for muscle spasms.  . benazepril (LOTENSIN) 20 MG tablet TAKE 1 TABLET BY MOUTH TWICE DAILY  . glucosamine-chondroitin 500-400 MG tablet Take 1 tablet by mouth daily.  . hydrochlorothiazide (,MICROZIDE/HYDRODIURIL,) 12.5 MG capsule Take 12.5 mg by mouth daily.    Marland Kitchen lidocaine (XYLOCAINE) 5 % ointment Apply 1 application topically 3 (three) times daily as needed. For pain  . metoprolol tartrate (LOPRESSOR)  25 MG tablet Take 25 mg by mouth as needed.  . Multiple Vitamin (MULTIVITAMIN) tablet Take 1 tablet by mouth daily.  Marland Kitchen oxyCODONE (ROXICODONE) 5 MG immediate release tablet Take 1-2 tablets (5-10 mg total) by mouth every 4 (four) hours as needed for severe pain or breakthrough pain.  Marland Kitchen oxyCODONE-acetaminophen (PERCOCET) 10-325 MG per tablet Take 1 tablet by mouth every 6 (six) hours as needed for pain.   . tamsulosin (FLOMAX) 0.4 MG CAPS capsule Take 0.4 mg by mouth daily.  . vitamin B-12 (CYANOCOBALAMIN) 500 MCG tablet Take 500 mcg by mouth daily.  . vitamin C (ASCORBIC ACID) 500 MG tablet Take 500 mg by mouth daily.  . rosuvastatin (CRESTOR) 5 MG tablet Take 1 tablet (5 mg total) daily by mouth.   No  facility-administered encounter medications on file as of 06/12/2018.      REVIEW OF SYSTEMS  : All other systems reviewed and negative except where noted in the History of Present Illness.   PHYSICAL EXAM: BP 140/70   Pulse 66   Ht 5\' 11"  (1.803 m)   Wt 257 lb (116.6 kg)   BMI 35.84 kg/m  General: Well developed white male in no acute distress Head: Normocephalic and atraumatic Eyes:  Sclerae anicteric, conjunctiva pink. Ears: Normal auditory acuity Lungs: Clear throughout to auscultation; no increased WOB. Heart: Regular rate and rhythm; no M/R/G. Abdomen: Soft, non-distended.  BS present.  Non-tender. Musculoskeletal: Symmetrical with no gross deformities  Skin: No lesions on visible extremities Extremities: No edema  Neurological: Alert oriented x 4, grossly non-focal Psychological:  Alert and cooperative. Normal mood and affect  ASSESSMENT AND PLAN: *72 year old male with "change in bowel habits".  He is currently having normal stools, number 4 on the [place] stool scale, but is used to and prefers a number 3.  No change in frequency, shape, caliber, etc.  No abdominal pain or bleeding.  I offered reassurance that I do not think that there is any serious issues.  Will start Benefiber or Citrucel powder daily to see if this will bulk and firm up his stools.     CC:  [name], MD

## 2018-06-15 DIAGNOSIS — R197 Diarrhea, unspecified: Secondary | ICD-10-CM | POA: Diagnosis not present

## 2018-06-15 NOTE — Progress Notes (Signed)
Reviewed and agree with documentation and assessment and plan. K. Veena Nandigam , MD   

## 2018-07-23 DIAGNOSIS — I10 Essential (primary) hypertension: Secondary | ICD-10-CM | POA: Diagnosis not present

## 2018-07-23 DIAGNOSIS — R195 Other fecal abnormalities: Secondary | ICD-10-CM | POA: Diagnosis not present

## 2018-08-13 DIAGNOSIS — N401 Enlarged prostate with lower urinary tract symptoms: Secondary | ICD-10-CM | POA: Diagnosis not present

## 2018-08-13 DIAGNOSIS — N411 Chronic prostatitis: Secondary | ICD-10-CM | POA: Diagnosis not present

## 2018-08-13 DIAGNOSIS — R300 Dysuria: Secondary | ICD-10-CM | POA: Diagnosis not present

## 2018-08-29 ENCOUNTER — Other Ambulatory Visit: Payer: Self-pay | Admitting: Neurosurgery

## 2018-08-29 DIAGNOSIS — M4316 Spondylolisthesis, lumbar region: Secondary | ICD-10-CM

## 2018-09-03 ENCOUNTER — Ambulatory Visit
Admission: RE | Admit: 2018-09-03 | Discharge: 2018-09-03 | Disposition: A | Discharge status: Home / Self Care | Payer: Medicare Other | Source: Ambulatory Visit | Attending: Neurosurgery | Admitting: Neurosurgery

## 2018-09-03 DIAGNOSIS — M5117 Intervertebral disc disorders with radiculopathy, lumbosacral region: Secondary | ICD-10-CM | POA: Diagnosis not present

## 2018-09-03 DIAGNOSIS — M4316 Spondylolisthesis, lumbar region: Secondary | ICD-10-CM

## 2018-09-05 DIAGNOSIS — Z23 Encounter for immunization: Secondary | ICD-10-CM | POA: Diagnosis not present

## 2018-09-13 DIAGNOSIS — M4316 Spondylolisthesis, lumbar region: Secondary | ICD-10-CM | POA: Diagnosis not present

## 2018-09-13 DIAGNOSIS — M48062 Spinal stenosis, lumbar region with neurogenic claudication: Secondary | ICD-10-CM | POA: Diagnosis not present

## 2018-09-13 DIAGNOSIS — M4712 Other spondylosis with myelopathy, cervical region: Secondary | ICD-10-CM | POA: Diagnosis not present

## 2018-10-16 DIAGNOSIS — M4316 Spondylolisthesis, lumbar region: Secondary | ICD-10-CM | POA: Diagnosis not present

## 2018-10-16 DIAGNOSIS — M4716 Other spondylosis with myelopathy, lumbar region: Secondary | ICD-10-CM | POA: Diagnosis not present

## 2018-10-16 DIAGNOSIS — M48062 Spinal stenosis, lumbar region with neurogenic claudication: Secondary | ICD-10-CM | POA: Diagnosis not present

## 2018-11-06 DIAGNOSIS — D1801 Hemangioma of skin and subcutaneous tissue: Secondary | ICD-10-CM | POA: Diagnosis not present

## 2018-11-06 DIAGNOSIS — L821 Other seborrheic keratosis: Secondary | ICD-10-CM | POA: Diagnosis not present

## 2018-11-06 DIAGNOSIS — Z85820 Personal history of malignant melanoma of skin: Secondary | ICD-10-CM | POA: Diagnosis not present

## 2018-11-06 DIAGNOSIS — D229 Melanocytic nevi, unspecified: Secondary | ICD-10-CM | POA: Diagnosis not present

## 2018-11-06 DIAGNOSIS — L570 Actinic keratosis: Secondary | ICD-10-CM | POA: Diagnosis not present

## 2018-11-08 DIAGNOSIS — M4316 Spondylolisthesis, lumbar region: Secondary | ICD-10-CM | POA: Diagnosis not present

## 2018-11-08 DIAGNOSIS — Z01818 Encounter for other preprocedural examination: Secondary | ICD-10-CM | POA: Diagnosis not present

## 2018-11-08 DIAGNOSIS — Z01811 Encounter for preprocedural respiratory examination: Secondary | ICD-10-CM | POA: Diagnosis not present

## 2018-11-12 DIAGNOSIS — M47896 Other spondylosis, lumbar region: Secondary | ICD-10-CM | POA: Diagnosis not present

## 2018-11-12 DIAGNOSIS — Z79899 Other long term (current) drug therapy: Secondary | ICD-10-CM | POA: Diagnosis not present

## 2018-11-12 DIAGNOSIS — M1990 Unspecified osteoarthritis, unspecified site: Secondary | ICD-10-CM | POA: Diagnosis present

## 2018-11-12 DIAGNOSIS — I10 Essential (primary) hypertension: Secondary | ICD-10-CM | POA: Diagnosis present

## 2018-11-12 DIAGNOSIS — Z8669 Personal history of other diseases of the nervous system and sense organs: Secondary | ICD-10-CM | POA: Diagnosis not present

## 2018-11-12 DIAGNOSIS — M47897 Other spondylosis, lumbosacral region: Secondary | ICD-10-CM | POA: Diagnosis present

## 2018-11-12 DIAGNOSIS — M4716 Other spondylosis with myelopathy, lumbar region: Secondary | ICD-10-CM | POA: Diagnosis not present

## 2018-11-12 DIAGNOSIS — Z7982 Long term (current) use of aspirin: Secondary | ICD-10-CM | POA: Diagnosis not present

## 2018-11-12 DIAGNOSIS — Z981 Arthrodesis status: Secondary | ICD-10-CM | POA: Diagnosis not present

## 2018-11-12 DIAGNOSIS — G629 Polyneuropathy, unspecified: Secondary | ICD-10-CM | POA: Diagnosis present

## 2018-11-12 DIAGNOSIS — M4327 Fusion of spine, lumbosacral region: Secondary | ICD-10-CM | POA: Diagnosis not present

## 2018-11-12 DIAGNOSIS — M4317 Spondylolisthesis, lumbosacral region: Secondary | ICD-10-CM | POA: Diagnosis not present

## 2018-11-22 DIAGNOSIS — M4326 Fusion of spine, lumbar region: Secondary | ICD-10-CM | POA: Diagnosis not present

## 2018-11-22 DIAGNOSIS — M4316 Spondylolisthesis, lumbar region: Secondary | ICD-10-CM | POA: Diagnosis not present

## 2018-12-03 ENCOUNTER — Other Ambulatory Visit: Payer: Self-pay | Admitting: Internal Medicine

## 2018-12-14 IMAGING — MR MR LUMBAR SPINE WO/W CM
4 of 7 series · 18 of 48 positions shown · IV contrast (multihance)
Comparison: 04/17/2016

CLINICAL DATA: Back pain since knee surgery 10 weeks ago. Right hip
and leg pain.

EXAM:
MRI LUMBAR SPINE WITHOUT AND WITH CONTRAST
TECHNIQUE: Multiplanar and multiecho pulse sequences of the lumbar spine were
obtained without and with intravenous contrast.
CONTRAST:  20 mL MultiHance

[Series 8: T1 · sagittal · 4.0mm · 0.73mm/px · 3 of 15 slices shown]
[im 1/15]
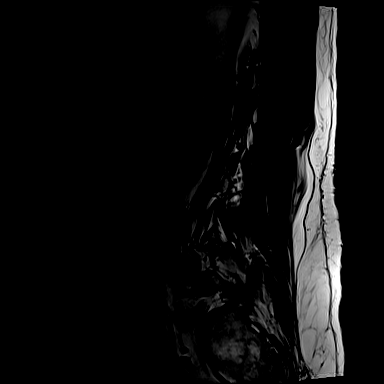
[im 8/15]
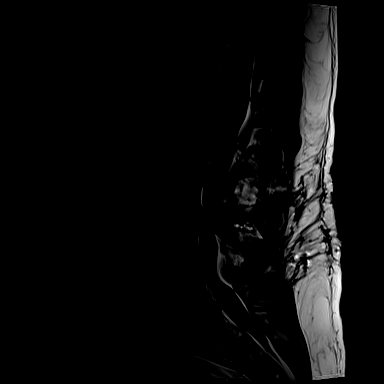
[im 15/15]
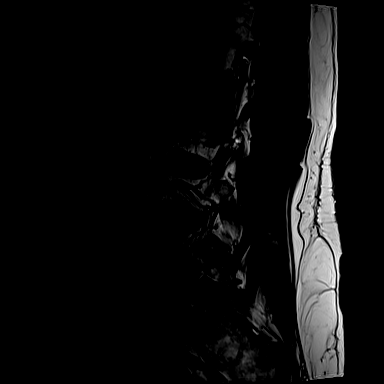

[Series 14: T2 · axial · 4.0mm · 0.28mm/px · z∈[-206,+3]mm · 9 of 45 slices shown (1 of 2)]
[im 1/45]
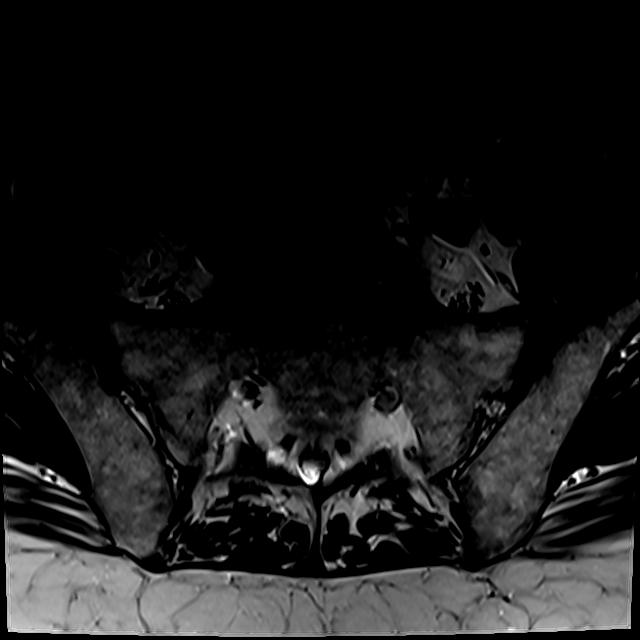
[im 5/45]
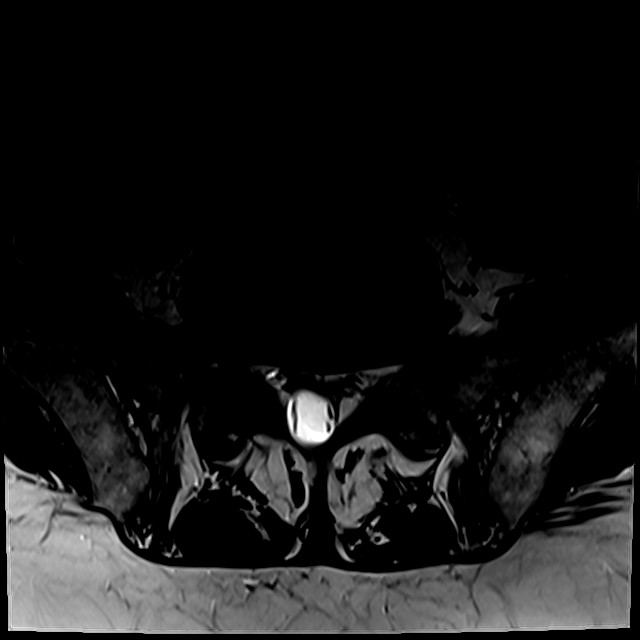
[im 9/45]
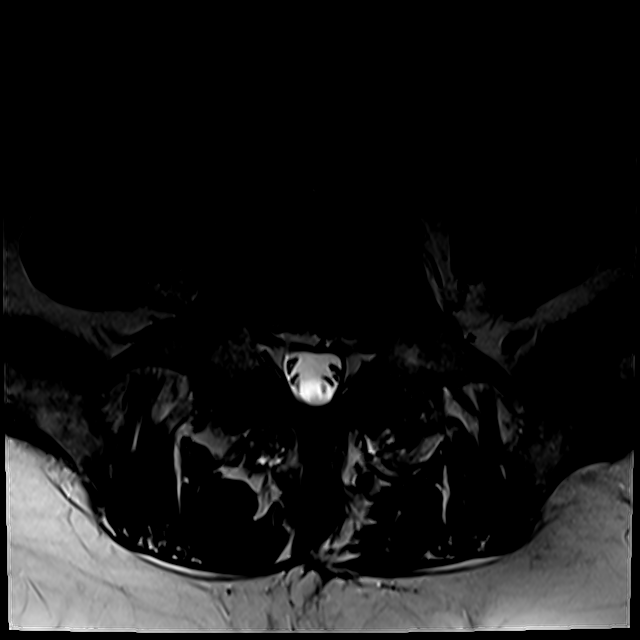
[im 14/45]
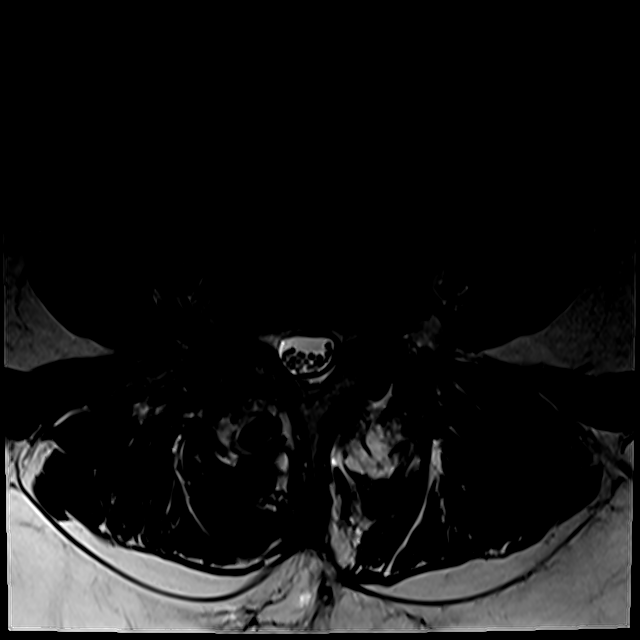
[im 18/45]
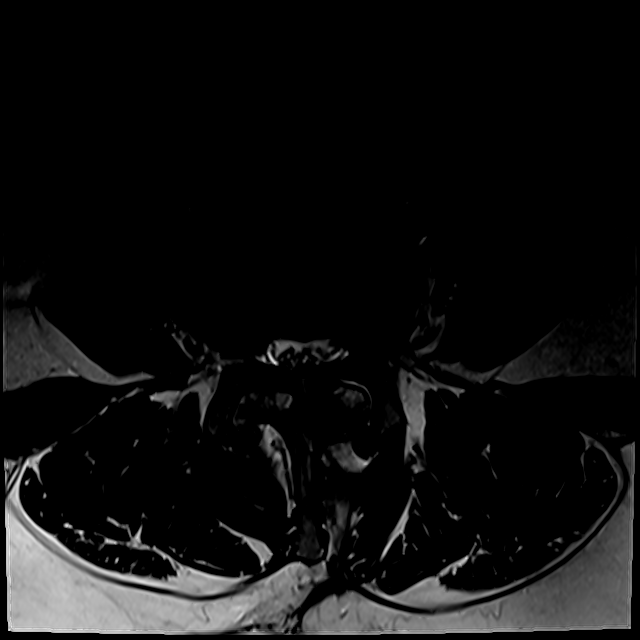
[im 23/45]
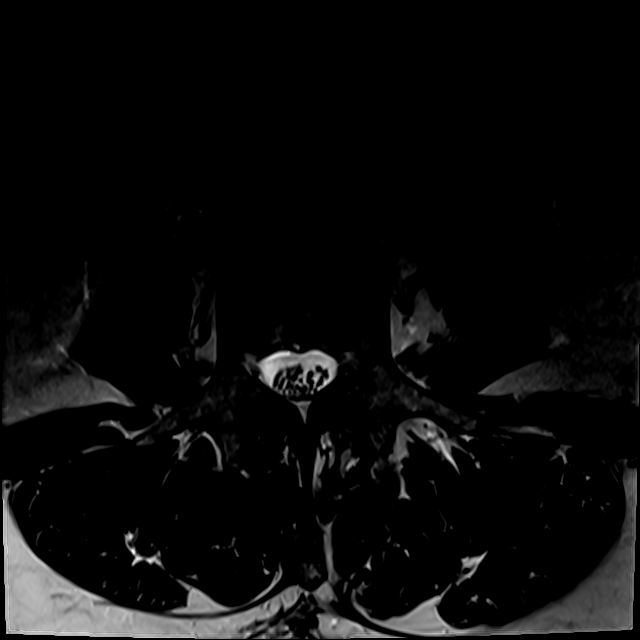
[im 27/45]
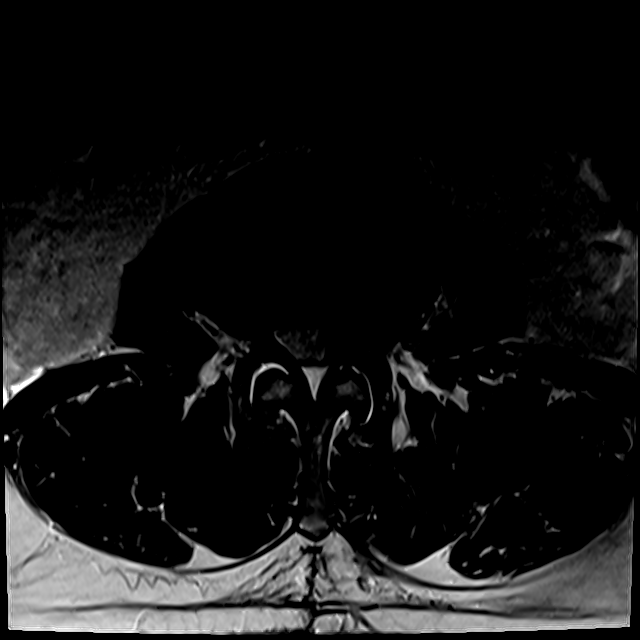
[im 31/45]
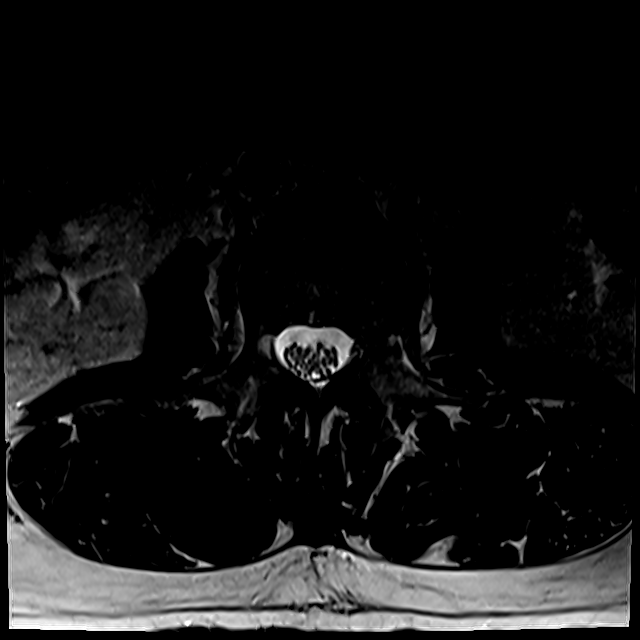
[im 40/45]
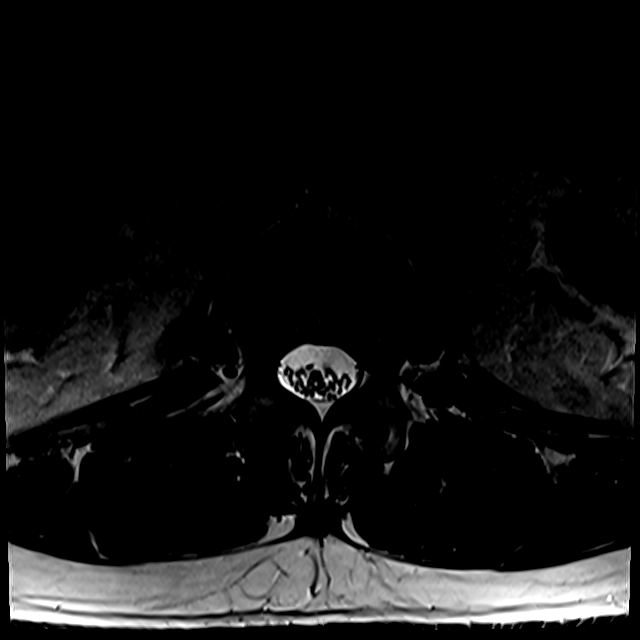

[Series 15: T2 · sagittal · 4.0mm · 0.73mm/px · 3 of 15 slices shown (2 of 2)]
[im 1/15]
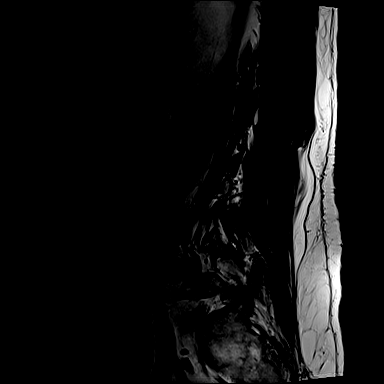
[im 10/15]
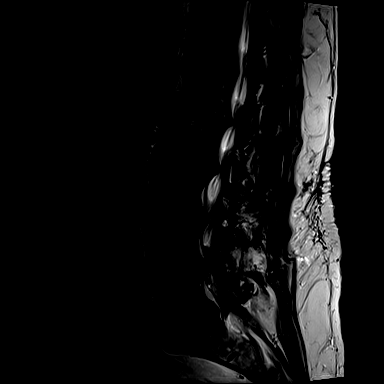
[im 15/15]
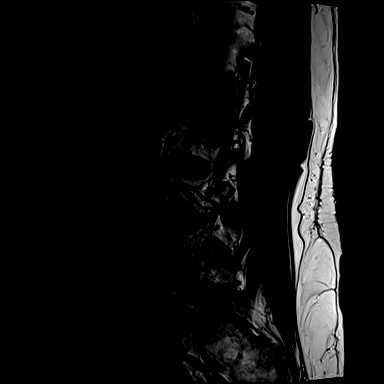

[Series 16: T1 fat-sat post-contrast · sagittal · 4.0mm · 0.73mm/px · 3 of 15 slices shown]
[im 1/15]
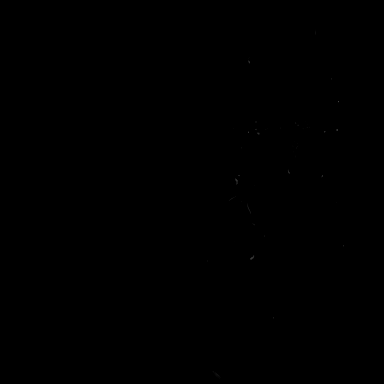
[im 10/15]
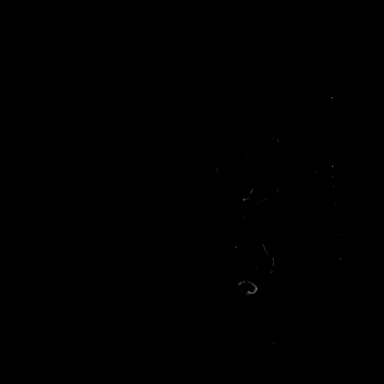
[im 15/15]
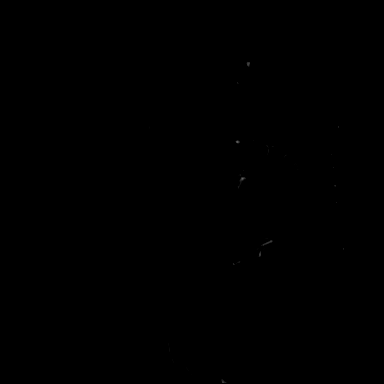

[18 of 48 positions shown; findings below may reference images not displayed]

FINDINGS: Segmentation:  Standard.

Alignment:  2 mm retrolisthesis of L1 on L2-L2 on L3.

Vertebrae: No fracture, evidence of discitis, or bone lesion. Mild
osteoarthritis of bilateral SI joints.

Conus medullaris: Extends to the T12 level and appears normal.

Paraspinal and other soft tissues: No paraspinal abnormality.
Postsurgical changes in the posterior paraspinal soft tissues from
L3 through L4.

Disc levels:

Disc spaces: Degenerative disc disease with disc height loss at L1-2
and L2-3. Disc desiccation throughout the remainder of the lumbar
spine.

T12-L1: Mild broad-based disc bulge. Mild bilateral facet
arthropathy. No evidence of neural foraminal stenosis. No central
canal stenosis.

L1-L2: Broad-based disc bulge flattening the ventral thecal sac.
Moderate bilateral facet arthropathy. Bilateral lateral recess
stenosis. Mild bilateral foraminal stenosis. Mild spinal stenosis.

L2-L3: Broad-based disc bulge. Severe bilateral facet arthropathy.
Moderate-severe spinal stenosis. Severe left foraminal stenosis. No
right foraminal stenosis.

L3-L4: Broad-based disc bulge. Severe bilateral facet arthropathy.
Moderate - severe spinal stenosis and severe left lateral recess
stenosis. Mild left foraminal stenosis.

L4-L5: Broad-based disc bulge with a left far-lateral disc
extrusion. Severe bilateral facet arthropathy. Moderate spinal
stenosis and severe bilateral lateral recess stenosis. Moderate
right and mild left foraminal stenosis.

L5-S1: Mild broad-based disc bulge. Moderate bilateral facet
arthropathy. No evidence of neural foraminal stenosis. No central
canal stenosis.
IMPRESSION: 1. At L4-5 there is a broad-based disc bulge with a left far-lateral
disc extrusion. Severe bilateral facet arthropathy. Moderate spinal
stenosis and severe bilateral lateral recess stenosis. Moderate
right and mild left foraminal stenosis.
2. At L3-4 is a broad-based disc bulge. Severe bilateral facet
arthropathy. Moderate - severe spinal stenosis and severe left
lateral recess stenosis. Mild left foraminal stenosis.
3. At L2-3 there is a broad-based disc bulge. Severe bilateral facet
arthropathy. Moderate-severe spinal stenosis. Severe left foraminal
stenosis. No right foraminal stenosis.
4. At L1-2 there is a broad-based disc bulge flattening the ventral
thecal sac. Moderate bilateral facet arthropathy. Bilateral lateral
recess stenosis. Mild bilateral foraminal stenosis. Mild spinal
stenosis.

## 2018-12-18 DIAGNOSIS — Z1159 Encounter for screening for other viral diseases: Secondary | ICD-10-CM | POA: Diagnosis not present

## 2018-12-18 DIAGNOSIS — Z0000 Encounter for general adult medical examination without abnormal findings: Secondary | ICD-10-CM | POA: Diagnosis not present

## 2018-12-18 DIAGNOSIS — E782 Mixed hyperlipidemia: Secondary | ICD-10-CM | POA: Diagnosis not present

## 2018-12-18 DIAGNOSIS — D039 Melanoma in situ, unspecified: Secondary | ICD-10-CM | POA: Diagnosis not present

## 2018-12-18 DIAGNOSIS — I10 Essential (primary) hypertension: Secondary | ICD-10-CM | POA: Diagnosis not present

## 2018-12-18 DIAGNOSIS — R7303 Prediabetes: Secondary | ICD-10-CM | POA: Diagnosis not present

## 2018-12-18 DIAGNOSIS — I700 Atherosclerosis of aorta: Secondary | ICD-10-CM | POA: Diagnosis not present

## 2018-12-18 DIAGNOSIS — Z1389 Encounter for screening for other disorder: Secondary | ICD-10-CM | POA: Diagnosis not present

## 2018-12-18 DIAGNOSIS — N400 Enlarged prostate without lower urinary tract symptoms: Secondary | ICD-10-CM | POA: Diagnosis not present

## 2018-12-18 DIAGNOSIS — Z6835 Body mass index (BMI) 35.0-35.9, adult: Secondary | ICD-10-CM | POA: Diagnosis not present

## 2018-12-18 DIAGNOSIS — I4892 Unspecified atrial flutter: Secondary | ICD-10-CM | POA: Diagnosis not present

## 2019-01-08 ENCOUNTER — Other Ambulatory Visit: Payer: Self-pay | Admitting: Internal Medicine

## 2019-01-09 ENCOUNTER — Other Ambulatory Visit: Payer: Self-pay | Admitting: Internal Medicine

## 2019-01-09 MED ORDER — AMLODIPINE BESYLATE 5 MG PO TABS: 5.0000 mg | ORAL_TABLET | Freq: Two times a day (BID) | ORAL | 0 refills | Status: DC

## 2019-01-09 MED ORDER — BENAZEPRIL HCL 20 MG PO TABS: 20.0000 mg | ORAL_TABLET | Freq: Two times a day (BID) | ORAL | 0 refills | Status: DC

## 2019-01-09 MED ORDER — ROSUVASTATIN CALCIUM 5 MG PO TABS: 5.0000 mg | ORAL_TABLET | Freq: Every day | ORAL | 0 refills | Status: DC

## 2019-01-09 NOTE — Telephone Encounter (Signed)
Pt's medications were sent to pt's pharmacy as requested. Confirmation received.  

## 2019-01-09 NOTE — Telephone Encounter (Signed)
 *  STAT* If patient is at the pharmacy, call can be transferred to refill team.   1. Which medications need to be refilled? (please list name of each medication and dose if known)  amLODipine (NORVASC) 5 MG tablet benazepril (LOTENSIN) 20 MG tablet rosuvastatin (CRESTOR) 5 MG tablet  2. Which pharmacy/location (including street and city if local pharmacy) is medication to be sent to?  [place]  3. Do they need a 30 day or 90 day supply? 30  Patient has an appt on 01/29/19 @ 10:45 with Richardson Dopp. Will need enough to medication to make it to his appt.

## 2019-01-14 ENCOUNTER — Other Ambulatory Visit: Payer: Self-pay | Admitting: Internal Medicine

## 2019-01-14 MED ORDER — BENAZEPRIL HCL 20 MG PO TABS: 20.0000 mg | ORAL_TABLET | Freq: Two times a day (BID) | ORAL | 1 refills | Status: DC

## 2019-01-14 MED ORDER — AMLODIPINE BESYLATE 5 MG PO TABS: 5.0000 mg | ORAL_TABLET | Freq: Two times a day (BID) | ORAL | 1 refills | Status: DC

## 2019-01-14 MED ORDER — ROSUVASTATIN CALCIUM 5 MG PO TABS: 5.0000 mg | ORAL_TABLET | Freq: Every day | ORAL | 1 refills | Status: DC

## 2019-01-14 NOTE — Telephone Encounter (Signed)
I did confirm with the pt he is not having any reaction problems to his medications.

## 2019-01-14 NOTE — Telephone Encounter (Signed)
I s/w pt in regards to his Rx only sent in for 7 day supply. I have s/w pt and he has appt with Richardson Dopp, PA 01/29/19. Rx's have been verified and and sent in to [facility]. Pt is upset that he had to pay for the Rx's once already and now has to pay for them again. Pt is also upset that if Dr. Harrington Challenger wants to see him in 6 months he cannot make an appt. Pt states he cannot always remember to make an appt.

## 2019-01-14 NOTE — Telephone Encounter (Signed)
New Message   Pt c/o medication issue:  1. Name of Medication: amlodipine, crestor, benazepril  2. How are you currently taking this medication (dosage and times per day)? Benazepril 2x daily 20 mg  Amlodipine 2x daily 5mg  Crestor 1x daily 5mg   3. Are you having a reaction (difficulty breathing--STAT)? Yes   4. What is your medication issue? Pt spent $45 for the prescriptions but he only has 7 days worth of medication and that is not enough to get him to his next appt on 02/11 Pt is upset he spent this amount of money for only 7 days of medication

## 2019-01-15 DIAGNOSIS — D696 Thrombocytopenia, unspecified: Secondary | ICD-10-CM | POA: Diagnosis not present

## 2019-01-23 DIAGNOSIS — Z981 Arthrodesis status: Secondary | ICD-10-CM | POA: Diagnosis not present

## 2019-01-23 DIAGNOSIS — M4316 Spondylolisthesis, lumbar region: Secondary | ICD-10-CM | POA: Diagnosis not present

## 2019-01-23 DIAGNOSIS — N200 Calculus of kidney: Secondary | ICD-10-CM | POA: Diagnosis not present

## 2019-01-29 ENCOUNTER — Encounter: Payer: Self-pay | Admitting: Physician Assistant

## 2019-01-29 ENCOUNTER — Ambulatory Visit (INDEPENDENT_AMBULATORY_CARE_PROVIDER_SITE_OTHER): Payer: Medicare Other | Admitting: Physician Assistant

## 2019-01-29 VITALS — BP 126/60 | HR 60 | Ht 71.0 in | Wt 256.8 lb

## 2019-01-29 DIAGNOSIS — I10 Essential (primary) hypertension: Secondary | ICD-10-CM

## 2019-01-29 DIAGNOSIS — I4892 Unspecified atrial flutter: Secondary | ICD-10-CM

## 2019-01-29 DIAGNOSIS — I2510 Atherosclerotic heart disease of native coronary artery without angina pectoris: Secondary | ICD-10-CM

## 2019-01-29 DIAGNOSIS — E785 Hyperlipidemia, unspecified: Secondary | ICD-10-CM

## 2019-01-29 MED ORDER — ROSUVASTATIN CALCIUM 5 MG PO TABS: 5.0000 mg | ORAL_TABLET | Freq: Every day | ORAL | 3 refills | Status: DC

## 2019-01-29 MED ORDER — AMLODIPINE BESYLATE 5 MG PO TABS: 5.0000 mg | ORAL_TABLET | Freq: Two times a day (BID) | ORAL | 3 refills | Status: DC

## 2019-01-29 MED ORDER — BENAZEPRIL HCL 20 MG PO TABS: 20.0000 mg | ORAL_TABLET | Freq: Two times a day (BID) | ORAL | 3 refills | Status: DC

## 2019-01-29 NOTE — Progress Notes (Signed)
Cardiology Office Note:    Date:  01/29/2019   ID:  [name], DOB [birth year], MRN [id number]  PCP:  [name], MD  Cardiologist:  [name], MD   Electrophysiologist:  None   Referring MD: [name], MD   Chief Complaint  Patient presents with  . Follow-up    coronary Ca, parox AFlutter     History of Present Illness:    [name] is a 73 y.o. male with paroxysmal atrial flutter, hypertension, hyperlipidemia, PVCs/PACs, coronary calcification on prior Chest CT.[name]  He was last seen by Dr. Harrington Challenger in 10/2017.     [name] returns for follow up.  He is here alone.  He had multilevel lumbar fusion with Dr. Carloyn Manner in Nov 2019.  He seems to be recovering well from this.  He has not had significant palpitations.  He has not had chest pain, significant shortness of breath, syncope.  He has not had significant leg swelling.  He did have issues last year with dizziness and near syncope.  This all seemed to be related to Tamsulosin.  His symptoms resolved after stopping this drug.    Prior CV studies:   The following studies were reviewed today:  Myoview 10/31/17 EF 50, no ischemia; Low Risk  Chest CTA 08/29/17 FINDINGS: Cardiovascular: Satisfactory opacification of the bilateral pulmonary arteries to the segmental level. No evidence of pulmonary embolism. No evidence of thoracic aortic aneurysm or dissection. The heart is normal in size.  No pericardial effusion. Coronary atherosclerosis the LAD and right coronary artery. Mediastinum/Nodes: No suspicious mediastinal lymphadenopathy. 1.8 cm short axis right axillary node (series 4/image 30), with additional smaller bilateral axillary nodes which are mildly prominent but nonenlarged. Lungs/Pleura: Mild lingular scarring/ atelectasis. No focal consolidation. No suspicious pulmonary nodules. 5 mm subpleural nodule in the lateral right middle lobe (series 6/image 57), likely reflecting a benign subpleural lymph  node. No pleural effusion or pneumothorax. Upper Abdomen: Visualized upper abdomen is unremarkable. Musculoskeletal: Degenerative changes of the visualized thoracolumbar spine. Review of the MIP images confirms the above findings.  IMPRESSION: No evidence of pulmonary embolism. 1.8 cm short axis right axillary node, indeterminate. Additional mildly prominent bilateral axillary nodes which are not pathologically enlarged. Overall, reactive lymph nodes are favored. However, if this finding persists clinically, consider percutaneous sampling of the dominant right axillary node.   Event Monitor 08/24/17 Sinus rhythm with occasional PAC No arrhythmias detected  Echo 06/15/16 EF 55-60, no RWMA, normal diastolic function, trivial MR, normal RVSF, PASP 32  24 Hr Holter 06/13/16 Sinus rhythm.  HR 39 to 142 bpm  Average HR 59 bpm   Frequent PVC (8.7% total), Frequent PAC.   One burst NSVT (9 beats) No diary entries    Echo 08/01/14 EF 55-60, Gr 2 DD, mild MR, mod LAE   Past Medical History:  Diagnosis Date  . Arthritis   . Complication of anesthesia    FUSion of SKULL thru C7- patient has no movement of head or neck - stiffness from fusion  . Diplopia 07/10/2013  . Diverticulosis of colon (without mention of hemorrhage) 2009   Colonoscopy  . Dyslipidemia   . Dysrhythmia    PAF- not frequently  . HTN (hypertension)   . Hx of colonic polyps 2009   Colonoscopy(Hyperplastic)  . Obesity   . 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 Paroxysmal atrial flutter ([place])    Surgical Hx: The patient  has a past surgical history that includes Cervical fusion (1996, 1997);  Replacement total knee (Left, 2005); Colon resection (2007); Synovial cyst excision; Tonsillectomy; Colonoscopy w/ polypectomy; Hemorrhoid surgery (N/A, 01/13/2016); and Total knee arthroplasty (Right, 01/06/2017).   Current Medications: Current Meds  Medication Sig  . amLODipine (NORVASC) 5 MG tablet Take 1 tablet (5 mg  total) by mouth 2 (two) times daily.  [name] aspirin EC 81 MG tablet Take 81 mg by mouth daily.  . baclofen (LIORESAL) 10 MG tablet Take 1 tablet (10 mg total) by mouth every 8 (eight) hours as needed for muscle spasms.  . benazepril (LOTENSIN) 20 MG tablet Take 1 tablet (20 mg total) by mouth 2 (two) times daily.  [name] glucosamine-chondroitin 500-400 MG tablet Take 1 tablet by mouth daily.  . hydrochlorothiazide (,MICROZIDE/HYDRODIURIL,) 12.5 MG capsule Take 12.5 mg by mouth daily.    [name] lidocaine (XYLOCAINE) 5 % ointment Apply 1 application topically 3 (three) times daily as needed. For pain  . metoprolol tartrate (LOPRESSOR) 25 MG tablet Take 25 mg by mouth as needed.  . Multiple Vitamin (MULTIVITAMIN) tablet Take 1 tablet by mouth daily.  [name] oxyCODONE (ROXICODONE) 5 MG immediate release tablet Take 1-2 tablets (5-10 mg total) by mouth every 4 (four) hours as needed for severe pain or breakthrough pain.  [name] oxyCODONE-acetaminophen (PERCOCET) 10-325 MG per tablet Take 1 tablet by mouth every 6 (six) hours as needed for pain.   . rosuvastatin (CRESTOR) 5 MG tablet Take 1 tablet (5 mg total) by mouth daily.  . vitamin B-12 (CYANOCOBALAMIN) 500 MCG tablet Take 500 mcg by mouth daily.  . vitamin C (ASCORBIC ACID) 500 MG tablet Take 500 mg by mouth daily.  . [DISCONTINUED] tamsulosin (FLOMAX) 0.4 MG CAPS capsule Take 0.4 mg by mouth daily.     Allergies:   Tamsulosin hcl and Niacin   Social History   Tobacco Use  . Smoking status: Never Smoker  . Smokeless tobacco: Never Used  Substance Use Topics  . Alcohol use: No  . Drug use: No     Family Hx: The patient's family history includes Coronary artery disease in his father and mother. There is no history of Colon cancer or Stomach cancer.  ROS:   Please see the history of present illness.    ROS All other systems reviewed and are negative.   EKGs/Labs/Other Test Reviewed:    EKG:  EKG is  ordered today.  The ekg ordered today demonstrates  normal sinus rhythm, HR 60, normal axis, PACs, QTc 390, no significant changes  Recent Labs: No results found for requested labs within last 8760 hours.   Recent Lipid Panel Lab Results  Component Value Date/Time   CHOL 152 08/10/2017 09:37 AM   TRIG 80 08/10/2017 09:37 AM   HDL 31 (L) 08/10/2017 09:37 AM   CHOLHDL 4.9 08/10/2017 09:37 AM   CHOLHDL 4.4 09/05/2016 10:29 AM   LDLCALC 105 (H) 08/10/2017 09:37 AM   From [facility] Cholesterol, total 106.000 12/18/2018 HDL 32.000 12/18/2018 LDL 54.000 12/18/2018 Triglycerides 105.000 12/18/2018 A1C 5.500 12/18/2018 Hemoglobin 13.200 01/15/2019 Creatinine, Serum 0.980 12/18/2018 Potassium 4.300 12/18/2018 ALT (SGPT) 23.000 12/18/2018 TSH 1.700 12/18/2018 INR 0.930 12/30/2016 Platelets 204.000 08/24/2017  Physical Exam:    VS:  BP 126/60   Pulse 60   Ht 5\' 11"  (1.803 m)   Wt 256 lb 12.8 oz (116.5 kg)   SpO2 96%   BMI 35.82 kg/m     Wt Readings from Last 3 Encounters:  01/29/19 256 lb 12.8 oz (116.5 kg)  06/12/18 257 lb (116.6 kg)  10/31/17 255 lb (115.7 kg)     Physical Exam  Constitutional: He is oriented to person, place, and time. He appears well-developed and well-nourished. No distress.  HENT:  Head: Normocephalic and atraumatic.  Eyes: No scleral icterus.  Neck: Neck supple. No JVD present. No thyromegaly present.  Cardiovascular: Normal rate and regular rhythm.  No murmur heard. Pulmonary/Chest: Effort normal. He has no wheezes. He has no rales.  Abdominal: Soft.  Musculoskeletal:        General: No edema.  Lymphadenopathy:    He has no cervical adenopathy.  Neurological: He is alert and oriented to person, place, and time.  Skin: Skin is warm and dry.  Psychiatric: He has a normal mood and affect.    ASSESSMENT & PLAN:    Atrial flutter, unspecified type ([place]) No apparent recurrence.  He has not been on anticoagulation in the past.  Continue as needed beta-blocker therapy.  Essential hypertension The  patient's blood pressure is controlled on his current regimen.  Continue current therapy.    Coronary artery calcification seen on CT scan No anginal symptoms.  Continue ASA, statin.    Dyslipidemia LDL optimal on most recent lab work.  Continue current Rx.     Dispo:  Return in about 6 months (around 07/30/2019) for Routine Follow Up, w/ Dr. Harrington Challenger.   Medication Adjustments/Labs and Tests Ordered: Current medicines are reviewed at length with the patient today.  Concerns regarding medicines are outlined above.  Tests Ordered: Orders Placed This Encounter  Procedures  . EKG 12-Lead   Medication Changes: Meds ordered this encounter  Medications  . amLODipine (NORVASC) 5 MG tablet    Sig: Take 1 tablet (5 mg total) by mouth 2 (two) times daily.    Dispense:  180 tablet    Refill:  3    Order Specific Question:   Supervising Provider    Answer:   Marlou Porch, MARK C [[id number]]  . benazepril (LOTENSIN) 20 MG tablet    Sig: Take 1 tablet (20 mg total) by mouth 2 (two) times daily.    Dispense:  180 tablet    Refill:  3    Order Specific Question:   Supervising Provider    Answer:   Marlou Porch, MARK C [[id number]]  . rosuvastatin (CRESTOR) 5 MG tablet    Sig: Take 1 tablet (5 mg total) by mouth daily.    Dispense:  90 tablet    Refill:  3    Order Specific Question:   Supervising Provider    Answer:   Jerline Pain [[id number]]    Signed, Richardson Dopp, PA-C  01/29/2019 11:38 AM    [facility] [place], [place], [place]  [address] Phone: [phone]; Fax: [phone]

## 2019-01-29 NOTE — Patient Instructions (Addendum)
Medication Instructions:  Your physician recommends that you continue on your current medications as directed. Please refer to the Current Medication list given to you today.   *Refills have been sent in to your pharmacy for your Cardiac medications.  If you need a refill on your cardiac medications before your next appointment, please call your pharmacy.   Lab work: NONE  If you have labs (blood work) drawn today and your tests are completely normal, you will receive your results only by: Marland Kitchen MyChart Message (if you have MyChart) OR . A paper copy in the mail If you have any lab test that is abnormal or we need to change your treatment, we will call you to review the results.  Testing/Procedures: NONE  Follow-Up: At [facility], you and your health needs are our priority.  As part of our continuing mission to provide you with exceptional heart care, we have created designated Provider Care Teams.  These Care Teams include your primary Cardiologist (physician) and Advanced Practice Providers (APPs -  Physician Assistants and Nurse Practitioners) who all work together to provide you with the care you need, when you need it. . You will need a follow up appointment in:  6 months.  Please call our office 2 months in advance to schedule this appointment.  You may see Dr. Harrington Challenger or one of the following Advanced Practice Providers on your designated Care Team: . Richardson Dopp, PA-C . Vin Bhagat, PA-C . Daune Perch, NP  Any Other Special Instructions Will Be Listed Below (If Applicable).   Marland Kitchen

## 2019-02-15 DIAGNOSIS — J0100 Acute maxillary sinusitis, unspecified: Secondary | ICD-10-CM | POA: Diagnosis not present

## 2019-03-12 DIAGNOSIS — H938X9 Other specified disorders of ear, unspecified ear: Secondary | ICD-10-CM | POA: Diagnosis not present

## 2019-03-12 DIAGNOSIS — H6121 Impacted cerumen, right ear: Secondary | ICD-10-CM | POA: Diagnosis not present

## 2019-03-12 DIAGNOSIS — H9319 Tinnitus, unspecified ear: Secondary | ICD-10-CM | POA: Diagnosis not present

## 2019-06-19 DIAGNOSIS — M5136 Other intervertebral disc degeneration, lumbar region: Secondary | ICD-10-CM | POA: Diagnosis not present

## 2019-06-19 DIAGNOSIS — E782 Mixed hyperlipidemia: Secondary | ICD-10-CM | POA: Diagnosis not present

## 2019-06-19 DIAGNOSIS — E7800 Pure hypercholesterolemia, unspecified: Secondary | ICD-10-CM | POA: Diagnosis not present

## 2019-06-19 DIAGNOSIS — I10 Essential (primary) hypertension: Secondary | ICD-10-CM | POA: Diagnosis not present

## 2019-06-19 DIAGNOSIS — M5090 Cervical disc disorder, unspecified, unspecified cervical region: Secondary | ICD-10-CM | POA: Diagnosis not present

## 2019-06-19 DIAGNOSIS — R7303 Prediabetes: Secondary | ICD-10-CM | POA: Diagnosis not present

## 2019-06-19 DIAGNOSIS — I700 Atherosclerosis of aorta: Secondary | ICD-10-CM | POA: Diagnosis not present

## 2019-06-19 DIAGNOSIS — D039 Melanoma in situ, unspecified: Secondary | ICD-10-CM | POA: Diagnosis not present

## 2019-07-31 DIAGNOSIS — M549 Dorsalgia, unspecified: Secondary | ICD-10-CM | POA: Diagnosis not present

## 2019-07-31 DIAGNOSIS — Z981 Arthrodesis status: Secondary | ICD-10-CM | POA: Diagnosis not present

## 2019-07-31 DIAGNOSIS — G8929 Other chronic pain: Secondary | ICD-10-CM | POA: Diagnosis not present

## 2019-08-05 ENCOUNTER — Other Ambulatory Visit: Payer: Self-pay | Admitting: Neurological Surgery

## 2019-08-05 DIAGNOSIS — G8929 Other chronic pain: Secondary | ICD-10-CM

## 2019-08-05 DIAGNOSIS — M549 Dorsalgia, unspecified: Secondary | ICD-10-CM

## 2019-08-12 ENCOUNTER — Ambulatory Visit
Admission: RE | Admit: 2019-08-12 | Discharge: 2019-08-12 | Disposition: A | Discharge status: Home / Self Care | Payer: Medicare Other | Source: Ambulatory Visit | Attending: Neurological Surgery | Admitting: Neurological Surgery

## 2019-08-12 DIAGNOSIS — G8929 Other chronic pain: Secondary | ICD-10-CM

## 2019-08-12 DIAGNOSIS — M545 Low back pain: Secondary | ICD-10-CM | POA: Diagnosis not present

## 2019-08-20 DIAGNOSIS — L905 Scar conditions and fibrosis of skin: Secondary | ICD-10-CM | POA: Diagnosis not present

## 2019-08-20 DIAGNOSIS — Z85820 Personal history of malignant melanoma of skin: Secondary | ICD-10-CM | POA: Diagnosis not present

## 2019-08-20 DIAGNOSIS — D1801 Hemangioma of skin and subcutaneous tissue: Secondary | ICD-10-CM | POA: Diagnosis not present

## 2019-08-20 DIAGNOSIS — L814 Other melanin hyperpigmentation: Secondary | ICD-10-CM | POA: Diagnosis not present

## 2019-08-20 DIAGNOSIS — D229 Melanocytic nevi, unspecified: Secondary | ICD-10-CM | POA: Diagnosis not present

## 2019-08-20 DIAGNOSIS — L821 Other seborrheic keratosis: Secondary | ICD-10-CM | POA: Diagnosis not present

## 2019-08-20 DIAGNOSIS — L570 Actinic keratosis: Secondary | ICD-10-CM | POA: Diagnosis not present

## 2019-08-20 DIAGNOSIS — L819 Disorder of pigmentation, unspecified: Secondary | ICD-10-CM | POA: Diagnosis not present

## 2019-08-21 DIAGNOSIS — M542 Cervicalgia: Secondary | ICD-10-CM | POA: Diagnosis not present

## 2019-08-21 DIAGNOSIS — Z981 Arthrodesis status: Secondary | ICD-10-CM | POA: Diagnosis not present

## 2019-08-21 DIAGNOSIS — S32009K Unspecified fracture of unspecified lumbar vertebra, subsequent encounter for fracture with nonunion: Secondary | ICD-10-CM | POA: Diagnosis not present

## 2019-09-13 DIAGNOSIS — R300 Dysuria: Secondary | ICD-10-CM | POA: Diagnosis not present

## 2019-09-13 DIAGNOSIS — N411 Chronic prostatitis: Secondary | ICD-10-CM | POA: Diagnosis not present

## 2019-09-23 DIAGNOSIS — R7309 Other abnormal glucose: Secondary | ICD-10-CM | POA: Diagnosis not present

## 2019-09-23 DIAGNOSIS — N411 Chronic prostatitis: Secondary | ICD-10-CM | POA: Diagnosis not present

## 2019-09-23 DIAGNOSIS — Z23 Encounter for immunization: Secondary | ICD-10-CM | POA: Diagnosis not present

## 2019-09-23 DIAGNOSIS — E782 Mixed hyperlipidemia: Secondary | ICD-10-CM | POA: Diagnosis not present

## 2019-09-23 DIAGNOSIS — D039 Melanoma in situ, unspecified: Secondary | ICD-10-CM | POA: Diagnosis not present

## 2019-09-23 DIAGNOSIS — I10 Essential (primary) hypertension: Secondary | ICD-10-CM | POA: Diagnosis not present

## 2019-09-27 DIAGNOSIS — Z1211 Encounter for screening for malignant neoplasm of colon: Secondary | ICD-10-CM | POA: Diagnosis not present

## 2019-11-20 DIAGNOSIS — S32009K Unspecified fracture of unspecified lumbar vertebra, subsequent encounter for fracture with nonunion: Secondary | ICD-10-CM | POA: Diagnosis not present

## 2019-11-27 ENCOUNTER — Telehealth: Payer: Self-pay | Admitting: *Deleted

## 2019-11-27 NOTE — Telephone Encounter (Signed)
Pt contacted off the recall list to schedule f/u appt. Pt gave verbal permission for virtual appt with Richardson Dopp, PA-C, 11/29/2019.     Virtual Visit Pre-Appointment Phone Call  "(Name), I am calling you today to discuss your upcoming appointment. We are currently trying to limit exposure to the virus that causes COVID-19 by seeing patients at home rather than in the office."  1. "What is the BEST phone number to call the day of the visit?" - include this in appointment notes  2. "Do you have or have access to (through a family member/friend) a smartphone with video capability that we can use for your visit?" a. If yes - list this number in appt notes as "cell" (if different from BEST phone #) and list the appointment type as a VIDEO visit in appointment notes b. If no - list the appointment type as a PHONE visit in appointment notes  3. Confirm consent - "In the setting of the current Covid19 crisis, you are scheduled for a (phone or video) visit with your provider on (date) at (time).  Just as we do with many in-office visits, in order for you to participate in this visit, we must obtain consent.  If you'd like, I can send this to your mychart (if signed up) or email for you to review.  Otherwise, I can obtain your verbal consent now.  All virtual visits are billed to your insurance company just like a normal visit would be.  By agreeing to a virtual visit, we'd like you to understand that the technology does not allow for your provider to perform an examination, and thus may limit your provider's ability to fully assess your condition. If your provider identifies any concerns that need to be evaluated in person, we will make arrangements to do so.  Finally, though the technology is pretty good, we cannot assure that it will always work on either your or our end, and in the setting of a video visit, we may have to convert it to a phone-only visit.  In either situation, we cannot ensure that we  have a secure connection.  Are you willing to proceed?" STAFF: Did the patient verbally acknowledge consent to telehealth visit? Document YES/NO here: YES  4. Advise patient to be prepared - "Two hours prior to your appointment, go ahead and check your blood pressure, pulse, oxygen saturation, and your weight (if you have the equipment to check those) and write them all down. When your visit starts, your provider will ask you for this information. If you have an Apple Watch or Kardia device, please plan to have heart rate information ready on the day of your appointment. Please have a pen and paper handy nearby the day of the visit as well."  5. Give patient instructions for MyChart download to smartphone OR Doximity/Doxy.me as below if video visit (depending on what platform provider is using)  6. Inform patient they will receive a phone call 15 minutes prior to their appointment time (may be from unknown caller ID) so they should be prepared to answer    TELEPHONE CALL NOTE  [name] has been deemed a candidate for a follow-up tele-health visit to limit community exposure during the Covid-19 pandemic. I spoke with the patient via phone to ensure availability of phone/video source, confirm preferred email & phone number, and discuss instructions and expectations.  I reminded [name] to be prepared with any vital sign and/or heart rhythm information that could  potentially be obtained via home monitoring, at the time of his visit. I reminded [name] to expect a phone call prior to his visit.  Jeanann Lewandowsky, [place] 11/27/2019 8:53 AM   INSTRUCTIONS FOR DOWNLOADING THE MYCHART APP TO SMARTPHONE  - The patient must first make sure to have activated MyChart and know their login information - If Apple, go to [employer] and type in MyChart in the search bar and download the app. If Android, ask patient to go to [employer] and type in [place] in the search bar and download  the app. The app is free but as with any other app downloads, their phone may require them to verify saved payment information or Apple/Android password.  - The patient will need to then log into the app with their MyChart username and password, and select [facility] as their healthcare provider to link the account. When it is time for your visit, go to the MyChart app, find appointments, and click Begin Video Visit. Be sure to Select Allow for your device to access the Microphone and Camera for your visit. You will then be connected, and your provider will be with you shortly.  **If they have any issues connecting, or need assistance please contact MyChart service desk [phone])**  **If using a computer, in order to ensure the best quality for their visit they will need to use either of the following Internet Browsers: [employer], or Google Chrome**  IF USING DOXIMITY or DOXY.ME - The patient will receive a link just prior to their visit by text.     FULL LENGTH CONSENT FOR TELE-HEALTH VISIT   I hereby voluntarily request, consent and authorize [facility] and its employed or contracted physicians, physician assistants, nurse practitioners or other licensed health care professionals (the Practitioner), to provide me with telemedicine health care services (the "Services") as deemed necessary by the treating Practitioner. I acknowledge and consent to receive the Services by the Practitioner via telemedicine. I understand that the telemedicine visit will involve communicating with the Practitioner through live audiovisual communication technology and the disclosure of certain medical information by electronic transmission. I acknowledge that I have been given the opportunity to request an in-person assessment or other available alternative prior to the telemedicine visit and am voluntarily participating in the telemedicine visit.  I understand that I have the right to withhold  or withdraw my consent to the use of telemedicine in the course of my care at any time, without affecting my right to future care or treatment, and that the Practitioner or I may terminate the telemedicine visit at any time. I understand that I have the right to inspect all information obtained and/or recorded in the course of the telemedicine visit and may receive copies of available information for a reasonable fee.  I understand that some of the potential risks of receiving the Services via telemedicine include:  Marland Kitchen Delay or interruption in medical evaluation due to technological equipment failure or disruption; . Information transmitted may not be sufficient (e.g. poor resolution of images) to allow for appropriate medical decision making by the Practitioner; and/or  . In rare instances, security protocols could fail, causing a breach of personal health information.  Furthermore, I acknowledge that it is my responsibility to provide information about my medical history, conditions and care that is complete and accurate to the best of my ability. I acknowledge that Practitioner's advice, recommendations, and/or decision may be based on factors not within their  control, such as incomplete or inaccurate data provided by me or distortions of diagnostic images or specimens that may result from electronic transmissions. I understand that the practice of medicine is not an exact science and that Practitioner makes no warranties or guarantees regarding treatment outcomes. I acknowledge that I will receive a copy of this consent concurrently upon execution via email to the email address I last provided but may also request a printed copy by calling the office of [facility].    I understand that my insurance will be billed for this visit.   I have read or had this consent read to me. . I understand the contents of this consent, which adequately explains the benefits and risks of the Services being provided  via telemedicine.  . I have been provided ample opportunity to ask questions regarding this consent and the Services and have had my questions answered to my satisfaction. . I give my informed consent for the services to be provided through the use of telemedicine in my medical care  By participating in this telemedicine visit I agree to the above.

## 2019-11-28 NOTE — Progress Notes (Signed)
Virtual Visit via Video Note   This visit type was conducted due to national recommendations for restrictions regarding the COVID-19 Pandemic (e.g. social distancing) in an effort to limit this patient's exposure and mitigate transmission in our community.  Due to his co-morbid illnesses, this patient is at least at moderate risk for complications without adequate follow up.  This format is felt to be most appropriate for this patient at this time.  All issues noted in this document were discussed and addressed.  A limited physical exam was performed with this format.  Please refer to the patient's chart for his consent to telehealth for [facility].  Of note, there was some difficulty today with the video format.  The patient could see me but I was not able to see him.   Date:  11/29/2019   ID:  [name], DOB 06-Mar-1946, MRN [id number]  Patient Location: Home Provider Location: Home  PCP:  [name], MD  Cardiologist:  [name], MD   Electrophysiologist:  None   Evaluation Performed:  Follow-Up Visit  Chief Complaint:  CAD, hx of AFlutter  History of Present Illness:    [name] is a 73 y.o. male with   Paroxysmal AFlutter  Coronary Ca2+ on chest CT  Hypertension   Hyperlipidemia   PACs/PVCs  S/p lumbar fusion 11/19  He was last seen in February 2020.  Today, he notes he is doing fairly well.  He has not had chest pain, shortness of breath, leg swelling.  He has a lot of back issues and is to have another ESI with Dr. Ellene Route soon. His last lumbar fusion has been slow to heal.      Past Medical History:  Diagnosis Date  . Arthritis   . Complication of anesthesia    FUSion of SKULL thru C7- patient has no movement of head or neck - stiffness from fusion  . Diplopia 07/10/2013  . Diverticulosis of colon (without mention of hemorrhage) 2009   Colonoscopy  . Dyslipidemia   . Dysrhythmia    PAF- not frequently  . HTN (hypertension)   . Hx of  colonic polyps 2009   Colonoscopy(Hyperplastic)  . Obesity   . 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 Paroxysmal atrial flutter [facility])    Past Surgical History:  Procedure Laterality Date  . [place]   2 surgery- skull to C7  . COLON RESECTION  2007  . COLONOSCOPY W/ POLYPECTOMY    . HEMORRHOID SURGERY N/A 01/13/2016   Procedure: HEMORRHOIDECTOMY AND REMOVAL OF ANAL SKIN TAG;  Surgeon: Coralie Keens, MD;  Location: [facility];  Service: General;  Laterality: N/A;  . REPLACEMENT TOTAL KNEE Left 2005   left  . SYNOVIAL CYST EXCISION     lumbar spine  . TONSILLECTOMY    . TOTAL KNEE ARTHROPLASTY Right 01/06/2017   Procedure: RIGHT TOTAL KNEE ARTHROPLASTY;  Surgeon: Sydnee Cabal, MD;  Location: [facility];  Service: Orthopedics;  Laterality: Right;  Adductior Block     Current Meds  Medication Sig  . amLODipine (NORVASC) 5 MG tablet Take 1 tablet (5 mg total) by mouth 2 (two) times daily.  Marland Kitchen aspirin EC 81 MG tablet Take 81 mg by mouth daily.  . baclofen (LIORESAL) 10 MG tablet Take 1 tablet (10 mg total) by mouth every 8 (eight) hours as needed for muscle spasms.  . benazepril (LOTENSIN) 20 MG tablet Take 1 tablet (20 mg total) by mouth 2 (two) times  daily.  . Cholecalciferol (VITAMIN D3) 25 MCG (1000 UT) CAPS Take 1 capsule by mouth daily.  Marland Kitchen glucosamine-chondroitin 500-400 MG tablet Take 1 tablet by mouth daily.  . hydrochlorothiazide (,MICROZIDE/HYDRODIURIL,) 12.5 MG capsule Take 12.5 mg by mouth daily.    Marland Kitchen lidocaine (XYLOCAINE) 5 % ointment Apply 1 application topically 3 (three) times daily as needed. For pain  . metoprolol tartrate (LOPRESSOR) 25 MG tablet Take 25 mg by mouth as needed.  . Multiple Vitamin (MULTIVITAMIN) tablet Take 1 tablet by mouth daily.  Marland Kitchen oxyCODONE (ROXICODONE) 5 MG immediate release tablet Take 1-2 tablets (5-10 mg total) by mouth every 4 (four) hours as needed for severe pain or breakthrough pain.  . rosuvastatin (CRESTOR) 5 MG tablet  Take 1 tablet by mouth daily.  . vitamin B-12 (CYANOCOBALAMIN) 500 MCG tablet Take 500 mcg by mouth daily.  . vitamin C (ASCORBIC ACID) 500 MG tablet Take 500 mg by mouth daily.  . Zinc 50 MG TABS Take 1 tablet by mouth daily.  . [DISCONTINUED] rosuvastatin (CRESTOR) 5 MG tablet Take 1 tablet (5 mg total) by mouth daily.     Allergies:   Tamsulosin hcl and Niacin   Social History   Tobacco Use  . Smoking status: Never Smoker  . Smokeless tobacco: Never Used  Substance Use Topics  . Alcohol use: No  . Drug use: No     Family Hx: The patient's family history includes Coronary artery disease in his father and mother. There is no history of Colon cancer or Stomach cancer.  ROS:   Please see the history of present illness.     All other systems reviewed and are negative.   Prior CV studies:   The following studies were reviewed today:  Myoview 10/31/17 EF 50, no ischemia; Low Risk  Chest CTA 08/29/17 FINDINGS: Cardiovascular: Satisfactory opacification of the bilateral pulmonary arteries to the segmental level. No evidence of pulmonary embolism. No evidence of thoracic aortic aneurysm or dissection. The heart is normal in size. No pericardial effusion. Coronary atherosclerosis the LAD and right coronary artery. Mediastinum/Nodes: No suspicious mediastinal lymphadenopathy. 1.8 cm short axis right axillary node (series 4/image 30), with additional smaller bilateral axillary nodes which are mildly prominent but nonenlarged. Lungs/Pleura: Mild lingular scarring/ atelectasis. No focal consolidation. No suspicious pulmonary nodules. 5 mm subpleural nodule in the lateral right middle lobe (series 6/image 57), likely reflecting a benign subpleural lymph node. No pleural effusion or pneumothorax. Upper Abdomen: Visualized upper abdomen is unremarkable. Musculoskeletal: Degenerative changes of the visualized thoracolumbar spine. Review of the MIP images confirms the above  findings.  IMPRESSION: No evidence of pulmonary embolism. 1.8 cm short axis right axillary node, indeterminate. Additional mildly prominent bilateral axillary nodes which are not pathologically enlarged. Overall, reactive lymph nodes are favored. However, if this finding persists clinically, consider percutaneous sampling of the dominant right axillary node.   Event Monitor 08/24/17 Sinus rhythm with occasional PAC No arrhythmias detected  Echo 06/15/16 EF 55-60, no RWMA, normal diastolic function, trivial MR, normal RVSF, PASP 32  24 Hr Holter 06/13/16 Sinus rhythm. HR 39 to 142 bpm Average HR 59 bpm  Frequent PVC (8.7% total), Frequent PAC.  One burst NSVT (9 beats) No diary entries   Echo 08/01/14 EF 55-60, Gr 2 DD, mild MR, mod LAE   Labs/Other Tests and Data Reviewed:    EKG:  No ECG reviewed.  Recent Labs: No results found for requested labs within last 8760 hours.   Recent Lipid Panel Lab  Results  Component Value Date/Time   CHOL 152 08/10/2017 09:37 AM   TRIG 80 08/10/2017 09:37 AM   HDL 31 (L) 08/10/2017 09:37 AM   CHOLHDL 4.9 08/10/2017 09:37 AM   CHOLHDL 4.4 09/05/2016 10:29 AM   LDLCALC 105 (H) 08/10/2017 09:37 AM     Wt Readings from Last 3 Encounters:  11/29/19 259 lb (117.5 kg)  01/29/19 256 lb 12.8 oz (116.5 kg)  06/12/18 257 lb (116.6 kg)     Objective:    Vital Signs:  BP 132/68   Pulse (!) 59   Ht 5\' 11"  ([id number] m)   Wt 259 lb (117.5 kg)   BMI 36.12 kg/m    VITAL SIGNS:  reviewed GEN:  no acute distress RESPIRATORY:  no labored breathing noted NEURO:  alert and oriented PSYCH:  normal mood  ASSESSMENT & PLAN:    1. Paroxysmal atrial flutter (HCC) No apparent recurrence.  He uses prn Metoprolol for palpitations but has not had to use it in a long time.   2. Coronary artery calcification seen on CT scan Calcification noted on prior CT.  Myoview in 10/2017 was low risk.  He is not having anginal symptoms.  Continue ASA,  statin.  ASA currently on hold for ESI.    3. Essential hypertension The patient's blood pressure is controlled on his current regimen.  Continue current therapy.    4. Dyslipidemia LDL at goal.  Triglycerides above goal.  We discussed managing diet to help reduce Triglycerides.  If his level remains > 150, we could add Fish Oil to help reduce him to goal.      Time:   Today, I have spent 15 minutes with the patient with telehealth technology discussing the above problems.     Medication Adjustments/Labs and Tests Ordered: Current medicines are reviewed at length with the patient today.  Concerns regarding medicines are outlined above.   Tests Ordered: No orders of the defined types were placed in this encounter.   Medication Changes: No orders of the defined types were placed in this encounter.   Follow Up:  In Person in 6 month(s)  Signed, Richardson Dopp, PA-C  11/29/2019 1:40 PM    [facility]

## 2019-11-29 ENCOUNTER — Other Ambulatory Visit: Payer: Self-pay

## 2019-11-29 ENCOUNTER — Telehealth (INDEPENDENT_AMBULATORY_CARE_PROVIDER_SITE_OTHER): Payer: Medicare Other | Admitting: Physician Assistant

## 2019-11-29 ENCOUNTER — Encounter: Payer: Self-pay | Admitting: Physician Assistant

## 2019-11-29 VITALS — BP 132/68 | HR 59 | Ht 71.0 in | Wt 259.0 lb

## 2019-11-29 DIAGNOSIS — I10 Essential (primary) hypertension: Secondary | ICD-10-CM | POA: Diagnosis not present

## 2019-11-29 DIAGNOSIS — I2510 Atherosclerotic heart disease of native coronary artery without angina pectoris: Secondary | ICD-10-CM | POA: Diagnosis not present

## 2019-11-29 DIAGNOSIS — I4892 Unspecified atrial flutter: Secondary | ICD-10-CM

## 2019-11-29 DIAGNOSIS — E785 Hyperlipidemia, unspecified: Secondary | ICD-10-CM | POA: Diagnosis not present

## 2019-11-29 NOTE — Patient Instructions (Signed)
Medication Instructions:  Your physician recommends that you continue on your current medications as directed. Please refer to the Current Medication list given to you today.  *If you need a refill on your cardiac medications before your next appointment, please call your pharmacy*  Lab Work:  None ordered today  Testing/Procedures:  None ordered today  Follow-Up: At [facility], you and your health needs are our priority.  As part of our continuing mission to provide you with exceptional heart care, we have created designated Provider Care Teams.  These Care Teams include your primary Cardiologist (physician) and Advanced Practice Providers (APPs -  Physician Assistants and Nurse Practitioners) who all work together to provide you with the care you need, when you need it.  Your next appointment:   6 month(s)  The format for your next appointment:   In Person  Provider:   [name], MD  Other Instructions      Fat and Cholesterol Restricted Eating Plan Getting too much fat and cholesterol in your diet may cause health problems. Choosing the right foods helps keep your fat and cholesterol at normal levels. This can keep you from getting certain diseases. Your doctor may recommend an eating plan that includes:  Total fat: ______% or less of total calories a day.  Saturated fat: ______% or less of total calories a day.  Cholesterol: less than _________mg a day.  Fiber: ______g a day. What are tips for following this plan? Meal planning  At meals, divide your plate into four equal parts: ? Fill one-half of your plate with vegetables and green salads. ? Fill one-fourth of your plate with whole grains. ? Fill one-fourth of your plate with low-fat (lean) protein foods.  Eat fish that is high in omega-3 fats at least two times a week. This includes mackerel, tuna, sardines, and salmon.  Eat foods that are high in fiber, such as whole grains, beans, apples, broccoli,  carrots, peas, and barley. General tips   Work with your doctor to lose weight if you need to.  Avoid: ? Foods with added sugar. ? Fried foods. ? Foods with partially hydrogenated oils.  Limit alcohol intake to no more than 1 drink a day for nonpregnant women and 2 drinks a day for men. One drink equals 12 oz of beer, 5 oz of wine, or 1 oz of hard liquor. Reading food labels  Check food labels for: ? Trans fats. ? Partially hydrogenated oils. ? Saturated fat (g) in each serving. ? Cholesterol (mg) in each serving. ? Fiber (g) in each serving.  Choose foods with healthy fats, such as: ? Monounsaturated fats. ? Polyunsaturated fats. ? Omega-3 fats.  Choose grain products that have whole grains. Look for the word "whole" as the first word in the ingredient list. Cooking  Cook foods using low-fat methods. These include baking, boiling, grilling, and broiling.  Eat more home-cooked foods. Eat at restaurants and buffets less often.  Avoid cooking using saturated fats, such as butter, cream, palm oil, palm kernel oil, and coconut oil. Recommended foods  Fruits  All fresh, canned (in natural juice), or frozen fruits. Vegetables  Fresh or frozen vegetables (raw, steamed, roasted, or grilled). Green salads. Grains  Whole grains, such as whole wheat or whole grain breads, crackers, cereals, and pasta. Unsweetened oatmeal, bulgur, barley, quinoa, or brown rice. Corn or whole wheat flour tortillas. Meats and other protein foods  Ground beef (85% or leaner), grass-fed beef, or beef trimmed of fat. Skinless chicken  or [place]. Ground chicken or [place]. Pork trimmed of fat. All fish and seafood. Egg whites. Dried beans, peas, or lentils. Unsalted nuts or seeds. Unsalted canned beans. Nut butters without added sugar or oil. Dairy  Low-fat or nonfat dairy products, such as skim or 1% milk, 2% or reduced-fat cheeses, low-fat and fat-free ricotta or cottage cheese, or plain low-fat and  nonfat yogurt. Fats and oils  Tub margarine without trans fats. Light or reduced-fat mayonnaise and salad dressings. Avocado. Olive, canola, sesame, or safflower oils. The items listed above may not be a complete list of foods and beverages you can eat. Contact a dietitian for more information. Foods to avoid Fruits  Canned fruit in heavy syrup. Fruit in cream or butter sauce. Fried fruit. Vegetables  Vegetables cooked in cheese, cream, or butter sauce. Fried vegetables. Grains  White bread. White pasta. White rice. Cornbread. Bagels, pastries, and croissants. Crackers and snack foods that contain trans fat and hydrogenated oils. Meats and other protein foods  Fatty cuts of meat. Ribs, chicken wings, bacon, sausage, bologna, salami, chitterlings, fatback, hot dogs, bratwurst, and packaged lunch meats. Liver and organ meats. Whole eggs and egg yolks. Chicken and [place] with skin. Fried meat. Dairy  Whole or 2% milk, cream, half-and-half, and cream cheese. Whole milk cheeses. Whole-fat or sweetened yogurt. Full-fat cheeses. Nondairy creamers and whipped toppings. Processed cheese, cheese spreads, and cheese curds. Beverages  Alcohol. Sugar-sweetened drinks such as sodas, lemonade, and fruit drinks. Fats and oils  Butter, stick margarine, lard, shortening, ghee, or bacon fat. Coconut, palm kernel, and palm oils. Sweets and desserts  Corn syrup, sugars, honey, and molasses. Candy. Jam and jelly. Syrup. Sweetened cereals. Cookies, pies, cakes, donuts, muffins, and ice cream. The items listed above may not be a complete list of foods and beverages you should avoid. Contact a dietitian for more information. Summary  Choosing the right foods helps keep your fat and cholesterol at normal levels. This can keep you from getting certain diseases.  At meals, fill one-half of your plate with vegetables and green salads.  Eat high-fiber foods, like whole grains, beans, apples, carrots, peas,  and barley.  Limit added sugar, saturated fats, alcohol, and fried foods. This information is not intended to replace advice given to you by your health care provider. Make sure you discuss any questions you have with your health care provider. Document Released: 06/05/2012 Document Revised: 08/08/2018 Document Reviewed: 08/22/2017 Elsevier Patient Education  2020 [employer].

## 2019-12-02 ENCOUNTER — Other Ambulatory Visit: Payer: Self-pay | Admitting: Neurological Surgery

## 2019-12-02 ENCOUNTER — Other Ambulatory Visit: Payer: Self-pay | Admitting: *Deleted

## 2019-12-02 DIAGNOSIS — M5416 Radiculopathy, lumbar region: Secondary | ICD-10-CM | POA: Diagnosis not present

## 2019-12-02 DIAGNOSIS — Z981 Arthrodesis status: Secondary | ICD-10-CM | POA: Diagnosis not present

## 2019-12-02 DIAGNOSIS — M5117 Intervertebral disc disorders with radiculopathy, lumbosacral region: Secondary | ICD-10-CM | POA: Diagnosis not present

## 2019-12-04 ENCOUNTER — Encounter: Payer: Self-pay | Admitting: Vascular Surgery

## 2019-12-24 ENCOUNTER — Encounter: Payer: Self-pay | Admitting: Vascular Surgery

## 2019-12-24 ENCOUNTER — Other Ambulatory Visit: Payer: Self-pay

## 2019-12-24 ENCOUNTER — Ambulatory Visit (INDEPENDENT_AMBULATORY_CARE_PROVIDER_SITE_OTHER): Payer: Medicare Other | Admitting: Vascular Surgery

## 2019-12-24 VITALS — BP 157/79 | HR 74 | Temp 98.1°F | Resp 20 | Ht 71.0 in | Wt 259.0 lb

## 2019-12-24 DIAGNOSIS — M5137 Other intervertebral disc degeneration, lumbosacral region: Secondary | ICD-10-CM

## 2019-12-24 NOTE — Progress Notes (Signed)
[facility]ist of [place]  Patient name: [name] MRN: [id number] DOB: [birth year] Sex: male  REASON FOR CONSULT: Discuss access for L5-S1 disc fusion  HPI: [name] is a 74 y.o. male, who is here today for discussion of anterior approach for L5-S1 disc fusion with Dr. Ellene Route.  He has a long history of cervical and lumbar disc surgery.  The has a constant pain associated with his back.  Reports that he used to walk 10 miles every day and now has difficulty getting from our exam room to his truck out front.  Has had some response to injections and minimal response to other treatment options.  Does have a history of diverticular disease with colon resection via laparoscopic approach in 2007.  No other abdominal surgery.  Past Medical History:  Diagnosis Date  . Arthritis   . Complication of anesthesia    FUSion of SKULL thru C7- patient has no movement of head or neck - stiffness from fusion  . Diplopia 07/10/2013  . Diverticulosis of colon (without mention of hemorrhage) 2009   Colonoscopy  . Dyslipidemia   . Dysrhythmia    PAF- not frequently  . HTN (hypertension)   . Hx of colonic polyps 2009   Colonoscopy(Hyperplastic)  . Obesity   . 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 Paroxysmal atrial flutter (HCC)     Family History  Problem Relation Age of Onset  . Coronary artery disease Mother   . Coronary artery disease Father   . Colon cancer Neg Hx   . Stomach cancer Neg Hx     SOCIAL HISTORY: Social History   Socioeconomic History  . Marital status: Married    Spouse name: Zigmund Daniel  . Number of children: 4  . Years of education: 48  . Highest education level: Not on file  Occupational History  . Occupation: Retired    Fish farm manager: RETIRED  Tobacco Use  . Smoking status: Never Smoker  . Smokeless tobacco: Never Used  Substance and Sexual Activity  . Alcohol use: No  . Drug use: No  . Sexual activity: Not  on file  Other Topics Concern  . Not on file  Social History Narrative   Patient lives at home with Wife Zigmund Daniel.    Patient has 4 children.    Patient is currently not working. He is retired and disabled.    Patient has a high school education.    Social Determinants of Health   Financial Resource Strain:   . Difficulty of Paying Living Expenses: Not on file  Food Insecurity:   . Worried About Charity fundraiser in the Last Year: Not on file  . Ran Out of Food in the Last Year: Not on file  Transportation Needs:   . Lack of Transportation (Medical): Not on file  . Lack of Transportation (Non-Medical): Not on file  Physical Activity:   . Days of Exercise per Week: Not on file  . Minutes of Exercise per Session: Not on file  Stress:   . Feeling of Stress : Not on file  Social Connections:   . Frequency of Communication with Friends and Family: Not on file  . Frequency of Social Gatherings with Friends and Family: Not on file  . Attends Religious Services: Not on file  . Active Member of Clubs or Organizations: Not on file  . Attends Archivist Meetings: Not on file  . Marital Status: Not on file  Intimate Partner  Violence:   . Fear of Current or Ex-Partner: Not on file  . Emotionally Abused: Not on file  . Physically Abused: Not on file  . Sexually Abused: Not on file    Allergies  Allergen Reactions  . Tamsulosin Hcl Other (See Comments)    Orthostatic hypotension  . Niacin Diarrhea    Current Outpatient Medications  Medication Sig Dispense Refill  . amLODipine (NORVASC) 5 MG tablet Take 1 tablet (5 mg total) by mouth 2 (two) times daily. 180 tablet 3  . aspirin EC 81 MG tablet Take 81 mg by mouth daily.    . baclofen (LIORESAL) 10 MG tablet Take 1 tablet (10 mg total) by mouth every 8 (eight) hours as needed for muscle spasms. 50 each 2  . benazepril (LOTENSIN) 20 MG tablet Take 1 tablet (20 mg total) by mouth 2 (two) times daily. 180 tablet 3  .  Cholecalciferol (VITAMIN D3) 25 MCG (1000 UT) CAPS Take 1 capsule by mouth daily.    Marland Kitchen glucosamine-chondroitin 500-400 MG tablet Take 1 tablet by mouth daily.    . hydrochlorothiazide (,MICROZIDE/HYDRODIURIL,) 12.5 MG capsule Take 12.5 mg by mouth daily.      Marland Kitchen lidocaine (XYLOCAINE) 5 % ointment Apply 1 application topically 3 (three) times daily as needed. For pain 35.44 g 1  . metoprolol tartrate (LOPRESSOR) 25 MG tablet Take 25 mg by mouth as needed.    . Multiple Vitamin (MULTIVITAMIN) tablet Take 1 tablet by mouth daily.    Marland Kitchen oxyCODONE (ROXICODONE) 5 MG immediate release tablet Take 1-2 tablets (5-10 mg total) by mouth every 4 (four) hours as needed for severe pain or breakthrough pain. 60 tablet 0  . rosuvastatin (CRESTOR) 5 MG tablet Take 1 tablet by mouth daily.    . vitamin B-12 (CYANOCOBALAMIN) 500 MCG tablet Take 500 mcg by mouth daily.    . vitamin C (ASCORBIC ACID) 500 MG tablet Take 500 mg by mouth daily.    . Zinc 50 MG TABS Take 1 tablet by mouth daily.     No current facility-administered medications for this visit.    REVIEW OF SYSTEMS:  [X]  denotes positive finding, [ ]  denotes negative finding Cardiac  Comments:  Chest pain or chest pressure:    Shortness of breath upon exertion:    Short of breath when lying flat:    Irregular heart rhythm:        Vascular    Pain in calf, thigh, or hip brought on by ambulation:    Pain in feet at night that wakes you up from your sleep:     Blood clot in your veins:    Leg swelling:         Pulmonary    Oxygen at home:    Productive cough:     Wheezing:         Neurologic    Sudden weakness in arms or legs:     Sudden numbness in arms or legs:     Sudden onset of difficulty speaking or slurred speech:    Temporary loss of vision in one eye:     Problems with dizziness:         Gastrointestinal    Blood in stool:     Vomited blood:         Genitourinary    Burning when urinating:     Blood in urine:          Psychiatric    Major depression:  Hematologic    Bleeding problems:    Problems with blood clotting too easily:        Skin    Rashes or ulcers:        Constitutional    Fever or chills:      PHYSICAL EXAM: Vitals:   12/24/19 1457  BP: (!) 157/79  Pulse: 74  Resp: 20  Temp: 98.1 F (36.7 C)  SpO2: 95%  Weight: 259 lb (117.5 kg)  Height: 5\' 11"  (1.803 m)    GENERAL: The patient is a well-nourished male, in no acute distress. The vital signs are documented above. CARDIOVASCULAR: 2+ radial and 2+ dorsalis pedis pulses bilaterally PULMONARY: There is good air exchange  ABDOMEN: Soft and non-tender  MUSCULOSKELETAL: There are no major deformities or cyanosis. NEUROLOGIC: No focal weakness or paresthesias are detected. SKIN: There are no ulcers or rashes noted. PSYCHIATRIC: The patient has a normal affect.  DATA:  Lumbar CT scan was reviewed.  This shows mild aortic and iliac calcification.  Normal location of aortic bifurcation  MEDICAL ISSUES: I discussed my role in anterior exposure for L5-S1 disc fusion with Dr. Ellene Route.  Explained mobilization of the rectus muscle, ureter, intra abdominal contents and arterial and venous structures overlying the spine.  He has had prior laparoscopic sigmoid resection for diverticular disease explained that this may make dissection somewhat more difficult but not prohibitive.  He is not obese and does not have severe atherosclerotic change to his aortoiliac segments.  We'll plan surgery as scheduled for 01/06/2020   Rosetta Posner, MD [facility] Tel [phone] Pager [phone]

## 2019-12-30 NOTE — Progress Notes (Signed)
[facility] - [place],  - [address] [place] [address] Phone: [phone] Fax: [phone]      Your procedure is scheduled on Monday 01/06/2020.  Report to [facility] Main Entrance "A" at 05:30 A.M., and check in at the Admitting office.  Call this number if you have problems the morning of surgery:  [phone]  Call [phone] if you have any questions prior to your surgery date Monday-Friday 8am-4pm    Remember:  Do not eat or drink after midnight the night before your surgery     Take these medicines the morning of surgery with A SIP OF WATER: Amlodipine (Norvasc) Metoprolol tartate (Lopressor) - if needed Oxycodone (Roxicodone) - if needed Scopolamine (Transderm-scop) - if needed  7 days prior to surgery STOP taking any Aspirin (unless otherwise instructed by your surgeon), Aleve, Naproxen, Ibuprofen, Motrin, Advil, Goody's, BC's, all herbal medications, fish oil, and all vitamins.  Follow your surgeon's instructions on when to stop Aspirin.  If no instructions were given by your surgeon then you will need to call the office to get those instructions.       The Morning of Surgery  Do not wear jewelry.  Do not wear lotions, powders, colognes, or deodorant  Men may shave face and neck.  Do not bring valuables to the hospital.  [facility] is not responsible for any belongings or valuables.  If you are a smoker, DO NOT Smoke 24 hours prior to surgery  If you wear a CPAP at night please bring your mask, tubing, and machine the morning of surgery   Remember that you must have someone to transport you home after your surgery, and remain with you for 24 hours if you are discharged the same day.   Please bring cases for contacts, glasses, hearing aids, dentures or bridgework because it cannot be worn into surgery.    Leave your suitcase in the car.  After surgery it may be brought to your  room.  For patients admitted to the hospital, discharge time will be determined by your treatment team.  Patients discharged the day of surgery will not be allowed to drive home.    Special instructions:   [place]- Preparing For Surgery  Before surgery, you can play an important role. Because skin is not sterile, your skin needs to be as free of germs as possible. You can reduce the number of germs on your skin by washing with CHG (chlorahexidine gluconate) Soap before surgery.  CHG is an antiseptic cleaner which kills germs and bonds with the skin to continue killing germs even after washing.    Oral Hygiene is also important to reduce your risk of infection.  Remember - BRUSH YOUR TEETH THE MORNING OF SURGERY WITH YOUR REGULAR TOOTHPASTE  Please do not use if you have an allergy to CHG or antibacterial soaps. If your skin becomes reddened/irritated stop using the CHG.  Do not shave (including legs and underarms) for at least 48 hours prior to first CHG shower. It is OK to shave your face.  Please follow these instructions carefully.   1. Shower the NIGHT BEFORE SURGERY and the MORNING OF SURGERY with CHG Soap.   2. If you chose to wash your hair, wash your hair first as usual with your normal shampoo.  3. After you shampoo, rinse your hair and body thoroughly to remove the shampoo.  4. Use CHG as you would any other  liquid soap. You can apply CHG directly to the skin and wash gently with a scrungie or a clean washcloth.   5. Apply the CHG Soap to your body ONLY FROM THE NECK DOWN.  Do not use on open wounds or open sores. Avoid contact with your eyes, ears, mouth and genitals (private parts). Wash Face and genitals (private parts)  with your normal soap.   6. Wash thoroughly, paying special attention to the area where your surgery will be performed.  7. Thoroughly rinse your body with warm water from the neck down.  8. DO NOT shower/wash with your normal soap after using and  rinsing off the CHG Soap.  9. Pat yourself dry with a CLEAN TOWEL.  10. Wear CLEAN PAJAMAS to bed the night before surgery, wear comfortable clothes the morning of surgery  11. Place CLEAN SHEETS on your bed the night of your first shower and DO NOT SLEEP WITH PETS.    Day of Surgery:  Please shower the morning of surgery with the CHG soap Do not apply any deodorants/lotions. Please wear clean clothes to the hospital/surgery center.   Remember to brush your teeth WITH YOUR REGULAR TOOTHPASTE.   Please read over the following fact sheets that you were given.

## 2019-12-31 ENCOUNTER — Other Ambulatory Visit: Payer: Self-pay

## 2019-12-31 ENCOUNTER — Encounter (HOSPITAL_COMMUNITY)
Admission: RE | Admit: 2019-12-31 | Discharge: 2019-12-31 | Disposition: A | Discharge status: Home / Self Care | Payer: Medicare Other | Source: Ambulatory Visit | Attending: Neurological Surgery | Admitting: Neurological Surgery

## 2019-12-31 ENCOUNTER — Encounter (HOSPITAL_COMMUNITY): Payer: Self-pay

## 2019-12-31 DIAGNOSIS — Z01812 Encounter for preprocedural laboratory examination: Secondary | ICD-10-CM | POA: Diagnosis not present

## 2019-12-31 LAB — TYPE AND SCREEN
ABO/RH(D): O NEG
Antibody Screen: NEGATIVE

## 2019-12-31 LAB — BASIC METABOLIC PANEL
Anion gap: 7 (ref 5–15)
BUN: 22 mg/dL (ref 8–23)
CO2: 25 mmol/L (ref 22–32)
Calcium: 9.4 mg/dL (ref 8.9–10.3)
Chloride: 104 mmol/L (ref 98–111)
Creatinine, Ser: 1.12 mg/dL (ref 0.61–1.24)
GFR calc Af Amer: >60 mL/min (ref >60)
GFR calc non Af Amer: >60 mL/min (ref >60)
Glucose, Bld: 109 mg/dL — ABNORMAL HIGH (ref 70–99)
Potassium: 4.5 mmol/L (ref 3.5–5.1)
Sodium: 136 mmol/L (ref 135–145)

## 2019-12-31 LAB — CBC
HCT: 43 % (ref 39.0–52.0)
Hemoglobin: 14.1 g/dL (ref 13.0–17.0)
MCH: 32.6 pg (ref 26.0–34.0)
MCHC: 32.8 g/dL (ref 30.0–36.0)
MCV: 99.5 fL (ref 80.0–100.0)
Platelets: 159 10*3/uL (ref 150–400)
RBC: 4.32 MIL/uL (ref 4.22–5.81)
RDW: 13.9 % (ref 11.5–15.5)
WBC: 6.9 10*3/uL (ref 4.0–10.5)
nRBC: 0 % (ref 0.0–0.2)

## 2019-12-31 LAB — ABO/RH: ABO/RH(D): O NEG

## 2019-12-31 LAB — SURGICAL PCR SCREEN
MRSA, PCR: NEGATIVE
Staphylococcus aureus: NEGATIVE

## 2019-12-31 NOTE — Progress Notes (Signed)
PCP - Benita Stabile, MD Cardiologist - Wyatt Haste, MD  Chest x-ray - n/a EKG - 01/29/2019 Stress Test - 06/15/2016 ECHO - 10/31/2017 Cardiac Cath -   Sleep Study - yes- no sleep apnea   Aspirin Instructions: per pt last dose 12/30/2019  COVID TEST- 01/02/2020   Anesthesia review: yes  Patient denies shortness of breath, fever, cough and chest pain at PAT appointment   All instructions explained to the patient, with a verbal understanding of the material. Patient agrees to go over the instructions while at home for a better understanding. Patient also instructed to self quarantine after being tested for COVID-19. The opportunity to ask questions was provided.

## 2020-01-01 NOTE — Anesthesia Preprocedure Evaluation (Addendum)
Anesthesia Evaluation  Patient identified by MRN, date of birth, ID band Patient awake    Reviewed: Allergy & Precautions, NPO status , Patient's Chart, lab work & pertinent test results  Airway Mallampati: II  TM Distance: >3 FB Neck ROM: Limited    Dental no notable dental hx.    Pulmonary neg pulmonary ROS,    Pulmonary exam normal breath sounds clear to auscultation       Cardiovascular hypertension, Normal cardiovascular exam+ dysrhythmias Atrial Fibrillation  Rhythm:Regular Rate:Normal     Neuro/Psych negative neurological ROS  negative psych ROS   GI/Hepatic negative GI ROS, Neg liver ROS,   Endo/Other  negative endocrine ROS  Renal/GU negative Renal ROS  negative genitourinary   Musculoskeletal negative musculoskeletal ROS (+)   Abdominal   Peds negative pediatric ROS (+)  Hematology negative hematology ROS (+)   Anesthesia Other Findings   Reproductive/Obstetrics negative OB ROS                           Anesthesia Physical Anesthesia Plan  ASA: III  Anesthesia Plan: General   Post-op Pain Management:    Induction: Intravenous  PONV Risk Score and Plan: 2 and Ondansetron, Dexamethasone and Treatment may vary due to age or medical condition  Airway Management Planned: Oral ETT and Video Laryngoscope Planned  Additional Equipment:   Intra-op Plan:   Post-operative Plan: Extubation in OR  Informed Consent: I have reviewed the patients History and Physical, chart, labs and discussed the procedure including the risks, benefits and alternatives for the proposed anesthesia with the patient or authorized representative who has indicated his/her understanding and acceptance.     Dental advisory given  Plan Discussed with: CRNA and Surgeon  Anesthesia Plan Comments: (See PAT note written by Myra Gianotti, PA-C. Potential for difficult airway given history of limited neck  mobility due to occiput to C7 fusion. Glidescope 3 used to place 7.5 ETT for 11/12/18 lumbar fusion at [facility].     )      Anesthesia Quick Evaluation

## 2020-01-01 NOTE — Progress Notes (Addendum)
Anesthesia Chart Review:  Case: [id number] Date/Time: 01/06/20 0715   Procedures:      Lumbar 5 Sacral 1 Anterior lumbar interbody fusion with infuse (N/A ) - Lumbar 5 Sacral 1 Anterior lumbar interbody fusion with infuse     ABDOMINAL EXPOSURE (N/A )   Anesthesia type: General   Pre-op diagnosis: Unspecified fracture of lumbar vertebra; Pseudoarthrosis   Location: [facility] OR ROOM 20 / [place] OR   Surgeons: Kristeen Miss, MD; Early, Arvilla Meres, MD      DISCUSSION: Patient is a 74 year old male scheduled for the above procedure.  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 Glenna Fellows). BMI is consistent with obesity.  Potential for DIFFICULT AIRWAY given limited neck mobility due to fusion of occiput to C7. (By 05/21/14 CT C-spine [facility] Everywhere, "There has been prior interbody fusions at C4-C5 and C5-C6 C6-C7. The appear fairly solid fused. No cervical hardware is currently in place anteriorly. There is  also been fusion posteriorly at C1, C2, C3 and the occiput with wires short rods. Is also solid bone across these structures." 11/12/18 anesthesia records obtained from [facility]. Notes indicate that orotracheal 7.5 ETT placed using glidescope 3, 1 attempt, atraumatic.  Last cardiology evaluation on 11/29/19 by Richardson Dopp, PA-C. No apparent recurrence of paroxysmal aflutter. Continue metoprolol PRN palpitations. Low risk Myoview in 10/2017 (done following coronary calcifications on 08/2017 CT). No angina or SOB. No new testing recommended, and six month follow-up planned. Patient reported last ASA 12/30/19.   Presurgical COVID-19 test scheduled for 01/02/20.  If negative and otherwise no acute changes  then I would anticipate that he may proceed as planned.   VS: BP 139/76   Pulse 85   Temp 37.2 C (Oral)   Resp 18   Ht 5\' 11"  (1.803 m)   Wt 115.8 kg   SpO2 97%   BMI 35.59 kg/m    PROVIDERS: [name], MD is PCP [name], MD is cardiologist   LABS: Labs reviewed: Acceptable for surgery. (all labs ordered are listed, but only abnormal results are displayed)  Labs Reviewed  BASIC METABOLIC PANEL - Abnormal; Notable for the following components:      Result Value   Glucose, Bld 109 (*)    All other components within normal limits  SURGICAL PCR SCREEN  CBC  TYPE AND SCREEN  ABO/RH    IMAGES: CT L-spine 08/12/19: IMPRESSION: - Since the prior CT, the patient has undergone L5-S1 fusion. Lucency about both S1 screws consistent with loosening is worse on the right. The Interbody spacer is positioned within the right side of the disc interspace and there is no bridging bone across the interspace or facets. Vacuum disc phenomenon is also present compatible with motion. The central canal is open. Mild to moderate foraminal narrowing appears worse on the right. - Status post L3-5 fusion as seen on the prior study. The levels are fused and there is no stenosis. - No change in degenerative disease at L1-2 and L2-3 which appears worst at L2-3 where there is severe left foraminal narrowing due to facet arthropathy and disc and moderate central canal stenosis.  CT C-spine 05/21/14 [facility] Everywhere); - Coronally reformatted images show cervicothoracic curvature convex to the right. There has been prior interbody fusions at  C4-C5 and C5-C6 C6-C7. The appear fairly solid fused. No cervical hardware is currently in place anteriorly. There is also been fusion posteriorly at C1, C2, C3 and the occiput with wires short rods. Is also solid bone across these structures. Slight kyphotic angulation at T1-T2. Moderate spondylosis with facet arthropathy uncovertebral disease.  Calcification in the nuchal ligament.  - IMPRESSION:  Prior posterior cervical fusion and interbody fusion in the mid and lower cervical spine. Residual bony ridging most lower cervical spine with mild central stenosis. Multilevel foraminal stenosis from facet and uncovertebral hypertrophy. No acute process.    EKG: 01/29/19: Sinus rhythm with premature atrial complexes Otherwise normal ECG   CV: Nuclear stress test 10/31/17:  Nuclear stress EF: 50%.  There was no ST segment deviation noted during stress.  The study is normal.  This is a low risk study.  The left ventricular ejection fraction is mildly decreased (45-54%).  No ischemia.    Cardiac event monitor 08/24/17-09/22/17: Study Highlights - Sinus rhythm with occasional PAC - No arrhythmias detected   Echo 06/15/16: Study Conclusions - Left ventricle: The cavity size was normal. Wall thickness was   normal. Systolic function was normal. The estimated ejection   fraction was in the range of 55% to 60%. Wall motion was normal;   there were no regional wall motion abnormalities. Left   ventricular diastolic function parameters were normal. - Aortic valve: There was no stenosis. - Mitral valve: There was trivial regurgitation. - Right ventricle: The cavity size was normal. Systolic function   was normal. - Tricuspid valve: Peak RV-RA gradient (S): 29 mm Hg. - Pulmonary arteries: PA peak pressure: 32 mm Hg (S). - Inferior vena cava: The vessel was normal in size. The   respirophasic diameter changes were in the normal range (= 50%),   consistent with normal central venous pressure. Impressions: - Normal LV size with EF 55-60%. Normal diastolic function. Normal   RV size and systolic function. No significant valvular   abnormalities.   24 Hour Holter monitor 06/13/16: Study Highlight Sinus rhythm.  HR 39 to 142 bpm  Average HR 59 bpm   Frequent PVC (8.7% total), Frequent PAC.   One burst NSVT (9 beats) No  diary entries   - Holter monitor ordered for finding of bigeminy PVCs during ED evaluation for weakness and bradycardia (likely from non-perfusing PVCs). He thought me might have had an intestinal virus. Symptoms resolved and no changes made.   Past Medical History:  Diagnosis Date  . Arthritis   . Complication of anesthesia    FUSion of SKULL thru C7- patient has no movement of head or neck - stiffness from fusion  . Diplopia 07/10/2013  . Diverticulosis of colon (without mention of hemorrhage) 2009   Colonoscopy  . Dyslipidemia   . Dysrhythmia    PAF- not frequently  . HTN (hypertension)   . Hx of colonic polyps 2009   Colonoscopy(Hyperplastic)  . Obesity   . 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 Paroxysmal atrial flutter [facility])     Past Surgical History:  Procedure Laterality Date  . [place]   2 surgery- skull to C7  . COLON RESECTION  2007  . COLONOSCOPY W/ POLYPECTOMY    . HEMORRHOID SURGERY N/A 01/13/2016   Procedure: HEMORRHOIDECTOMY AND REMOVAL OF ANAL SKIN TAG;  Surgeon: Coralie Keens, MD;  Location: [place];  Service: General;  Laterality: N/A;  . REPLACEMENT TOTAL KNEE Left 2005   left  .  SYNOVIAL CYST EXCISION     lumbar spine  . TONSILLECTOMY    . TOTAL KNEE ARTHROPLASTY Right 01/06/2017   Procedure: RIGHT TOTAL KNEE ARTHROPLASTY;  Surgeon: Sydnee Cabal, MD;  Location: [facility];  Service: Orthopedics;  Laterality: Right;  Adductior Block    MEDICATIONS: . amLODipine (NORVASC) 5 MG tablet  . Ascorbic Acid (VITAMIN C) 1000 MG tablet  . aspirin EC 81 MG tablet  . B Complex-C (B-COMPLEX WITH VITAMIN C) tablet  . benazepril (LOTENSIN) 20 MG tablet  . Cholecalciferol (VITAMIN D3) 25 MCG (1000 UT) CAPS  . Cyanocobalamin (VITAMIN B-12 PO)  . Glucosamine-Chondroitin (COSAMIN DS PO)  . hydrochlorothiazide (,MICROZIDE/HYDRODIURIL,) 12.5 MG capsule  . metoprolol tartrate (LOPRESSOR) 25 MG tablet  . Multiple Vitamin (MULTIVITAMIN WITH MINERALS)  TABS tablet  . oxyCODONE (ROXICODONE) 5 MG immediate release tablet  . rosuvastatin (CRESTOR) 5 MG tablet  . scopolamine (TRANSDERM-SCOP) 1 MG/3DAYS  . Zinc 50 MG TABS   No current facility-administered medications for this encounter.    Myra Gianotti, PA-C Surgical Short Stay/Anesthesiology [facility] Phone [phone] [facility] Phone [phone] 01/02/2020 1:12 PM

## 2020-01-02 ENCOUNTER — Other Ambulatory Visit (HOSPITAL_COMMUNITY)
Admission: RE | Admit: 2020-01-02 | Discharge: 2020-01-02 | Disposition: A | Discharge status: Home / Self Care | Payer: Medicare Other | Source: Ambulatory Visit | Attending: Neurological Surgery | Admitting: Neurological Surgery

## 2020-01-02 DIAGNOSIS — Z20822 Contact with and (suspected) exposure to covid-19: Secondary | ICD-10-CM | POA: Insufficient documentation

## 2020-01-02 DIAGNOSIS — Z01812 Encounter for preprocedural laboratory examination: Secondary | ICD-10-CM | POA: Diagnosis not present

## 2020-01-03 LAB — NOVEL CORONAVIRUS, NAA (HOSP ORDER, SEND-OUT TO REF LAB; TAT 18-24 HRS): SARS-CoV-2, NAA: NOT DETECTED

## 2020-01-06 ENCOUNTER — Inpatient Hospital Stay (HOSPITAL_COMMUNITY): Payer: Medicare Other | Admitting: Vascular Surgery

## 2020-01-06 ENCOUNTER — Inpatient Hospital Stay (HOSPITAL_COMMUNITY): Payer: Medicare Other

## 2020-01-06 ENCOUNTER — Other Ambulatory Visit: Payer: Self-pay

## 2020-01-06 ENCOUNTER — Encounter (HOSPITAL_COMMUNITY)
Admission: RE | Disposition: A | Discharge status: Home / Self Care | Payer: Self-pay | Source: Home / Self Care | Attending: Neurological Surgery

## 2020-01-06 ENCOUNTER — Observation Stay (HOSPITAL_COMMUNITY)
Admission: RE | Admit: 2020-01-06 | Discharge: 2020-01-07 | Disposition: A | Discharge status: Home / Self Care | Payer: Medicare Other | Attending: Neurological Surgery | Admitting: Neurological Surgery

## 2020-01-06 ENCOUNTER — Encounter (HOSPITAL_COMMUNITY): Payer: Self-pay | Admitting: Neurological Surgery

## 2020-01-06 ENCOUNTER — Inpatient Hospital Stay (HOSPITAL_COMMUNITY): Payer: Medicare Other | Admitting: Anesthesiology

## 2020-01-06 DIAGNOSIS — S32009K Unspecified fracture of unspecified lumbar vertebra, subsequent encounter for fracture with nonunion: Secondary | ICD-10-CM | POA: Diagnosis present

## 2020-01-06 DIAGNOSIS — E785 Hyperlipidemia, unspecified: Secondary | ICD-10-CM | POA: Diagnosis not present

## 2020-01-06 DIAGNOSIS — M5416 Radiculopathy, lumbar region: Secondary | ICD-10-CM | POA: Insufficient documentation

## 2020-01-06 DIAGNOSIS — Z6835 Body mass index (BMI) 35.0-35.9, adult: Secondary | ICD-10-CM | POA: Diagnosis not present

## 2020-01-06 DIAGNOSIS — Z981 Arthrodesis status: Secondary | ICD-10-CM | POA: Diagnosis not present

## 2020-01-06 DIAGNOSIS — I4892 Unspecified atrial flutter: Secondary | ICD-10-CM | POA: Diagnosis not present

## 2020-01-06 DIAGNOSIS — M5137 Other intervertebral disc degeneration, lumbosacral region: Secondary | ICD-10-CM | POA: Diagnosis not present

## 2020-01-06 DIAGNOSIS — M960 Pseudarthrosis after fusion or arthrodesis: Secondary | ICD-10-CM | POA: Diagnosis not present

## 2020-01-06 DIAGNOSIS — E669 Obesity, unspecified: Secondary | ICD-10-CM | POA: Diagnosis not present

## 2020-01-06 DIAGNOSIS — Z419 Encounter for procedure for purposes other than remedying health state, unspecified: Secondary | ICD-10-CM

## 2020-01-06 DIAGNOSIS — Z888 Allergy status to other drugs, medicaments and biological substances status: Secondary | ICD-10-CM | POA: Insufficient documentation

## 2020-01-06 DIAGNOSIS — Y838 Other surgical procedures as the cause of abnormal reaction of the patient, or of later complication, without mention of misadventure at the time of the procedure: Secondary | ICD-10-CM | POA: Diagnosis not present

## 2020-01-06 DIAGNOSIS — I10 Essential (primary) hypertension: Secondary | ICD-10-CM | POA: Insufficient documentation

## 2020-01-06 DIAGNOSIS — M4327 Fusion of spine, lumbosacral region: Secondary | ICD-10-CM | POA: Diagnosis not present

## 2020-01-06 DIAGNOSIS — Z79899 Other long term (current) drug therapy: Secondary | ICD-10-CM | POA: Diagnosis not present

## 2020-01-06 HISTORY — PX: ANTERIOR LUMBAR FUSION: SHX1170

## 2020-01-06 HISTORY — PX: ABDOMINAL EXPOSURE: SHX5708

## 2020-01-06 SURGERY — ANTERIOR LUMBAR FUSION 1 LEVEL
Anesthesia: General

## 2020-01-06 MED ORDER — ROCURONIUM BROMIDE 10 MG/ML (PF) SYRINGE
PREFILLED_SYRINGE | INTRAVENOUS | Status: DC | PRN
  Administered 2020-01-06: 40 mg via INTRAVENOUS
  Administered 2020-01-06 (×2): 50 mg via INTRAVENOUS

## 2020-01-06 MED ORDER — ONDANSETRON HCL 4 MG/2ML IJ SOLN
INTRAMUSCULAR | Status: AC
  Filled 2020-01-06: qty 2

## 2020-01-06 MED ORDER — PHENYLEPHRINE 40 MCG/ML (10ML) SYRINGE FOR IV PUSH (FOR BLOOD PRESSURE SUPPORT)
PREFILLED_SYRINGE | INTRAVENOUS | Status: DC | PRN
  Administered 2020-01-06 (×5): 80 ug via INTRAVENOUS

## 2020-01-06 MED ORDER — LACTATED RINGERS IV SOLN: INTRAVENOUS | Status: DC | PRN

## 2020-01-06 MED ORDER — CHLORHEXIDINE GLUCONATE CLOTH 2 % EX PADS: 6.0000 | MEDICATED_PAD | Freq: Once | CUTANEOUS | Status: DC

## 2020-01-06 MED ORDER — SUCCINYLCHOLINE CHLORIDE 200 MG/10ML IV SOSY
PREFILLED_SYRINGE | INTRAVENOUS | Status: AC
  Filled 2020-01-06: qty 10

## 2020-01-06 MED ORDER — SODIUM CHLORIDE 0.9 % IV SOLN
INTRAVENOUS | Status: DC | PRN
  Administered 2020-01-06: 500 mL

## 2020-01-06 MED ORDER — SUGAMMADEX SODIUM 200 MG/2ML IV SOLN
INTRAVENOUS | Status: DC | PRN
  Administered 2020-01-06: 200 mg via INTRAVENOUS

## 2020-01-06 MED ORDER — HYDROCHLOROTHIAZIDE 12.5 MG PO CAPS
12.5000 mg | ORAL_CAPSULE | Freq: Every day | ORAL | Status: DC
  Administered 2020-01-06: 15:00:00 12.5 mg via ORAL
  Filled 2020-01-06: qty 1

## 2020-01-06 MED ORDER — AMLODIPINE BESYLATE 5 MG PO TABS
5.0000 mg | ORAL_TABLET | Freq: Two times a day (BID) | ORAL | Status: DC
  Administered 2020-01-06 – 2020-01-07 (×2): 5 mg via ORAL
  Filled 2020-01-06 (×2): qty 1

## 2020-01-06 MED ORDER — SODIUM CHLORIDE 0.9 % IV SOLN
250.0000 mL | INTRAVENOUS | Status: DC
  Administered 2020-01-06: 250 mL via INTRAVENOUS

## 2020-01-06 MED ORDER — ACETAMINOPHEN 325 MG PO TABS
650.0000 mg | ORAL_TABLET | ORAL | Status: DC | PRN
  Administered 2020-01-07: 09:00:00 650 mg via ORAL
  Filled 2020-01-06: qty 2

## 2020-01-06 MED ORDER — PHENYLEPHRINE HCL-NACL 10-0.9 MG/250ML-% IV SOLN
INTRAVENOUS | Status: DC | PRN
  Administered 2020-01-06: 10 ug/min via INTRAVENOUS

## 2020-01-06 MED ORDER — ROCURONIUM BROMIDE 10 MG/ML (PF) SYRINGE
PREFILLED_SYRINGE | INTRAVENOUS | Status: AC
  Filled 2020-01-06: qty 10

## 2020-01-06 MED ORDER — HYDROMORPHONE HCL 1 MG/ML IJ SOLN
0.2500 mg | INTRAMUSCULAR | Status: DC | PRN
  Administered 2020-01-06: 12:00:00 0.25 mg via INTRAVENOUS

## 2020-01-06 MED ORDER — BUPIVACAINE HCL (PF) 0.5 % IJ SOLN
INTRAMUSCULAR | Status: AC
  Filled 2020-01-06: qty 30

## 2020-01-06 MED ORDER — PHENOL 1.4 % MT LIQD: 1.0000 | OROMUCOSAL | Status: DC | PRN

## 2020-01-06 MED ORDER — BISACODYL 10 MG RE SUPP: 10.0000 mg | Freq: Every day | RECTAL | Status: DC | PRN

## 2020-01-06 MED ORDER — MAGNESIUM CITRATE PO SOLN
1.0000 | Freq: Once | ORAL | Status: AC
  Administered 2020-01-06: 19:00:00 1 via ORAL
  Filled 2020-01-06: qty 296

## 2020-01-06 MED ORDER — LIDOCAINE 2% (20 MG/ML) 5 ML SYRINGE
INTRAMUSCULAR | Status: AC
  Filled 2020-01-06: qty 5

## 2020-01-06 MED ORDER — ALUM & MAG HYDROXIDE-SIMETH 200-200-20 MG/5ML PO SUSP: 30.0000 mL | Freq: Four times a day (QID) | ORAL | Status: DC | PRN

## 2020-01-06 MED ORDER — OXYCODONE HCL 5 MG PO TABS
5.0000 mg | ORAL_TABLET | ORAL | Status: DC | PRN
  Administered 2020-01-06 (×3): 10 mg via ORAL
  Filled 2020-01-06 (×2): qty 2

## 2020-01-06 MED ORDER — CEFAZOLIN SODIUM-DEXTROSE 2-4 GM/100ML-% IV SOLN
2.0000 g | INTRAVENOUS | Status: AC
  Administered 2020-01-06: 08:00:00 2 g via INTRAVENOUS

## 2020-01-06 MED ORDER — 0.9 % SODIUM CHLORIDE (POUR BTL) OPTIME
TOPICAL | Status: DC | PRN
  Administered 2020-01-06: 1000 mL

## 2020-01-06 MED ORDER — METHOCARBAMOL 500 MG PO TABS
ORAL_TABLET | ORAL | Status: AC
  Filled 2020-01-06: qty 1

## 2020-01-06 MED ORDER — MORPHINE SULFATE (PF) 2 MG/ML IV SOLN
2.0000 mg | INTRAVENOUS | Status: DC | PRN
  Administered 2020-01-06: 18:00:00 2 mg via INTRAVENOUS
  Filled 2020-01-06: qty 1

## 2020-01-06 MED ORDER — FENTANYL CITRATE (PF) 250 MCG/5ML IJ SOLN
INTRAMUSCULAR | Status: AC
  Filled 2020-01-06: qty 5

## 2020-01-06 MED ORDER — SENNA 8.6 MG PO TABS
1.0000 | ORAL_TABLET | Freq: Two times a day (BID) | ORAL | Status: DC
  Administered 2020-01-06 – 2020-01-07 (×2): 8.6 mg via ORAL
  Filled 2020-01-06 (×2): qty 1

## 2020-01-06 MED ORDER — OXYCODONE HCL 5 MG PO TABS
ORAL_TABLET | ORAL | Status: AC
  Filled 2020-01-06: qty 2

## 2020-01-06 MED ORDER — PROPOFOL 10 MG/ML IV BOLUS
INTRAVENOUS | Status: DC | PRN
  Administered 2020-01-06: 120 mg via INTRAVENOUS

## 2020-01-06 MED ORDER — FLEET ENEMA 7-19 GM/118ML RE ENEM: 1.0000 | ENEMA | Freq: Once | RECTAL | Status: DC | PRN

## 2020-01-06 MED ORDER — METHOCARBAMOL 500 MG PO TABS
500.0000 mg | ORAL_TABLET | Freq: Four times a day (QID) | ORAL | Status: DC | PRN
  Administered 2020-01-06 – 2020-01-07 (×3): 500 mg via ORAL
  Filled 2020-01-06 (×2): qty 1

## 2020-01-06 MED ORDER — ONDANSETRON HCL 4 MG/2ML IJ SOLN: 4.0000 mg | Freq: Four times a day (QID) | INTRAMUSCULAR | Status: DC | PRN

## 2020-01-06 MED ORDER — ROSUVASTATIN CALCIUM 5 MG PO TABS
5.0000 mg | ORAL_TABLET | Freq: Every day | ORAL | Status: DC
  Administered 2020-01-06: 21:00:00 5 mg via ORAL
  Filled 2020-01-06: qty 1

## 2020-01-06 MED ORDER — SODIUM CHLORIDE 0.9% FLUSH
3.0000 mL | Freq: Two times a day (BID) | INTRAVENOUS | Status: DC
  Administered 2020-01-06: 15:00:00 3 mL via INTRAVENOUS

## 2020-01-06 MED ORDER — POLYETHYLENE GLYCOL 3350 17 G PO PACK: 17.0000 g | PACK | Freq: Every day | ORAL | Status: DC | PRN

## 2020-01-06 MED ORDER — METOPROLOL TARTRATE 25 MG PO TABS: 25.0000 mg | ORAL_TABLET | Freq: Every day | ORAL | Status: DC | PRN

## 2020-01-06 MED ORDER — SUCCINYLCHOLINE CHLORIDE 200 MG/10ML IV SOSY
PREFILLED_SYRINGE | INTRAVENOUS | Status: DC | PRN
  Administered 2020-01-06: 100 mg via INTRAVENOUS

## 2020-01-06 MED ORDER — HYDROMORPHONE HCL 1 MG/ML IJ SOLN
INTRAMUSCULAR | Status: AC
  Filled 2020-01-06: qty 1

## 2020-01-06 MED ORDER — ROSUVASTATIN CALCIUM 5 MG PO TABS: 5.0000 mg | ORAL_TABLET | Freq: Every day | ORAL | Status: DC

## 2020-01-06 MED ORDER — ACETAMINOPHEN 10 MG/ML IV SOLN: 1000.0000 mg | Freq: Once | INTRAVENOUS | Status: DC | PRN

## 2020-01-06 MED ORDER — CEFAZOLIN SODIUM-DEXTROSE 2-4 GM/100ML-% IV SOLN
2.0000 g | Freq: Three times a day (TID) | INTRAVENOUS | Status: AC
  Administered 2020-01-06 (×2): 2 g via INTRAVENOUS
  Filled 2020-01-06 (×2): qty 100

## 2020-01-06 MED ORDER — MENTHOL 3 MG MT LOZG: 1.0000 | LOZENGE | OROMUCOSAL | Status: DC | PRN

## 2020-01-06 MED ORDER — CHLORHEXIDINE GLUCONATE 4 % EX LIQD: 60.0000 mL | Freq: Once | CUTANEOUS | Status: DC

## 2020-01-06 MED ORDER — THROMBIN 5000 UNITS EX SOLR
OROMUCOSAL | Status: DC | PRN
  Administered 2020-01-06: 5 mL via TOPICAL

## 2020-01-06 MED ORDER — PROMETHAZINE HCL 25 MG/ML IJ SOLN: 6.2500 mg | INTRAMUSCULAR | Status: DC | PRN

## 2020-01-06 MED ORDER — CEFAZOLIN SODIUM-DEXTROSE 2-4 GM/100ML-% IV SOLN
INTRAVENOUS | Status: AC
  Filled 2020-01-06: qty 100

## 2020-01-06 MED ORDER — PROPOFOL 10 MG/ML IV BOLUS
INTRAVENOUS | Status: AC
  Filled 2020-01-06: qty 40

## 2020-01-06 MED ORDER — DEXAMETHASONE SODIUM PHOSPHATE 10 MG/ML IJ SOLN
INTRAMUSCULAR | Status: DC | PRN
  Administered 2020-01-06: 10 mg via INTRAVENOUS

## 2020-01-06 MED ORDER — LIDOCAINE 2% (20 MG/ML) 5 ML SYRINGE
INTRAMUSCULAR | Status: DC | PRN
  Administered 2020-01-06: 60 mg via INTRAVENOUS

## 2020-01-06 MED ORDER — DOCUSATE SODIUM 100 MG PO CAPS
100.0000 mg | ORAL_CAPSULE | Freq: Two times a day (BID) | ORAL | Status: DC
  Administered 2020-01-06 – 2020-01-07 (×2): 100 mg via ORAL
  Filled 2020-01-06 (×2): qty 1

## 2020-01-06 MED ORDER — PHENYLEPHRINE HCL (PRESSORS) 10 MG/ML IV SOLN
INTRAVENOUS | Status: AC
  Filled 2020-01-06: qty 1

## 2020-01-06 MED ORDER — ONDANSETRON HCL 4 MG PO TABS: 4.0000 mg | ORAL_TABLET | Freq: Four times a day (QID) | ORAL | Status: DC | PRN

## 2020-01-06 MED ORDER — SODIUM CHLORIDE 0.9% FLUSH: 3.0000 mL | INTRAVENOUS | Status: DC | PRN

## 2020-01-06 MED ORDER — METHOCARBAMOL 1000 MG/10ML IJ SOLN
500.0000 mg | Freq: Four times a day (QID) | INTRAVENOUS | Status: DC | PRN
  Filled 2020-01-06: qty 5

## 2020-01-06 MED ORDER — LIDOCAINE-EPINEPHRINE 1 %-1:100000 IJ SOLN
INTRAMUSCULAR | Status: AC
  Filled 2020-01-06: qty 1

## 2020-01-06 MED ORDER — ACETAMINOPHEN 650 MG RE SUPP: 650.0000 mg | RECTAL | Status: DC | PRN

## 2020-01-06 MED ORDER — PHENYLEPHRINE 40 MCG/ML (10ML) SYRINGE FOR IV PUSH (FOR BLOOD PRESSURE SUPPORT)
PREFILLED_SYRINGE | INTRAVENOUS | Status: AC
  Filled 2020-01-06: qty 10

## 2020-01-06 MED ORDER — BENAZEPRIL HCL 20 MG PO TABS
20.0000 mg | ORAL_TABLET | Freq: Two times a day (BID) | ORAL | Status: DC
  Administered 2020-01-06 – 2020-01-07 (×2): 20 mg via ORAL
  Filled 2020-01-06 (×3): qty 1

## 2020-01-06 MED ORDER — FENTANYL CITRATE (PF) 250 MCG/5ML IJ SOLN
INTRAMUSCULAR | Status: DC | PRN
  Administered 2020-01-06: 25 ug via INTRAVENOUS
  Administered 2020-01-06: 100 ug via INTRAVENOUS
  Administered 2020-01-06: 50 ug via INTRAVENOUS
  Administered 2020-01-06: 25 ug via INTRAVENOUS
  Administered 2020-01-06 (×6): 50 ug via INTRAVENOUS

## 2020-01-06 MED ORDER — MIDAZOLAM HCL 2 MG/2ML IJ SOLN
INTRAMUSCULAR | Status: AC
  Filled 2020-01-06: qty 2

## 2020-01-06 MED ORDER — DEXAMETHASONE SODIUM PHOSPHATE 10 MG/ML IJ SOLN
INTRAMUSCULAR | Status: AC
  Filled 2020-01-06: qty 1

## 2020-01-06 MED ORDER — THROMBIN 5000 UNITS EX SOLR
CUTANEOUS | Status: AC
  Filled 2020-01-06: qty 5000

## 2020-01-06 MED ORDER — ONDANSETRON HCL 4 MG/2ML IJ SOLN
INTRAMUSCULAR | Status: DC | PRN
  Administered 2020-01-06: 4 mg via INTRAVENOUS

## 2020-01-06 SURGICAL SUPPLY — 81 items
APPLIER CLIP 11 MED OPEN (CLIP) ×3
BAG DECANTER FOR FLEXI CONT (MISCELLANEOUS) ×3 IMPLANT
BASE TI IMPLANT 10X42X30 10D (Cage) ×3 IMPLANT
BASKET BONE COLLECTION (BASKET) IMPLANT
BOLT BASE TI 5.0X25 VARIABLE (Bolt) ×2 IMPLANT
BOLT BASE TI 5.0X25MM VARIABLE (Bolt) ×1 IMPLANT
BOLT BASE TI 5X20 VARIABLE (Bolt) ×6 IMPLANT
BUR BARREL STRAIGHT FLUTE 4.0 (BURR) IMPLANT
BUR MATCHSTICK NEURO 3.0 LAGG (BURR) IMPLANT
CANISTER SUCT 3000ML PPV (MISCELLANEOUS) IMPLANT
CLIP APPLIE 11 MED OPEN (CLIP) ×1 IMPLANT
CLIP LIGATING EXTRA MED SLVR (CLIP) IMPLANT
CLIP LIGATING EXTRA SM BLUE (MISCELLANEOUS) IMPLANT
COVER BACK TABLE 60X90IN (DRAPES) ×3 IMPLANT
COVER WAND RF STERILE (DRAPES) IMPLANT
DECANTER SPIKE VIAL GLASS SM (MISCELLANEOUS) IMPLANT
DERMABOND ADVANCED (GAUZE/BANDAGES/DRESSINGS) ×2
DERMABOND ADVANCED .7 DNX12 (GAUZE/BANDAGES/DRESSINGS) ×1 IMPLANT
DRAPE C-ARM 42X72 X-RAY (DRAPES) ×3 IMPLANT
DRAPE C-ARMOR (DRAPES) ×3 IMPLANT
DRAPE LAPAROTOMY 100X72X124 (DRAPES) ×3 IMPLANT
DURAPREP 26ML APPLICATOR (WOUND CARE) ×3 IMPLANT
ELECT BLADE 4.0 EZ CLEAN MEGAD (MISCELLANEOUS) ×3
ELECT REM PT RETURN 9FT ADLT (ELECTROSURGICAL) ×3
ELECTRODE BLDE 4.0 EZ CLN MEGD (MISCELLANEOUS) ×1 IMPLANT
ELECTRODE REM PT RTRN 9FT ADLT (ELECTROSURGICAL) ×1 IMPLANT
GAUZE 4X4 16PLY RFD (DISPOSABLE) IMPLANT
GLOVE BIOGEL PI IND STRL 7.5 (GLOVE) IMPLANT
GLOVE BIOGEL PI IND STRL 8.5 (GLOVE) IMPLANT
GLOVE BIOGEL PI INDICATOR 7.5 (GLOVE)
GLOVE BIOGEL PI INDICATOR 8.5 (GLOVE)
GLOVE ECLIPSE 8.5 STRL (GLOVE) ×3 IMPLANT
GLOVE SS BIOGEL STRL SZ 7.5 (GLOVE) ×1 IMPLANT
GLOVE SUPERSENSE BIOGEL SZ 7.5 (GLOVE) ×2
GOWN STRL REUS W/ TWL LRG LVL3 (GOWN DISPOSABLE) ×2 IMPLANT
GOWN STRL REUS W/ TWL XL LVL3 (GOWN DISPOSABLE) ×1 IMPLANT
GOWN STRL REUS W/TWL 2XL LVL3 (GOWN DISPOSABLE) ×3 IMPLANT
GOWN STRL REUS W/TWL LRG LVL3 (GOWN DISPOSABLE) ×4
GOWN STRL REUS W/TWL XL LVL3 (GOWN DISPOSABLE) ×2
HEMOSTAT POWDER KIT SURGIFOAM (HEMOSTASIS) ×3 IMPLANT
INSERT FOGARTY 61MM (MISCELLANEOUS) IMPLANT
INSERT FOGARTY SM (MISCELLANEOUS) IMPLANT
KIT BASIN OR (CUSTOM PROCEDURE TRAY) ×3 IMPLANT
KIT INFUSE X SMALL 1.4CC (Orthopedic Implant) ×3 IMPLANT
KIT TURNOVER KIT B (KITS) ×3 IMPLANT
LOOP VESSEL MAXI BLUE (MISCELLANEOUS) IMPLANT
LOOP VESSEL MINI RED (MISCELLANEOUS) IMPLANT
NEEDLE HYPO 25X1 1.5 SAFETY (NEEDLE) IMPLANT
NEEDLE SPNL 18GX3.5 QUINCKE PK (NEEDLE) ×3 IMPLANT
NS IRRIG 1000ML POUR BTL (IV SOLUTION) ×3 IMPLANT
PACK LAMINECTOMY NEURO (CUSTOM PROCEDURE TRAY) ×3 IMPLANT
PAD ARMBOARD 7.5X6 YLW CONV (MISCELLANEOUS) ×9 IMPLANT
PUTTY BONE ATTRAX 10CC STRIP (Putty) ×3 IMPLANT
SPONGE INTESTINAL PEANUT (DISPOSABLE) ×6 IMPLANT
SPONGE LAP 18X18 RF (DISPOSABLE) ×3 IMPLANT
SPONGE LAP 4X18 RFD (DISPOSABLE) IMPLANT
SPONGE SURGIFOAM ABS GEL 100 (HEMOSTASIS) IMPLANT
STAPLER VISISTAT 35W (STAPLE) IMPLANT
SUT PDS AB 1 CTX 36 (SUTURE) ×3 IMPLANT
SUT PROLENE 4 0 RB 1 (SUTURE)
SUT PROLENE 4-0 RB1 .5 CRCL 36 (SUTURE) IMPLANT
SUT PROLENE 5 0 CC1 (SUTURE) IMPLANT
SUT PROLENE 6 0 C 1 30 (SUTURE) IMPLANT
SUT PROLENE 6 0 CC (SUTURE) IMPLANT
SUT SILK 0 TIES 10X30 (SUTURE) IMPLANT
SUT SILK 2 0 TIES 10X30 (SUTURE) ×3 IMPLANT
SUT SILK 2 0SH CR/8 30 (SUTURE) IMPLANT
SUT SILK 3 0 TIES 10X30 (SUTURE) IMPLANT
SUT SILK 3 0SH CR/8 30 (SUTURE) IMPLANT
SUT VIC AB 0 CT1 27 (SUTURE)
SUT VIC AB 0 CT1 27XBRD ANBCTR (SUTURE) IMPLANT
SUT VIC AB 1 CT1 18XBRD ANBCTR (SUTURE) ×1 IMPLANT
SUT VIC AB 1 CT1 8-18 (SUTURE) ×2
SUT VIC AB 2-0 CP2 18 (SUTURE) ×3 IMPLANT
SUT VIC AB 3-0 SH 8-18 (SUTURE) ×3 IMPLANT
SUT VIC AB 4-0 RB1 18 (SUTURE) ×3 IMPLANT
SUT VICRYL 4-0 PS2 18IN ABS (SUTURE) IMPLANT
TOWEL GREEN STERILE (TOWEL DISPOSABLE) ×6 IMPLANT
TOWEL GREEN STERILE FF (TOWEL DISPOSABLE) ×9 IMPLANT
TRAY FOLEY MTR SLVR 16FR STAT (SET/KITS/TRAYS/PACK) ×3 IMPLANT
WATER STERILE IRR 1000ML POUR (IV SOLUTION) ×3 IMPLANT

## 2020-01-06 NOTE — Transfer of Care (Signed)
Immediate Anesthesia Transfer of Care Note  Patient: [name]  Procedure(s) Performed: Lumbar Five- Sacral One  Anterior lumbar interbody fusion with infuse (N/A ) ABDOMINAL EXPOSURE (N/A )  Patient Location: PACU  Anesthesia Type:General  Level of Consciousness: awake, patient cooperative and responds to stimulation  Airway & Oxygen Therapy: Patient Spontanous Breathing and Patient connected to nasal cannula oxygen  Post-op Assessment: Report given to RN, Post -op Vital signs reviewed and stable and Patient moving all extremities X 4  Post vital signs: Reviewed and stable  Last Vitals:  Vitals Value Taken Time  BP 153/71 01/06/20 1107  Temp    Pulse 79 01/06/20 1111  Resp 13 01/06/20 1111  SpO2 95 % 01/06/20 1111  Vitals shown include unvalidated device data.  Last Pain:  Vitals:   01/06/20 0644  TempSrc: Oral  PainSc:       Patients Stated Pain Goal: 3 ([id number])  Complications: No apparent anesthesia complications

## 2020-01-06 NOTE — Op Note (Signed)
Date of surgery: 01/06/2020 Preoperative diagnosis: Pseudoarthrosis L5-S1 status post decompression and arthrodesis L3-S1 with pedicle screw stabilization 5 years ago Postoperative diagnosis: Same Procedure: Anterior lumbar decompression and revision of pseudoarthrosis with ALIF graft measuring 10 x 42 x 30 mm in size with 10 degrees of lordosis.  Infuse and allograft. Surgeon: Kristeen Miss, MD Approach: Sherren Mocha early MD Anesthesia: General endotracheal Indications: [name] is a 74 year old individual whose had significant chronic back pain number of years ago he underwent decompression fusion from L3 to the sacrum.  He has evidence of a pseudoarthrosis at L5-S1 and after careful follow-up and treatment conservatively because of persistent pain he has been advised regarding a revision using an anterior lumbar interbody approach.  Procedure: The patient was brought to the operating room supine on the stretcher.  After the smooth induction of general endotracheal anesthesia the abdomen was prepped in the chosen area of entry was marked.  Dr. Donnetta Hutching started the procedure by accessing the retroperitoneal space on the left side.  Once retractors were set exposing L5-S1 anteriorly I opened the anterior longitudinal ligament with a #15 blade and a combination of curettes rongeurs and Cobb elevators were used to carefully dissect the disc and remove large portions of degenerated disc material which filled most of the cavity.  On the right side there was identified a titanium spacer that was clearly loose and wiggled easily within the space.  By gradually working around it was able to be freed and pulled out ventrally without difficulty.  Once the spacer was out we then continued to clear the endplates of substantial cartilaginous endplate material even where the spacer was.  Eventually we were able to clear to the posterior longitudinal ligament which was carefully opened off to the left side and then gradually  opened off to the right side once the scar from the more dorsal aspect of the cage was released.  The endplates were shaved smoothed to remove irregularities using a 4 mm barrel bit on the long drill extension.  Once this was prepared a series of sizers was used to choose the appropriate interspace sizer and was felt that a 10 mm tall 42 x 30 mm implant with 10 degrees of lordosis would fit best this was checked radiographically in AP and lateral projections.  For grafting material we chose infuse mixed with a tracks tricalcium phosphate bony substitute.  This was placed into the cage and all around the cage and the cage was inserted under fluoroscopic guidance then to 5 mm x 20 mm screws were placed into S1 to secure the cage inferiorly and a singular 5 x 25 mm screw was used to secure the cage superiorly.  With this the retractors were carefully removed hemostasis was doubly checked the remnants of bone graft were packed in the lateral aspects of the cage and final radiographs were obtained in AP and lateral projection which demonstrated good distraction and reestablishment of disc height at L5-S1 and good filling in midline placement on the AP image with this the anterior rectus fascia was closed with #1 running PDS suture and 2-0 Vicryl was used in the subcutaneous tissues to close Scarpa's fascia 3-0 Vicryl was used subcuticularly and the final layer was closed with 4-0 subcuticular suture.  Dermabond was placed on the skin.  Blood loss was estimated 150 cc.  A tap block will be placed by anesthesia.

## 2020-01-06 NOTE — Anesthesia Procedure Notes (Signed)
Procedure Name: Intubation Date/Time: 01/06/2020 7:47 AM Performed by: Verdie Drown, CRNA Pre-anesthesia Checklist: Patient identified, Emergency Drugs available, Suction available and Patient being monitored Patient Re-evaluated:Patient Re-evaluated prior to induction Oxygen Delivery Method: Circle System Utilized Preoxygenation: Pre-oxygenation with 100% oxygen Induction Type: IV induction Ventilation: Mask ventilation without difficulty Laryngoscope Size: Glidescope and 3 Grade View: Grade I Tube type: Oral Tube size: 7.5 mm Number of attempts: 1 Airway Equipment and Method: Video-laryngoscopy and Rigid stylet Placement Confirmation: ETT inserted through vocal cords under direct vision,  positive ETCO2 and breath sounds checked- equal and bilateral Secured at: 23 cm Tube secured with: Tape Dental Injury: Teeth and Oropharynx as per pre-operative assessment  Difficulty Due To: Difficulty was anticipated and Difficult Airway- due to reduced neck mobility Comments: Previous Cervical Neck Fusion with limited mobility in pts head/neck.  Prior to induction, pt's head/neck supported to pts position of comfort.

## 2020-01-06 NOTE — Progress Notes (Signed)
Patient ID: [name], male   DOB: 11-25-1946, 74 y.o.   MRN: [id number] Vital signs stable.  Motor function is doing good and patient is ambulatory.  He notes his back is feeling improved already.  Abdomen with minimal soreness.  Will check in a.m. and plan discharge for tomorrow.

## 2020-01-06 NOTE — Anesthesia Procedure Notes (Signed)
Arterial Line Insertion Start/End1/18/2021 7:22 AM, 01/06/2020 7:25 AM Performed by: Verdie Drown, CRNA, CRNA  Patient location: Pre-op. Preanesthetic checklist: patient identified, IV checked, site marked, risks and benefits discussed, surgical consent, monitors and equipment checked, pre-op evaluation, timeout performed and anesthesia consent Lidocaine 1% used for infiltration and patient sedated Left, radial was placed Catheter size: 20 G Hand hygiene performed  and maximum sterile barriers used  Allen's test indicative of satisfactory collateral circulation Attempts: 1 Procedure performed without using ultrasound guided technique. Following insertion, dressing applied. Post procedure assessment: normal  Patient tolerated the procedure well with no immediate complications.

## 2020-01-06 NOTE — H&P (Signed)
[name] is an 74 y.o. male.   Chief Complaint: Back and right and left lower extremity pain HPI: [name] is a 74 year old individual who over period of years had decompressions and fusions done from L1 [id number] he had a transinguinal injection done on the right side of the he notes that for a week period of time he had some back pain relief but then he started getting right lower extremity pain and back pain that became more more severe and intense recent studies in September 2020 demonstrates that he has a pseudoarthrosis at the level of L5-S1.  Because of his continued ongoing pain interventions and in December I did a translaminar epidural injection at L5-S1 which gave him modest relief over the holidays but because the pain has been severe and intense I discussed doing an anterior lumbar interbody arthrodesis revision in an effort to gain some stability S1.  He is now admitted for that procedure  Past Medical History:  Diagnosis Date  . Arthritis   . Complication of anesthesia    FUSion of SKULL thru C7- patient has no movement of head or neck - stiffness from fusion  . Diplopia 07/10/2013  . Diverticulosis of colon (without mention of hemorrhage) 2009   Colonoscopy  . Dyslipidemia   . Dysrhythmia    PAF- not frequently  . HTN (hypertension)   . Hx of colonic polyps 2009   Colonoscopy(Hyperplastic)  . Obesity   . 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 Paroxysmal atrial flutter [facility])     Past Surgical History:  Procedure Laterality Date  . [place]   2 surgery- skull to C7  . COLON RESECTION  2007  . COLONOSCOPY W/ POLYPECTOMY    . HEMORRHOID SURGERY N/A 01/13/2016   Procedure: HEMORRHOIDECTOMY AND REMOVAL OF ANAL SKIN TAG;  Surgeon: Coralie Keens, MD;  Location: [facility];  Service: General;  Laterality: N/A;  . REPLACEMENT TOTAL KNEE Left 2005   left  . SYNOVIAL CYST EXCISION     lumbar spine  . TONSILLECTOMY    . TOTAL KNEE ARTHROPLASTY Right  01/06/2017   Procedure: RIGHT TOTAL KNEE ARTHROPLASTY;  Surgeon: Sydnee Cabal, MD;  Location: [facility];  Service: Orthopedics;  Laterality: Right;  Adductior Block    Family History  Problem Relation Age of Onset  . Coronary artery disease Mother   . Coronary artery disease Father   . Colon cancer Neg Hx   . Stomach cancer Neg Hx    Social History:  reports that he has never smoked. He has never used smokeless tobacco. He reports that he does not drink alcohol or use drugs.  Allergies:  Allergies  Allergen Reactions  . Tamsulosin Hcl Other (See Comments)    Orthostatic hypotension  . Niacin Diarrhea    Medications Prior to Admission  Medication Sig Dispense Refill  . amLODipine (NORVASC) 5 MG tablet Take 1 tablet (5 mg total) by mouth 2 (two) times daily. 180 tablet 3  . Ascorbic Acid (VITAMIN C) 1000 MG tablet Take 1,000 mg by mouth daily.    Marland Kitchen aspirin EC 81 MG tablet Take 81 mg by mouth daily.    . B Complex-C (B-COMPLEX WITH VITAMIN C) tablet Take 1 tablet by mouth daily.    . benazepril (LOTENSIN) 20 MG tablet Take 1 tablet (20 mg total) by mouth 2 (two) times daily. 180 tablet 3  . Cholecalciferol (VITAMIN D3) 25 MCG (1000 UT) CAPS Take 1,000 Units by mouth daily.     Marland Kitchen  Cyanocobalamin (VITAMIN B-12 PO) Take 1 tablet by mouth daily.    . Glucosamine-Chondroitin (COSAMIN DS PO) Take 1 tablet by mouth.    . hydrochlorothiazide (,MICROZIDE/HYDRODIURIL,) 12.5 MG capsule Take 12.5 mg by mouth daily with lunch.     . Multiple Vitamin (MULTIVITAMIN WITH MINERALS) TABS tablet Take 1 tablet by mouth daily.    Marland Kitchen oxyCODONE (ROXICODONE) 5 MG immediate release tablet Take 1-2 tablets (5-10 mg total) by mouth every 4 (four) hours as needed for severe pain or breakthrough pain. 60 tablet 0  . rosuvastatin (CRESTOR) 5 MG tablet Take 5 mg by mouth at bedtime.     Marland Kitchen scopolamine (TRANSDERM-SCOP) 1 MG/3DAYS Place 1 patch onto the skin every three (3) days as needed (nausea).    . Zinc 50 MG TABS  Take 50 mg by mouth daily.     . metoprolol tartrate (LOPRESSOR) 25 MG tablet Take 25 mg by mouth daily as needed (afib.).       No results found for this or any previous visit (from the past 48 hour(s)). No results found.  Review of Systems  Constitutional: Negative.   HENT: Negative.   Eyes: Negative.   Respiratory: Negative.   Cardiovascular: Negative.   Gastrointestinal: Negative.   Endocrine: Negative.   Musculoskeletal: Positive for back pain.  Skin: Negative.   Allergic/Immunologic: Negative.   Neurological: Positive for weakness and numbness.  Hematological: Negative.   Psychiatric/Behavioral: Negative.     Blood pressure (!) 158/70, pulse 65, temperature 98.4 F (36.9 C), temperature source Oral, resp. rate 17, height 5\' 11"  (1.803 m), weight 115.8 kg, SpO2 98 %. Physical Exam  Constitutional: He is oriented to person, place, and time. He appears well-developed and well-nourished.  HENT:  Head: Normocephalic and atraumatic.  Eyes: Pupils are equal, round, and reactive to light. Conjunctivae and EOM are normal.  Neck:  Patient has extensive previous neck fusion with limited range of motion turning only 30 degrees left and right  Cardiovascular: Normal rate and regular rhythm.  Respiratory: Effort normal and breath sounds normal.  GI: Soft. Bowel sounds are normal.  Genitourinary:    Penis and rectum normal.   Musculoskeletal:     Comments: Multilevel lumbar fusion positive straight leg raising to 50 degrees in either lower extremity Patrick's maneuver is negative bilaterally  Neurological: He is alert and oriented to person, place, and time.  Absent deep tendon reflexes of the patella and the Achilles cranial nerve examination is normal.  Upper extremity strength is intact however absent reflexes are noted in the biceps triceps and brachioradialis  Skin: Skin is warm and dry.  Psychiatric: He has a normal mood and affect. His behavior is normal. Judgment and thought  content normal.     Assessment/Plan Pseudoarthrosis L5-S1.History of fusion L3 to sacrum.  Plan: Anterior lumbar interbody arthrodesis L5-S1.  Earleen Newport, MD 01/06/2020, 7:34 AM

## 2020-01-06 NOTE — Op Note (Signed)
    OPERATIVE REPORT  DATE OF SURGERY: 01/06/2020  PATIENT: [name], 74 y.o. male MRN: [id number]  DOB: [birth year]  PRE-OPERATIVE DIAGNOSIS: Degenerative disc disease  POST-OPERATIVE DIAGNOSIS:  Same  PROCEDURE: Anterior exposure for L5-S1 disc fusion  SURGEON:  Curt Jews, M.D.  Co-surgeon for the exposure Dr. Kristeen Miss  ANESTHESIA: General  EBL: per anesthesia record  Total I/O In: 1700 [I.V.:1600; IV Piggyback:100] Out: 335 [Urine:185; Blood:150]  BLOOD ADMINISTERED: none  DRAINS: none  SPECIMEN: none  COUNTS CORRECT:  YES  PATIENT DISPOSITION:  PACU - hemodynamically stable  PROCEDURE DETAILS: The patient was taken to the operating placed supine position where the area of the abdomen was prepped and draped you sterile fashion.  C arm was brought onto the field to reveal the level of the L5-S1 disc.  Incision was made from the midline to the left and carried down through the subcutaneous fat with electrocautery.  The anterior rectus sheath was opened in line with the skin incision.  The rectus muscle was mobilized medially and the retroperitoneal space was entered.  The intraperitoneal contents was mobilized to the right.  The posterior sheath was opened laterally.  Blunt dissection was continued above the level of the psoas muscle to the iliac vessels.  Blunt dissection over the L5-S1 disc was continued.  The middle sacral vessels were clipped and divided.  A Kitner dissector was used to fully mobilized to the right and left of the L5-S1 disc.  The Thompson retractor was brought onto the field and a reverse lip 200 cm blade was positioned to the right of the L5-S1 disc and 150 blade was positioned to the left of the L5-S1 disc.  Malleable retractors were used for superior and inferior exposure.  A spinal needle was placed in the L5-S1 disc and C-arm was brought back onto the field to confirm that this was the appropriate level.  The remainder of the procedure will  be dictated as a separate note by Dr. Magda Kiel, M.D., [facility] 01/06/2020 12:13 PM

## 2020-01-06 NOTE — Progress Notes (Signed)
Orthopedic Tech Progress Note Patient Details:  [name] 1946/07/22 [id number]  Patient ID: [name], male   DOB: 01/03/1946, 74 y.o.   MRN: [id number]   [name] 01/06/2020, 1:17 PMCalled [employer] for brace.

## 2020-01-06 NOTE — Anesthesia Postprocedure Evaluation (Signed)
Anesthesia Post Note  Patient: [name]  Procedure(s) Performed: Lumbar Five- Sacral One  Anterior lumbar interbody fusion with infuse (N/A ) ABDOMINAL EXPOSURE (N/A )     Patient location during evaluation: PACU Anesthesia Type: General Level of consciousness: awake and alert Pain management: pain level controlled Vital Signs Assessment: post-procedure vital signs reviewed and stable Respiratory status: spontaneous breathing, nonlabored ventilation, respiratory function stable and patient connected to nasal cannula oxygen Cardiovascular status: blood pressure returned to baseline and stable Postop Assessment: no apparent nausea or vomiting Anesthetic complications: no    Last Vitals:  Vitals:   01/06/20 0644 01/06/20 1145  BP: (!) 158/70 (!) 147/71  Pulse: 65 77  Resp: 17 11  Temp: 36.9 C   SpO2: 98% 93%    Last Pain:  Vitals:   01/06/20 1145  TempSrc:   PainSc: 7                  Makael Stein S

## 2020-01-07 ENCOUNTER — Encounter: Payer: Self-pay | Admitting: *Deleted

## 2020-01-07 DIAGNOSIS — Z981 Arthrodesis status: Secondary | ICD-10-CM | POA: Diagnosis not present

## 2020-01-07 DIAGNOSIS — M960 Pseudarthrosis after fusion or arthrodesis: Secondary | ICD-10-CM | POA: Diagnosis not present

## 2020-01-07 DIAGNOSIS — M5416 Radiculopathy, lumbar region: Secondary | ICD-10-CM | POA: Diagnosis not present

## 2020-01-07 DIAGNOSIS — I10 Essential (primary) hypertension: Secondary | ICD-10-CM | POA: Diagnosis not present

## 2020-01-07 DIAGNOSIS — E785 Hyperlipidemia, unspecified: Secondary | ICD-10-CM | POA: Diagnosis not present

## 2020-01-07 DIAGNOSIS — I4892 Unspecified atrial flutter: Secondary | ICD-10-CM | POA: Diagnosis not present

## 2020-01-07 MED ORDER — OXYCODONE HCL 5 MG PO TABS: 5.0000 mg | ORAL_TABLET | ORAL | 0 refills | Status: DC | PRN

## 2020-01-07 NOTE — Discharge Instructions (Signed)

## 2020-01-07 NOTE — Discharge Summary (Signed)
Physician Discharge Summary  Patient ID: [name] MRN: [id number] DOB/AGE: 1946-01-11 74 y.o.  Admit date: 01/06/2020 Discharge date: 01/07/2020  Admission Diagnoses: Pseudoarthrosis L5-S1 history of fusion L3 to sacrum  Discharge Diagnoses: Pseudoarthrosis L5-S1, history of lumbar fusion L3 to sacrum.  Bar radiculopathy. Active Problems:   Pseudoarthrosis of lumbar spine   Discharged Condition: good  Hospital Course: Patient was admitted to undergo surgical revision of a fusion via an anterior lumbar interbody arthrodesis.  A TLIF spacer was placed previously from the right side.  There was evidence of nonunion with loosening of the sacral screws.  After careful consideration of his options I advised the patient regarding an anterior lumbar interbody arthrodesis with placement of a titanium spacer after removal of the previous spacer and bone graft with infuse to stimulate bone growth.  He tolerated surgery well.  Consults: vascular surgery  Significant Diagnostic Studies: None  Treatments: surgery: Anterior lumbar decompression and arthrodesis with peek spacer allograft and infuse  Discharge Exam: Blood pressure (!) 131/59, pulse (!) 55, temperature 98.3 F (36.8 C), temperature source Oral, resp. rate 16, height 5\' 11"  (1.803 m), weight 115.8 kg, SpO2 98 %. Incision is clean and dry motor function is intact.  Disposition: Discharge disposition: 01-Home or Self Care       Discharge Instructions    Call MD for:  redness, tenderness, or signs of infection (pain, swelling, redness, odor or green/yellow discharge around incision site)   Complete by: As directed    Call MD for:  severe uncontrolled pain   Complete by: As directed    Call MD for:  temperature >100.4   Complete by: As directed    Diet - low sodium heart healthy   Complete by: As directed    Discharge instructions   Complete by: As directed    Okay to shower. Do not apply salves or appointments to  incision. No heavy lifting with the upper extremities greater than 15 pounds. May resume driving when not requiring pain medication and patient feels comfortable with doing so.   Incentive spirometry RT   Complete by: As directed    Increase activity slowly   Complete by: As directed      Allergies as of 01/07/2020      Reactions   Tamsulosin Hcl Other (See Comments)   Orthostatic hypotension   Niacin Diarrhea      Medication List    TAKE these medications   amLODipine 5 MG tablet Commonly known as: NORVASC Take 1 tablet (5 mg total) by mouth 2 (two) times daily.   aspirin EC 81 MG tablet Take 81 mg by mouth daily.   B-complex with vitamin C tablet Take 1 tablet by mouth daily.   benazepril 20 MG tablet Commonly known as: LOTENSIN Take 1 tablet (20 mg total) by mouth 2 (two) times daily.   COSAMIN DS PO Take 1 tablet by mouth.   hydrochlorothiazide 12.5 MG capsule Commonly known as: MICROZIDE Take 12.5 mg by mouth daily with lunch.   metoprolol tartrate 25 MG tablet Commonly known as: LOPRESSOR Take 25 mg by mouth daily as needed (afib.).   multivitamin with minerals Tabs tablet Take 1 tablet by mouth daily.   oxyCODONE 5 MG immediate release tablet Commonly known as: Roxicodone Take 1-2 tablets (5-10 mg total) by mouth every 4 (four) hours as needed for severe pain or breakthrough pain.   rosuvastatin 5 MG tablet Commonly known as: CRESTOR Take 5 mg by mouth at bedtime.  scopolamine 1 MG/3DAYS Commonly known as: TRANSDERM-SCOP Place 1 patch onto the skin every three (3) days as needed (nausea).   VITAMIN B-12 PO Take 1 tablet by mouth daily.   vitamin C 1000 MG tablet Take 1,000 mg by mouth daily.   Vitamin D3 25 MCG (1000 UT) Caps Take 1,000 Units by mouth daily.   Zinc 50 MG Tabs Take 50 mg by mouth daily.        Signed: Blanchie Dessert Destiny Hagin 01/07/2020, 2:00 PM

## 2020-01-07 NOTE — Evaluation (Signed)
Occupational Therapy Evaluation Patient Details Name: [name] MRN: [id number] DOB: May 16, 1946 Today's Date: 01/07/2020    History of Present Illness 74 yo male ALIF L5-S1 fusion PMH: arthritis, diplopia, fuison skull-C7, HTn, oculomotor 3rd nerve injury, atrial flutter, diverticulosis, LTKR R TKR   Clinical Impression   Patient evaluated by Occupational Therapy with no further acute OT needs identified. All education has been completed and the patient has no further questions. See below for any follow-up Occupational Therapy or equipment needs. OT to sign off. Thank you for referral.      Follow Up Recommendations  No OT follow up    Equipment Recommendations  None recommended by OT    Recommendations for Other Services       Precautions / Restrictions Precautions Precautions: Back Precaution Comments: educated on back precautions Required Braces or Orthoses: (no brace noted at this time)      Mobility Bed Mobility Overal bed mobility: Modified Independent                Transfers Overall transfer level: Modified independent                    Balance                                           ADL either performed or assessed with clinical judgement   ADL Overall ADL's : Modified independent     pt normally hip hikes for LB dressing. Pt required reinforcement of education for bending to dress.  Pt has reachers at home and spouse A    Back handout provided and reviewed adls in detail. Pt educated on:  set an alarm at night for medication, avoid sitting for long periods of time, avoiding lifting more than 5 pounds and never wash directly over incision. All education is complete and patient indicates understanding.                                   General ADL Comments: with cues for bending preacaution     Vision Baseline Vision/History: Wears glasses       Perception     Praxis      Pertinent  Vitals/Pain Pain Assessment: No/denies pain     Hand Dominance Right   Extremity/Trunk Assessment Upper Extremity Assessment Upper Extremity Assessment: Overall WFL for tasks assessed       Cervical / Trunk Assessment Cervical / Trunk Assessment: Other exceptions(s/p surg)   Communication Communication Communication: No difficulties   Cognition Arousal/Alertness: Awake/alert Behavior During Therapy: WFL for tasks assessed/performed Overall Cognitive Status: Within Functional Limits for tasks assessed                                     General Comments       Exercises     Shoulder Instructions      Home Living Family/patient expects to be discharged to:: Private residence Living Arrangements: Spouse/significant other Available Help at Discharge: Family Type of Home: House Home Access: Stairs to enter Technical brewer of Steps: 1   Home Layout: One level     Bathroom Shower/Tub: Occupational psychologist: Handicapped height     Home Equipment: Environmental consultant -  2 wheels;Shower seat - built in;Grab bars - tub/shower;Hand held shower head          Prior Functioning/Environment Level of Independence: Independent                 OT Problem List:        OT Treatment/Interventions:      OT Goals(Current goals can be found in the care plan section) Acute Rehab OT Goals Patient Stated Goal: to go home today  OT Frequency:     Barriers to D/C:            Co-evaluation              AM-PAC OT "6 Clicks" Daily Activity     Outcome Measure Help from another person eating meals?: None Help from another person taking care of personal grooming?: None Help from another person toileting, which includes using toliet, bedpan, or urinal?: None Help from another person bathing (including washing, rinsing, drying)?: None Help from another person to put on and taking off regular upper body clothing?: None Help from another person to  put on and taking off regular lower body clothing?: None 6 Click Score: 24   End of Session Nurse Communication: Mobility status;Precautions  Activity Tolerance: Patient tolerated treatment well Patient left: in bed;with call bell/phone within reach  OT Visit Diagnosis: Unsteadiness on feet (R26.81)                Time: [id number] OT Time Calculation (min): 20 min Charges:  OT General Charges $OT Visit: 1 Visit OT Evaluation $OT Eval Moderate Complexity: 1 Mod   Brynn, OTR/L  Acute Rehabilitation Services Pager: [phone] Office: [phone] .   Jeri Modena 01/07/2020, 10:48 AM

## 2020-01-07 NOTE — Evaluation (Signed)
Physical Therapy Evaluation and Discharge Patient Details Name: [name] MRN: [id number] DOB: Mar 13, 1946 Today's Date: 01/07/2020   History of Present Illness  74 yo male ALIF L5-S1 fusion PMH: arthritis, diplopia, fuison skull-C7, HTn, oculomotor 3rd nerve injury, atrial flutter, diverticulosis, L TKR, R TKR  Clinical Impression  Patient evaluated by Physical Therapy with no further acute PT needs identified. All education has been completed and the patient has no further questions. Pt was able to demonstrate transfers and ambulation with gross modified independence and no AD. Occasional cues required for maintenance of precautions but overall pt mobilizing well. Pt was educated on precautions, brace application/wearing schedule, appropriate activity progression, and car transfer. See below for any follow-up Physical Therapy or equipment needs. PT is signing off. Thank you for this referral.     Follow Up Recommendations No PT follow up;Supervision for mobility/OOB    Equipment Recommendations  None recommended by PT    Recommendations for Other Services       Precautions / Restrictions Precautions Precautions: Back Precaution Booklet Issued: Yes (comment) Precaution Comments: Pt was cued for precautions throughout functional mobility.  Required Braces or Orthoses: (no brace noted at this time) Restrictions Weight Bearing Restrictions: No      Mobility  Bed Mobility Overal bed mobility: Modified Independent             General bed mobility comments: VC's for proper log roll technique. HOB flat and rails lowered to simulate home environment.   Transfers Overall transfer level: Modified independent Equipment used: None             General transfer comment: Pt was able to power-up to full standing position without assistance. Good hand placement on seated surface for safety. No unsteadiness or LOB noted.   Ambulation/Gait Ambulation/Gait assistance: Modified  independent (Device/Increase time) Gait Distance (Feet): 400 Feet Assistive device: None Gait Pattern/deviations: Step-through pattern;Decreased stride length;Trunk flexed Gait velocity: Decreased Gait velocity interpretation: <1.8 ft/sec, indicate of risk for recurrent falls General Gait Details: VC's for improved posture and forward gaze. Overall good gait speed with no unsteadiness or overt LOB noted. Mild trunk flexion noted but able to correct for short bouts with VC's.  Stairs            Wheelchair Mobility    Modified Rankin (Stroke Patients Only)       Balance Overall balance assessment: Needs assistance Sitting-balance support: Feet supported;No upper extremity supported Sitting balance-Leahy Scale: Fair Sitting balance - Comments: Appears to fatigue quickly with sitting up EOB without back support. Noted posterior lean with UE support behind him.    Standing balance support: No upper extremity supported;During functional activity Standing balance-Leahy Scale: Fair                               Pertinent Vitals/Pain Pain Assessment: Faces Faces Pain Scale: Hurts a little bit Pain Location: Abdomen/incision site. Noted peri-wound area very red and inflamed appearing. RN aware.  Pain Descriptors / Indicators: Operative site guarding;Grimacing Pain Intervention(s): Monitored during session;Limited activity within patient's tolerance;Repositioned    Home Living Family/patient expects to be discharged to:: Private residence Living Arrangements: Spouse/significant other Available Help at Discharge: Family Type of Home: House Home Access: Stairs to enter   Technical brewer of Steps: 1 Home Layout: One level Home Equipment: Environmental consultant - 2 wheels;Shower seat - built in;Grab bars - tub/shower;Hand held shower head      Prior Function  Level of Independence: Independent               Hand Dominance   Dominant Hand: Right    Extremity/Trunk  Assessment   Upper Extremity Assessment Upper Extremity Assessment: Overall WFL for tasks assessed    Lower Extremity Assessment Lower Extremity Assessment: Overall WFL for tasks assessed(mild weakness consistent with pre-op diagnosis)    Cervical / Trunk Assessment Cervical / Trunk Assessment: Other exceptions(s/p surgery)  Communication   Communication: No difficulties  Cognition Arousal/Alertness: Awake/alert Behavior During Therapy: WFL for tasks assessed/performed Overall Cognitive Status: Within Functional Limits for tasks assessed                                        General Comments      Exercises     Assessment/Plan    PT Assessment Patent does not need any further PT services  PT Problem List         PT Treatment Interventions      PT Goals (Current goals can be found in the Care Plan section)  Acute Rehab PT Goals Patient Stated Goal: to go home today PT Goal Formulation: All assessment and education complete, DC therapy    Frequency     Barriers to discharge        Co-evaluation               AM-PAC PT "6 Clicks" Mobility  Outcome Measure Help needed turning from your back to your side while in a flat bed without using bedrails?: None Help needed moving from lying on your back to sitting on the side of a flat bed without using bedrails?: None Help needed moving to and from a bed to a chair (including a wheelchair)?: None Help needed standing up from a chair using your arms (e.g., wheelchair or bedside chair)?: None Help needed to walk in hospital room?: None Help needed climbing 3-5 steps with a railing? : A Little 6 Click Score: 23    End of Session Equipment Utilized During Treatment: Gait belt Activity Tolerance: Patient tolerated treatment well Patient left: in bed;with call bell/phone within reach Nurse Communication: Mobility status PT Visit Diagnosis: Unsteadiness on feet (R26.81);Pain Pain - part of body:  (trunk/abdomen)    Time: [id number] PT Time Calculation (min) (ACUTE ONLY): 29 min   Charges:   PT Evaluation $PT Eval Low Complexity: 1 Low PT Treatments $Gait Training: 8-22 mins        Rolinda Roan, PT, DPT Acute Rehabilitation Services Pager: [phone] Office: [phone]   Thelma Comp 01/07/2020, 1:20 PM

## 2020-01-07 NOTE — Progress Notes (Signed)
  Progress Note    01/07/2020 7:23 AM 1 Day Post-Op  Subjective:  "I feel great".   Wants me to look at his incision.    Vitals:   01/06/20 2305 01/07/20 0458  BP: (!) 103/52 (!) 119/55  Pulse: 62 (!) 56  Resp: 18 18  Temp: 98.2 F (36.8 C) 98.7 F (37.1 C)  SpO2: 97% 95%    Physical Exam: General:  No distress Lungs:  Non labored Incisions:  Clean and dry with some bruising around the incision Extremities:  Easily palpable DP pulses bilaterally  Abdomen:  Soft NT/ND  CBC    Component Value Date/Time   WBC 6.9 12/31/2019 1015   RBC 4.32 12/31/2019 1015   HGB 14.1 12/31/2019 1015   HGB 13.3 08/24/2017 1131   HCT 43.0 12/31/2019 1015   HCT 38.2 08/24/2017 1131   PLT 159 12/31/2019 1015   PLT 204 08/24/2017 1131   MCV 99.5 12/31/2019 1015   MCV 94 08/24/2017 1131   MCH 32.6 12/31/2019 1015   MCHC 32.8 12/31/2019 1015   RDW 13.9 12/31/2019 1015   RDW 13.3 08/24/2017 1131   LYMPHSABS 1.4 08/24/2017 1131   MONOABS 0.7 09/13/2013 1017   EOSABS 0.1 08/24/2017 1131   BASOSABS 0.0 08/24/2017 1131    BMET    Component Value Date/Time   NA 136 12/31/2019 1015   NA 139 08/24/2017 1131   K 4.5 12/31/2019 1015   CL 104 12/31/2019 1015   CO2 25 12/31/2019 1015   GLUCOSE 109 (H) 12/31/2019 1015   BUN 22 12/31/2019 1015   BUN 21 08/24/2017 1131   CREATININE 1.12 12/31/2019 1015   CREATININE 0.98 09/05/2016 1029   CALCIUM 9.4 12/31/2019 1015   GFRNONAA >60 12/31/2019 1015   GFRAA >60 12/31/2019 1015    INR    Component Value Date/Time   INR 0.93 12/30/2016 1109     Intake/Output Summary (Last 24 hours) at 01/07/2020 0723 Last data filed at 01/06/2020 1428 Gross per 24 hour  Intake 2180 ml  Output 660 ml  Net 1520 ml     Assessment:  74 y.o. male is s/p:  Anterior exposure for L5-S1 disc fusion  1 Day Post-Op  Plan: -pt doing well this am -there is some bruising around the incision but this should improve with time.   -f/u with Vascular as  needed.   Leontine Locket, PA-C [facility] [phone] 01/07/2020 7:23 AM

## 2020-01-07 NOTE — Care Management CC44 (Signed)
Condition Code 44 Documentation Completed  Patient Details  Name: [name] MRN: [id number] Date of Birth: 1946-03-20   Condition Code 44 given:  Yes Patient signature on Condition Code 44 notice:  Yes Documentation of 2 MD's agreement:  Yes Code 44 added to claim:  Yes    Sharin Mons, RN 01/07/2020, 11:50 AM

## 2020-01-07 NOTE — Plan of Care (Signed)
Patient alert and oriented, mae's well, voiding adequate amount of urine, swallowing without difficulty, no c/o pain at time of discharge. Patient discharged home with family. Script and discharged instructions given to patient. Patient and family stated understanding of instructions given. Patient has an appointment with Dr. Elsner  

## 2020-01-07 NOTE — Care Management Obs Status (Signed)
MEDICARE OBSERVATION STATUS NOTIFICATION   Patient Details  Name: [name] MRN: [id number] Date of Birth: 1946-05-13   Medicare Observation Status Notification Given:  Yes    Sharin Mons, RN 01/07/2020, 11:50 AM

## 2020-01-22 DIAGNOSIS — S32009K Unspecified fracture of unspecified lumbar vertebra, subsequent encounter for fracture with nonunion: Secondary | ICD-10-CM | POA: Diagnosis not present

## 2020-02-12 ENCOUNTER — Other Ambulatory Visit: Payer: Self-pay | Admitting: Physician Assistant

## 2020-02-14 DIAGNOSIS — R197 Diarrhea, unspecified: Secondary | ICD-10-CM | POA: Diagnosis not present

## 2020-02-14 DIAGNOSIS — N529 Male erectile dysfunction, unspecified: Secondary | ICD-10-CM | POA: Diagnosis not present

## 2020-02-26 DIAGNOSIS — L905 Scar conditions and fibrosis of skin: Secondary | ICD-10-CM | POA: Diagnosis not present

## 2020-02-26 DIAGNOSIS — L570 Actinic keratosis: Secondary | ICD-10-CM | POA: Diagnosis not present

## 2020-02-26 DIAGNOSIS — Z85820 Personal history of malignant melanoma of skin: Secondary | ICD-10-CM | POA: Diagnosis not present

## 2020-02-26 DIAGNOSIS — L819 Disorder of pigmentation, unspecified: Secondary | ICD-10-CM | POA: Diagnosis not present

## 2020-02-26 DIAGNOSIS — L718 Other rosacea: Secondary | ICD-10-CM | POA: Diagnosis not present

## 2020-03-04 DIAGNOSIS — S32009K Unspecified fracture of unspecified lumbar vertebra, subsequent encounter for fracture with nonunion: Secondary | ICD-10-CM | POA: Diagnosis not present

## 2020-03-26 DIAGNOSIS — Z1389 Encounter for screening for other disorder: Secondary | ICD-10-CM | POA: Diagnosis not present

## 2020-03-26 DIAGNOSIS — D039 Melanoma in situ, unspecified: Secondary | ICD-10-CM | POA: Diagnosis not present

## 2020-03-26 DIAGNOSIS — Z0000 Encounter for general adult medical examination without abnormal findings: Secondary | ICD-10-CM | POA: Diagnosis not present

## 2020-03-26 DIAGNOSIS — E782 Mixed hyperlipidemia: Secondary | ICD-10-CM | POA: Diagnosis not present

## 2020-03-26 DIAGNOSIS — I700 Atherosclerosis of aorta: Secondary | ICD-10-CM | POA: Diagnosis not present

## 2020-03-26 DIAGNOSIS — M5136 Other intervertebral disc degeneration, lumbar region: Secondary | ICD-10-CM | POA: Diagnosis not present

## 2020-03-26 DIAGNOSIS — Z23 Encounter for immunization: Secondary | ICD-10-CM | POA: Diagnosis not present

## 2020-03-26 DIAGNOSIS — J309 Allergic rhinitis, unspecified: Secondary | ICD-10-CM | POA: Diagnosis not present

## 2020-03-26 DIAGNOSIS — I10 Essential (primary) hypertension: Secondary | ICD-10-CM | POA: Diagnosis not present

## 2020-03-26 DIAGNOSIS — M5090 Cervical disc disorder, unspecified, unspecified cervical region: Secondary | ICD-10-CM | POA: Diagnosis not present

## 2020-03-26 DIAGNOSIS — R7309 Other abnormal glucose: Secondary | ICD-10-CM | POA: Diagnosis not present

## 2020-03-26 DIAGNOSIS — N400 Enlarged prostate without lower urinary tract symptoms: Secondary | ICD-10-CM | POA: Diagnosis not present

## 2020-04-30 ENCOUNTER — Other Ambulatory Visit: Payer: Self-pay | Admitting: Physician Assistant

## 2020-06-15 ENCOUNTER — Ambulatory Visit (INDEPENDENT_AMBULATORY_CARE_PROVIDER_SITE_OTHER): Payer: Medicare Other | Admitting: Internal Medicine

## 2020-06-15 ENCOUNTER — Encounter: Payer: Self-pay | Admitting: Internal Medicine

## 2020-06-15 ENCOUNTER — Other Ambulatory Visit: Payer: Self-pay

## 2020-06-15 VITALS — BP 136/70 | HR 66 | Ht 71.0 in | Wt 261.6 lb

## 2020-06-15 DIAGNOSIS — I4892 Unspecified atrial flutter: Secondary | ICD-10-CM | POA: Diagnosis not present

## 2020-06-15 NOTE — Progress Notes (Signed)
Cardiology Office Note   Date:  06/15/2020   ID:  [name], [name] 01-25-1946, MRN [id number]  PCP:  [name], MD  Cardiologist:   [name], MD   Pt presents for dyspnea eval     History of Present Illness: [name] is a 75 y.o. male with a history of paroxy atrial flutter (rare, sensed), HTN and HL  Echo in 2017 LVEF was normal   Holter monitor showed 8.7% PVCs  Freq PACs      I saw him in 2018    Complained of SOB with activity (at beach) as well as heart racing   Not like previous flutter    I ordered a CT  This was neg for PE   Did show some calcifications of LAD and RCA    Pt's symptoms eased I saw him again and he had some SOB   Myovue scheduled   THis showed no ischemia     I have not seen him since 2018     He was last seen by Kathleen Argue as a televisit in Dec 2020  The pt denies CP   No fluttering spells   No dizziness   (he flet his heart beating in neck when he had flutter in past) Biggest complaint is again back pain   He had repeat surgery this winter    Outpatient Medications Prior to Visit  Medication Sig Dispense Refill   amLODipine (NORVASC) 5 MG tablet TAKE 1 TABLET BY MOUTH TWICE A DAY 180 tablet 3   Ascorbic Acid (VITAMIN C) 1000 MG tablet Take 1,000 mg by mouth daily.     aspirin EC 81 MG tablet Take 81 mg by mouth daily.     B Complex-C (B-COMPLEX WITH VITAMIN C) tablet Take 1 tablet by mouth daily.     benazepril (LOTENSIN) 20 MG tablet TAKE 1 TABLET BY MOUTH TWICE A DAY 180 tablet 3   Cholecalciferol (VITAMIN D3) 25 MCG (1000 UT) CAPS Take 1,000 Units by mouth daily.      Cyanocobalamin (VITAMIN B-12 PO) Take 1 tablet by mouth daily.     Glucosamine-Chondroitin (COSAMIN DS PO) Take 1 tablet by mouth.     hydrochlorothiazide (,MICROZIDE/HYDRODIURIL,) 12.5 MG capsule Take 12.5 mg by mouth daily with lunch.      metoprolol tartrate (LOPRESSOR) 25 MG tablet Take 25 mg by mouth daily as needed (afib.).      Multiple Vitamin  (MULTIVITAMIN WITH MINERALS) TABS tablet Take 1 tablet by mouth daily.     oxyCODONE (ROXICODONE) 5 MG immediate release tablet Take 1-2 tablets (5-10 mg total) by mouth every 4 (four) hours as needed for severe pain or breakthrough pain. 120 tablet 0   rosuvastatin (CRESTOR) 5 MG tablet Take 1 tablet (5 mg total) by mouth daily. 90 tablet 1   scopolamine (TRANSDERM-SCOP) 1 MG/3DAYS Place 1 patch onto the skin every three (3) days as needed (nausea).     Zinc 50 MG TABS Take 50 mg by mouth daily.      No facility-administered medications prior to visit.     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   Paroxysmal atrial flutter ([place])     Past Surgical History:  Procedure Laterality Date   ABDOMINAL EXPOSURE N/A 01/06/2020   Procedure: ABDOMINAL EXPOSURE;  Surgeon: Rosetta Posner, MD;  Location: [facility];  Service: Vascular;  Laterality: N/A;   ANTERIOR LUMBAR FUSION N/A 01/06/2020   Procedure: Lumbar Five- Sacral One  Anterior lumbar interbody fusion with infuse;  Surgeon: Kristeen Miss, MD;  Location: [facility];  Service: Neurosurgery;  Laterality: N/A;  Lumbar Five- Sacral One  Anterior lumbar interbody fusion with infuse   CERVICAL FUSION  1996, 1997   2 surgery- skull to C7   COLON RESECTION  2007   COLONOSCOPY W/ POLYPECTOMY     HEMORRHOID SURGERY N/A 01/13/2016   Procedure: HEMORRHOIDECTOMY AND REMOVAL OF ANAL SKIN TAG;  Surgeon: Coralie Keens, MD;  Location: [facility];  Service: General;  Laterality: N/A;    REPLACEMENT TOTAL KNEE Left 2005   left   SYNOVIAL CYST EXCISION     lumbar spine   TONSILLECTOMY     TOTAL KNEE ARTHROPLASTY Right 01/06/2017   Procedure: RIGHT TOTAL KNEE ARTHROPLASTY;  Surgeon: Sydnee Cabal, MD;  Location: [facility];  Service: Orthopedics;  Laterality: Right;  Adductior Block     Social History:  The patient  reports that he has never smoked. He has never used smokeless tobacco. He reports that he does not drink alcohol and does not use drugs.   Family History:  The patient's family history includes Coronary artery disease in his father and mother.    ROS:  Please see the history of present illness. All other systems are reviewed and  Negative to the above problem except as noted.    PHYSICAL EXAM: VS:  BP 136/70    Pulse 66    Ht 5\' 11"  (1.803 m)    Wt 261 lb 9.6 oz (118.7 kg)    SpO2 97%    BMI 36.49 kg/m   Walking around clinic P100  Sats went to low of 93%  No significant symptomas GEN:  Obese  in no acute distress  HEENT: normal  Neck: JVP normal  No carotid bruits Cardiac: RRR   ; no murmurs, rubs, or gallops,  No LE edema  Respiratory:  clear to auscultation bilaterally, normal work of breathing GI: soft, nontender, nondistended, + BS  No hepatomegaly  MS: no deformity Moving all extremities   Skin: warm and dry, no rash Neuro:  Deferred  Psych: euthymic mood, full affect   EKG:  EKG is  ordered   SR 65 bpm      Lipid Panel    Component Value Date/Time   CHOL 152 08/10/2017 0937   TRIG 80 08/10/2017 0937   HDL 31 (L) 08/10/2017 0937   CHOLHDL 4.9 08/10/2017 0937   CHOLHDL 4.4 09/05/2016 1029   VLDL 22 09/05/2016 1029   LDLCALC 105 (H) 08/10/2017 0937      Wt Readings from Last 3 Encounters:  06/15/20 261 lb 9.6 oz (118.7 kg)  01/06/20 255 lb 3.2 oz (115.8 kg)  12/31/19 255 lb 3.2 oz (115.8 kg)      ASSESSMENT AND PLAN:  1  CAD   Noted on CT scan   No symptoms of angina Last LDL 47   Continue    2  paoxysmal  atrial flutter  Pt  was extremely symptomatic when he had spell severall years ago   None now  Follow   3  Bradycardia  EKG is OK    4 HTN BP is OK  FOllow     5  HL   Continue to follow   I will set to see the pt 9/10 months    Call with problems prior  Stay active    Discussed back pain   Possible PT  5  HL  SHould be on a statin with CAD  Will add Crestor 5  F/U lipidis in 8 wks     Current medicines are reviewed at length with the patient today.  The patient does not have concerns regarding medicines.  Disposition:   FU with  Me in November  As previously scheduled  Signed, [name], MD  06/15/2020 10:00 AM    [address], [place], [place]  [address] Phone: [phone]; Fax: [phone]      Cardiology Office Note   Date:  06/15/2020   ID:  [name], [name] 30-Dec-1945, MRN [id number]  PCP:  [name], MD  Cardiologist:   [name], MD   Pt presents for dyspnea eval     History of Present Illness: [name] is a 74 y.o. male with a history of paroxy atrial flutter, HTN and HL and coronary calcifications on CT  (LAD with calcifications ) Echo showed normal LVEF   Holter monitor showed 8.7% PVCs  Freq PACs    I saw him in clinic in 2018   He had some SOB at thte time   Myovue in Nov 2018 was low risk     He has since been seen by Kathleen Argue   Last in Dec 2020  He called the office this week   Says he had noticed more SOB   Was down at beach  On boat helping with docking    Had to stop what he was doing  Has happened other times  IN shower caught him  Had to slow   Not at all times  Intermittent  For example= loading truck he had no problems   He does say that with spells he noticed some heart racing   Not like atrial flutter  Different    No PND   Pt's wt is down form last time  He is continuing to diet  Limting carbs     Outpatient Medications Prior to Visit  Medication Sig Dispense Refill   amLODipine (NORVASC) 5 MG tablet  TAKE 1 TABLET BY MOUTH TWICE A DAY 180 tablet 3   Ascorbic Acid (VITAMIN C) 1000 MG tablet Take 1,000 mg by mouth daily.     aspirin EC 81 MG tablet Take 81 mg by mouth daily.     B Complex-C (B-COMPLEX WITH VITAMIN C) tablet Take 1 tablet by mouth daily.     benazepril (LOTENSIN) 20 MG tablet TAKE 1 TABLET BY MOUTH TWICE A DAY 180 tablet 3   Cholecalciferol (VITAMIN D3) 25 MCG (1000 UT) CAPS Take 1,000 Units by mouth daily.      Cyanocobalamin (VITAMIN B-12 PO) Take 1 tablet by mouth daily.     Glucosamine-Chondroitin (COSAMIN DS PO) Take 1 tablet by mouth.     hydrochlorothiazide (,MICROZIDE/HYDRODIURIL,) 12.5 MG capsule Take 12.5 mg by mouth daily with lunch.      metoprolol tartrate (LOPRESSOR) 25 MG tablet Take 25  mg by mouth daily as needed (afib.).      Multiple Vitamin (MULTIVITAMIN WITH MINERALS) TABS tablet Take 1 tablet by mouth daily.     oxyCODONE (ROXICODONE) 5 MG immediate release tablet Take 1-2 tablets (5-10 mg total) by mouth every 4 (four) hours as needed for severe pain or breakthrough pain. 120 tablet 0   rosuvastatin (CRESTOR) 5 MG tablet Take 1 tablet (5 mg total) by mouth daily. 90 tablet 1   scopolamine (TRANSDERM-SCOP) 1 MG/3DAYS Place 1 patch onto the skin every three (3) days as needed (nausea).     Zinc 50 MG TABS Take 50 mg by mouth daily.      No facility-administered medications prior to visit.     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   Paroxysmal atrial flutter ([place])      Past Surgical History:  Procedure Laterality Date   ABDOMINAL EXPOSURE N/A 01/06/2020   Procedure: ABDOMINAL EXPOSURE;  Surgeon: Rosetta Posner, MD;  [facility];  Service: Vascular;  Laterality: N/A;   ANTERIOR LUMBAR FUSION N/A 01/06/2020   Procedure: Lumbar Five- Sacral One  Anterior lumbar interbody fusion with infuse;  Surgeon: Kristeen Miss, MD;  Location: [facility];  Service: Neurosurgery;  Laterality: N/A;  Lumbar Five- Sacral One  Anterior lumbar interbody fusion with infuse   CERVICAL FUSION  1996, 1997   2 surgery- skull to C7   COLON RESECTION  2007   COLONOSCOPY W/ POLYPECTOMY     HEMORRHOID SURGERY N/A 01/13/2016   Procedure: HEMORRHOIDECTOMY AND REMOVAL OF ANAL SKIN TAG;  Surgeon: Coralie Keens, MD;  Location: [facility];  Service: General;  Laterality: N/A;   REPLACEMENT TOTAL KNEE Left 2005   left   SYNOVIAL CYST EXCISION     lumbar spine   TONSILLECTOMY     TOTAL KNEE ARTHROPLASTY Right 01/06/2017   Procedure: RIGHT TOTAL KNEE ARTHROPLASTY;  Surgeon: Sydnee Cabal, MD;  [facility];  Service: Orthopedics;  Laterality: Right;  Adductior Block     Social History:  The patient  reports that he has never smoked. He has never used smokeless tobacco. He reports that he does not drink alcohol and does not use drugs.   Family History:  The patient's family history includes Coronary artery disease in his father and mother.    ROS:  Please see the history of present illness. All other systems are reviewed and  Negative to the above problem except as noted.    PHYSICAL EXAM: VS:  BP 136/70    Pulse 66    Ht 5\' 11"  (1.803 m)    Wt 261 lb 9.6 oz (118.7 kg)    SpO2 97%    BMI 36.49 kg/m   Walking around clinic P100  Sats went to low of 93%  No significant symptomas GEN:  Obese  in no acute distress  HEENT: normal  Neck: no JVD, carotid bruits, or masses Cardiac: RRR with occasional skip  ;  no murmurs, rubs, or gallops,  Tr edema  Respiratory:  clear to  auscultation bilaterally, normal work of breathing GI: soft, nontender, nondistended, + BS  No hepatomegaly  MS: no deformity Moving all extremities   Skin: warm and dry, no rash Neuro:  Strength and sensation are intact Psych: euthymic mood, full affect   EKG:  EKG is ordered today.  SR 61  bpm  PACs     Lipid Panel    Component Value Date/Time   CHOL 152 08/10/2017 0937   TRIG 80 08/10/2017 0937   HDL 31 (L) 08/10/2017 0937   CHOLHDL 4.9 08/10/2017 0937   CHOLHDL 4.4 09/05/2016 1029   VLDL 22 09/05/2016 1029   LDLCALC 105 (H) 08/10/2017 0937      Wt Readings from Last 3 Encounters:  06/15/20 261 lb 9.6 oz (118.7 kg)  01/06/20 255 lb 3.2 oz (115.8 kg)  12/31/19 255 lb 3.2 oz (115.8 kg)      ASSESSMENT AND PLAN:  1  Dyspnea. Concerning but history is a little difficult  Does not occur all the time  ? Associated tachycardia I am not convinced of angina but will not exclude   VOlume status appears OK Will check labs  He just had them drawn   Set pt up for event montor  Follow  Activites as tolerated    1  paoxysmal atrial flutter  Current spells above do not sound like the PAF he has had in past    2  Bradycardia  Follow    3  HTN BP is OK      Current medicines are reviewed at length with the patient today.  The patient does not have concerns regarding medicines.  Disposition:   FU with  Me in November  As previously scheduled  Signed, [name], MD  06/15/2020 10:00 AM    [address], [place], [place]  [address] Phone: [phone]; Fax: [phone]

## 2020-06-15 NOTE — Patient Instructions (Signed)
Medication Instructions:  No changes *If you need a refill on your cardiac medications before your next appointment, please call your pharmacy*   Lab Work: none If you have labs (blood work) drawn today and your tests are completely normal, you will receive your results only by:  [place] (if you have MyChart) OR  A paper copy in the mail If you have any lab test that is abnormal or we need to change your treatment, we will call you to review the results.   Testing/Procedures: none   Follow-Up: At [facility], you and your health needs are our priority.  As part of our continuing mission to provide you with exceptional heart care, we have created designated Provider Care Teams.  These Care Teams include your primary Cardiologist (physician) and Advanced Practice Providers (APPs -  Physician Assistants and Nurse Practitioners) who all work together to provide you with the care you need, when you need it.  Your next appointment:   9 month(s)  The format for your next appointment:   In Person  Provider:   [name], MD   Other Instructions

## 2020-07-22 DIAGNOSIS — M5416 Radiculopathy, lumbar region: Secondary | ICD-10-CM | POA: Diagnosis not present

## 2020-07-27 ENCOUNTER — Ambulatory Visit (INDEPENDENT_AMBULATORY_CARE_PROVIDER_SITE_OTHER): Payer: Medicare Other | Admitting: Urology

## 2020-07-27 ENCOUNTER — Other Ambulatory Visit: Payer: Self-pay

## 2020-07-27 ENCOUNTER — Encounter: Payer: Self-pay | Admitting: Urology

## 2020-07-27 VITALS — BP 128/70 | HR 63 | Temp 98.6°F

## 2020-07-27 DIAGNOSIS — N411 Chronic prostatitis: Secondary | ICD-10-CM | POA: Diagnosis not present

## 2020-07-27 DIAGNOSIS — N401 Enlarged prostate with lower urinary tract symptoms: Secondary | ICD-10-CM

## 2020-07-27 DIAGNOSIS — R300 Dysuria: Secondary | ICD-10-CM | POA: Diagnosis not present

## 2020-07-27 DIAGNOSIS — N138 Other obstructive and reflux uropathy: Secondary | ICD-10-CM | POA: Diagnosis not present

## 2020-07-27 LAB — MICROSCOPIC EXAMINATION
Bacteria, UA: NONE SEEN
RBC, Urine: NONE SEEN /hpf (ref 0–2)
Renal Epithel, UA: NONE SEEN /hpf
WBC, UA: NONE SEEN /hpf (ref 0–5)

## 2020-07-27 LAB — URINALYSIS, ROUTINE W REFLEX MICROSCOPIC
Bilirubin, UA: NEGATIVE
Glucose, UA: NEGATIVE
Leukocytes,UA: NEGATIVE
Nitrite, UA: NEGATIVE
RBC, UA: NEGATIVE
Specific Gravity, UA: 1.025 (ref 1.005–1.030)
Urobilinogen, Ur: 0.2 mg/dL (ref 0.2–1.0)
pH, UA: 5.5 (ref 5.0–7.5)

## 2020-07-27 NOTE — Patient Instructions (Signed)
Prostatitis  Prostatitis is swelling of the prostate gland. The prostate helps to make semen. It is below a man's bladder, in front of the rectum. There are different types of prostatitis. Follow these instructions at home:   Take over-the-counter and prescription medicines only as told by your doctor.  If you were prescribed an antibiotic medicine, take it as told by your doctor. Do not stop taking the antibiotic even if you start to feel better.  If your doctor prescribed exercises, do them as directed.  Take sitz baths as told by your doctor. To take a sitz bath, sit in warm water that is deep enough to cover your hips and butt.  Keep all follow-up visits as told by your doctor. This is important. Contact a doctor if:  Your symptoms get worse.  You have a fever. Get help right away if:  You have chills.  You feel sick to your stomach (nauseous).  You throw up (vomit).  You feel light-headed.  You feel like you might pass out (faint).  You cannot pee (urinate).  You have blood or clumps of blood (blood clots) in your pee (urine). This information is not intended to replace advice given to you by your health care provider. Make sure you discuss any questions you have with your health care provider. Document Revised: 11/17/2017 Document Reviewed: 08/25/2016 Elsevier Patient Education  2020 [employer]vier Inc.  

## 2020-07-27 NOTE — Progress Notes (Signed)
Urological Symptom Review  Patient is experiencing the following symptoms: None   Review of Systems  Gastrointestinal (upper)  : Negative for upper GI symptoms  Gastrointestinal (lower) : Negative for lower GI symptoms  Constitutional : Negative for symptoms  Skin: Negative for skin symptoms  Eyes: Negative for eye symptoms  Ear/Nose/Throat : Negative for Ear/Nose/Throat symptoms  Hematologic/Lymphatic: Negative for Hematologic/Lymphatic symptoms  Cardiovascular : Negative for cardiovascular symptoms  Respiratory : Negative for respiratory symptoms  Endocrine: Negative for endocrine symptoms  Musculoskeletal: Back pain  Neurological: Negative for neurological symptoms  Psychologic: Negative for psychiatric symptoms  

## 2020-07-27 NOTE — Progress Notes (Signed)
07/27/2020 11:03 AM   [name] 1946-01-03 [id number]  Referring provider: [name], MD [place] [address] [place],  [place] [address]  dysuria  HPI: Mr Langhans is a 74yo here for followup for BPH with dysuria and chronic prostatitis. No new issues since last visit. Dysuria is very rare. Stream good. Nocturia 1x. No urinary frequency or urgency. Patient is no taking NSAIDS. No urinary issues.   His records from [facility] are as follows:  I have pain or burning with urination.  HPI: Olis Viverette is a 74 year-old male established patient who is here for pain or burning while urinating.  He does have pain or burning when he urinates. He first noticed the symptom approximately 11/18/2016.   He does not urinate more frequently than once every 4 hours in the daytime. He does not have to strain or bear down to start his urinary stream. He does not dribble at the end of urination. He does have an abnormal sensation when needing to urinate. He usually gets up at night to urinate 1 time.   He has had recurrent prostate infections or chronic prostatitis. He has been on antibiotics for prostate infections previously.   He has not previously had an indwelling catheter in for more than two weeks at a time. He has not had a PSA done.   03/30/2017: 10 weeks ago he had a knee replacement and had a foley at that time. After foley removal he developed a UTI and was treated. 1 week after being treated for the UTI he developed dysuria and pelvic pain. Dr. Lysle Rubens placed him on cipro for prostatitis. He was then placed on keflex. The cipro did not improve his LUTS.  He was then placed on a steroid taper by his neurosurgeon which did not improve his LUTS. He was then placed on levaquin for 5 days and noted slight improvement in his LUTS. UA today is normal. No hematuria or hematospermia.   05/02/2017: He notes mild improvement in his dysuria with meloxicam.   06/13/2017: He dysuria has resolved  with valium and meloxicam   08/13/2018: he denies any current dysuria. He is currently having loose stools for which he has been evaluated.   09/13/2019: Dysuria resolved     CC: I have a prostate infection.  HPI: He has been treated with Flomax. He first noticed symptoms of a prostate infection approximately 02/02/2017. His symptoms worsen with anxiety. He has not had recurrent prostate infections or chronic prostatitis. He has been on antibiotics for prostate infections previously.   He does not dribble at the end of urination. He gets up at night to urinate 1 time. He has not had a PSA done.   05/02/2017: He continuous to have perineal pressure and pressure with a full bladder and prior to bowel movement. No difference in LUTS with flomax. He has nocturia 1-3x but if he takes a vicodin he does not get up at night   06/13/2017: Perineal pressure has with valium and meloxicam   08/13/2018: He has not had a flare of his prostatitis since his last visit.   09/13/2019: No flares in th past year. no dysuira     CC: I have an enlarged prostate (follow-up).  HPI: He is currently taking none. He is not on new medications for symptoms of prostate enlargement.   He does have an abnormal sensation when needing to urinate. He is not having problems getting his urine stream started. He does have a  good size and strength to his urinary stream. He is not having problems with emptying his bladder well. He does not dribble at the end of urination.   06/13/2017: He has nocturia 1x. He stopped the flomax 2 days ago because he had significant meloxicam.   08/13/2018: He was taking flomax until 2 months ago and he noted his orthostatic hypotension. He noted his LUTS worsened   09/13/2019: He stopped uroxatral due to hypotension. NO new LUTS. nocturia 0-1x     AUA Symptom Score: Less than 50% of the time he has the sensation of not emptying his bladder completely when finished urinating. Less than 20% of the time  he has to urinate again fewer than two hours after he has finished urinating. Less than 20% of the time he has to start and stop again several times when he urinates. Less than 50% of the time he finds it difficult to postpone urination. Less than 20% of the time he has a weak urinary stream. Less than 20% of the time he has to push or strain to begin urination. He has to get up to urinate 1 time from the time he goes to bed until the time he gets up in the morning.   Calculated AUA Symptom Score: 9     PMH: Past Medical History:  Diagnosis Date  . Arthritis   . Complication of anesthesia    FUSion of SKULL thru C7- patient has no movement of head or neck - stiffness from fusion  . Diplopia 07/10/2013  . Diverticulosis of colon (without mention of hemorrhage) 2009   Colonoscopy  . Dyslipidemia   . Dysrhythmia    PAF- not frequently  . HTN (hypertension)   . Hx of colonic polyps 2009   Colonoscopy(Hyperplastic)  . Obesity   . 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 Paroxysmal atrial flutter [facility])     Surgical History: Past Surgical History:  Procedure Laterality Date  . ABDOMINAL EXPOSURE N/A 01/06/2020   Procedure: ABDOMINAL EXPOSURE;  Surgeon: Rosetta Posner, MD;  Location[facility];  Service: Vascular;  Laterality: N/A;  . ANTERIOR LUMBAR FUSION N/A 01/06/2020   Procedure: Lumbar Five- Sacral One  Anterior lumbar interbody fusion with infuse;  Surgeon: Kristeen Miss, MD;  Location: [place];  Service: Neurosurgery;  Laterality: N/A;  Lumbar Five- Sacral One  Anterior lumbar interbody fusion with infuse  . [place]   2 surgery- skull to C7  . COLON RESECTION  2007  . COLONOSCOPY W/ POLYPECTOMY    . HEMORRHOID SURGERY N/A 01/13/2016   Procedure: HEMORRHOIDECTOMY AND REMOVAL OF ANAL SKIN TAG;  Surgeon: Coralie Keens, MD;  Location: [place];  Service: General;  Laterality: N/A;  . REPLACEMENT TOTAL KNEE Left 2005   left  . SYNOVIAL CYST EXCISION     lumbar spine    . TONSILLECTOMY    . TOTAL KNEE ARTHROPLASTY Right 01/06/2017   Procedure: RIGHT TOTAL KNEE ARTHROPLASTY;  Surgeon: Sydnee Cabal, MD;  Location: [facility];  Service: Orthopedics;  Laterality: Right;  Adductior Block    Home Medications:  Allergies as of 07/27/2020      Reactions   Tamsulosin Hcl Other (See Comments)   Orthostatic hypotension   Niacin Diarrhea      Medication List       Accurate as of July 27, 2020 11:03 AM. If you have any questions, ask your nurse or doctor.        amLODipine 5 MG tablet  Commonly known as: NORVASC TAKE 1 TABLET BY MOUTH TWICE A DAY   aspirin EC 81 MG tablet Take 81 mg by mouth daily.   B-complex with vitamin C tablet Take 1 tablet by mouth daily.   benazepril 20 MG tablet Commonly known as: LOTENSIN TAKE 1 TABLET BY MOUTH TWICE A DAY   COSAMIN DS PO Take 1 tablet by mouth.   hydrochlorothiazide 12.5 MG capsule Commonly known as: MICROZIDE Take 12.5 mg by mouth daily with lunch.   metoprolol tartrate 25 MG tablet Commonly known as: LOPRESSOR Take 25 mg by mouth daily as needed (afib.).   multivitamin with minerals Tabs tablet Take 1 tablet by mouth daily.   oxyCODONE 5 MG immediate release tablet Commonly known as: Roxicodone Take 1-2 tablets (5-10 mg total) by mouth every 4 (four) hours as needed for severe pain or breakthrough pain.   rosuvastatin 5 MG tablet Commonly known as: CRESTOR Take 1 tablet (5 mg total) by mouth daily.   scopolamine 1 MG/3DAYS Commonly known as: TRANSDERM-SCOP Place 1 patch onto the skin every three (3) days as needed (nausea).   VITAMIN B-12 PO Take 1 tablet by mouth daily.   vitamin C 1000 MG tablet Take 1,000 mg by mouth daily.   Vitamin D3 25 MCG (1000 UT) Caps Take 1,000 Units by mouth daily.   Zinc 50 MG Tabs Take 50 mg by mouth daily.       Allergies:  Allergies  Allergen Reactions  . Tamsulosin Hcl Other (See Comments)    Orthostatic hypotension  . Niacin Diarrhea     Family History: Family History  Problem Relation Age of Onset  . Coronary artery disease Mother   . Coronary artery disease Father   . Colon cancer Neg Hx   . Stomach cancer Neg Hx     Social History:  reports that he has never smoked. He has never used smokeless tobacco. He reports that he does not drink alcohol and does not use drugs.  ROS: All other review of systems were reviewed and are negative except what is noted above in HPI  Physical Exam: BP 128/70   Pulse 63   Temp 98.6 F (37 C)   Constitutional:  Alert and oriented, No acute distress. HEENT: [place] AT, moist mucus membranes.  Trachea midline, no masses. Cardiovascular: No clubbing, cyanosis, or edema. Respiratory: Normal respiratory effort, no increased work of breathing. GI: Abdomen is soft, nontender, nondistended, no abdominal masses GU: No CVA tenderness. Circumcised phallus. No masses/lesions on penis, testis, scrotum. Prostate 40g smooth no nodules no induration.  Lymph: No cervical or inguinal lymphadenopathy. Skin: No rashes, bruises or suspicious lesions. Neurologic: Grossly intact, no focal deficits, moving all 4 extremities. Psychiatric: Normal mood and affect.  Laboratory Data: Lab Results  Component Value Date   WBC 6.9 12/31/2019   HGB 14.1 12/31/2019   HCT 43.0 12/31/2019   MCV 99.5 12/31/2019   PLT 159 12/31/2019    Lab Results  Component Value Date   CREATININE 1.12 12/31/2019    Lab Results  Component Value Date   PSA 0.5 09/05/2016   PSA 0.49 09/10/2015    No results found for: TESTOSTERONE  Lab Results  Component Value Date   HGBA1C 5.3 09/05/2016    Urinalysis    Component Value Date/Time   COLORURINE AMBER (A) 12/30/2016 1046   APPEARANCEUR CLOUDY (A) 12/30/2016 1046   LABSPEC 1.020 12/30/2016 1046   PHURINE 5.0 12/30/2016 1046   GLUCOSEU NEGATIVE 12/30/2016 1046  HGBUR NEGATIVE 12/30/2016 [place] 12/30/2016 1046   KETONESUR NEGATIVE  12/30/2016 1046   PROTEINUR NEGATIVE 12/30/2016 1046   UROBILINOGEN 0.2 08/31/2007 1221   NITRITE NEGATIVE 12/30/2016 1046   LEUKOCYTESUR NEGATIVE 12/30/2016 1046    Lab Results  Component Value Date   BACTERIA NONE SEEN 12/30/2016    Pertinent Imaging:  No results found for this or any previous visit.  No results found for this or any previous visit.  No results found for this or any previous visit.  No results found for this or any previous visit.  No results found for this or any previous visit.  No results found for this or any previous visit.  No results found for this or any previous visit.  No results found for this or any previous visit.   Assessment & Plan:    1. Benign prostatic hyperplasia with urinary obstruction -Continue fluid management   2. Dysuria -resolved   3. Chronic prostatitis without hematuria -resolved -RTC 1 year   No follow-ups on file.  Nicolette Bang, MD  [facility] [place]

## 2020-08-10 ENCOUNTER — Other Ambulatory Visit: Payer: Self-pay | Admitting: Physician Assistant

## 2020-08-11 DIAGNOSIS — M5117 Intervertebral disc disorders with radiculopathy, lumbosacral region: Secondary | ICD-10-CM | POA: Diagnosis not present

## 2020-08-11 DIAGNOSIS — M5416 Radiculopathy, lumbar region: Secondary | ICD-10-CM | POA: Diagnosis not present

## 2020-08-27 ENCOUNTER — Ambulatory Visit: Payer: Medicare Other | Admitting: Urology

## 2020-09-02 DIAGNOSIS — L819 Disorder of pigmentation, unspecified: Secondary | ICD-10-CM | POA: Diagnosis not present

## 2020-09-02 DIAGNOSIS — Z85820 Personal history of malignant melanoma of skin: Secondary | ICD-10-CM | POA: Diagnosis not present

## 2020-09-02 DIAGNOSIS — L570 Actinic keratosis: Secondary | ICD-10-CM | POA: Diagnosis not present

## 2020-09-02 DIAGNOSIS — D229 Melanocytic nevi, unspecified: Secondary | ICD-10-CM | POA: Diagnosis not present

## 2020-09-02 DIAGNOSIS — L723 Sebaceous cyst: Secondary | ICD-10-CM | POA: Diagnosis not present

## 2020-09-02 DIAGNOSIS — L821 Other seborrheic keratosis: Secondary | ICD-10-CM | POA: Diagnosis not present

## 2020-09-02 DIAGNOSIS — L718 Other rosacea: Secondary | ICD-10-CM | POA: Diagnosis not present

## 2020-09-02 DIAGNOSIS — L814 Other melanin hyperpigmentation: Secondary | ICD-10-CM | POA: Diagnosis not present

## 2020-09-02 DIAGNOSIS — D485 Neoplasm of uncertain behavior of skin: Secondary | ICD-10-CM | POA: Diagnosis not present

## 2020-09-02 DIAGNOSIS — L905 Scar conditions and fibrosis of skin: Secondary | ICD-10-CM | POA: Diagnosis not present

## 2020-09-02 DIAGNOSIS — D1801 Hemangioma of skin and subcutaneous tissue: Secondary | ICD-10-CM | POA: Diagnosis not present

## 2020-10-08 DIAGNOSIS — D039 Melanoma in situ, unspecified: Secondary | ICD-10-CM | POA: Diagnosis not present

## 2020-10-08 DIAGNOSIS — M5136 Other intervertebral disc degeneration, lumbar region: Secondary | ICD-10-CM | POA: Diagnosis not present

## 2020-10-08 DIAGNOSIS — E782 Mixed hyperlipidemia: Secondary | ICD-10-CM | POA: Diagnosis not present

## 2020-10-08 DIAGNOSIS — I10 Essential (primary) hypertension: Secondary | ICD-10-CM | POA: Diagnosis not present

## 2020-10-08 DIAGNOSIS — Z23 Encounter for immunization: Secondary | ICD-10-CM | POA: Diagnosis not present

## 2020-10-08 DIAGNOSIS — R7303 Prediabetes: Secondary | ICD-10-CM | POA: Diagnosis not present

## 2020-10-08 DIAGNOSIS — I4892 Unspecified atrial flutter: Secondary | ICD-10-CM | POA: Diagnosis not present

## 2020-10-08 DIAGNOSIS — I700 Atherosclerosis of aorta: Secondary | ICD-10-CM | POA: Diagnosis not present

## 2020-10-20 DIAGNOSIS — H02834 Dermatochalasis of left upper eyelid: Secondary | ICD-10-CM | POA: Diagnosis not present

## 2020-10-20 DIAGNOSIS — H02831 Dermatochalasis of right upper eyelid: Secondary | ICD-10-CM | POA: Diagnosis not present

## 2020-10-20 DIAGNOSIS — H2513 Age-related nuclear cataract, bilateral: Secondary | ICD-10-CM | POA: Diagnosis not present

## 2020-10-27 DIAGNOSIS — R195 Other fecal abnormalities: Secondary | ICD-10-CM | POA: Diagnosis not present

## 2020-10-27 DIAGNOSIS — R0981 Nasal congestion: Secondary | ICD-10-CM | POA: Diagnosis not present

## 2020-10-27 DIAGNOSIS — R220 Localized swelling, mass and lump, head: Secondary | ICD-10-CM | POA: Diagnosis not present

## 2020-11-25 DIAGNOSIS — Z6836 Body mass index (BMI) 36.0-36.9, adult: Secondary | ICD-10-CM | POA: Diagnosis not present

## 2020-11-25 DIAGNOSIS — R29818 Other symptoms and signs involving the nervous system: Secondary | ICD-10-CM | POA: Diagnosis not present

## 2020-11-25 DIAGNOSIS — I10 Essential (primary) hypertension: Secondary | ICD-10-CM | POA: Diagnosis not present

## 2020-11-25 DIAGNOSIS — M542 Cervicalgia: Secondary | ICD-10-CM | POA: Diagnosis not present

## 2021-01-21 DIAGNOSIS — H57813 Brow ptosis, bilateral: Secondary | ICD-10-CM | POA: Diagnosis not present

## 2021-01-21 DIAGNOSIS — H02834 Dermatochalasis of left upper eyelid: Secondary | ICD-10-CM | POA: Diagnosis not present

## 2021-01-21 DIAGNOSIS — H53483 Generalized contraction of visual field, bilateral: Secondary | ICD-10-CM | POA: Diagnosis not present

## 2021-01-21 DIAGNOSIS — H0279 Other degenerative disorders of eyelid and periocular area: Secondary | ICD-10-CM | POA: Diagnosis not present

## 2021-01-21 DIAGNOSIS — H02413 Mechanical ptosis of bilateral eyelids: Secondary | ICD-10-CM | POA: Diagnosis not present

## 2021-01-21 DIAGNOSIS — H02831 Dermatochalasis of right upper eyelid: Secondary | ICD-10-CM | POA: Diagnosis not present

## 2021-01-21 DIAGNOSIS — H02423 Myogenic ptosis of bilateral eyelids: Secondary | ICD-10-CM | POA: Diagnosis not present

## 2021-02-01 DIAGNOSIS — H53482 Generalized contraction of visual field, left eye: Secondary | ICD-10-CM | POA: Diagnosis not present

## 2021-02-01 DIAGNOSIS — H53481 Generalized contraction of visual field, right eye: Secondary | ICD-10-CM | POA: Diagnosis not present

## 2021-02-01 DIAGNOSIS — H53483 Generalized contraction of visual field, bilateral: Secondary | ICD-10-CM | POA: Diagnosis not present

## 2021-03-02 DIAGNOSIS — Z85820 Personal history of malignant melanoma of skin: Secondary | ICD-10-CM | POA: Diagnosis not present

## 2021-03-02 DIAGNOSIS — Z85828 Personal history of other malignant neoplasm of skin: Secondary | ICD-10-CM | POA: Diagnosis not present

## 2021-03-02 DIAGNOSIS — D229 Melanocytic nevi, unspecified: Secondary | ICD-10-CM | POA: Diagnosis not present

## 2021-03-02 DIAGNOSIS — L570 Actinic keratosis: Secondary | ICD-10-CM | POA: Diagnosis not present

## 2021-03-02 DIAGNOSIS — L821 Other seborrheic keratosis: Secondary | ICD-10-CM | POA: Diagnosis not present

## 2021-03-02 DIAGNOSIS — L905 Scar conditions and fibrosis of skin: Secondary | ICD-10-CM | POA: Diagnosis not present

## 2021-03-02 DIAGNOSIS — L819 Disorder of pigmentation, unspecified: Secondary | ICD-10-CM | POA: Diagnosis not present

## 2021-03-02 DIAGNOSIS — D1801 Hemangioma of skin and subcutaneous tissue: Secondary | ICD-10-CM | POA: Diagnosis not present

## 2021-03-02 DIAGNOSIS — D485 Neoplasm of uncertain behavior of skin: Secondary | ICD-10-CM | POA: Diagnosis not present

## 2021-03-02 DIAGNOSIS — L814 Other melanin hyperpigmentation: Secondary | ICD-10-CM | POA: Diagnosis not present

## 2021-03-03 ENCOUNTER — Telehealth: Payer: Self-pay | Admitting: *Deleted

## 2021-03-03 NOTE — Telephone Encounter (Signed)
   Primary Cardiologist: [name], MD  Chart reviewed as part of pre-operative protocol coverage. Patient was contacted 03/03/2021 in reference to pre-operative risk assessment for pending surgery as outlined below.  [name] was last seen on 06/15/20 by Dr. Harrington Challenger.  Since that day, [name] has done fine from a cardiac standpoint. He is quite limited in activity by back pain, though can still complete 4 METs without anginal complaints.   Therefore, based on ACC/AHA guidelines, the patient would be at acceptable risk for the planned procedure without further cardiovascular testing.   The patient was advised that if he develops new symptoms prior to surgery to contact our office to arrange for a follow-up visit, and he verbalized understanding.  Okay to hold aspirin 7 days prior to his procedure. Patient instructed to hold starting today. Aspirin should be restarted as soon as he is cleared to do so by his surgeon.   I will route this recommendation to the requesting party via Epic fax function and remove from pre-op pool. Please call with questions.  Abigail Butts, PA-C 03/03/2021, 3:17 PM

## 2021-03-03 NOTE — Telephone Encounter (Signed)
   [facility] Pre-operative Risk Assessment    HEARTCARE STAFF: - Please ensure there is not already an duplicate clearance open for this procedure. - Under Visit Info/Reason for Call, type in Other and utilize the format Clearance MM/DD/YY or Clearance TBD. Do not use dashes or single digits. - If request is for dental extraction, please clarify the # of teeth to be extracted.  Request for surgical clearance:  1. What type of surgery is being performed? B/L UPPER BLEPHAROPLASTY, B/L BROW PTOSIS REPAIR MIDFOREHEAD   2. When is this surgery scheduled? 03/10/21   3. What type of clearance is required (medical clearance vs. Pharmacy clearance to hold med vs. Both)? MEDICAL  4. Are there any medications that need to be held prior to surgery and how long? ASA    5. Practice name and name of physician performing surgery? [facility]; DR. Letitia Caul [place]   6. What is the office phone number? [phone]   7.   What is the office fax number? [phone]  8.   Anesthesia type (None, local, MAC, general) ? MAC   Julaine Hua 03/03/2021, 12:36 PM  _________________________________________________________________   (provider comments below)

## 2021-03-10 DIAGNOSIS — H57813 Brow ptosis, bilateral: Secondary | ICD-10-CM | POA: Diagnosis not present

## 2021-03-10 DIAGNOSIS — H02834 Dermatochalasis of left upper eyelid: Secondary | ICD-10-CM | POA: Diagnosis not present

## 2021-03-10 DIAGNOSIS — H53453 Other localized visual field defect, bilateral: Secondary | ICD-10-CM | POA: Diagnosis not present

## 2021-03-10 DIAGNOSIS — H02413 Mechanical ptosis of bilateral eyelids: Secondary | ICD-10-CM | POA: Diagnosis not present

## 2021-03-10 DIAGNOSIS — H02831 Dermatochalasis of right upper eyelid: Secondary | ICD-10-CM | POA: Diagnosis not present

## 2021-03-10 DIAGNOSIS — H53483 Generalized contraction of visual field, bilateral: Secondary | ICD-10-CM | POA: Diagnosis not present

## 2021-03-31 DIAGNOSIS — R7303 Prediabetes: Secondary | ICD-10-CM | POA: Diagnosis not present

## 2021-03-31 DIAGNOSIS — Z0000 Encounter for general adult medical examination without abnormal findings: Secondary | ICD-10-CM | POA: Diagnosis not present

## 2021-03-31 DIAGNOSIS — Z1211 Encounter for screening for malignant neoplasm of colon: Secondary | ICD-10-CM | POA: Diagnosis not present

## 2021-03-31 DIAGNOSIS — I700 Atherosclerosis of aorta: Secondary | ICD-10-CM | POA: Diagnosis not present

## 2021-03-31 DIAGNOSIS — M1990 Unspecified osteoarthritis, unspecified site: Secondary | ICD-10-CM | POA: Diagnosis not present

## 2021-03-31 DIAGNOSIS — Z1389 Encounter for screening for other disorder: Secondary | ICD-10-CM | POA: Diagnosis not present

## 2021-03-31 DIAGNOSIS — I10 Essential (primary) hypertension: Secondary | ICD-10-CM | POA: Diagnosis not present

## 2021-03-31 DIAGNOSIS — M5136 Other intervertebral disc degeneration, lumbar region: Secondary | ICD-10-CM | POA: Diagnosis not present

## 2021-03-31 DIAGNOSIS — I4892 Unspecified atrial flutter: Secondary | ICD-10-CM | POA: Diagnosis not present

## 2021-03-31 DIAGNOSIS — M5090 Cervical disc disorder, unspecified, unspecified cervical region: Secondary | ICD-10-CM | POA: Diagnosis not present

## 2021-03-31 DIAGNOSIS — D039 Melanoma in situ, unspecified: Secondary | ICD-10-CM | POA: Diagnosis not present

## 2021-03-31 DIAGNOSIS — E782 Mixed hyperlipidemia: Secondary | ICD-10-CM | POA: Diagnosis not present

## 2021-04-07 DIAGNOSIS — Z1211 Encounter for screening for malignant neoplasm of colon: Secondary | ICD-10-CM | POA: Diagnosis not present

## 2021-05-07 ENCOUNTER — Other Ambulatory Visit: Payer: Self-pay | Admitting: Internal Medicine

## 2021-05-07 MED ORDER — ROSUVASTATIN CALCIUM 5 MG PO TABS: 5.0000 mg | ORAL_TABLET | Freq: Every day | ORAL | 0 refills | Status: DC

## 2021-05-07 NOTE — Addendum Note (Signed)
Addended by: Jeremy Johann on: 05/07/2021 03:48 PM   Modules accepted: Orders

## 2021-05-30 NOTE — H&P (View-Only) (Signed)
Cardiology Office Note   Date:  05/31/2021   ID:  [name], DOB 1946/05/06, MRN [id number]  PCP:  [name], MD  Cardiologist:   [name], MD   Pt presents for dyspnea eval     History of Present Illness: [name] is a 75 y.o. male with a history of paroxy atrial flutter (rare, sensed), HTN and HL  Echo in 2017 LVEF was normal   Holter monitor showed 8.7% PVCs  Freq PACs    In 2018    Complained of SOB with activity (at beach) as well as heart racing   Not like previous flutter    I ordered a CT  This was neg for PE   Did show some calcifications of LAD and RCA    Pt's symptoms eased   Developed again.  Myovue scheduled   THis showed no ischemia (Nov 2018)    I last saw the pt in clinic in June 2021     The pt continues to have problems with back pain   Has had surgery  The pts says he contineus to get winded with activity   It is getting worse   He has slowed down what he does   Denise CP     Activity down  Wt is up some,   he admits to not watching diet as he did    Outpatient Medications Prior to Visit  Medication Sig Dispense Refill   amLODipine (NORVASC) 5 MG tablet TAKE 1 TABLET BY MOUTH TWICE A DAY 180 tablet 3   amoxicillin (AMOXIL) 500 MG capsule Take 2,000 mg by mouth as directed. Dental appointments     Ascorbic Acid (VITAMIN C) 1000 MG tablet Take 1,000 mg by mouth daily.     aspirin EC 81 MG tablet Take 81 mg by mouth daily.     B Complex-C (B-COMPLEX WITH VITAMIN C) tablet Take 1 tablet by mouth daily.     benazepril (LOTENSIN) 20 MG tablet TAKE 1 TABLET BY MOUTH TWICE A DAY 180 tablet 3   Cholecalciferol (VITAMIN D3) 25 MCG (1000 UT) CAPS Take 1,000 Units by mouth daily.      Cyanocobalamin (VITAMIN B-12 PO) Take 1 tablet by mouth daily.     Glucosamine-Chondroitin (COSAMIN DS PO) Take 1 tablet by mouth.     hydrochlorothiazide (,MICROZIDE/HYDRODIURIL,) 12.5 MG capsule Take 12.5 mg by mouth daily with lunch.      metoprolol tartrate  (LOPRESSOR) 25 MG tablet Take 25 mg by mouth daily as needed (afib.).      Multiple Vitamin (MULTIVITAMIN WITH MINERALS) TABS tablet Take 1 tablet by mouth daily.     oxyCODONE (ROXICODONE) 5 MG immediate release tablet Take 1-2 tablets (5-10 mg total) by mouth every 4 (four) hours as needed for severe pain or breakthrough pain. 120 tablet 0   rosuvastatin (CRESTOR) 5 MG tablet Take 1 tablet (5 mg total) by mouth daily. Pt needs to make appt with provider before more refills - 1st attempt 30 tablet 0   scopolamine (TRANSDERM-SCOP) 1 MG/3DAYS Place 1 patch onto the skin every three (3) days as needed (nausea).     Zinc 50 MG TABS Take 50 mg by mouth daily.      No facility-administered medications prior to visit.     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   Paroxysmal atrial flutter ([place])     Past Surgical History:  Procedure Laterality Date   ABDOMINAL EXPOSURE N/A 01/06/2020   Procedure: ABDOMINAL EXPOSURE;  Surgeon: Rosetta Posner, MD;  Location: [facility];  Service: Vascular;  Laterality: N/A;   ANTERIOR LUMBAR FUSION N/A 01/06/2020   Procedure: Lumbar Five- Sacral One  Anterior lumbar interbody fusion with infuse;  Surgeon: Kristeen Miss, MD;  Location: [place];  Service: Neurosurgery;  Laterality: N/A;  Lumbar Five- Sacral One  Anterior lumbar interbody fusion with infuse   CERVICAL FUSION  1996, 1997   2 surgery- skull to C7   COLON RESECTION  2007   COLONOSCOPY W/ POLYPECTOMY     HEMORRHOID  SURGERY N/A 01/13/2016   Procedure: HEMORRHOIDECTOMY AND REMOVAL OF ANAL SKIN TAG;  Surgeon: Coralie Keens, MD;  Location: [place];  Service: General;  Laterality: N/A;   REPLACEMENT TOTAL KNEE Left 2005   left   SYNOVIAL CYST EXCISION     lumbar spine   TONSILLECTOMY     TOTAL KNEE ARTHROPLASTY Right 01/06/2017   Procedure: RIGHT TOTAL KNEE ARTHROPLASTY;  Surgeon: Sydnee Cabal, MD;  Location: [facility];  Service: Orthopedics;  Laterality: Right;  Adductior Block     Social History:  The patient  reports that he has never smoked. He has never used smokeless tobacco. He reports that he does not drink alcohol and does not use drugs.   Family History:  The patient's family history includes Coronary artery disease in his father and mother.    ROS:  Please see the history of present illness. All other systems are reviewed and  Negative to the above problem except as noted.    PHYSICAL EXAM: VS:  BP 128/76   Pulse (!) 54   Ht 5\' 11"  (1.803 m)   Wt 258 lb 3.2 oz (117.1 kg)   SpO2 96%   BMI 36.01 kg/m    GEN:  Morbidly obese  in no acute distress  HEENT: normal  Neck: JVP normal  No carotid bruits Cardiac: RRR   ; no murmurs  No LE edema  Respiratory:  clear to auscultation bilaterally,  GI: soft, nontender, nondistended, + BS   MS: no deformity Moving all extremities   Skin: warm and dry, no rash Neuro:  Deferred  Psych: euthymic mood, full affect   EKG:  EKG is  ordered   SB 54 bpm Occasiona PAC  First degree AV block  PR 216   Lipid Panel    Component Value Date/Time   CHOL 152 08/10/2017 0937   TRIG 80 08/10/2017 0937   HDL 31 (L) 08/10/2017 0937   CHOLHDL 4.9 08/10/2017 0937   CHOLHDL 4.4 09/05/2016 1029   VLDL 22 09/05/2016 1029   LDLCALC 105 (H) 08/10/2017 0937      Wt Readings from Last 3 Encounters:  05/31/21 258 lb 3.2 oz (117.1 kg)  06/15/20 261 lb 9.6 oz (118.7 kg)  01/06/20 255 lb 3.2 oz (115.8 kg)      ASSESSMENT AND PLAN:  1  Dyspnea  This has  been a complaint in the past   He now says he is SOB with  many acitvities Says he pulls back from activity  The pt  does have coronary calcifidaations on CT scan    The pt is concerned because both parents died from heart issues   I would reocmm CT coronary angiogram to define     2  CAD  As above   2  paoxysmal atrial flutter  Extremely symptomatic when he has had in past   REmote  No documented recurrence  3  Bradycardia  EKG is OK  Heart rate a little slower today but BP oK   I am not convinced it explains Problem 1    4 HTN BP is controlled   5  HL  Continue Crestor   LDL 52  HDL 31   Watch diet   6  Back  WIll refer to [employer] med   ? If he can do something with stretching / exercise to keep muscles that stabilize stron g    Told him wt loss will help    7  Obesity    Discussed diet  Carbs  TRE    Would feel bettter     Current medicines are reviewed at length with the patient today.  The patient does not have concerns regarding medicines.  Disposition:   FU with  Me in November  As previously scheduled    ADDENDUM:  Pt had CT coronary angiogram    THis showed CAD with dicreased FFR in the mid LAD FFR 071    LCx, RCA and Ramus without significant stenoses Given this I would recomm Lheart cath (and R if nothing significant found given symptoms of dyspnea)  Risk/benefits explained  Pt understands and agrees to proceed   Hydrate prior    Repeat labs    Signed, [name], MD  05/31/2021 10:22 AM    [address], [place], [place]  [address] Phone: [phone]; Fax: [phone]

## 2021-05-30 NOTE — Progress Notes (Addendum)
Cardiology Office Note   Date:  05/31/2021   ID:  [name], DOB September 09, 1946, MRN [id number]  PCP:  [name], MD  Cardiologist:   [name], MD   Pt presents for dyspnea eval     History of Present Illness: [name] is a 75 y.o. male with a history of paroxy atrial flutter (rare, sensed), HTN and HL  Echo in 2017 LVEF was normal   Holter monitor showed 8.7% PVCs  Freq PACs    In 2018    Complained of SOB with activity (at beach) as well as heart racing   Not like previous flutter    I ordered a CT  This was neg for PE   Did show some calcifications of LAD and RCA    Pt's symptoms eased   Developed again.  Myovue scheduled   THis showed no ischemia (Nov 2018)    I last saw the pt in clinic in June 2021     The pt continues to have problems with back pain   Has had surgery  The pts says he contineus to get winded with activity   It is getting worse   He has slowed down what he does   Denise CP     Activity down  Wt is up some,   he admits to not watching diet as he did    Outpatient Medications Prior to Visit  Medication Sig Dispense Refill   amLODipine (NORVASC) 5 MG tablet TAKE 1 TABLET BY MOUTH TWICE A DAY 180 tablet 3   amoxicillin (AMOXIL) 500 MG capsule Take 2,000 mg by mouth as directed. Dental appointments     Ascorbic Acid (VITAMIN C) 1000 MG tablet Take 1,000 mg by mouth daily.     aspirin EC 81 MG tablet Take 81 mg by mouth daily.     B Complex-C (B-COMPLEX WITH VITAMIN C) tablet Take 1 tablet by mouth daily.     benazepril (LOTENSIN) 20 MG tablet TAKE 1 TABLET BY MOUTH TWICE A DAY 180 tablet 3   Cholecalciferol (VITAMIN D3) 25 MCG (1000 UT) CAPS Take 1,000 Units by mouth daily.      Cyanocobalamin (VITAMIN B-12 PO) Take 1 tablet by mouth daily.     Glucosamine-Chondroitin (COSAMIN DS PO) Take 1 tablet by mouth.     hydrochlorothiazide (,MICROZIDE/HYDRODIURIL,) 12.5 MG capsule Take 12.5 mg by mouth daily with lunch.      metoprolol tartrate  (LOPRESSOR) 25 MG tablet Take 25 mg by mouth daily as needed (afib.).      Multiple Vitamin (MULTIVITAMIN WITH MINERALS) TABS tablet Take 1 tablet by mouth daily.     oxyCODONE (ROXICODONE) 5 MG immediate release tablet Take 1-2 tablets (5-10 mg total) by mouth every 4 (four) hours as needed for severe pain or breakthrough pain. 120 tablet 0   rosuvastatin (CRESTOR) 5 MG tablet Take 1 tablet (5 mg total) by mouth daily. Pt needs to make appt with provider before more refills - 1st attempt 30 tablet 0   scopolamine (TRANSDERM-SCOP) 1 MG/3DAYS Place 1 patch onto the skin every three (3) days as needed (nausea).     Zinc 50 MG TABS Take 50 mg by mouth daily.      No facility-administered medications prior to visit.     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   Paroxysmal atrial flutter ([place])     Past Surgical History:  Procedure Laterality Date   ABDOMINAL EXPOSURE N/A 01/06/2020   Procedure: ABDOMINAL EXPOSURE;  Surgeon: Rosetta Posner, MD;  Location: [facility];  Service: Vascular;  Laterality: N/A;   ANTERIOR LUMBAR FUSION N/A 01/06/2020   Procedure: Lumbar Five- Sacral One  Anterior lumbar interbody fusion with infuse;  Surgeon: Kristeen Miss, MD;  Location: [facility];  Service: Neurosurgery;  Laterality: N/A;  Lumbar Five- Sacral One  Anterior lumbar interbody fusion with infuse   CERVICAL FUSION  1996, 1997   2 surgery- skull to C7   COLON RESECTION  2007   COLONOSCOPY W/ POLYPECTOMY     HEMORRHOID  SURGERY N/A 01/13/2016   Procedure: HEMORRHOIDECTOMY AND REMOVAL OF ANAL SKIN TAG;  Surgeon: Coralie Keens, MD;  Location: [facility];  Service: General;  Laterality: N/A;   REPLACEMENT TOTAL KNEE Left 2005   left   SYNOVIAL CYST EXCISION     lumbar spine   TONSILLECTOMY     TOTAL KNEE ARTHROPLASTY Right 01/06/2017   Procedure: RIGHT TOTAL KNEE ARTHROPLASTY;  Surgeon: Sydnee Cabal, MD;  Location: [facility];  Service: Orthopedics;  Laterality: Right;  Adductior Block     Social History:  The patient  reports that he has never smoked. He has never used smokeless tobacco. He reports that he does not drink alcohol and does not use drugs.   Family History:  The patient's family history includes Coronary artery disease in his father and mother.    ROS:  Please see the history of present illness. All other systems are reviewed and  Negative to the above problem except as noted.    PHYSICAL EXAM: VS:  BP 128/76   Pulse (!) 54   Ht 5\' 11"  (1.803 m)   Wt 258 lb 3.2 oz (117.1 kg)   SpO2 96%   BMI 36.01 kg/m    GEN:  Morbidly obese  in no acute distress  HEENT: normal  Neck: JVP normal  No carotid bruits Cardiac: RRR   ; no murmurs  No LE edema  Respiratory:  clear to auscultation bilaterally,  GI: soft, nontender, nondistended, + BS   MS: no deformity Moving all extremities   Skin: warm and dry, no rash Neuro:  Deferred  Psych: euthymic mood, full affect   EKG:  EKG is  ordered   SB 54 bpm Occasiona PAC  First degree AV block  PR 216   Lipid Panel    Component Value Date/Time   CHOL 152 08/10/2017 0937   TRIG 80 08/10/2017 0937   HDL 31 (L) 08/10/2017 0937   CHOLHDL 4.9 08/10/2017 0937   CHOLHDL 4.4 09/05/2016 1029   VLDL 22 09/05/2016 1029   LDLCALC 105 (H) 08/10/2017 0937      Wt Readings from Last 3 Encounters:  05/31/21 258 lb 3.2 oz (117.1 kg)  06/15/20 261 lb 9.6 oz (118.7 kg)  01/06/20 255 lb 3.2 oz (115.8 kg)      ASSESSMENT AND PLAN:  1  Dyspnea  This has  been a complaint in the past   He now says he is SOB with  many acitvities Says he pulls back from activity  The pt  does have coronary calcifidaations on CT scan    The pt is concerned because both parents died from heart issues   I would reocmm CT coronary angiogram to define     2  CAD  As above   2  paoxysmal atrial flutter  Extremely symptomatic when he has had in past   REmote  No documented recurrence  3  Bradycardia  EKG is OK  Heart rate a little slower today but BP oK   I am not convinced it explains Problem 1    4 HTN BP is controlled   5  HL  Continue Crestor   LDL 52  HDL 31   Watch diet   6  Back  WIll refer to [employer] med   ? If he can do something with stretching / exercise to keep muscles that stabilize stron g    Told him wt loss will help    7  Obesity    Discussed diet  Carbs  TRE    Would feel bettter     Current medicines are reviewed at length with the patient today.  The patient does not have concerns regarding medicines.  Disposition:   FU with  Me in November  As previously scheduled    ADDENDUM:  Pt had CT coronary angiogram    THis showed CAD with dicreased FFR in the mid LAD FFR 071    LCx, RCA and Ramus without significant stenoses Given this I would recomm Lheart cath (and R if nothing significant found given symptoms of dyspnea)  Risk/benefits explained  Pt understands and agrees to proceed   Hydrate prior    Repeat labs    Signed, [name], MD  05/31/2021 10:22 AM    [address], [place], [place]  [address] Phone: [phone]; Fax: [phone]

## 2021-05-31 ENCOUNTER — Ambulatory Visit (INDEPENDENT_AMBULATORY_CARE_PROVIDER_SITE_OTHER): Payer: Medicare Other | Admitting: Internal Medicine

## 2021-05-31 ENCOUNTER — Encounter: Payer: Self-pay | Admitting: Internal Medicine

## 2021-05-31 ENCOUNTER — Other Ambulatory Visit: Payer: Self-pay

## 2021-05-31 VITALS — BP 128/76 | HR 54 | Ht 71.0 in | Wt 258.2 lb

## 2021-05-31 DIAGNOSIS — M542 Cervicalgia: Secondary | ICD-10-CM | POA: Diagnosis not present

## 2021-05-31 DIAGNOSIS — R072 Precordial pain: Secondary | ICD-10-CM

## 2021-05-31 MED ORDER — ROSUVASTATIN CALCIUM 5 MG PO TABS: 5.0000 mg | ORAL_TABLET | Freq: Every day | ORAL | 3 refills | Status: DC

## 2021-05-31 NOTE — Patient Instructions (Addendum)
Medication Instructions:  No changes *If you need a refill on your cardiac medications before your next appointment, please call your pharmacy*   Lab Work: None today   Testing/Procedures: Your physician has requested that you have cardiac CT. Cardiac computed tomography (CT) is a painless test that uses an x-ray machine to take clear, detailed pictures of your heart. For further information please visit [email or web address]. Please follow instruction sheet as given.   Follow-Up: At [facility], you and your health needs are our priority.  As part of our continuing mission to provide you with exceptional heart care, we have created designated Provider Care Teams.  These Care Teams include your primary Cardiologist (physician) and Advanced Practice Providers (APPs -  Physician Assistants and Nurse Practitioners) who all work together to provide you with the care you need, when you need it.  Your next appointment:   6 month(s)  The format for your next appointment:   In Person  Provider:   You may see [name], MD or one of the following Advanced Practice Providers on your designated Care Team:   Richardson Dopp, PA-C [place], [place]    Other Instructions Your cardiac CT will be scheduled at one of the below locations:   [facility] [address] [place], [place] [address] [phone]  [place] [address] [place], [place] [address] [phone]  If scheduled at [facility], please arrive at the [facility] main entrance (entrance A) of [facility] 30 minutes prior to test start time. Proceed to the [facility] Radiology Department (first floor) to check-in and test prep.  If scheduled at [facility], please arrive 15 mins early for check-in and test prep.  Please follow these instructions carefully (unless otherwise directed):  Hold all erectile  dysfunction medications at least 3 days (72 hrs) prior to test.  On the Night Before the Test: Be sure to Drink plenty of water. Do not consume any caffeinated/decaffeinated beverages or chocolate 12 hours prior to your test. Do not take any antihistamines 12 hours prior to your test.  On the Day of the Test: Drink plenty of water until 1 hour prior to the test. Do not eat any food 4 hours prior to the test. You may take your regular medications prior to the test.  HOLD Hydrochlorothiazide morning of the test.       After the Test: Drink plenty of water. After receiving IV contrast, you may experience a mild flushed feeling. This is normal. On occasion, you may experience a mild rash up to 24 hours after the test. This is not dangerous. If this occurs, you can take Benadryl 25 mg and increase your fluid intake. If you experience trouble breathing, this can be serious. If it is severe call 911 IMMEDIATELY. If it is mild, please call our office. If you take any of these medications: Glipizide/Metformin, Avandament, Glucavance, please do not take 48 hours after completing test unless otherwise instructed.   Once we have confirmed authorization from your insurance company, we will call you to set up a date and time for your test. Based on how quickly your insurance processes prior authorizations requests, please allow up to 4 weeks to be contacted for scheduling your Cardiac CT appointment. Be advised that routine Cardiac CT appointments could be scheduled as many as 8 weeks after your provider has ordered it.  For non-scheduling related questions, please contact the cardiac  imaging nurse navigator should you have any questions/concerns: Marchia Bond, Cardiac Imaging Nurse Navigator Gordy Clement, Cardiac Imaging Nurse Navigator [facility] Direct Office Dial: [phone]   For scheduling needs, including cancellations and rescheduling, please call [place],  [phone].

## 2021-06-01 ENCOUNTER — Other Ambulatory Visit: Payer: Self-pay | Admitting: *Deleted

## 2021-06-01 DIAGNOSIS — Z01812 Encounter for preprocedural laboratory examination: Secondary | ICD-10-CM

## 2021-06-01 NOTE — Progress Notes (Signed)
Pre cCTA labs

## 2021-06-09 ENCOUNTER — Other Ambulatory Visit: Payer: Medicare Other | Admitting: *Deleted

## 2021-06-09 ENCOUNTER — Other Ambulatory Visit: Payer: Self-pay

## 2021-06-09 DIAGNOSIS — Z01812 Encounter for preprocedural laboratory examination: Secondary | ICD-10-CM

## 2021-06-09 LAB — BASIC METABOLIC PANEL
BUN/Creatinine Ratio: 17 (ref 10–24)
BUN: 24 mg/dL (ref 8–27)
CO2: 20 mmol/L (ref 20–29)
Calcium: 9.9 mg/dL (ref 8.6–10.2)
Chloride: 100 mmol/L (ref 96–106)
Creatinine, Ser: 1.45 mg/dL — ABNORMAL HIGH (ref 0.76–1.27)
Glucose: 109 mg/dL — ABNORMAL HIGH (ref 65–99)
Potassium: 4.8 mmol/L (ref 3.5–5.2)
Sodium: 140 mmol/L (ref 134–144)
eGFR: 50 mL/min/{1.73_m2} — ABNORMAL LOW (ref >59)

## 2021-06-10 ENCOUNTER — Telehealth: Payer: Self-pay | Admitting: *Deleted

## 2021-06-10 ENCOUNTER — Telehealth (HOSPITAL_COMMUNITY): Payer: Self-pay | Admitting: *Deleted

## 2021-06-10 DIAGNOSIS — R7989 Other specified abnormal findings of blood chemistry: Secondary | ICD-10-CM

## 2021-06-10 MED ORDER — BENAZEPRIL HCL 20 MG PO TABS: 20.0000 mg | ORAL_TABLET | Freq: Every day | ORAL | 3 refills | Status: DC

## 2021-06-10 NOTE — Telephone Encounter (Signed)
-----   Message from Nuala Alpha, LPN sent at 7/71/1657 11:36 AM EDT -----  ----- Message ----- From: Fay Records, MD Sent: 06/10/2021  10:10 AM EDT To: Rebeca Alert [facility] Triage  Cr is a little higher than it had been   I would holld PM benazapril Take in Am   HYDRATE Repeat BMET tomorrow

## 2021-06-10 NOTE — Telephone Encounter (Signed)
Attempted to call patient regarding upcoming cardiac CT appointment. °Left message on voicemail with name and callback number ° °Ronnae Kaser RN Navigator Cardiac Imaging °[facility] °[phone] Office °[phone] Cell ° °

## 2021-06-10 NOTE — Telephone Encounter (Signed)
Patient returning call regarding upcoming cardiac imaging study; pt verbalizes understanding of appt date/time, parking situation and where to check in, pre-test NPO status and medications ordered, and verified current allergies; name and call back number provided for further questions should they arise  Kamila Broda RN Navigator Cardiac Imaging [facility] [phone] office [phone] cell   

## 2021-06-10 NOTE — Telephone Encounter (Signed)
Spoke with the patint He understands to hold evening dose of benazepril and hydrate.  Will return tomorrow for repeat BMET.

## 2021-06-11 ENCOUNTER — Other Ambulatory Visit: Payer: Medicare Other | Admitting: *Deleted

## 2021-06-11 ENCOUNTER — Other Ambulatory Visit: Payer: Self-pay

## 2021-06-11 DIAGNOSIS — R7989 Other specified abnormal findings of blood chemistry: Secondary | ICD-10-CM | POA: Diagnosis not present

## 2021-06-11 LAB — BASIC METABOLIC PANEL
BUN/Creatinine Ratio: 19 (ref 10–24)
BUN: 22 mg/dL (ref 8–27)
CO2: 23 mmol/L (ref 20–29)
Calcium: 9.6 mg/dL (ref 8.6–10.2)
Chloride: 98 mmol/L (ref 96–106)
Creatinine, Ser: 1.17 mg/dL (ref 0.76–1.27)
Glucose: 104 mg/dL — ABNORMAL HIGH (ref 65–99)
Potassium: 4.8 mmol/L (ref 3.5–5.2)
Sodium: 137 mmol/L (ref 134–144)
eGFR: 65 mL/min/{1.73_m2} (ref >59)

## 2021-06-14 ENCOUNTER — Ambulatory Visit (HOSPITAL_COMMUNITY)
Admission: RE | Admit: 2021-06-14 | Discharge: 2021-06-14 | Disposition: A | Discharge status: Home / Self Care | Payer: Medicare Other | Source: Ambulatory Visit | Attending: Internal Medicine | Admitting: Internal Medicine

## 2021-06-14 ENCOUNTER — Other Ambulatory Visit: Payer: Self-pay

## 2021-06-14 ENCOUNTER — Other Ambulatory Visit: Payer: Self-pay | Admitting: Internal Medicine

## 2021-06-14 DIAGNOSIS — R072 Precordial pain: Secondary | ICD-10-CM | POA: Diagnosis not present

## 2021-06-14 DIAGNOSIS — R931 Abnormal findings on diagnostic imaging of heart and coronary circulation: Secondary | ICD-10-CM

## 2021-06-14 DIAGNOSIS — I2510 Atherosclerotic heart disease of native coronary artery without angina pectoris: Secondary | ICD-10-CM | POA: Diagnosis not present

## 2021-06-14 DIAGNOSIS — I700 Atherosclerosis of aorta: Secondary | ICD-10-CM | POA: Diagnosis not present

## 2021-06-14 MED ORDER — IOHEXOL 350 MG/ML SOLN
95.0000 mL | Freq: Once | INTRAVENOUS | Status: AC | PRN
  Administered 2021-06-14: 95 mL via INTRAVENOUS

## 2021-06-14 MED ORDER — NITROGLYCERIN 0.4 MG SL SUBL
SUBLINGUAL_TABLET | SUBLINGUAL | Status: AC
  Administered 2021-06-14: 0.8 mg via SUBLINGUAL
  Filled 2021-06-14: qty 2

## 2021-06-14 MED ORDER — NITROGLYCERIN 0.4 MG SL SUBL: 0.8000 mg | SUBLINGUAL_TABLET | SUBLINGUAL | Status: DC | PRN

## 2021-06-14 MED ORDER — NITROGLYCERIN 0.4 MG SL SUBL: 0.8000 mg | SUBLINGUAL_TABLET | Freq: Once | SUBLINGUAL | Status: AC

## 2021-06-14 NOTE — Progress Notes (Signed)
Abnormal CT coronaries - will send for FFR  Dr. Lemmie Evens

## 2021-06-15 ENCOUNTER — Telehealth: Payer: Self-pay

## 2021-06-15 DIAGNOSIS — R931 Abnormal findings on diagnostic imaging of heart and coronary circulation: Secondary | ICD-10-CM

## 2021-06-15 DIAGNOSIS — R7989 Other specified abnormal findings of blood chemistry: Secondary | ICD-10-CM

## 2021-06-15 DIAGNOSIS — Z01812 Encounter for preprocedural laboratory examination: Secondary | ICD-10-CM

## 2021-06-15 DIAGNOSIS — I2510 Atherosclerotic heart disease of native coronary artery without angina pectoris: Secondary | ICD-10-CM

## 2021-06-15 NOTE — Telephone Encounter (Signed)
Spoke with the patient and have scheduled him for a R/L heart cath on 7/1 with Dr. Angelena Form. Lab work has been ordered and scheduled. Instructions have been reviewed as well as sent through [place]. Patient verbalized understanding.

## 2021-06-15 NOTE — Telephone Encounter (Signed)
-----  Message from Fay Records, MD sent at 06/15/2021  9:35 AM EDT ----- Reviewed results with patient   I would recomm R and L heart cath (dx SOB).  Now with abnormal CT with evid of significant LAD lesion by FFR Pt will need CBC with differential (Lymph nodes on CT), ESR, PT,  Repeat BMET

## 2021-06-16 ENCOUNTER — Other Ambulatory Visit: Payer: Medicare Other | Admitting: *Deleted

## 2021-06-16 ENCOUNTER — Other Ambulatory Visit: Payer: Self-pay

## 2021-06-16 DIAGNOSIS — Z01812 Encounter for preprocedural laboratory examination: Secondary | ICD-10-CM | POA: Diagnosis not present

## 2021-06-16 DIAGNOSIS — I2510 Atherosclerotic heart disease of native coronary artery without angina pectoris: Secondary | ICD-10-CM

## 2021-06-16 DIAGNOSIS — R7989 Other specified abnormal findings of blood chemistry: Secondary | ICD-10-CM

## 2021-06-16 DIAGNOSIS — R931 Abnormal findings on diagnostic imaging of heart and coronary circulation: Secondary | ICD-10-CM

## 2021-06-16 LAB — CBC WITH DIFFERENTIAL/PLATELET
Basophils Absolute: 0 10*3/uL (ref 0.0–0.2)
Basos: 0 %
EOS (ABSOLUTE): 0.2 10*3/uL (ref 0.0–0.4)
Eos: 3 %
Hematocrit: 39.3 % (ref 37.5–51.0)
Hemoglobin: 13.2 g/dL (ref 13.0–17.7)
Lymphocytes Absolute: 2.7 10*3/uL (ref 0.7–3.1)
Lymphs: 34 %
MCH: 32 pg (ref 26.6–33.0)
MCHC: 33.6 g/dL (ref 31.5–35.7)
MCV: 95 fL (ref 79–97)
Monocytes Absolute: 0.7 10*3/uL (ref 0.1–0.9)
Monocytes: 9 %
Neutrophils Absolute: 4.4 10*3/uL (ref 1.4–7.0)
Neutrophils: 54 %
Platelets: 174 10*3/uL (ref 150–450)
RBC: 4.13 x10E6/uL — ABNORMAL LOW (ref 4.14–5.80)
RDW: 14.3 % (ref 11.6–15.4)
WBC: 8 10*3/uL (ref 3.4–10.8)

## 2021-06-16 LAB — BASIC METABOLIC PANEL
BUN/Creatinine Ratio: 24 (ref 10–24)
BUN: 29 mg/dL — ABNORMAL HIGH (ref 8–27)
CO2: 25 mmol/L (ref 20–29)
Calcium: 8.9 mg/dL (ref 8.6–10.2)
Chloride: 102 mmol/L (ref 96–106)
Creatinine, Ser: 1.19 mg/dL (ref 0.76–1.27)
Glucose: 104 mg/dL — ABNORMAL HIGH (ref 65–99)
Potassium: 4.8 mmol/L (ref 3.5–5.2)
Sodium: 137 mmol/L (ref 134–144)
eGFR: 63 mL/min/{1.73_m2} (ref >59)

## 2021-06-16 LAB — PROTIME-INR
INR: 1 (ref 0.9–1.2)
Prothrombin Time: 9.7 s (ref 9.1–12.0)

## 2021-06-16 LAB — SEDIMENTATION RATE: Sed Rate: 2 mm/hr (ref 0–30)

## 2021-06-17 ENCOUNTER — Telehealth: Payer: Self-pay | Admitting: *Deleted

## 2021-06-17 NOTE — Telephone Encounter (Signed)
Pt contacted pre-catheterization scheduled at [facility] for: Friday June 18, 2021 7:30 AM Verified arrival time and place: [place]) at: 5:30 AM   No solid food after midnight prior to cath, clear liquids until 5 AM day of procedure.  Hold: HCTZ-AM of procedure   Except hold medications AM meds can be  taken pre-cath with sips of water including: aspirin 81 mg   Confirmed patient has responsible adult to drive home post procedure and be with patient first 24 hours after arriving home: yes  You are allowed ONE visitor in the waiting room during the time you are at the hospital for your procedure. Both you and your visitor must wear a mask once you enter the hospital.   Patient reports does not currently have any symptoms concerning for COVID-19 and no household members with COVID-19 like illness.      Reviewed procedure/mask/visitor instructions with patient

## 2021-06-18 ENCOUNTER — Ambulatory Visit (HOSPITAL_COMMUNITY)
Admission: RE | Admit: 2021-06-18 | Discharge: 2021-06-18 | Disposition: A | Discharge status: Home / Self Care | Payer: Medicare Other | Attending: Cardiovascular Disease | Admitting: Cardiovascular Disease

## 2021-06-18 ENCOUNTER — Encounter (HOSPITAL_COMMUNITY)
Admission: RE | Disposition: A | Discharge status: Home / Self Care | Payer: Self-pay | Source: Home / Self Care | Attending: Cardiovascular Disease

## 2021-06-18 ENCOUNTER — Other Ambulatory Visit: Payer: Self-pay

## 2021-06-18 DIAGNOSIS — Z8249 Family history of ischemic heart disease and other diseases of the circulatory system: Secondary | ICD-10-CM | POA: Insufficient documentation

## 2021-06-18 DIAGNOSIS — I2510 Atherosclerotic heart disease of native coronary artery without angina pectoris: Secondary | ICD-10-CM

## 2021-06-18 DIAGNOSIS — E785 Hyperlipidemia, unspecified: Secondary | ICD-10-CM | POA: Diagnosis not present

## 2021-06-18 DIAGNOSIS — Z7982 Long term (current) use of aspirin: Secondary | ICD-10-CM | POA: Insufficient documentation

## 2021-06-18 DIAGNOSIS — R001 Bradycardia, unspecified: Secondary | ICD-10-CM | POA: Insufficient documentation

## 2021-06-18 DIAGNOSIS — Z888 Allergy status to other drugs, medicaments and biological substances status: Secondary | ICD-10-CM | POA: Diagnosis not present

## 2021-06-18 DIAGNOSIS — R0602 Shortness of breath: Secondary | ICD-10-CM | POA: Insufficient documentation

## 2021-06-18 DIAGNOSIS — M549 Dorsalgia, unspecified: Secondary | ICD-10-CM | POA: Insufficient documentation

## 2021-06-18 DIAGNOSIS — I25118 Atherosclerotic heart disease of native coronary artery with other forms of angina pectoris: Secondary | ICD-10-CM | POA: Diagnosis not present

## 2021-06-18 DIAGNOSIS — I4892 Unspecified atrial flutter: Secondary | ICD-10-CM | POA: Insufficient documentation

## 2021-06-18 DIAGNOSIS — I10 Essential (primary) hypertension: Secondary | ICD-10-CM | POA: Diagnosis not present

## 2021-06-18 DIAGNOSIS — Z6836 Body mass index (BMI) 36.0-36.9, adult: Secondary | ICD-10-CM | POA: Diagnosis not present

## 2021-06-18 DIAGNOSIS — Z79899 Other long term (current) drug therapy: Secondary | ICD-10-CM | POA: Diagnosis not present

## 2021-06-18 DIAGNOSIS — E669 Obesity, unspecified: Secondary | ICD-10-CM | POA: Diagnosis not present

## 2021-06-18 HISTORY — PX: INTRAVASCULAR PRESSURE WIRE/FFR STUDY: CATH118243

## 2021-06-18 HISTORY — PX: RIGHT/LEFT HEART CATH AND CORONARY ANGIOGRAPHY: CATH118266

## 2021-06-18 HISTORY — PX: CORONARY PRESSURE/FFR STUDY: CATH118243

## 2021-06-18 LAB — POCT I-STAT EG7
Acid-base deficit: 2 mmol/L (ref 0.0–2.0)
Bicarbonate: 22.8 mmol/L (ref 20.0–28.0)
Calcium, Ion: 1.03 mmol/L — ABNORMAL LOW (ref 1.15–1.40)
HCT: 33 % — ABNORMAL LOW (ref 39.0–52.0)
Hemoglobin: 11.2 g/dL — ABNORMAL LOW (ref 13.0–17.0)
O2 Saturation: 80 %
Potassium: 3.6 mmol/L (ref 3.5–5.1)
Sodium: 145 mmol/L (ref 135–145)
TCO2: 24 mmol/L (ref 22–32)
pCO2, Ven: 40.5 mmHg — ABNORMAL LOW (ref 44.0–60.0)
pH, Ven: 7.359 (ref 7.250–7.430)
pO2, Ven: 46 mmHg — ABNORMAL HIGH (ref 32.0–45.0)

## 2021-06-18 LAB — POCT I-STAT 7, (LYTES, BLD GAS, ICA,H+H)
Acid-Base Excess: 0 mmol/L (ref 0.0–2.0)
Bicarbonate: 24.1 mmol/L (ref 20.0–28.0)
Calcium, Ion: 1.27 mmol/L (ref 1.15–1.40)
HCT: 36 % — ABNORMAL LOW (ref 39.0–52.0)
Hemoglobin: 12.2 g/dL — ABNORMAL LOW (ref 13.0–17.0)
O2 Saturation: 99 %
Potassium: 4.3 mmol/L (ref 3.5–5.1)
Sodium: 140 mmol/L (ref 135–145)
TCO2: 25 mmol/L (ref 22–32)
pCO2 arterial: 38.6 mmHg (ref 32.0–48.0)
pH, Arterial: 7.404 (ref 7.350–7.450)
pO2, Arterial: 134 mmHg — ABNORMAL HIGH (ref 83.0–108.0)

## 2021-06-18 LAB — POCT ACTIVATED CLOTTING TIME: Activated Clotting Time: 248 seconds

## 2021-06-18 SURGERY — RIGHT/LEFT HEART CATH AND CORONARY ANGIOGRAPHY
Anesthesia: LOCAL

## 2021-06-18 MED ORDER — VERAPAMIL HCL 2.5 MG/ML IV SOLN
INTRAVENOUS | Status: AC
  Filled 2021-06-18: qty 2

## 2021-06-18 MED ORDER — HYDRALAZINE HCL 20 MG/ML IJ SOLN: 10.0000 mg | INTRAMUSCULAR | Status: DC | PRN

## 2021-06-18 MED ORDER — ONDANSETRON HCL 4 MG/2ML IJ SOLN: 4.0000 mg | Freq: Four times a day (QID) | INTRAMUSCULAR | Status: DC | PRN

## 2021-06-18 MED ORDER — SODIUM CHLORIDE 0.9% FLUSH: 3.0000 mL | INTRAVENOUS | Status: DC | PRN

## 2021-06-18 MED ORDER — HEPARIN (PORCINE) IN NACL 1000-0.9 UT/500ML-% IV SOLN
INTRAVENOUS | Status: DC | PRN
  Administered 2021-06-18 (×2): 500 mL

## 2021-06-18 MED ORDER — LIDOCAINE HCL (PF) 1 % IJ SOLN
INTRAMUSCULAR | Status: DC | PRN
  Administered 2021-06-18 (×2): 2 mL

## 2021-06-18 MED ORDER — SODIUM CHLORIDE 0.9 % IV SOLN: 250.0000 mL | INTRAVENOUS | Status: DC | PRN

## 2021-06-18 MED ORDER — SODIUM CHLORIDE 0.9 % IV SOLN: INTRAVENOUS | Status: DC

## 2021-06-18 MED ORDER — HEPARIN SODIUM (PORCINE) 1000 UNIT/ML IJ SOLN
INTRAMUSCULAR | Status: AC
  Filled 2021-06-18: qty 1

## 2021-06-18 MED ORDER — MIDAZOLAM HCL 2 MG/2ML IJ SOLN
INTRAMUSCULAR | Status: DC | PRN
  Administered 2021-06-18: 2 mg via INTRAVENOUS

## 2021-06-18 MED ORDER — ASPIRIN 81 MG PO CHEW: 81.0000 mg | CHEWABLE_TABLET | Freq: Every day | ORAL | Status: DC

## 2021-06-18 MED ORDER — SODIUM CHLORIDE 0.9% FLUSH: 3.0000 mL | Freq: Two times a day (BID) | INTRAVENOUS | Status: DC

## 2021-06-18 MED ORDER — ASPIRIN 81 MG PO CHEW: 81.0000 mg | CHEWABLE_TABLET | ORAL | Status: DC

## 2021-06-18 MED ORDER — HEPARIN SODIUM (PORCINE) 1000 UNIT/ML IJ SOLN
INTRAMUSCULAR | Status: DC | PRN
  Administered 2021-06-18: 6000 [IU] via INTRAVENOUS
  Administered 2021-06-18: 5000 [IU] via INTRAVENOUS

## 2021-06-18 MED ORDER — FENTANYL CITRATE (PF) 100 MCG/2ML IJ SOLN
INTRAMUSCULAR | Status: AC
  Filled 2021-06-18: qty 2

## 2021-06-18 MED ORDER — LIDOCAINE HCL (PF) 1 % IJ SOLN
INTRAMUSCULAR | Status: AC
  Filled 2021-06-18: qty 30

## 2021-06-18 MED ORDER — FENTANYL CITRATE (PF) 100 MCG/2ML IJ SOLN
INTRAMUSCULAR | Status: DC | PRN
  Administered 2021-06-18: 50 ug via INTRAVENOUS

## 2021-06-18 MED ORDER — LABETALOL HCL 5 MG/ML IV SOLN: 10.0000 mg | INTRAVENOUS | Status: DC | PRN

## 2021-06-18 MED ORDER — ACETAMINOPHEN 325 MG PO TABS: 650.0000 mg | ORAL_TABLET | ORAL | Status: DC | PRN

## 2021-06-18 MED ORDER — MIDAZOLAM HCL 2 MG/2ML IJ SOLN
INTRAMUSCULAR | Status: AC
  Filled 2021-06-18: qty 2

## 2021-06-18 MED ORDER — HEPARIN (PORCINE) IN NACL 1000-0.9 UT/500ML-% IV SOLN
INTRAVENOUS | Status: AC
  Filled 2021-06-18: qty 1000

## 2021-06-18 MED ORDER — VERAPAMIL HCL 2.5 MG/ML IV SOLN
INTRAVENOUS | Status: DC | PRN
  Administered 2021-06-18: 10 mL via INTRA_ARTERIAL

## 2021-06-18 MED ORDER — IOHEXOL 350 MG/ML SOLN
INTRAVENOUS | Status: DC | PRN
  Administered 2021-06-18: 110 mL

## 2021-06-18 MED ORDER — SODIUM CHLORIDE 0.9 % IV SOLN: INTRAVENOUS | Status: AC

## 2021-06-18 SURGICAL SUPPLY — 14 items
CATH 5FR JL3.5 JR4 ANG PIG MP (CATHETERS) ×2 IMPLANT
CATH BALLN WEDGE 5F 110CM (CATHETERS) ×2 IMPLANT
CATH VISTA GUIDE 6FR XBLAD3.5 (CATHETERS) ×2 IMPLANT
DEVICE RAD COMP TR BAND LRG (VASCULAR PRODUCTS) ×2 IMPLANT
GLIDESHEATH SLEND SS 6F .021 (SHEATH) ×2 IMPLANT
GUIDEWIRE PRESSURE X 175 (WIRE) ×2 IMPLANT
KIT ESSENTIALS PG (KITS) ×2 IMPLANT
KIT HEART LEFT (KITS) ×2 IMPLANT
PACK CARDIAC CATHETERIZATION (CUSTOM PROCEDURE TRAY) ×2 IMPLANT
SHEATH GLIDE SLENDER 4/5FR (SHEATH) ×2 IMPLANT
TRANSDUCER W/STOPCOCK (MISCELLANEOUS) ×2 IMPLANT
TUBING CIL FLEX 10 FLL-RA (TUBING) ×2 IMPLANT
WIRE EMERALD 3MM-J .035X150CM (WIRE) ×2 IMPLANT
WIRE HI TORQ VERSACORE J 260CM (WIRE) ×2 IMPLANT

## 2021-06-18 NOTE — Interval H&P Note (Signed)
History and Physical Interval Note:  06/18/2021 7:31 AM  [name]  has presented today for surgery, with the diagnosis of shortness of breath.  The various methods of treatment have been discussed with the patient and family. After consideration of risks, benefits and other options for treatment, the patient has consented to  Procedure(s): RIGHT/LEFT HEART CATH AND CORONARY ANGIOGRAPHY (N/A) as a surgical intervention.  The patient's history has been reviewed, patient examined, no change in status, stable for surgery.  I have reviewed the patient's chart and labs.  Questions were answered to the patient's satisfaction.    Cath Lab Visit (complete for each Cath Lab visit)  Clinical Evaluation Leading to the Procedure:   ACS: No.  Non-ACS:    Anginal Classification: CCS III  Anti-ischemic medical therapy: Maximal Therapy (2 or more classes of medications)  Non-Invasive Test Results: Coronary CTA with possible severe LAD stenosis  Prior CABG: No previous CABG        [name]

## 2021-06-18 NOTE — Research (Signed)
Inclusion: [x]  1. Age >77 [x]  2. Clinically stable patient with known CAD. [x]   3. CCTA showing stenosis in at least one major epicardial vessel of stentable/graftable diameter, in whom clinically indicated IVUS is planned within 45 days of the CCTA  and FFR CT available. [x]  4. FFR CT successfully processed. [x]  5. Willing to comply with protocol. [x]  6. Agrees to be included in the study and able to provide written informed consent.  Exclusion: []  1. CCTA showing no stenosis. []  2. Uninterpretable CCTA by HeartFlow assessment, in which image quality prevents FFR CT from being processed. []  3. Acute chest pain. []  4. CABG prior to CCTA acquisition. []  5. Prior history of PCI for 3 or more vessels. []  6. MI less than 30 days prior to CCTA or between CCTA and ICA. []  7. Suspicion of acute coronary syndrome, Acute MI or Unstable Angina. []  8. Known complex congenital heart disease. []  9. Tachycardia or significant arrythmia. []  10. Subject requires an emergent procedure. []  11. Evidence of ongoing or active clinical instability, including acute chest pain  (sudden onset), cardiogenic shock, unstable blood pressure with systolic blood pressure <66 mmHg, and severe congestive heart failure (NYHA lll or lV) or acute  pulmonary edema.  []  12. Any active, serious, life-threatening disease with a life expectancy of less than 2 months. []  13. Currently enrolled in another study utilizing FFR CT or in an investigational trial that involves a non-approved cardiac drug or device.  []  14. Persons under the protection of justice, guardianship, or curatorship.     Reason for CCTA scan: []  Chest Pain []  Asymptomatic/screening     []  Prior MI     [x]  Dyspnea   [x]  Palpitations []  Abnormal ECG    []    Abnormal Stress or Perfusion   []  Other Which CT Scanner was used?          []  Philips Model: []  CT 5000 Ingenuity    []  Spectral CT 7500   []  CT6000 iCT    []  IQon         []   Incisive CT    []    Other []  Siemens Model: []  Somatom Definition Edge    []  Somatom Definition Flash            []  Somatom Force  []  Somatom Drive   []   Other []  Product/process development scientist:  []  Revolution CT  []  CardioGraph   []  Optima CT660  []   Discovery HD 750   []  Other Radiation Exposure for CCTA Only (Sum for All Coronary Series)  CT dose index (CTDI) (mGy)  _____________________________  Rhea Pink product (DLP) (mGy-cm) ______________________   []   CCTA Image Uploaded   []  CCTA report uploaded  []  All subject information redacted from image and report? Subject Demographics: Age: ____75_____    Year of birth:  _1947_______ Birth Sex: [x]  Male   []   Male  Weight: ___116.1__ [] lb  [x]  kg   []  Not done Height: ___180.3_____ []  in  [x]  cm   []  Not done  Ethnicity: [x]  Not of Hispanic, Latino/a, or Spanish origin []  Hispanic, Latino/a, or Spanish origin Specify: []   [place], [place] American, Chicano/a      []  [place]  []  [place] [] other Hispanic, Latino/a, or Spanish origin Race:  [x]  White  []  Black  []  American [place] or [place] Native  []  Asian:  Specify: []  Asian [place]   []  [place]  []  Chinese  []  Vietnamese   []  Filipino  []   Other Asian  []  [place]   []  Native Hawaiian or other [place]              []  Native Hawaiian   []  Guamanian or Chamorro  []  Samoan   []  Other Pacific Islander   []  Other (specify) Medical History: Angina within the last 45 days:   []  Yes   [x]  No If yes: []  Typical []  Atypical  []  Dyspnea  []  Noncardiac pain Angina type: []  Stable []  Unstable []  Silent Ischemia  []  Unknown []  Other Congestive Heart Failure: []  Yes [x]  No Diabetes: []  Yes  [x]  No If yes:  []  Type l   []  Type ll Controlled by:  []  Insulin   []  Oral hypoglycemic  []  Diet  []   unknown Hypertension:  [x]  Yes  []   No  (Systolic >814, diastolic >48 or req. medication) Is subject taking anti-hypertensive medication?   [x]  Yes  []   No Hyperlipidemia:   [x]  Yes  []  No Is subject taking  anti-hyperlipidemic medication?    [x]  Yes  []  No Tobacco Use:  []  Former  []  Current  [x]   Non-smoker  []   Unknown Any nicotine use in 24 hours prior to CCTA?   []  Yes  [x]   No Family history of premature atherosclerotic disease?   [x]  Yes  []   No (Coronary artery disease, cerebrovascular disease, or peripheral vascular disease for male 2o relatives < 77 and/or male 2o relatives < 34 years old) Stroke:  []  Yes  [x]  No Transient Ischemic Attack (TIA):   []  Yes  [x]  No Peripheral Vascular Disease:  []  Yes  [x]  No Sedentary Lifestyle:    []  Yes   [x]  No Other Relevant Disease or Comorbidity:    []  Yes  []  No Specify: ___________________   PCI History: Does the subject have previously stented vessels?   []  Yes  [x]  No ______________ Total number of PCI procedures ______________ Date of most recent PCI ______________ Total number of stented vessels NOTE: CASS Segment Diagram is provided for reference in Appendix A. PCI History Records: (complete a record for each stent) Stent 1:           Vessel: []  Right coronary artery  []  Left main artery  []  Left circumflex artery []  Left anterior descending artery   []  Other________________ CASS Segment for starting location of stent: ______________________________ CASS Segment for ending location of stent: _______________________________ Stent manufacturer: _________________________________________________ Diameter of stent (mm): _____________   Length of stent (mm): _____________ Stent 2:             Vessel: []  Right coronary artery  []  Left main artery  []  Left circumflex artery []  Left anterior descending artery   []  Other________________ CASS Segment for starting location of stent: ______________________________ CASS Segment for ending location of stent: _______________________________ Stent manufacturer: _________________________________________________ Diameter of stent (mm): _____________   Length of stent (mm): _____________ Coronary  Tests: Any coronary tests within 90 days prior to enrollment?  []  Yes  []  No Invasive Coronary Angiography (ICA):    []  Yes []  No Most recent test date: _________________   Result: []  Positive  []  Negative  []  Intermediate  []  Unknown []  Image uploaded       Report: []  Uploaded  []  N/A []  All subject information redacted from images and reports? Exercise Tolerance Test (ETT):  [] Yes  []  No     Most recent test date: Click or tap to enter a date. Result:   []   Positive    []  Negative  []  Intermediate  []   Unknown []  Image uploaded    Report:  []  Uploaded  []  N/A  []   All subject information redacted from images and reports? Stress Echocardiogram: []  Yes  []  No   Most recent date:Click or tap to enter a date. Result:  []  Positive  []  Negative  []  Intermediate  []  Unknown [] Image uploaded    Report:  []  Uploaded  []  N/A      []  All images redacted? Nuclear Myocardial Perfusion Scan:    []  Yes  []  No Most recent test date: Click or tap to enter a date.  Result:   []  Positive    []  Negative  []  Intermediate  []   Unknown [] Image uploaded    Report:  []  Uploaded  []  N/A      []  All images redacted? Cardiac MRI:   []  Yes  []  No Most recent test date: Click or tap to enter a date.  Result:   []  Positive    []  Negative  []  Intermediate  []   Unknown [] Image uploaded    Report:  []  Uploaded  []  N/A      []  All images redacted? Cardiac PET:    []  Yes  []  No Most recent test date: Click or tap to enter a date.  Result:   []  Positive    []  Negative  []  Intermediate  []   Unknown [] Image uploaded    Report:  []  Uploaded  []  N/A      []  All images redacted?  Baseline Cath Procedure: Date of procedure: Click or tap to enter a date. Blood pressure prior to procedure:   Heart Rate prior to procedure: Time of arterial access: Time of first lesion treatment: Time last guide catheter removed: _______________ Aortic pressure (mean mmHg): __________________ LVED pressure (mmHg):  ________________________ Arterial access site:   []  Radial  []  Femoral  []  Brachial Maximum arterial sheath size (Fr) _______________ Venous access needle size:  []  18 G  []  20 G  []  4 FR  []  5 FR  []  6 FR  []  Other Left ventriculography performed?  []  Yes  []  No If yes, when:   []  Pre-intervention  []  Post- intervention LVEF (%):_______________ Were there any procedural complications?          []  Yes  []  No []  Dissection from wire  []  Dissection from catheter  []  Slow / no flow []  Allergic reaction to medication  []  Side branch occlusion  []  Other  Was the FFRCT model available & used in cath lab during the procedure? []  Y  []  N []  Angio image uploaded  []  Cath report uploaded  []  All subject information redacted from images and reports?  Intraprocedural Medications: Was adenosine administered within 24 hrs of the cath procedure?  []  Yes  []  No IV Adenosine start time: _______________ Adenosine dose:  []  140 ug/kg/min  []  Other dose  []  None Was nitroglycerin administered within 24 hrs of the cath procedure? []  Yes []  No NTG dose: _____________ NTG Route: []  IV  []  IC  []  Other  Intraprocedural Devices: (total number of each device used during the procedure): _________ Pressure wires & FFR wires _________ Guidewires   (Do not include wires used before intervention and pressure/FFR wires) _________ Balloon catheters _________ Drug eluting stents _________ Bare metal stents _________ Guide catheters _________ Intravascular ultrasound catheters _________ Intravascular ultrasound OCT catheters _________ Thrombectomy and atherectomy catheters _________ Temporary  pacemakers _________ Intra-aortic balloon pumps (IABP) Intraprocedural Radiation exposure: Total fluoroscopy time _________________ (minutes) Absorbed Dose of Radiation _____________   []  mGy  []  Gy Dose Area Product: ____________________  []  Gy*cm2  []  cGy*cm2  []  mGy*cm2  Other: ______________ Contrast Use: (check all  that apply) []  Ominipaque  []  Visipaque  []   Isovue  []  Iomeron  []  Ultravist   [] Oxilan []  Hypaque  []  Isopaque  []  Hexibrix  []  OptiRay  []  Iopamidol  Total amount of contrast used (mL): ______________  IVUS/OCT: Model: []  Philips Volcano                    Catheter type:    []  Revolution  []  Eagle Eye Platinum  []  Eagle Eye  P     Platinum ST          []  Refinity ST               []  Boston Scientific                   Catheter type:  []  Opticross   []  Opticross HD  []  Other                []  Terumo                    Catheter type:   []  ViewIT    []  Intrafocus WR  Ivus Image uploaded:  []  Yes  []  No OCT taken?   []  Yes   []  No   []  All subject information redacted from images and reports?  IVUS/OCT Records Was automatic pullback used for IVUS/OCT acquisition?   []  Yes  []  No       Pullback speed (mm/sec): _____________________        Length of pullback (mm): _____________________ Vessel 1: Image type:   []  IVUS   []  OCT  []  Right coronary artery  []  Left main artery  []  Left circumflex artery []  Left anterior descending artery   []  Other________________ CASS Segment for starting location of guidewire: __________________________ CASS Segment for ending location of guidewire: ________________________ Guide catheter size:  [] 4 FR  [] 5 FR  [] 6 FR  [] 7 FR  [] 8 FR Vessel 2: Image type:   []  IVUS   []  OCT  []  Right coronary artery  []  Left main artery  []  Left circumflex artery []  Left anterior descending artery   []  Other________________ CASS Segment for starting location of guidewire: __________________________ CASS Segment for ending location of guidewire: ________________________ Guide catheter size:  [] 4 FR  [] 5 FR  [] 6 FR  [] 7 FR  [] 8 FR Vessel 3: Image type:   []  IVUS   []  OCT  []  Right coronary artery  []  Left main artery  []  Left circumflex artery []  Left anterior descending artery   []  Other________________ CASS Segment for starting location of guidewire:  __________________________ CASS Segment for ending location of guidewire: ________________________ Guide catheter size:  [] 4 FR  [] 5 FR  [] 6 FR  [] 7 FR  [] 8 FR FFR/NHPR Records Measurement taken during cath procedure: []  FFR  []  DFR  []  IFR  []  RFR   [] Other Vessel 1: []  Right coronary artery  []  Left main artery  []  Left circumflex artery []  Left anterior descending artery   []  Other________________ Contrast used?     []  Yes   []  No CASS Segment for starting location of guide wire: __________________________ CASS Segment for ending location of  guide wire: ___________________________ Guide catheter size:  [] 4 FR  [] 5 FR  [] 6 FR  [] 7 FR  [] 8 FR FFR Value: _____________ Pd value: ______________ Pa value: ______________ NHPR value: ____________ Vessel 2: []  Right coronary artery  []  Left main artery  []  Left circumflex artery []  Left anterior descending artery   []  Other________________ Contrast used?     []  Yes   []  No CASS Segment for starting location of guide wire: __________________________ CASS Segment for ending location of guide wire: ___________________________ Guide catheter size:  [] 4 FR  [] 5 FR  [] 6 FR  [] 7 FR  [] 8 FR FFR Value: _____________ Pd value: ______________ Pa value: ______________ NHPR value: ____________  Vessel 3: []  Right coronary artery  []  Left main artery  []  Left circumflex artery []  Left anterior descending artery   []  Other________________ Contrast used?     []  Yes   []  No CASS Segment for starting location of guide wire: __________________________ CASS Segment for ending location of guide wire: ___________________________ Guide catheter size:  [] 4 FR  [] 5 FR  [] 6 FR  [] 7 FR  [] 8 FR FFR Value: _____________ Pd value: ______________ Pa value: ______________ NHPR value: ____________ FFR Uploads []  Angio Image  []  FFR Report  []  FFR Tracings  []  Screenshot of Angio - starting position of FFR guidewire without contrast []  Screenshot of Angio -  starting position of FFR guidewire with contrast  NHPR Uploads []  Angio Image  []  NHPR Report  []  NHPR Tracings  []  Screenshot of Angio - starting position of NHPR guidewire without contrast []  Screenshot of Angio - starting position of NHPR guidewire with contrast []  All subject information redacted from images and reports?  Study completion:  Did subject complete participation in the study?    []  Yes  [x]  No If no, reason:                         []  Death []  Withdrew consent  []  Other  []  Screen Failure Screen Failure reason:  [x]  Ivus not used  []  Use of balloon prior  []  Automatic pullback not used

## 2021-06-18 NOTE — Progress Notes (Signed)
Pt ambulated without difficulty or bleeding.   Discharged home with his wife who will drive and stay with pt x 24 hrs. 

## 2021-06-18 NOTE — Research (Signed)
REVEAL PLAQUE Informed Consent   Subject Name: [name]  Subject met inclusion and exclusion criteria.  The informed consent form, study requirements and expectations were reviewed with the subject and questions and concerns were addressed prior to the signing of the consent form.  The subject verbalized understanding of the trail requirements.  The subject agreed to participate in the [place] trial and signed the informed consent.  The informed consent was obtained prior to performance of any protocol-specific procedures for the subject.  A copy of the signed informed consent was given to the subject and a copy was placed in the subject's medical record.  [name]. 06/18/2021, 0930 am

## 2021-06-18 NOTE — Discharge Instructions (Signed)
Radial Site Care  This sheet gives you information about how to care for yourself after your procedure. Your health care provider may also give you more specific instructions. If you have problems or questions, contact your health care provider. What can I expect after the procedure? After the procedure, it is common to have: Bruising and tenderness at the catheter insertion area. Follow these instructions at home: Medicines Take over-the-counter and prescription medicines only as told by your health care provider. Insertion site care Follow instructions from your health care provider about how to take care of your insertion site. Make sure you: Wash your hands with soap and water before you remove your bandage (dressing). If soap and water are not available, use hand sanitizer. May remove dressing in 24 hours. Check your insertion site every day for signs of infection. Check for: Redness, swelling, or pain. Fluid or blood. Pus or a bad smell. Warmth. Do no take baths, swim, or use a hot tub for 5 days. You may shower 24-48 hours after the procedure. Remove the dressing and gently wash the site with plain soap and water. Pat the area dry with a clean towel. Do not rub the site. That could cause bleeding. Do not apply powder or lotion to the site. Activity  For 24 hours after the procedure, or as directed by your health care provider: Do not flex or bend the affected arm. Do not push or pull heavy objects with the affected arm. Do not drive yourself home from the hospital or clinic. You may drive 24 hours after the procedure. Do not operate machinery or power tools. KEEP ARM ELEVATED THE REMAINDER OF THE DAY. Do not push, pull or lift anything that is heavier than 10 lb for 5 days. Ask your health care provider when it is okay to: Return to work or school. Resume usual physical activities or sports. Resume sexual activity. General instructions If the catheter site starts to  bleed, raise your arm and put firm pressure on the site. If the bleeding does not stop, get help right away. This is a medical emergency. DRINK PLENTY OF FLUIDS FOR THE NEXT 2-3 DAYS. No alcohol consumption for 24 hours after receiving sedation. If you went home on the same day as your procedure, a responsible adult should be with you for the first 24 hours after you arrive home. Keep all follow-up visits as told by your health care provider. This is important. Contact a health care provider if: You have a fever. You have redness, swelling, or yellow drainage around your insertion site. Get help right away if: You have unusual pain at the radial site. The catheter insertion area swells very fast. The insertion area is bleeding, and the bleeding does not stop when you hold steady pressure on the area. Your arm or hand becomes pale, cool, tingly, or numb. These symptoms may represent a serious problem that is an emergency. Do not wait to see if the symptoms will go away. Get medical help right away. Call your local emergency services (911 in the [place] Do not drive yourself to the hospital. Summary After the procedure, it is common to have bruising and tenderness at the site. Follow instructions from your health care provider about how to take care of your radial site wound. Check the wound every day for signs of infection.  This information is not intended to replace advice given to you by your health care provider. Make sure you discuss any questions you have with   your health care provider. Document Revised: 01/10/2018 Document Reviewed: 01/10/2018 Elsevier Patient Education  2020 Elsevier Inc.  

## 2021-06-22 ENCOUNTER — Telehealth: Payer: Self-pay | Admitting: Internal Medicine

## 2021-06-22 ENCOUNTER — Encounter (HOSPITAL_COMMUNITY): Payer: Self-pay | Admitting: Cardiovascular Disease

## 2021-06-22 DIAGNOSIS — I2510 Atherosclerotic heart disease of native coronary artery without angina pectoris: Secondary | ICD-10-CM

## 2021-06-22 DIAGNOSIS — Z01812 Encounter for preprocedural laboratory examination: Secondary | ICD-10-CM

## 2021-06-22 NOTE — Telephone Encounter (Signed)
Pt developed red flat rash all over his body.  Splotchy, very itchy.  Used benadryl.  Started Friday - day after cath.  Improving now.  I have made him aware of plan for staged intervention and that he is on cath schedule for 6/14.  I will prepare his instructions and send prescription for Plavix  Message from Dr. Angelena Form:  Angelena Form, Annita Brod, MD  Rodman Key, RN [place]. I cathed Mr. Marcella last week. He will need PCI of the LAD but since it is heavily calcified, I am going to need to use orbital atherectomy before stenting. I have put him on the cath schedule already for Thursday June 14th at 7:30 am. Can you help me with cath instructions and also send in Plavix for him. He will need a 600 mg loading dose at home on Monday July 11th and then 75 mg daily after that. Thanks for your help. Gerald Stabs

## 2021-06-22 NOTE — Telephone Encounter (Signed)
New Message:    Pt said he had a Cath on 06-17-21. He said the next day he had a terrible red rash, it itch so much. He said today it is a little better.

## 2021-06-24 ENCOUNTER — Encounter: Payer: Self-pay | Admitting: *Deleted

## 2021-06-24 MED ORDER — CLOPIDOGREL BISULFATE 75 MG PO TABS: ORAL_TABLET | ORAL | 0 refills | Status: DC

## 2021-06-24 MED ORDER — PREDNISONE 50 MG PO TABS: ORAL_TABLET | ORAL | 0 refills | Status: DC

## 2021-06-24 NOTE — Telephone Encounter (Signed)
Spoke w patient. He is aware of plan/all instructions for arthrectomy/intervention on 7/14.  He is aware to premedicate because of the rash he had after receiving contrast last week and how/when to begin Plavix.  Aware to pick up hard copy of instruction letter when he comes for labs on 06/28/21.

## 2021-06-24 NOTE — Telephone Encounter (Signed)
[name], We need to pre-treat him for possible contrast dye allergy if he had a rash after his last cath. Chris ____________________________________ Cath instructions sent to pt in [place] and letter printed for him to pick up when he comes for repeat BMET on 7/11.    I have sent Plavix prescription and premedication prednisone per Dr. Angelena Form since he developed a rash day one post cath.

## 2021-06-28 ENCOUNTER — Telehealth: Payer: Self-pay

## 2021-06-28 ENCOUNTER — Other Ambulatory Visit: Payer: Medicare Other

## 2021-06-28 ENCOUNTER — Other Ambulatory Visit: Payer: Self-pay

## 2021-06-28 DIAGNOSIS — I2510 Atherosclerotic heart disease of native coronary artery without angina pectoris: Secondary | ICD-10-CM | POA: Diagnosis not present

## 2021-06-28 DIAGNOSIS — Z01812 Encounter for preprocedural laboratory examination: Secondary | ICD-10-CM | POA: Diagnosis not present

## 2021-06-28 LAB — CBC
Hematocrit: 39.8 % (ref 37.5–51.0)
Hemoglobin: 13.3 g/dL (ref 13.0–17.7)
MCH: 31.8 pg (ref 26.6–33.0)
MCHC: 33.4 g/dL (ref 31.5–35.7)
MCV: 95 fL (ref 79–97)
Platelets: 186 10*3/uL (ref 150–450)
RBC: 4.18 x10E6/uL (ref 4.14–5.80)
RDW: 14.5 % (ref 11.6–15.4)
WBC: 8.1 10*3/uL (ref 3.4–10.8)

## 2021-06-28 LAB — BASIC METABOLIC PANEL
BUN/Creatinine Ratio: 23 (ref 10–24)
BUN: 27 mg/dL (ref 8–27)
CO2: 25 mmol/L (ref 20–29)
Calcium: 10.3 mg/dL — ABNORMAL HIGH (ref 8.6–10.2)
Chloride: 102 mmol/L (ref 96–106)
Creatinine, Ser: 1.16 mg/dL (ref 0.76–1.27)
Glucose: 106 mg/dL — ABNORMAL HIGH (ref 65–99)
Potassium: 5 mmol/L (ref 3.5–5.2)
Sodium: 138 mmol/L (ref 134–144)
eGFR: 65 mL/min/{1.73_m2} (ref >59)

## 2021-06-28 NOTE — Telephone Encounter (Signed)
**Note De-Identified Hunt Zajicek Obfuscation** Clopidogrel PA started through covermymeds. Key: [id number]

## 2021-06-29 ENCOUNTER — Telehealth: Payer: Self-pay | Admitting: *Deleted

## 2021-06-29 ENCOUNTER — Telehealth: Payer: Self-pay | Admitting: Cardiovascular Disease

## 2021-06-29 NOTE — Telephone Encounter (Signed)
Pt contacted pre-catheterization scheduled at [facility] for: Thursday July 01, 2021 7:30 AM Verified arrival time and place: [facility]) at: 5:30 AM   No solid food after midnight prior to cath, clear liquids until 5 AM day of procedure.  CONTRAST ALLERGY: yes-13 hour Prednisone and Benadryl Prep reviewed with patient 06/30/21 Prednisone 50 mg 6:30 PM 07/01/21 Prednisone 50 mg 12:30 AM 07/01/21 Prednisone 50 mg and Benadryl 50 mg-just prior to leaving home for hospital AM of procedure Pt advised not to drive.  Hold: HCTZ-AM of procedure  Except hold medications AM meds can be  taken pre-cath with sips of water including: aspirin 81 mg Plavix 75 mg  Confirmed patient has responsible adult to drive home post procedure and be with patient first 24 hours after arriving home: yes  You are allowed ONE visitor in the waiting room during the time you are at the hospital for your procedure. Both you and your visitor must wear a mask once you enter the hospital.   Patient reports does not currently have any symptoms concerning for COVID-19 and no household members with COVID-19 like illness.       Reviewed procedure/mask/visitor instructions with patient.

## 2021-07-01 ENCOUNTER — Ambulatory Visit (HOSPITAL_COMMUNITY)
Admission: RE | Admit: 2021-07-01 | Discharge: 2021-07-01 | Disposition: A | Discharge status: Home / Self Care | Payer: Medicare Other | Attending: Cardiovascular Disease | Admitting: Cardiovascular Disease

## 2021-07-01 ENCOUNTER — Encounter (HOSPITAL_COMMUNITY)
Admission: RE | Disposition: A | Discharge status: Home / Self Care | Payer: Self-pay | Source: Home / Self Care | Attending: Cardiovascular Disease

## 2021-07-01 ENCOUNTER — Other Ambulatory Visit: Payer: Self-pay

## 2021-07-01 DIAGNOSIS — Z881 Allergy status to other antibiotic agents status: Secondary | ICD-10-CM | POA: Diagnosis not present

## 2021-07-01 DIAGNOSIS — Z7902 Long term (current) use of antithrombotics/antiplatelets: Secondary | ICD-10-CM | POA: Diagnosis not present

## 2021-07-01 DIAGNOSIS — E785 Hyperlipidemia, unspecified: Secondary | ICD-10-CM | POA: Insufficient documentation

## 2021-07-01 DIAGNOSIS — I480 Paroxysmal atrial fibrillation: Secondary | ICD-10-CM | POA: Diagnosis not present

## 2021-07-01 DIAGNOSIS — Z91041 Radiographic dye allergy status: Secondary | ICD-10-CM | POA: Diagnosis not present

## 2021-07-01 DIAGNOSIS — Z79899 Other long term (current) drug therapy: Secondary | ICD-10-CM | POA: Diagnosis not present

## 2021-07-01 DIAGNOSIS — Z95820 Peripheral vascular angioplasty status with implants and grafts: Secondary | ICD-10-CM

## 2021-07-01 DIAGNOSIS — Z7982 Long term (current) use of aspirin: Secondary | ICD-10-CM | POA: Insufficient documentation

## 2021-07-01 DIAGNOSIS — Z888 Allergy status to other drugs, medicaments and biological substances status: Secondary | ICD-10-CM | POA: Diagnosis not present

## 2021-07-01 DIAGNOSIS — Z955 Presence of coronary angioplasty implant and graft: Secondary | ICD-10-CM | POA: Diagnosis not present

## 2021-07-01 DIAGNOSIS — I10 Essential (primary) hypertension: Secondary | ICD-10-CM | POA: Insufficient documentation

## 2021-07-01 DIAGNOSIS — I25110 Atherosclerotic heart disease of native coronary artery with unstable angina pectoris: Secondary | ICD-10-CM | POA: Diagnosis not present

## 2021-07-01 DIAGNOSIS — Z8249 Family history of ischemic heart disease and other diseases of the circulatory system: Secondary | ICD-10-CM | POA: Diagnosis not present

## 2021-07-01 HISTORY — PX: INTRAVASCULAR ULTRASOUND/IVUS: CATH118244

## 2021-07-01 HISTORY — PX: CORONARY STENT INTERVENTION: CATH118234

## 2021-07-01 HISTORY — PX: CORONARY ATHERECTOMY: CATH118238

## 2021-07-01 HISTORY — PX: CORONARY ULTRASOUND/IVUS: CATH118244

## 2021-07-01 LAB — POCT ACTIVATED CLOTTING TIME
Activated Clotting Time: 294 seconds
Activated Clotting Time: 266 seconds
Activated Clotting Time: 295 seconds

## 2021-07-01 SURGERY — CORONARY ATHERECTOMY
Anesthesia: LOCAL

## 2021-07-01 MED ORDER — VIPERSLIDE LUBRICANT OPTIME
TOPICAL | Status: DC | PRN
  Administered 2021-07-01: 20 mL via SURGICAL_CAVITY

## 2021-07-01 MED ORDER — SODIUM CHLORIDE 0.9% FLUSH: 3.0000 mL | INTRAVENOUS | Status: DC | PRN

## 2021-07-01 MED ORDER — HEPARIN SODIUM (PORCINE) 1000 UNIT/ML IJ SOLN
INTRAMUSCULAR | Status: AC
  Filled 2021-07-01: qty 1

## 2021-07-01 MED ORDER — ACETAMINOPHEN 325 MG PO TABS: 650.0000 mg | ORAL_TABLET | ORAL | Status: DC | PRN

## 2021-07-01 MED ORDER — HEPARIN (PORCINE) IN NACL 1000-0.9 UT/500ML-% IV SOLN
INTRAVENOUS | Status: DC | PRN
  Administered 2021-07-01 (×2): 500 mL

## 2021-07-01 MED ORDER — HEPARIN SODIUM (PORCINE) 1000 UNIT/ML IJ SOLN
INTRAMUSCULAR | Status: DC | PRN
  Administered 2021-07-01: 3000 [IU] via INTRAVENOUS
  Administered 2021-07-01: 12000 [IU] via INTRAVENOUS
  Administered 2021-07-01: 4000 [IU] via INTRAVENOUS

## 2021-07-01 MED ORDER — LIDOCAINE HCL (PF) 1 % IJ SOLN
INTRAMUSCULAR | Status: AC
  Filled 2021-07-01: qty 30

## 2021-07-01 MED ORDER — VERAPAMIL HCL 2.5 MG/ML IV SOLN
INTRAVENOUS | Status: AC
  Filled 2021-07-01: qty 2

## 2021-07-01 MED ORDER — ASPIRIN 81 MG PO CHEW: 81.0000 mg | CHEWABLE_TABLET | ORAL | Status: AC

## 2021-07-01 MED ORDER — SODIUM CHLORIDE 0.9 % IV SOLN: INTRAVENOUS | Status: AC

## 2021-07-01 MED ORDER — MIDAZOLAM HCL 2 MG/2ML IJ SOLN
INTRAMUSCULAR | Status: AC
  Filled 2021-07-01: qty 2

## 2021-07-01 MED ORDER — VERAPAMIL HCL 2.5 MG/ML IV SOLN
INTRAVENOUS | Status: DC | PRN
  Administered 2021-07-01: 10 mL via INTRA_ARTERIAL

## 2021-07-01 MED ORDER — FENTANYL CITRATE (PF) 100 MCG/2ML IJ SOLN
INTRAMUSCULAR | Status: DC | PRN
  Administered 2021-07-01: 50 ug via INTRAVENOUS
  Administered 2021-07-01 (×2): 25 ug via INTRAVENOUS

## 2021-07-01 MED ORDER — ONDANSETRON HCL 4 MG/2ML IJ SOLN: 4.0000 mg | Freq: Four times a day (QID) | INTRAMUSCULAR | Status: DC | PRN

## 2021-07-01 MED ORDER — SODIUM CHLORIDE 0.9 % WEIGHT BASED INFUSION: 1.0000 mL/kg/h | INTRAVENOUS | Status: DC

## 2021-07-01 MED ORDER — SODIUM CHLORIDE 0.9 % IV SOLN: 250.0000 mL | INTRAVENOUS | Status: DC | PRN

## 2021-07-01 MED ORDER — ROSUVASTATIN CALCIUM 20 MG PO TABS: 20.0000 mg | ORAL_TABLET | Freq: Every day | ORAL | 1 refills | Status: DC

## 2021-07-01 MED ORDER — LIDOCAINE HCL (PF) 1 % IJ SOLN
INTRAMUSCULAR | Status: DC | PRN
  Administered 2021-07-01: 2 mL via INTRADERMAL

## 2021-07-01 MED ORDER — NITROGLYCERIN 1 MG/10 ML FOR IR/CATH LAB
INTRA_ARTERIAL | Status: DC | PRN
  Administered 2021-07-01 (×2): 200 ug via INTRACORONARY

## 2021-07-01 MED ORDER — IOHEXOL 350 MG/ML SOLN
INTRAVENOUS | Status: DC | PRN
  Administered 2021-07-01: 145 mL via INTRA_ARTERIAL

## 2021-07-01 MED ORDER — SODIUM CHLORIDE 0.9 % WEIGHT BASED INFUSION
3.0000 mL/kg/h | INTRAVENOUS | Status: AC
  Administered 2021-07-01: 3 mL/kg/h via INTRAVENOUS

## 2021-07-01 MED ORDER — NITROGLYCERIN 0.4 MG SL SUBL: 0.4000 mg | SUBLINGUAL_TABLET | SUBLINGUAL | 2 refills | Status: DC | PRN

## 2021-07-01 MED ORDER — SODIUM CHLORIDE 0.9% FLUSH: 3.0000 mL | Freq: Two times a day (BID) | INTRAVENOUS | Status: DC

## 2021-07-01 MED ORDER — HEPARIN (PORCINE) IN NACL 1000-0.9 UT/500ML-% IV SOLN
INTRAVENOUS | Status: AC
  Filled 2021-07-01: qty 1000

## 2021-07-01 MED ORDER — LABETALOL HCL 5 MG/ML IV SOLN: 10.0000 mg | INTRAVENOUS | Status: AC | PRN

## 2021-07-01 MED ORDER — MIDAZOLAM HCL 2 MG/2ML IJ SOLN
INTRAMUSCULAR | Status: DC | PRN
  Administered 2021-07-01: 2 mg via INTRAVENOUS
  Administered 2021-07-01 (×2): 1 mg via INTRAVENOUS

## 2021-07-01 MED ORDER — HYDRALAZINE HCL 20 MG/ML IJ SOLN: 10.0000 mg | INTRAMUSCULAR | Status: AC | PRN

## 2021-07-01 MED ORDER — CLOPIDOGREL BISULFATE 75 MG PO TABS: 75.0000 mg | ORAL_TABLET | ORAL | Status: AC

## 2021-07-01 MED ORDER — SODIUM CHLORIDE 0.9 % IV SOLN
250.0000 mL | INTRAVENOUS | Status: DC | PRN
  Administered 2021-07-01: 250 mL via INTRAVENOUS

## 2021-07-01 MED ORDER — NITROGLYCERIN 1 MG/10 ML FOR IR/CATH LAB
INTRA_ARTERIAL | Status: AC
  Filled 2021-07-01: qty 10

## 2021-07-01 MED ORDER — FENTANYL CITRATE (PF) 100 MCG/2ML IJ SOLN
INTRAMUSCULAR | Status: AC
  Filled 2021-07-01: qty 2

## 2021-07-01 SURGICAL SUPPLY — 27 items
BALLN SAPPHIRE 2.0X15 (BALLOONS) ×2
BALLN SAPPHIRE ~~LOC~~ 2.5X12 (BALLOONS) ×2 IMPLANT
BALLN SAPPHIRE ~~LOC~~ 3.0X12 (BALLOONS) ×2 IMPLANT
BALLOON SAPPHIRE 2.0X15 (BALLOONS) ×1 IMPLANT
CATH OPTICROSS HD (CATHETERS) ×2 IMPLANT
CATH TELEPORT (CATHETERS) ×2 IMPLANT
CATH VISTA GUIDE 6FR XBLAD3.5 (CATHETERS) ×2 IMPLANT
CROWN DIAMONDBACK CLASSIC 1.25 (BURR) ×2 IMPLANT
DEVICE RAD COMP TR BAND LRG (VASCULAR PRODUCTS) ×2 IMPLANT
GLIDESHEATH SLEND SS 6F .021 (SHEATH) ×2 IMPLANT
GUIDELINER 6F (CATHETERS) ×2 IMPLANT
GUIDEWIRE INQWIRE 1.5J.035X260 (WIRE) ×1 IMPLANT
INQWIRE 1.5J .035X260CM (WIRE) ×2
KIT ENCORE 26 ADVANTAGE (KITS) ×2 IMPLANT
KIT HEART LEFT (KITS) ×2 IMPLANT
LUBRICANT VIPERSLIDE CORONARY (MISCELLANEOUS) ×2 IMPLANT
PACK CARDIAC CATHETERIZATION (CUSTOM PROCEDURE TRAY) ×2 IMPLANT
SLED PULL BACK IVUS (MISCELLANEOUS) ×2 IMPLANT
STENT ONYX FRONTIER 2.25X08 (Permanent Stent) ×2 IMPLANT
STENT ONYX FRONTIER 2.5X15 (Permanent Stent) ×2 IMPLANT
STENT ONYX FRONTIER 2.5X38 (Permanent Stent) IMPLANT
STENT ONYX FRONTIER 2.75X12 (Permanent Stent) ×2 IMPLANT
TRANSDUCER W/STOPCOCK (MISCELLANEOUS) ×2 IMPLANT
TUBING CIL FLEX 10 FLL-RA (TUBING) ×2 IMPLANT
WIRE COUGAR XT STRL 190CM (WIRE) ×2 IMPLANT
WIRE COUGAR XT STRL 300CM (WIRE) ×2 IMPLANT
WIRE VIPERWIRE COR FLEX .012 (WIRE) ×2 IMPLANT

## 2021-07-01 NOTE — Telephone Encounter (Signed)
**Note De-Identified Hena Ewalt Obfuscation** F/u on Clopidogrel PA: I called [facility] and was advised that Clopidogrel does not require a PA as the pts ins in paying their part and that the pt picked up a 90 day supply of Clopidogrel on 7/8 and paid $60.

## 2021-07-01 NOTE — Progress Notes (Signed)
Notified Dr Angelena Form that patient missed midnight dose of Prednisone, he took dose 7/13 @1830  and 7/14 @0500 .  He said that was fine.

## 2021-07-01 NOTE — Interval H&P Note (Signed)
History and Physical Interval Note:  07/01/2021 7:32 AM  [name]  has presented today for surgery, with the diagnosis of CAD.  The various methods of treatment have been discussed with the patient and family. After consideration of risks, benefits and other options for treatment, the patient has consented to  Procedure(s): CORONARY ATHERECTOMY (N/A) as a surgical intervention.  The patient's history has been reviewed, patient examined, no change in status, stable for surgery.  I have reviewed the patient's chart and labs.  Questions were answered to the patient's satisfaction.    Cath Lab Visit (complete for each Cath Lab visit)  Clinical Evaluation Leading to the Procedure:   ACS: No.  Non-ACS:    Anginal Classification: CCS III  Anti-ischemic medical therapy: Maximal Therapy (2 or more classes of medications)  Non-Invasive Test Results: No non-invasive testing performed  Prior CABG: No previous CABG        [name]

## 2021-07-01 NOTE — Progress Notes (Signed)
[id number] Education completed with pt who voiced understanding. Stressed importance of plavix with stent. Reviewed NTG use, walking for ex, heart healthy food choices, and CRP 2. Pt has had many back surgeries. Very interested in program. Referred to [place].Graylon Good RN BSN 07/01/2021 11:57 AM

## 2021-07-01 NOTE — Discharge Summary (Signed)
Discharge Summary for Same Day PCI   Patient ID: [name] MRN: [id number]; DOB: January 07, 1946  Admit date: 07/01/2021 Discharge date: 07/01/2021  Primary Care Provider: [name], MD  Primary Cardiologist: [name], MD  Primary Electrophysiologist:  None   Discharge Diagnoses    Active Problems:   Coronary artery disease involving native coronary artery of native heart with unstable angina pectoris [facility])    Diagnostic Studies/Procedures    Cardiac Catheterization 07/01/2021:   Prox LAD to Mid LAD lesion is 80% stenosed.   Mid LAD lesion is 80% stenosed.   A drug-eluting stent was successfully placed using a STENT ONYX FRONTIER 2.75X12, STENT ONYX FRONTIER 2.5 x 15, STENT ONYX FRONTIER 2.25 x 8.   Post intervention, there is a 0% residual stenosis.   Post intervention, there is a 0% residual stenosis.   Severe mid LAD stenosis Successful orbital atherectomy mid LAD with placement of 3 overlapping drug eluting stents.   Recommendations: Continue DAPT with ASA and Plavix for at least 12 months given long overlapping stent segment. Will discharge home today after 6 hours bedrest.  Diagnostic Dominance: Right    Intervention    _____________   History of Present Illness     [name] is a 75 y.o. male with past medical history of paroxysmal atrial flutter, hypertension and hyperlipidemia.  Recently seen in the office complaining of exertional dyspnea.  He was noted to have a CT scan with coronary calcifications.  Decision was made by Dr. Harrington Challenger to set up for outpatient cardiac catheterization.  Underwent cardiac catheterization on 06/18/2021 noting severe heavily calcified proximal and mid LAD stenosis which was flow-limiting by pressure wire.  Given his complex heavily calcified disease decision was made to bring back for staged PCI of the LAD with orbital atherectomy and stenting.  He was loaded with Plavix and started on aspirin.  Hospital Course     The  patient underwent cardiac cath as noted above with successful orbital atherectomy to the mid LAD with the placement of 3 drug-eluting stents. Plan for DAPT with ASA/Plavix for at least 1 year. The patient was seen by cardiac rehab while in short stay. There were no observed complications post cath. Radial cath site was re-evaluated prior to discharge and found to be stable without any complications. Instructions/precautions regarding cath site care were given prior to discharge. Discussed increasing Crestor from 5mg  to 20mg  daily on discharge.   [name] was seen by Dr. Angelena Form and determined stable for discharge home. Follow up with our office has been arranged. Medications are listed below. Pertinent changes include increased Crestor to 20mg  daily.  _____________  Cath/PCI Registry Performance & Quality Measures: Aspirin prescribed? - Yes ADP Receptor Inhibitor (Plavix/Clopidogrel, Brilinta/Ticagrelor or Effient/Prasugrel) prescribed (includes medically managed patients)? - Yes High Intensity Statin (Lipitor 40-80mg  or Crestor 20-40mg ) prescribed? - Yes For EF <40%, was ACEI/ARB prescribed? - Not Applicable (EF >/= 62%) For EF <40%, Aldosterone Antagonist (Spironolactone or Eplerenone) prescribed? - Not Applicable (EF >/= 94%) Cardiac Rehab Phase II ordered (Included Medically managed Patients)? - Yes  _____________   Discharge Vitals Blood pressure (!) 104/42, pulse (!) 34, temperature 99 F (37.2 C), temperature source Oral, resp. rate 11, height 5\' 11"  (1.803 m), weight 115.7 kg, SpO2 98 %.  Filed Weights   07/01/21 0541  Weight: 115.7 kg    Last Labs & Radiologic Studies    CBC No results for input(s): WBC, NEUTROABS, HGB, HCT, MCV, PLT in the last  72 hours. Basic Metabolic Panel No results for input(s): NA, K, CL, CO2, GLUCOSE, BUN, CREATININE, CALCIUM, MG, PHOS in the last 72 hours. Liver Function Tests No results for input(s): AST, ALT, ALKPHOS, BILITOT, PROT, ALBUMIN  in the last 72 hours. No results for input(s): LIPASE, AMYLASE in the last 72 hours. High Sensitivity Troponin:   No results for input(s): TROPONINIHS in the last 720 hours.  BNP Invalid input(s): POCBNP D-Dimer No results for input(s): DDIMER in the last 72 hours. Hemoglobin A1C No results for input(s): HGBA1C in the last 72 hours. Fasting Lipid Panel No results for input(s): CHOL, HDL, LDLCALC, TRIG, CHOLHDL, LDLDIRECT in the last 72 hours. Thyroid Function Tests No results for input(s): TSH, T4TOTAL, T3FREE, THYROIDAB in the last 72 hours.  Invalid input(s): FREET3 _____________  CARDIAC CATHETERIZATION  Addendum Date: 07/01/2021     Prox LAD to Mid LAD lesion is 80% stenosed.   Mid LAD lesion is 80% stenosed.   A drug-eluting stent was successfully placed using a STENT ONYX FRONTIER 2.75X12, STENT ONYX FRONTIER 2.5 x 15, STENT ONYX FRONTIER 2.25 x 8.   Post intervention, there is a 0% residual stenosis.   Post intervention, there is a 0% residual stenosis. Severe mid LAD stenosis Successful orbital atherectomy mid LAD with placement of 3 overlapping drug eluting stents. Recommendations: Continue DAPT with ASA and Plavix for at least 12 months given long overlapping stent segment. Will discharge home today after 6 hours bedrest.   Result Date: 07/01/2021 Formatting of this result is different from the original.   Prox LAD to Mid LAD lesion is 80% stenosed.   Mid LAD lesion is 80% stenosed.   A drug-eluting stent was successfully placed using a STENT ONYX FRONTIER 2.75X12, STENT ONYX FRONTIER 2.5 x 15, STENT ONYX FRONTIER 2.25 x 8.   Post intervention, there is a 0% residual stenosis.   Post intervention, there is a 0% residual stenosis. Severe mid LAD stenosis Successful orbital atherectomy mid LAD with placement of 3 overlapping drug eluting stents. Recommendations: Continue DAPT with ASA and Plavix for at least 12 months given long overlapping stent segment. Will discharge home today after  6 hours bedrest.   CARDIAC CATHETERIZATION  Result Date: 06/18/2021  Ost RCA to Prox RCA lesion is 50% stenosed.  Mid RCA lesion is 40% stenosed.  Dist RCA lesion is 30% stenosed.  Prox LAD to Mid LAD lesion is 80% stenosed.  Mid LAD lesion is 80% stenosed.  1. Severe, heavily calcified proximal and mid LAD stenosis. This is flow limiting by pressure wire analysis. 2. No obstructive disease in the Circumflex 3. Moderate non-obstructive disease in the ostial/proximal RCA, mid RCA 4. No significant elevation of right and left heart pressures Recommendations: He has complex heavily calcified disease in the proximal and mid LAD. The more proximal lesion involves a large septal branch and the mid lesion involves a large diagonal branch. He will be brought back in several weeks for staged PCI of the LAD with orbital atherectomy and stenting. I suspect that it will require a long segment of stenting from the proximal through mid LAD. Will load with Plavix as an outpatient.   CT CORONARY MORPH W/CTA COR W/SCORE W/CA W/CM &/OR WO/CM  Addendum Date: 06/14/2021   ADDENDUM REPORT: 06/14/2021 12:01 EXAM: OVER-READ INTERPRETATION  CT CHEST The following report is an over-read performed by radiologist Dr. Barnetta Hammersmith [facility] on 06/14/2021. This over-read does not include interpretation of cardiac or coronary anatomy or pathology.  The coronary CTA interpretation by the cardiologist is attached. COMPARISON:  CTA of the chest on 08/29/2017 FINDINGS: No significant noncardiac vascular findings. Stable small subpleural density in the peripheral right middle lobe at the level of the minor fissure which currently has an elongated appearance most likely consistent with scarring. Mildly prominent bilateral axillary lymph nodes again noted of unclear significance. The largest right axillary lymph node shows slight decrease in size since 2018 and now measures 15 mm in short axis compared to approximately 18  mm previously. Visualized upper abdomen and bony structures are unremarkable. IMPRESSION: 1. Stable subpleural density in the peripheral right middle lobe likely represents scarring. 2. Mildly prominent bilateral axillary lymph nodes again noted of unclear significance and potentially representing reactive nodes. The largest right axillary lymph node shows slight decrease in size since 2018. Electronically Signed   By: Aletta Edouard M.D.   On: 06/14/2021 12:01   Result Date: 06/14/2021 HISTORY: 75 yo male with dyspnea on exertion (DOE) EXAM: Cardiac/Coronary CTA TECHNIQUE: The patient was scanned on a [employer]. PROTOCOL: A 110 kV prospective scan was triggered in the descending thoracic aorta at 111 HU's. Axial non-contrast 3 mm slices were carried out through the heart. The data set was analyzed on a dedicated work station and scored using the [place]. Gantry rotation speed was 250 msecs and collimation was .6 mm. Beta blockade and 0.8 mg of sl NTG was given. The 3D data set was reconstructed in 10% intervals of the 0-90 % of the R-R cycle. Diastolic phases were analyzed on a dedicated work station using MPR, MIP and VRT modes. The patient received 64mL OMNIPAQUE IOHEXOL 350 MG/ML SOLN of contrast. FINDINGS: Quality: Fair to poor, attenuation artifact.  HR 50 Coronary calcium score: The patient's coronary artery calcium score is 1948, which places the patient in the 88th percentile. Coronary arteries: Normal coronary origins.  Right dominance. Right Coronary Artery: Dominant. Heavily calcified. Diffuse mixed mild 25-49% stenoses (CADRADS2), with likely moderate (CADRADS3) stenosis in the mid-vessel. Left Main Coronary Artery: Normal. Trifurcates into the LAD, small ramus intermedius branch and LCX arteries. Left Anterior Descending Coronary Artery: Heavily calcified proximal LAD with at least moderate 50-69% (CADRADS3) to possibly severe proximal stenosis. Smaller mid-diagonal branch with  minimal 1-24% mixed (CADRADS1) stenosis. Ramus Intermedius Coronary Artery: Smaller caliber, calcified proximally with probably minimal 1-24% stenosis (CADRADS1). Left Circumflex Artery: The LCx is poorly visualized. There is at least mild mixed 25-49% proximal stenosis (CADRADS2). Aorta: Normal size, 30 mm at the mid ascending aorta (level of the PA bifurcation) measured double oblique. Aortic atherosclerosis. No dissection. Aortic Valve: Trileaflet.  Annular calcification. Other findings: Normal pulmonary vein drainage into the left atrium. Normal left atrial appendage without a thrombus. Normal size of the pulmonary artery. IMPRESSION: 1. Heavy multivessel coronary calcification with moderate to possibly severe CAD of the LAD, CADRADS = 3. CT FFR will be performed and reported separately. 2. Coronary calcium score of 1948. This was 88th percentile for age and sex matched control. 3. Normal coronary origin with right dominance. 4. Aortic atherosclerosis. Electronically Signed: By: Pixie Casino M.D. On: 06/14/2021 11:02   CT CORONARY FRACTIONAL FLOW RESERVE FLUID ANALYSIS  Result Date: 06/14/2021 EXAM: CT FFR ANALYSIS CLINICAL DATA:  Chest pain/anginal equiv, 4yr CHD risk 10-20%, not treadmill candidate FINDINGS: FFRct analysis was performed on the original cardiac CT angiogram dataset. Diagrammatic representation of the FFRct analysis is provided in a separate PDF document in PACS. This dictation was created using  the PDF document and an interactive 3D model of the results. 3D model is not available in the EMR/PACS. Normal FFR range is >0.80. 1. Left Main:  No significant stenosis. FFR = 0.97 2. LAD: Significant stenosis. Proximal FFR = 0.94, Mid FFR = 0.71, Distal FFR = 0.68 3. LCX: No significant stenosis. Proximal FFR = 0.98, Distal FFR = not visualized 4. Ramus: No significant stenosis.  Proximal FFR = 0.87 5. RCA: No significant stenosis. Proximal FFR = 0.98, Mid FFR = 0.94, Distal FFR = 0.91  IMPRESSION: 1. CT FFR analysis demonstrates flow-limiting stenosis of the proximal to mid-LAD. The mid to distal circumflex is also not well-visualized. 2.  Recommend definitive cardiac catheterization. Electronically Signed   By: Pixie Casino M.D.   On: 06/14/2021 13:03    Disposition   Pt is being discharged home today in good condition.  Follow-up Plans & Appointments     Follow-up [facility] Follow up on 07/16/2021.   Specialty: Cardiology Why: at 2pm for your follow up appt with Dr. Alan Ripper NP Emelia Loron Contact information: [address] [address] [phone]               Discharge Instructions     Amb Referral to Cardiac Rehabilitation   Complete by: As directed    Diagnosis: Coronary Stents   After initial evaluation and assessments completed: Virtual Based Care may be provided alone or in conjunction with Phase 2 Cardiac Rehab based on patient barriers.: Yes        Discharge Medications   Allergies as of 07/01/2021       Reactions   Alfuzosin Other (See Comments)   Felt winded/faint   Gabapentin Nausea Only   Pregabalin Nausea Only, Other (See Comments)   Felt poorly   Tamsulosin Hcl Other (See Comments)   Orthostatic hypotension   Contrast Media [iodinated Diagnostic Agents] Rash   Cath dye 06/18/21 rash started next day   Niacin Diarrhea        Medication List     STOP taking these medications    celecoxib 200 MG capsule Commonly known as: CELEBREX   predniSONE 50 MG tablet Commonly known as: DELTASONE       TAKE these medications    amLODipine 5 MG tablet Commonly known as: NORVASC TAKE 1 TABLET BY MOUTH TWICE A DAY   amoxicillin 500 MG capsule Commonly known as: AMOXIL Take 2,000 mg by mouth See admin instructions. Take 4 capsules (2000 mg) by mouth 1 hour prior to dental appointments   aspirin EC 81 MG tablet Take 81 mg by mouth  every evening. Swallow whole.   B-complex with vitamin C tablet Take 1 tablet by mouth daily with lunch.   benazepril 20 MG tablet Commonly known as: LOTENSIN Take 1 tablet (20 mg total) by mouth daily.   clopidogrel 75 MG tablet Commonly known as: PLAVIX Take 600 mg (8 tablets) on Day 1 and then take 75 mg daily.  Start on 06/28/21.   COSAMIN DS PO Take 1 tablet by mouth every evening.   hydrochlorothiazide 12.5 MG capsule Commonly known as: MICROZIDE Take 12.5 mg by mouth daily with lunch.   methocarbamol 500 MG tablet Commonly known as: ROBAXIN Take 500 mg by mouth every 8 (eight) hours as needed for muscle spasms.   metoprolol tartrate 25 MG tablet Commonly known as: LOPRESSOR Take 25 mg by mouth daily as needed (afib.).   multivitamin  with minerals Tabs tablet Take 1 tablet by mouth daily with lunch. Adult 50+   nitroGLYCERIN 0.4 MG SL tablet Commonly known as: Nitrostat Place 1 tablet (0.4 mg total) under the tongue every 5 (five) minutes as needed.   oxyCODONE 5 MG immediate release tablet Commonly known as: Roxicodone Take 1-2 tablets (5-10 mg total) by mouth every 4 (four) hours as needed for severe pain or breakthrough pain.   rosuvastatin 20 MG tablet Commonly known as: CRESTOR Take 1 tablet (20 mg total) by mouth daily. What changed:  medication strength how much to take   scopolamine 1 MG/3DAYS Commonly known as: TRANSDERM-SCOP Place 1 patch onto the skin every three (3) days as needed (nausea).   vitamin C 500 MG tablet Commonly known as: ASCORBIC ACID Take 500 mg by mouth daily with lunch.   Vitamin D3 50 MCG (2000 UT) Tabs Take 2,000 Units by mouth daily with lunch. B complex, vitamin c 500   Zinc 50 MG Tabs Take 50 mg by mouth every evening.           Allergies Allergies  Allergen Reactions   Alfuzosin Other (See Comments)    Felt winded/faint   Gabapentin Nausea Only   Pregabalin Nausea Only and Other (See Comments)    Felt  poorly   Tamsulosin Hcl Other (See Comments)    Orthostatic hypotension   Contrast Media [Iodinated Diagnostic Agents] Rash    Cath dye 06/18/21 rash started next day   Niacin Diarrhea    Outstanding Labs/Studies   FLP/LFTs in 8 weeks  Duration of Discharge Encounter   Greater than 30 minutes including physician time.  Signed, Reino Bellis, NP 07/01/2021, 2:53 PM

## 2021-07-01 NOTE — Progress Notes (Signed)
Pt reports he took Benadryl 50 at 0500 today Prednisone 7-13 at 1830 Prednisone 7-14 0500

## 2021-07-01 NOTE — H&P (Signed)
Cardiology Admission History and Physical:   Patient ID: [name] MRN: [id number]; DOB: [birth year]   Admission date: 07/01/2021  PCP:  [name], MD   [facility] Providers Cardiologist:  [name], MD        Chief Complaint: Dyspnea   History of Present Illness:   [name] is a 75 yo male with history of HLD, HTN, PAF and new finding of CAD by cath in June 2022. Cardiac cath 06/18/21 with severe mid LAD stenosis. He is here today for PCI of the LAD with orbital atherectomy. Still having dyspnea with exertion.    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   Paroxysmal atrial flutter ([place])     Past Surgical History:  Procedure Laterality Date   ABDOMINAL EXPOSURE N/A 01/06/2020   Procedure: ABDOMINAL EXPOSURE;  Surgeon: Rosetta Posner, MD;  [facility];  Service: Vascular;  Laterality: N/A;   ANTERIOR LUMBAR FUSION N/A 01/06/2020   Procedure: Lumbar Five- Sacral One  Anterior lumbar interbody fusion with infuse;  Surgeon: Kristeen Miss, MD;  Location: [place];  Service: Neurosurgery;  Laterality: N/A;  Lumbar Five- Sacral One  Anterior lumbar interbody fusion with infuse   CERVICAL FUSION  1996, 1997   2 surgery- skull to C7   COLON RESECTION  2007   COLONOSCOPY W/ POLYPECTOMY     HEMORRHOID SURGERY N/A 01/13/2016   Procedure: HEMORRHOIDECTOMY AND REMOVAL OF ANAL SKIN TAG;  Surgeon: Coralie Keens, MD;  Location: [place];  Service: General;  Laterality: N/A;   INTRAVASCULAR PRESSURE WIRE/FFR STUDY N/A 06/18/2021   Procedure: INTRAVASCULAR PRESSURE  WIRE/FFR STUDY;  Surgeon: Burnell Blanks, MD;  Location: [place] [facility];  Service: Cardiovascular;  Laterality: N/A;   REPLACEMENT TOTAL KNEE Left 2005   left   RIGHT/LEFT HEART CATH AND CORONARY ANGIOGRAPHY N/A 06/18/2021   Procedure: RIGHT/LEFT HEART CATH AND CORONARY ANGIOGRAPHY;  Surgeon: Burnell Blanks, MD;  Location: [place] [facility];  Service: Cardiovascular;  Laterality: N/A;   SYNOVIAL CYST EXCISION     lumbar spine   TONSILLECTOMY     TOTAL KNEE ARTHROPLASTY Right 01/06/2017   Procedure: RIGHT TOTAL KNEE ARTHROPLASTY;  Surgeon: Sydnee Cabal, MD;  [facility];  Service: Orthopedics;  Laterality: Right;  Adductior Block     Medications Prior to Admission: Prior to Admission medications   Medication Sig Start Date End Date Taking? Authorizing Provider  amLODipine (NORVASC) 5 MG tablet TAKE 1 TABLET BY MOUTH TWICE A DAY 04/30/20  Yes Richardson Dopp T, PA-C  aspirin EC 81 MG tablet Take 81 mg by mouth every evening. Swallow whole.   Yes [provider]  B Complex-C (B-COMPLEX WITH VITAMIN C) tablet Take 1 tablet by mouth daily with lunch.   Yes [provider]  benazepril (LOTENSIN) 20 MG tablet Take 1 tablet (20 mg total) by mouth daily. 06/10/21  Yes Fay Records, MD  celecoxib (CELEBREX) 200 MG capsule Take 200 mg by mouth daily as needed for  pain. 04/27/21  Yes [provider]  Cholecalciferol (VITAMIN D3) 50 MCG (2000 UT) TABS Take 2,000 Units by mouth daily with lunch. B complex, vitamin c 500   Yes [provider]  clopidogrel (PLAVIX) 75 MG tablet Take 600 mg (8 tablets) on Day 1 and then take 75 mg daily.  Start on 06/28/21. 06/24/21  Yes Burnell Blanks, MD  Glucosamine-Chondroitin (COSAMIN DS PO) Take 1 tablet by mouth every evening.   Yes [provider]  hydrochlorothiazide (,MICROZIDE/HYDRODIURIL,) 12.5 MG capsule Take 12.5 mg by mouth daily with lunch.    Yes [provider]  methocarbamol  (ROBAXIN) 500 MG tablet Take 500 mg by mouth every 8 (eight) hours as needed for muscle spasms.   Yes [provider]  metoprolol tartrate (LOPRESSOR) 25 MG tablet Take 25 mg by mouth daily as needed (afib.).    Yes [provider]  Multiple Vitamin (MULTIVITAMIN WITH MINERALS) TABS tablet Take 1 tablet by mouth daily with lunch. Adult 50+   Yes [provider]  oxyCODONE (ROXICODONE) 5 MG immediate release tablet Take 1-2 tablets (5-10 mg total) by mouth every 4 (four) hours as needed for severe pain or breakthrough pain. 01/07/20  Yes Kristeen Miss, MD  predniSONE (DELTASONE) 50 MG tablet Take as directed 06/24/21  Yes Burnell Blanks, MD  rosuvastatin (CRESTOR) 5 MG tablet Take 1 tablet (5 mg total) by mouth daily. 05/31/21  Yes Fay Records, MD  vitamin C (ASCORBIC ACID) 500 MG tablet Take 500 mg by mouth daily with lunch.   Yes [provider]  Zinc 50 MG TABS Take 50 mg by mouth every evening.   Yes [provider]  amoxicillin (AMOXIL) 500 MG capsule Take 2,000 mg by mouth See admin instructions. Take 4 capsules (2000 mg) by mouth 1 hour prior to dental appointments    [provider]  scopolamine (TRANSDERM-SCOP) 1 MG/3DAYS Place 1 patch onto the skin every three (3) days as needed (nausea).    [provider]     Allergies:    Allergies  Allergen Reactions   Alfuzosin Other (See Comments)    Felt winded/faint   Gabapentin Nausea Only   Pregabalin Nausea Only and Other (See Comments)    Felt poorly   Tamsulosin Hcl Other (See Comments)    Orthostatic hypotension   Contrast Media [Iodinated Diagnostic Agents] Rash    Cath dye 06/18/21 rash started next day   Niacin Diarrhea    Social History:   Social History   Socioeconomic History   Marital status: Married    Spouse name: Zigmund Daniel   Number of children: 4   Years of education: 12   Highest education level: Not on file  Occupational History   Occupation: Retired     Fish farm manager: RETIRED  Tobacco Use   Smoking status: Never   Smokeless tobacco: Never  Vaping Use   Vaping Use: Never used  Substance and Sexual Activity   Alcohol use: No   Drug use: No   Sexual activity: Not on file  Other Topics Concern   Not on file  Social History Narrative   Patient lives at home with Wife Zigmund Daniel.    Patient has 4 children.    Patient is currently not working. He is retired and disabled.    Patient has a high school education.    Social Determinants of Health   Financial Resource Strain: Not on file  Food Insecurity: Not on file  Transportation Needs:  Not on file  Physical Activity: Not on file  Stress: Not on file  Social Connections: Not on file  Intimate Partner Violence: Not on file    Family History:   The patient's family history includes Coronary artery disease in his father and mother. There is no history of Colon cancer or Stomach cancer.    ROS:  Please see the history of present illness.  All other ROS reviewed and negative.     Physical Exam/Data:   Vitals:   07/01/21 0541  BP: (!) 126/51  Pulse: (!) 59  Temp: 99 F (37.2 C)  TempSrc: Oral  SpO2: 98%  Weight: 115.7 kg  Height: 5\' 11"  (1.803 m)   No intake or output data in the 24 hours ending 07/01/21 0729 Last 3 Weights 07/01/2021 06/18/2021 05/31/2021  Weight (lbs) 255 lb 256 lb 258 lb 3.2 oz  Weight (kg) 115.667 kg 116.121 kg 117.119 kg     Body mass index is 35.57 kg/m.  General:  Well nourished, well developed, in no acute distress HEENT: normal Lymph: no adenopathy Neck: no JVD Endocrine:  No thryomegaly Vascular: No carotid bruits; FA pulses 2+ bilaterally without bruits  Cardiac:  normal S1, S2; RRR; no murmur  Lungs:  clear to auscultation bilaterally, no wheezing, rhonchi or rales  Abd: soft, nontender, no hepatomegaly  Ext: no LE edema Musculoskeletal:  No deformities, BUE and BLE strength normal and equal Skin: warm and dry  Neuro:  CNs 2-12 intact, no focal  abnormalities noted Psych:  Normal affect   Laboratory Data:  High Sensitivity Troponin:  No results for input(s): TROPONINIHS in the last 720 hours.    Chemistry Recent Labs  Lab 06/28/21 1040  NA 138  K 5.0  CL 102  CO2 25  GLUCOSE 106*  BUN 27  CREATININE 1.16  CALCIUM 10.3*    No results for input(s): PROT, ALBUMIN, AST, ALT, ALKPHOS, BILITOT in the last 168 hours. Hematology Recent Labs  Lab 06/28/21 1040  WBC 8.1  RBC 4.18  HGB 13.3  HCT 39.8  MCV 95  MCH 31.8  MCHC 33.4  RDW 14.5  PLT 186   BNPNo results for input(s): BNP, PROBNP in the last 168 hours.  DDimer No results for input(s): DDIMER in the last 168 hours.   Radiology/Studies:  No results found.   Assessment and Plan:   CAD with unstable angina: Will plan PCI of the LAD today with orbital atherectomy.    Risk Assessment/Risk Scores:  { For questions or updates, please contact [place] Please consult [email or web address] for contact info under     Signed, [name], MD  07/01/2021 7:29 AM

## 2021-07-02 ENCOUNTER — Encounter (HOSPITAL_COMMUNITY): Payer: Self-pay | Admitting: Cardiovascular Disease

## 2021-07-05 ENCOUNTER — Telehealth: Payer: Self-pay | Admitting: Internal Medicine

## 2021-07-05 NOTE — Progress Notes (Signed)
[place] Phone: [phone] Subjective:   [name], am serving as a scribe for Dr. Hulan Saas.  This visit occurred during the SARS-CoV-2 public health emergency.  Safety protocols were in place, including screening questions prior to the visit, additional usage of staff PPE, and extensive cleaning of exam room while observing appropriate contact time as indicated for disinfecting solutions.   I'm seeing this patient by the request  of:  Tony Low, MD  CC: [name] back and neck pain follow-up  [id number]  [name] is a 75 y.o. male coming in with complaint of back and neck pain. History of spinal fusion and X level. Last surgery January 2020 which was a re-do of L5/S1. Also has entire cervical fusion. Also L3,4,5, S1 x2 are fused. Used bone growth stimulator for one year and then had another surgery to repair L5/S1 fusion.   Pain now on right sided lower back near incisions is constant. Patient is unable to exercise due to pain.  Patient states that it is constant.  Never without some type of discomfort.  Likely not having significant radicular symptoms though.  Pain in neck is constant. Uses oxycodone 5-10mg  daily, Celebrex, and robaxin for pain relief.   Patient was also admitted due to have stent placement and angioplasty done July 14. Patient took himself off of crestor yesterday. Feels ok today but was feeling fatigue.  Patient has not been cleared for activity by cardiology yet.   CT of the lumbar spine taken in August 2020 was independently visualized by me.  Patient did unfortunately have an L5-S1 fusion and it appeared that there was loosening of the S1 screws bilaterally right greater than left.  Patient has also had a past medical history for a L3-L5 fusion noted.  Patient did have severe left-sided L2-L3 narrowing with moderate central canal stenosis. Intra surgical imaging from January  2021 showed an anterior fusion at L5-S1 and interbody fusion that was loose seem to be removed.  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   Paroxysmal atrial flutter ([place])    Past Surgical History:  Procedure Laterality Date   ABDOMINAL EXPOSURE N/A 01/06/2020   Procedure: ABDOMINAL EXPOSURE;  Surgeon: Rosetta Posner, MD;  [facility];  Service: Vascular;  Laterality: N/A;   ANTERIOR LUMBAR FUSION N/A 01/06/2020   Procedure: Lumbar Five- Sacral One  Anterior lumbar interbody fusion with infuse;  Surgeon: Kristeen Miss, MD;  Location: [place];  Service: Neurosurgery;  Laterality: N/A;  Lumbar Five- Sacral One  Anterior lumbar interbody fusion with infuse   CERVICAL FUSION  1996, 1997   2 surgery- skull to C7   COLON RESECTION  2007   COLONOSCOPY W/ POLYPECTOMY     CORONARY ATHERECTOMY N/A 07/01/2021   Procedure: CORONARY ATHERECTOMY;  Surgeon: Burnell Blanks, MD;  Location: [place] [facility];  Service: Cardiovascular;  Laterality: N/A;   CORONARY STENT INTERVENTION N/A 07/01/2021   Procedure:  CORONARY STENT INTERVENTION;  Surgeon: Burnell Blanks, MD;  Location: [place] [facility];  Service: Cardiovascular;  Laterality: N/A;   HEMORRHOID SURGERY N/A 01/13/2016   Procedure: HEMORRHOIDECTOMY AND REMOVAL OF ANAL SKIN TAG;  Surgeon: Coralie Keens, MD;  Location: [place];  Service: General;  Laterality: N/A;   INTRAVASCULAR PRESSURE WIRE/FFR STUDY N/A 06/18/2021   Procedure: INTRAVASCULAR PRESSURE WIRE/FFR STUDY;  Surgeon: Burnell Blanks, MD;  Location: [place] [facility];  Service: Cardiovascular;  Laterality: N/A;   INTRAVASCULAR ULTRASOUND/IVUS N/A 07/01/2021   Procedure: Intravascular Ultrasound/IVUS;  Surgeon: Burnell Blanks, MD;  Location: [place] [facility];  Service: Cardiovascular;  Laterality: N/A;   REPLACEMENT TOTAL KNEE Left 2005   left   RIGHT/LEFT HEART CATH AND CORONARY ANGIOGRAPHY N/A 06/18/2021   Procedure: RIGHT/LEFT HEART CATH AND CORONARY ANGIOGRAPHY;  Surgeon: Burnell Blanks, MD;  Location: [place] [facility];  Service: Cardiovascular;  Laterality: N/A;   SYNOVIAL CYST EXCISION     lumbar spine   TONSILLECTOMY     TOTAL KNEE ARTHROPLASTY Right 01/06/2017   Procedure: RIGHT TOTAL KNEE ARTHROPLASTY;  Surgeon: Sydnee Cabal, MD;  [facility];  Service: Orthopedics;  Laterality: Right;  Adductior Block   Social History   Socioeconomic History   Marital status: Married    Spouse name: [name]   Number of children: 4   Years of education: 12   Highest education level: Not on file  Occupational History   Occupation: Retired    Fish farm manager: RETIRED  Tobacco Use   Smoking status: Never   Smokeless tobacco: Never  Vaping Use   Vaping Use: Never used  Substance and Sexual Activity   Alcohol use: No   Drug use: No   Sexual activity: Not on file  Other Topics Concern   Not on file  Social History Narrative   Patient lives at home with Wife [name].    Patient has 4 children.    Patient is currently not working. He is retired and disabled.    Patient has a high school education.    Social Determinants of Health   Financial Resource Strain: Not on file  Food Insecurity: Not on file  Transportation Needs: Not on file  Physical Activity: Not on file  Stress: Not on file  Social Connections: Not on file   Allergies  Allergen Reactions   Alfuzosin Other (See Comments)    Felt winded/faint   Gabapentin Nausea Only   Pregabalin Nausea Only and Other (See Comments)    Felt poorly   Tamsulosin Hcl Other  (See Comments)    Orthostatic hypotension   Contrast Media [Iodinated Diagnostic Agents] Rash    Cath dye 06/18/21 rash started next day   Niacin Diarrhea   Family History  Problem Relation Age of Onset   Coronary artery disease Mother    Coronary artery disease Father    Colon cancer Neg Hx    Stomach cancer Neg Hx      Current Outpatient Medications (Cardiovascular):    amLODipine (NORVASC) 5 MG tablet, TAKE 1 TABLET BY MOUTH TWICE A DAY   benazepril (LOTENSIN) 20 MG tablet, Take 1 tablet (20 mg total) by mouth daily.   hydrochlorothiazide (,MICROZIDE/HYDRODIURIL,) 12.5 MG capsule, Take 12.5 mg by mouth daily with lunch.    metoprolol tartrate (LOPRESSOR) 25 MG tablet, Take 25 mg by mouth daily as needed (afib.).    nitroGLYCERIN (NITROSTAT) 0.4 MG SL tablet, Place 1 tablet (0.4 mg total) under the tongue  every 5 (five) minutes as needed.   rosuvastatin (CRESTOR) 20 MG tablet, Take 1 tablet (20 mg total) by mouth daily.   Current Outpatient Medications (Analgesics):    aspirin EC 81 MG tablet, Take 81 mg by mouth every evening. Swallow whole.   oxyCODONE (ROXICODONE) 5 MG immediate release tablet, Take 1-2 tablets (5-10 mg total) by mouth every 4 (four) hours as needed for severe pain or breakthrough pain.  Current Outpatient Medications (Hematological):    clopidogrel (PLAVIX) 75 MG tablet, Take 600 mg (8 tablets) on Day 1 and then take 75 mg daily.  Start on 06/28/21.  Current Outpatient Medications (Other):    amoxicillin (AMOXIL) 500 MG capsule, Take 2,000 mg by mouth See admin instructions. Take 4 capsules (2000 mg) by mouth 1 hour prior to dental appointments   B Complex-C (B-COMPLEX WITH VITAMIN C) tablet, Take 1 tablet by mouth daily with lunch.   Cholecalciferol (VITAMIN D3) 50 MCG (2000 UT) TABS, Take 2,000 Units by mouth daily with lunch. B complex, vitamin c 500   Glucosamine-Chondroitin (COSAMIN DS PO), Take 1 tablet by mouth every evening.   methocarbamol (ROBAXIN)  500 MG tablet, Take 500 mg by mouth every 8 (eight) hours as needed for muscle spasms.   Multiple Vitamin (MULTIVITAMIN WITH MINERALS) TABS tablet, Take 1 tablet by mouth daily with lunch. Adult 50+   scopolamine (TRANSDERM-SCOP) 1 MG/3DAYS, Place 1 patch onto the skin every three (3) days as needed (nausea).   vitamin C (ASCORBIC ACID) 500 MG tablet, Take 500 mg by mouth daily with lunch.   Zinc 50 MG TABS, Take 50 mg by mouth every evening.   Reviewed prior external information including notes and imaging from  primary care provider As well as notes that were available from care everywhere and other healthcare systems.  Past medical history, social, surgical and family history all reviewed in electronic medical record.  No pertanent information unless stated regarding to the chief complaint.   Review of Systems:  No headache, visual changes, nausea, vomiting, diarrhea, constipation, dizziness, abdominal pain, skin rash, fevers, chills, night sweats, weight loss, swollen lymph nodes,  joint swelling, chest pain, shortness of breath, mood changes. POSITIVE muscle aches, body aches  Objective  Blood pressure 132/62, pulse 74, height 5\' 11"  (1.803 m), weight 255 lb (115.7 kg), SpO2 98 %.   General: No apparent distress alert and oriented x3 mood and affect normal, dressed appropriately.  HEENT: Pupils equal, extraocular movements intact  Respiratory: Patient's speak in full sentences and does not appear short of breath  Cardiovascular: No lower extremity edema, non tender, no erythema  Gait normal with good balance and coordination.  MSK: Arthritic changes of multiple joints. [name] back exam shows significant loss of lordosis.  Patient does have tightness with straight leg test and FABER test.  Patient neurovascularly intact distally.  Tender to palpation of the paraspinal musculature.  4+ out of 5 strength but symmetric bilaterally. Neck exam has very minimal range of motion in any plane.   Good grip strength noted.  Mild weakness in the C8 distribution bilaterally   Impression and Recommendations:     The above documentation has been reviewed and is accurate and complete Lyndal Pulley, DO

## 2021-07-05 NOTE — Telephone Encounter (Signed)
Pt c/o medication issue:  1. Name of Medication:  rosuvastatin (CRESTOR) 20 MG tablet  2. How are you currently taking this medication (dosage and times per day)?  1 tablet by mouth, once daily  3. Are you having a reaction (difficulty breathing--STAT)?  See below  4. What is your medication issue?   Patient states since this medication was increased from 5 MG to  20 MG daily, he has been constipated, having headaches, and feeling extremely lethargic. He states he feels as if he always has a heavy load on his shoulders. Denies SOB, pain, and additional symptoms. States he just always feels drained and fatigued.

## 2021-07-06 ENCOUNTER — Ambulatory Visit (INDEPENDENT_AMBULATORY_CARE_PROVIDER_SITE_OTHER): Payer: Medicare Other | Admitting: Family Medicine

## 2021-07-06 ENCOUNTER — Other Ambulatory Visit: Payer: Self-pay

## 2021-07-06 ENCOUNTER — Telehealth (HOSPITAL_COMMUNITY): Payer: Self-pay

## 2021-07-06 ENCOUNTER — Encounter: Payer: Self-pay | Admitting: Family Medicine

## 2021-07-06 VITALS — BP 132/62 | HR 74 | Ht 71.0 in | Wt 255.0 lb

## 2021-07-06 DIAGNOSIS — M5030 Other cervical disc degeneration, unspecified cervical region: Secondary | ICD-10-CM

## 2021-07-06 DIAGNOSIS — S32009K Unspecified fracture of unspecified lumbar vertebra, subsequent encounter for fracture with nonunion: Secondary | ICD-10-CM

## 2021-07-06 DIAGNOSIS — M545 Low back pain, unspecified: Secondary | ICD-10-CM

## 2021-07-06 DIAGNOSIS — G8929 Other chronic pain: Secondary | ICD-10-CM | POA: Diagnosis not present

## 2021-07-06 DIAGNOSIS — I2510 Atherosclerotic heart disease of native coronary artery without angina pectoris: Secondary | ICD-10-CM | POA: Diagnosis not present

## 2021-07-06 NOTE — Patient Instructions (Signed)
Aquatic therapy with [facility] Get approval from cardiology Can maybe start this possibly before cardiac rehab Vit D 2000IU daily Ice 2x a day 20 min  Check back in 2 months-30 min appts

## 2021-07-06 NOTE — Telephone Encounter (Signed)
I would advise patient hold rosuvastatin for 1 week, then resume at 10mg  daily. Call if he has any further issues

## 2021-07-06 NOTE — Telephone Encounter (Signed)
Called patient to see if he is interested in the Cardiac Rehab Program. Patient expressed interest. Explained scheduling process and went over insurance, patient verbalized understanding. Will contact patient for scheduling once f/u has been completed.  °

## 2021-07-06 NOTE — Telephone Encounter (Signed)
Pt insurance is active and benefits verified through Medicare a/b Co-pay 0, DED $233/$233 met, out of pocket 0/0 met, co-insurance 20%. no pre-authorization required. Passport, 07/06/2021@9 :17am, REF# [id number]  2ndary insurance is active and benefits verified through [place]. Co-pay 0, DED 0/0 met, out of pocket 0/0 met, co-insurance 0. No pre-authorization required. Passport, 07/06/2021@9 :19am, REF# [id number]  Will contact patient to see if he is interested in the Cardiac Rehab Program. If interested, patient will need to complete follow up appt. Once completed, patient will be contacted for scheduling upon review by the RN Navigator.

## 2021-07-07 ENCOUNTER — Encounter: Payer: Self-pay | Admitting: Family Medicine

## 2021-07-07 DIAGNOSIS — M5030 Other cervical disc degeneration, unspecified cervical region: Secondary | ICD-10-CM | POA: Insufficient documentation

## 2021-07-07 NOTE — Assessment & Plan Note (Signed)
Significant arthritic changes with effusions with 1 failure but recently fixed last year.  Patient does have poor core strength and I do think patient needs to start increasing activity.  Would consider the possibility of aquatic therapy but will need clearance from cardiology.  We will send a note to them to see if they have an estimate of when to get moving again.  Patient likely after being stented we will start with cardiac rehab but possibly starting with aquatic therapy may be more beneficial initially to gain strength so we can optimize that therapy and cardiac rehab.  Did not make any significant changes in medications at this time.  Encourage patient to continue to monitor weight.  Follow-up with me again in 6 to 8 weeks.

## 2021-07-07 NOTE — Telephone Encounter (Signed)
REstart crestor at 10 mg every other day    Follow  Increase to daily if tolerates

## 2021-07-07 NOTE — Assessment & Plan Note (Signed)
Patient likely will not make any significant improvement with the range of motion of the neck with the fusion of the complete cervical spine.  Likely no significant weakness of the hands noted at this time.  We will monitor but do not think significant changes will be possible.

## 2021-07-08 NOTE — Telephone Encounter (Signed)
Pt is in agreement to hold x 1 week and restart at 10 mg every other day, will increase to daily if tolerated with goal of being able to tolerate 20 mg daily.  Will call if continues to feel symptoms.

## 2021-07-12 ENCOUNTER — Other Ambulatory Visit: Payer: Self-pay | Admitting: Physician Assistant

## 2021-07-13 ENCOUNTER — Ambulatory Visit (HOSPITAL_BASED_OUTPATIENT_CLINIC_OR_DEPARTMENT_OTHER): Payer: Self-pay | Admitting: Physical Therapy

## 2021-07-16 ENCOUNTER — Other Ambulatory Visit: Payer: Self-pay

## 2021-07-16 ENCOUNTER — Encounter (HOSPITAL_BASED_OUTPATIENT_CLINIC_OR_DEPARTMENT_OTHER): Payer: Self-pay | Admitting: Family

## 2021-07-16 ENCOUNTER — Ambulatory Visit (INDEPENDENT_AMBULATORY_CARE_PROVIDER_SITE_OTHER): Payer: Medicare Other | Admitting: Family

## 2021-07-16 VITALS — BP 136/62 | HR 59 | Ht 71.0 in | Wt 256.0 lb

## 2021-07-16 DIAGNOSIS — I25118 Atherosclerotic heart disease of native coronary artery with other forms of angina pectoris: Secondary | ICD-10-CM

## 2021-07-16 DIAGNOSIS — I4892 Unspecified atrial flutter: Secondary | ICD-10-CM

## 2021-07-16 DIAGNOSIS — E785 Hyperlipidemia, unspecified: Secondary | ICD-10-CM | POA: Diagnosis not present

## 2021-07-16 NOTE — Progress Notes (Signed)
Office Visit    Patient Name: [name] Date of Encounter: 07/16/2021  PCP:  [name], [facility]  Cardiologist:  Tony Carnes, MD  Advanced Practice Provider:  No care team member to display Electrophysiologist:  None    Chief Complaint    [name] is a 75 y.o. male with a hx of paroxysmal atrial flutter, hypertension, hyperlipidemia, coronary artery disease s/p atherectomy and DES to LAD presents today for follow-up after PCI  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   Paroxysmal atrial flutter ([name])    Past Surgical History:  Procedure Laterality Date   ABDOMINAL EXPOSURE N/A 01/06/2020   Procedure: ABDOMINAL EXPOSURE;  Surgeon: Tony Posner, MD;  Location: [facility];  Service: Vascular;  Laterality: N/A;   ANTERIOR LUMBAR FUSION N/A 01/06/2020   Procedure: Lumbar Five- Sacral One  Anterior lumbar interbody fusion with infuse;  Surgeon: Tony Miss, MD;  Location: [place];  Service: Neurosurgery;  Laterality: N/A;  Lumbar Five- Sacral One  Anterior lumbar interbody fusion with infuse   CERVICAL FUSION  1996, 1997   2 surgery- skull to C7   COLON RESECTION  2007   COLONOSCOPY W/ POLYPECTOMY     CORONARY ATHERECTOMY N/A 07/01/2021   Procedure: CORONARY ATHERECTOMY;  Surgeon: Tony Blanks, MD;  Location: [place] [facility];  Service: Cardiovascular;  Laterality: N/A;   CORONARY STENT INTERVENTION N/A 07/01/2021   Procedure: CORONARY STENT INTERVENTION;  Surgeon: Tony Blanks, MD;  Location: [place] [facility];  Service: Cardiovascular;  Laterality: N/A;   HEMORRHOID SURGERY N/A 01/13/2016   Procedure: HEMORRHOIDECTOMY AND REMOVAL OF ANAL SKIN TAG;  Surgeon: Tony Keens, MD;  Location: [place];  Service: General;  Laterality: N/A;   INTRAVASCULAR PRESSURE WIRE/FFR STUDY N/A 06/18/2021   Procedure: INTRAVASCULAR PRESSURE WIRE/FFR STUDY;  Surgeon: Tony Blanks, MD;  Location: [place] [facility];  Service: Cardiovascular;  Laterality: N/A;   INTRAVASCULAR ULTRASOUND/IVUS N/A 07/01/2021   Procedure: Intravascular Ultrasound/IVUS;  Surgeon: Tony Blanks, MD;  Location: [place] [facility];  Service: Cardiovascular;  Laterality: N/A;   REPLACEMENT TOTAL KNEE Left 2005   left   RIGHT/LEFT HEART CATH AND CORONARY ANGIOGRAPHY N/A 06/18/2021   Procedure: RIGHT/LEFT HEART CATH AND CORONARY ANGIOGRAPHY;  Surgeon: Tony Blanks, MD;  Location: [place] [facility];  Service: Cardiovascular;  Laterality: N/A;   SYNOVIAL CYST EXCISION     lumbar spine   TONSILLECTOMY     TOTAL KNEE ARTHROPLASTY Right 01/06/2017   Procedure: RIGHT TOTAL KNEE ARTHROPLASTY;  Surgeon: Tony Cabal, MD;  Location: [facility];  Service: Orthopedics;  Laterality: Right;  Adductior Block    Allergies  Allergies  Allergen Reactions   Alfuzosin Other (See Comments)    Felt winded/faint   Gabapentin Nausea Only   Pregabalin Nausea Only and Other (See Comments)    Felt poorly   Tamsulosin Hcl Other (See Comments)  Orthostatic hypotension   Contrast Media [Iodinated Diagnostic Agents] Rash    Cath dye 06/18/21 rash started next day   Niacin Diarrhea    History of Present Illness    [name] is a 75 y.o. male with a hx of paroxysmal atrial flutter, hypertension, hyperlipidemia, coronary artery disease s/p atherectomy and DES to LAD last seen 07/01/2021 for coronary atherectomy.  He follows with Dr. Harrington [name].  He was seen in clinic 05/2021 noting exertional dyspnea.  He  had previous CT scan with coronary calcification.  He underwent cardiac catheterization 06/18/2021 noting severe heavily calcified proximal and mid LAD stenosis which was flow-limiting PressureWire.  Given complex heavily calcified and diseased decision was made during back for staged PCI of the LAD with orbital atherectomy and stenting.  Plavix load was completed.  He underwent cardiac catheterization 07/01/2021 with successful orbital atherectomy to the mid LAD with placement of DES x 3.  He was recommended for DAPT for 1 year.  He did contact the office 07/05/2021 noting that when [name] was increased until he was feeling constipated, headaches, lethargic.  His rosuvastatin was recommended to be held for 1 week and then resumed at 10 mg every other.  He presents today for follow-up. Notes still feeling poorly due to Rosuvastatin. Tells me every other day when he takes the Rosuvastatin he "feels like crap" and on the days that he takes it will feel poorly. He did just start every other day a few days ago. Reports no shortness of breath at rest and previous dyspnea on exertion is improving. Reports no chest pain, pressure, or tightness. No edema, orthopnea, PND. Reports no palpitations.  Enjoys spending time working in his yard and with his dogs. Stays very active.   EKGs/Labs/Other Studies Reviewed:   The following studies were reviewed today:  Cardiac Catheterization 07/01/2021:    Prox LAD to Mid LAD lesion is 80% stenosed.   Mid LAD lesion is 80% stenosed.   A drug-eluting stent was successfully placed using a STENT ONYX FRONTIER 2.75X12, STENT ONYX FRONTIER 2.5 x 15, STENT ONYX FRONTIER 2.25 x 8.   Post intervention, there is a 0% residual stenosis.   Post intervention, there is a 0% residual stenosis.   Severe mid LAD stenosis Successful orbital atherectomy mid LAD with placement of 3 overlapping drug eluting stents.   Recommendations: Continue DAPT with ASA and Plavix for at least 12 months  given long overlapping stent segment. Will discharge home today after 6 hours bedrest.   Diagnostic Dominance: Right      Intervention      _____________  Cardiac catheterization 06/18/2021 Ost RCA to Prox RCA lesion is 50% stenosed. Mid RCA lesion is 40% stenosed. Dist RCA lesion is 30% stenosed. Prox LAD to Mid LAD lesion is 80% stenosed. Mid LAD lesion is 80% stenosed.   1. Severe, heavily calcified proximal and mid LAD stenosis. This is flow limiting by pressure wire analysis. 2. No obstructive disease in the Circumflex 3. Moderate non-obstructive disease in the ostial/proximal RCA, mid RCA 4. No significant elevation of right and left heart pressures   Recommendations: He has complex heavily calcified disease in the proximal and mid LAD. The more proximal lesion involves a large septal branch and the mid lesion involves a large diagonal branch. He will be brought back in several weeks for staged PCI of the LAD with orbital atherectomy and stenting. I suspect that it will require a long segment of stenting from the proximal through mid  LAD. Will load with Plavix as an outpatient.  EKG:  EKG is  ordered today.  The ekg ordered today demonstrates sinus bradycardia 59 bpm with no acute ST/T wave changes.   Recent Labs: 06/28/2021: BUN 27; Creatinine, Ser 1.16; Hemoglobin 13.3; Platelets 186; Potassium 5.0; Sodium 138  Recent Lipid Panel    Component Value Date/Time   CHOL 152 08/10/2017 0937   TRIG 80 08/10/2017 0937   HDL 31 (L) 08/10/2017 0937   CHOLHDL 4.9 08/10/2017 0937   CHOLHDL 4.4 09/05/2016 1029   VLDL 22 09/05/2016 1029   LDLCALC 105 (H) 08/10/2017 0937   Home Medications   Current Meds  Medication Sig   amLODipine (NORVASC) 5 MG tablet TAKE 1 TABLET BY MOUTH TWICE A DAY   amoxicillin (AMOXIL) 500 MG capsule Take 2,000 mg by mouth See admin instructions. Take 4 capsules (2000 mg) by mouth 1 hour prior to dental appointments   aspirin EC 81 MG tablet Take 81 mg  by mouth every evening. Swallow whole.   B Complex-C (B-COMPLEX WITH VITAMIN C) tablet Take 1 tablet by mouth daily with lunch.   benazepril (LOTENSIN) 20 MG tablet Take 1 tablet (20 mg total) by mouth daily.   Cholecalciferol (VITAMIN D3) 50 MCG (2000 UT) TABS Take 2,000 Units by mouth daily with lunch. B complex, vitamin c 500   clopidogrel (PLAVIX) 75 MG tablet Take 600 mg (8 tablets) on Day 1 and then take 75 mg daily.  Start on 06/28/21.   Glucosamine-Chondroitin (COSAMIN DS PO) Take 1 tablet by mouth every evening.   hydrochlorothiazide (,MICROZIDE/HYDRODIURIL,) 12.5 MG capsule Take 12.5 mg by mouth daily with lunch.    methocarbamol (ROBAXIN) 500 MG tablet Take 500 mg by mouth every 8 (eight) hours as needed for muscle spasms.   metoprolol tartrate (LOPRESSOR) 25 MG tablet Take 25 mg by mouth daily as needed (afib.).    Multiple Vitamin (MULTIVITAMIN WITH MINERALS) TABS tablet Take 1 tablet by mouth daily with lunch. Adult 50+   nitroGLYCERIN (NITROSTAT) 0.4 MG SL tablet Place 1 tablet (0.4 mg total) under the tongue every 5 (five) minutes as needed.   oxyCODONE (ROXICODONE) 5 MG immediate release tablet Take 1-2 tablets (5-10 mg total) by mouth every 4 (four) hours as needed for severe pain or breakthrough pain.   rosuvastatin (CRESTOR) 5 MG tablet Patient take 2 tablets by mouth every other day (10 mg total)   scopolamine (TRANSDERM-SCOP) 1 MG/3DAYS Place 1 patch onto the skin every three (3) days as needed (nausea).   vitamin C (ASCORBIC ACID) 500 MG tablet Take 500 mg by mouth daily with lunch.   Zinc 50 MG TABS Take 50 mg by mouth every evening.     Review of Systems      All other systems reviewed and are otherwise negative except as noted above.  Physical Exam    VS:  BP 136/62   Pulse (!) 59   Ht '5\' 11"'$  (1.803 m)   Wt 256 lb (116.1 kg)   SpO2 97%   BMI 35.70 kg/m  , BMI Body mass index is 35.7 kg/m.  Wt Readings from Last 3 Encounters:  07/16/21 256 lb (116.1 kg)   07/06/21 255 lb (115.7 kg)  07/01/21 255 lb (115.7 kg)     GEN: Well nourished, well developed, in no acute distress. HEENT: normal. Neck: Supple, no JVD, carotid bruits, or masses. Cardiac: RRR, no murmurs, rubs, or gallops. No clubbing, cyanosis, edema.  Radials/PT 2+ and equal bilaterally.  Respiratory:  Respirations regular and unlabored, clear to auscultation bilaterally. GI: Soft, nontender, nondistended. MS: No deformity or atrophy. Skin: Warm and dry, no rash. R radial catheterization site with 1"x1" yellowed ecchymosis. No signs of infection.  Neuro:  Strength and sensation are intact. Psych: Normal affect.  Assessment & Plan    CAD s/p atherectomy and DESx3 to LAD 07/01/21 - Stable with no anginal symptoms. No indication for ischemic evaluation.  GDMT includes DAPT Aspirin/Plavix for at least 1 year, Rosuvastatin. No beta blocker due to baseline bradycardia. Plans to do water therapy for his back and do cardiac rehab after that. Heart healthy diet and regular cardiovascular exercise encouraged.    HLD, LDL goal less than 70 - 03/31/21 LDL 52, triglycerides 88. Tolerated Rosuvastatin '5mg'$  QD. Was increased to '20mg'$  QD during admission which made him fatigue and caused headache. He had 1 week statin holiday and has resumed at '10mg'$  QOD. Still notes some fatigue on days he takes it. Agreeable to trial for 2 weeks. If symptoms do not improve, plan to reduce to '5mg'$  QD and discuss with pharmacy team whether Zetia is indicated for additional cardioprotective benefit.   Symptomatic atrial flutter /bradycardia -previously extremely symptomatic when he had episodes of atrial flutter.  This is a remote occurrence.  No documented recurrence. He has PRN Metoprolol which is not needing.   Disposition: Follow up in 3 month(s) with Dr. Harrington [name] or APP.  Signed, Loel Dubonnet, NP 07/16/2021, 2:25 PM [facility]

## 2021-07-16 NOTE — Patient Instructions (Addendum)
Medication Instructions:  Continue your current medications.   If you continue to notice your fatigue is not improving with your Rosuvastatin (Crestor) '10mg'$  every other per day - let [name] know and we can consider changing your Rosuvastatin back to a lower dose.  *If you need a refill on your cardiac medications before your next appointment, please call your pharmacy*  Lab Work: None ordered today.   Testing/Procedures: Your EKG today showed sinus bradycardia which is a stable but slightly slightly slow heart rate.    Follow-Up: At [facility], you and your health needs are our priority.  As part of our continuing mission to provide you with exceptional heart care, we have created designated Provider Care Teams.  These Care Teams include your primary Cardiologist (physician) and Advanced Practice Providers (APPs -  Physician Assistants and Nurse Practitioners) who all work together to provide you with the care you need, when you need it.  We recommend signing up for the patient portal called "MyChart".  Sign up information is provided on this After Visit Summary.  MyChart is used to connect with patients for Virtual Visits (Telemedicine).  Patients are able to view lab/test results, encounter notes, upcoming appointments, etc.  Non-urgent messages can be sent to your provider as well.   To learn more about what you can do with MyChart, go to [email or web address].    Your next appointment:   3 months  The format for your next appointment:   In Person  Provider:   You may see [name], MD or one of the following Advanced Practice Providers on your designated Care Team:   Richardson Dopp, PA-C Robbie Lis, [place]   Other Instructions  You may participate in water therapy. You can complete your water therapy prior to participating in cardiac rehab. If we need to do a new referral we would be happy to do so.   For coronary artery disease often called "heart disease" we aim for optimal  guideline directed medical therapy. We use the "A, B, C"s to help keep [name] on track!  A = Aspirin '81mg'$  daily B = Blood pressure control.  C = Cholesterol control. You take Rosuvastatin to help control your cholesterol.  D = Don't forget nitroglycerin! This is an emergency tablet to be used if you have chest pain. E = Extras. In your case, this is Clopidogrel (Plavix) - this is an antiplatelet to protect your stent. You will take this for at least 1 year.

## 2021-07-19 ENCOUNTER — Ambulatory Visit (HOSPITAL_BASED_OUTPATIENT_CLINIC_OR_DEPARTMENT_OTHER): Payer: Medicare Other | Attending: Family Medicine | Admitting: Physical Therapy

## 2021-07-19 ENCOUNTER — Encounter (HOSPITAL_BASED_OUTPATIENT_CLINIC_OR_DEPARTMENT_OTHER): Payer: Self-pay | Admitting: Physical Therapy

## 2021-07-19 ENCOUNTER — Other Ambulatory Visit: Payer: Self-pay

## 2021-07-19 DIAGNOSIS — R2689 Other abnormalities of gait and mobility: Secondary | ICD-10-CM | POA: Insufficient documentation

## 2021-07-19 DIAGNOSIS — G8929 Other chronic pain: Secondary | ICD-10-CM | POA: Diagnosis not present

## 2021-07-19 DIAGNOSIS — M545 Low back pain, unspecified: Secondary | ICD-10-CM | POA: Diagnosis not present

## 2021-07-19 DIAGNOSIS — M542 Cervicalgia: Secondary | ICD-10-CM

## 2021-07-20 ENCOUNTER — Encounter (HOSPITAL_BASED_OUTPATIENT_CLINIC_OR_DEPARTMENT_OTHER): Payer: Self-pay | Admitting: Physical Therapy

## 2021-07-20 NOTE — Therapy (Signed)
[place], [place], [address] Phone: [phone]   Fax:  [phone]  Physical Therapy Evaluation  Patient Details  Name: [name] MRN: [id number] Date of Birth: Oct 21, 1946 Referring Provider (PT): Hulan Saas DO   Encounter Date: 07/19/2021   PT End of Session - 07/19/21 1355     Visit Number 1    Number of Visits 12    Date for PT Re-Evaluation 08/30/21    Authorization Type medicare    PT Start Time 1345    PT Stop Time [id number]    PT Time Calculation (min) 43 min    Activity Tolerance Patient tolerated treatment well    Behavior During Therapy [facility] for tasks assessed/performed             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   Paroxysmal atrial flutter ([place])     Past Surgical History:  Procedure Laterality Date   ABDOMINAL EXPOSURE N/A 01/06/2020   Procedure: ABDOMINAL EXPOSURE;  Surgeon: Rosetta Posner, MD;  Location: [facility];  Service: Vascular;  Laterality: N/A;   ANTERIOR LUMBAR FUSION N/A 01/06/2020   Procedure: Lumbar Five- Sacral One  Anterior lumbar interbody fusion with infuse;  Surgeon: Kristeen Miss, MD;  Location: [facility];  Service: Neurosurgery;  Laterality: N/A;  Lumbar Five- Sacral One  Anterior lumbar interbody fusion with infuse   CERVICAL FUSION  1996, 1997   2 surgery- skull to C7   COLON RESECTION  2007   COLONOSCOPY W/ POLYPECTOMY     CORONARY ATHERECTOMY N/A 07/01/2021   Procedure: CORONARY ATHERECTOMY;  Surgeon: Burnell Blanks, MD;   Location: [place] [facility];  Service: Cardiovascular;  Laterality: N/A;   CORONARY STENT INTERVENTION N/A 07/01/2021   Procedure: CORONARY STENT INTERVENTION;  Surgeon: Burnell Blanks, MD;  Location: [place] [facility];  Service: Cardiovascular;  Laterality: N/A;   HEMORRHOID SURGERY N/A 01/13/2016   Procedure: HEMORRHOIDECTOMY AND REMOVAL OF ANAL SKIN TAG;  Surgeon: Coralie Keens, MD;  Location: [place];  Service: General;  Laterality: N/A;   INTRAVASCULAR PRESSURE WIRE/FFR STUDY N/A 06/18/2021   Procedure: INTRAVASCULAR PRESSURE WIRE/FFR STUDY;  Surgeon: Burnell Blanks, MD;  Location: [facility];  Service: Cardiovascular;  Laterality: N/A;   INTRAVASCULAR ULTRASOUND/IVUS N/A 07/01/2021   Procedure: Intravascular Ultrasound/IVUS;  Surgeon: Burnell Blanks, MD;  Location: [place] [facility];  Service: Cardiovascular;  Laterality: N/A;   REPLACEMENT TOTAL KNEE Left 2005   left   RIGHT/LEFT HEART CATH AND CORONARY ANGIOGRAPHY N/A 06/18/2021   Procedure: RIGHT/LEFT HEART CATH AND CORONARY ANGIOGRAPHY;  Surgeon: Burnell Blanks, MD;  Location: [place] [facility];  Service: Cardiovascular;  Laterality: N/A;   SYNOVIAL CYST EXCISION     lumbar spine   TONSILLECTOMY     TOTAL KNEE ARTHROPLASTY Right 01/06/2017   Procedure: RIGHT TOTAL KNEE ARTHROPLASTY;  Surgeon: Sydnee Cabal, MD;  Location: [facility];  Service: Orthopedics;  Laterality: Right;  Adductior Block  There were no vitals filed for this visit.    Subjective Assessment - 07/19/21 1350     Subjective Patient reports he bends at the hips. Lots of pain at lower back. Neurosurgern appointment Wed to look at pain mangement. cannot due to much exercises without pain, on lifitng precautions, back worse than neck pain. Patient seen Dr. Tamala [name] for follow up. Patient reports he cnanot move due to limitations, Patient uses cervical collar to reliefment and comformt but is trying to limit unless needed.  Cardiologist: Patient stated he was cleared for activity 07/16/21    Pertinent History skull through C7 fusion, 8 surgeries, L3-S1 fusion done twice.    Limitations Sitting;Lifting;Standing;Walking    How long can you sit comfortably? limited must continue to move.    How long can you stand comfortably? limited due to pain. <5    How long can you walk comfortably? limited 2-3 mins before pain starts. cannot walk due to pain. at most across parking lot.    Diagnostic tests CT of the lumbar spine taken in August 2020 was independently visualized by me.  Patient did unfortunately have an L5-S1 fusion and it appeared that there was loosening of the S1 screws bilaterally right greater than left.  Patient has also had a past medical history for a L3-L5 fusion noted.  Patient did have severe left-sided L2-L3 narrowing with moderate central canal stenosis.  Intra surgical imaging from January 2021 showed an anterior fusion at L5-S1 and interbody fusion that was loose seem to be removed.    Patient Stated Goals get out and walk, 1 mile and back. [place] park and wants to walk. wants to improve stregnth of LE in order to ambulate safely especially steps and stairs. (can only go up 3-5 steps with fatigue with hand rail),    Currently in Pain? Yes    Pain Score 5     Pain Location Back    Pain Orientation Right;Left    Pain Descriptors / Indicators Aching;Constant    Pain Type Chronic pain    Pain Radiating Towards no.    Pain Onset More than a month ago    Pain Frequency Constant    Aggravating Factors  walking, sleeping    Pain Relieving Factors medication and rest    Effect of Pain on Daily Activities cannot walk for long periods of time    Multiple Pain Sites Yes    Pain Score 5    Pain Location Neck    Pain Orientation Right;Left    Pain Type Chronic pain    Pain Radiating Towards no    Pain Onset More than a month ago    Pain Frequency Constant    Pain Relieving Factors medication    Effect  of Pain on Daily Activities difficulty walking due to not be able to move neck                [facility] PT Assessment - 07/20/21 0001       Assessment   Medical Diagnosis Low back pain and cervical neck pain    Referring Provider (PT) Hulan Saas DO    Onset Date/Surgical Date 12/19/20    Prior Therapy Yes. had therapy for his knees but limited therapy for his back      Precautions   Precautions Back    Precaution Comments no lifitng heavy objects      [place] residence    Available Help at Discharge  Family    Type of [place] to enter    Entrance Stairs-Number of Steps 3      Prior Function   Level of Independence Independent with basic ADLs    Vocation Retired    Leisure wants to walk dog and walk around park      [employer]   Attention Focused    Focused Attention Appears intact    Memory Appears intact    Awareness Appears intact    Problem Solving Appears intact      Sensation   Light Touch Appears Intact      Coordination   Gross Motor Movements are Fluid and Coordinated Yes    Fine Motor Movements are Fluid and Coordinated Yes      Posture/Postural Control   Posture/Postural Control Postural limitations    Postural Limitations Rounded Shoulders;Forward head;Decreased lumbar lordosis    Posture Comments forward head, rounded shoulder, decreased lordosis of lower back      AROM   Overall AROM Comments Patient is able to use hip in order to flex foward and rotate as he has limited mobility in lumbar.    Cervical Flexion None due to complete Fusion of C-spine    Cervical Extension None due to complete Fusion of C-spine    Cervical - Right Side Bend None due to complete Fusion of C-spine    Cervical - Left Side Bend None due to complete Fusion of C-spine    Cervical - Right Rotation' \\None'$  due to complete Fusion of C-spine    Cervical - Left Rotation None due to complete Fusion of C-spine     Lumbar Flexion Limited due to fusion of L3-S1    Lumbar Extension Limited due to fusion of L3-S1    Lumbar - Right Side Bend Limited due to fusion of L3-S1    Lumbar - Left Side Bend Limited due to fusion of L3-S1    Lumbar - Right Rotation Limited due to fusion of L3-S1    Lumbar - Left Rotation Limited due to fusion of L3-S1      Strength   Right Hip Flexion 4/5    Right Hip ABduction 4/5    Right Hip ADduction 4/5    Left Hip Flexion 4/5    Left Hip ABduction 4/5    Left Hip ADduction 4/5    Right Knee Flexion 4/5    Right Knee Extension 4-/5    Left Knee Flexion 4/5    Left Knee Extension 4-/5      Transfers   Transfers Sit to Stand;Stand to Sit    Sit to Stand 7: Independent    Stand to Sit 7: Independent    Comments uses hands to stand and sit down      Ambulation/Gait   Gait Comments Antalgic gait with decrease step length and cadence. Patient walks slow in order to low ahead as he has limited motion in his cervical spine and cannot look down.                        Objective measurements completed on examination: See above findings.       [facility] Adult PT Treatment/Exercise - 07/20/21 0001       Exercises   Exercises Knee/Hip      Knee/Hip Exercises: Seated   Long Arc Quad Strengthening;Both;2 sets;10 reps    Long Arc Quad Limitations red band 2x10    Clamshell with  TheraBand Red    Other Seated Knee/Hip Exercises 2x10 seated hip abduction with red band    Other Seated Knee/Hip Exercises 2x10 seated hip march with red band                    PT Education - 07/19/21 1546     Education Details reviewed HEp and symptom mangement    Person(s) Educated Patient    Methods Explanation;Demonstration;Tactile cues;Verbal cues    Comprehension Verbal cues required;Tactile cues required;Returned demonstration;Verbalized understanding              PT Short Term Goals - 07/20/21 1036       PT SHORT TERM GOAL #1   Title Patient will  report increase knee extension strength to 4/5 bilaterally.    Baseline 4-/5    Time 3    Period Weeks    Status New    Target Date 08/10/21      PT SHORT TERM GOAL #2   Title Patient will report decrease pain by 30%.    Baseline 5/10 neck, 5/10 lower back    Time 3    Period Weeks    Status New    Target Date 08/10/21      PT SHORT TERM GOAL #3   Title Patient will report increase in 5x sit to stand by 3 seconds    Time 4    Period Weeks    Status New    Target Date 08/17/21               PT Long Term Goals - 07/20/21 1009       PT LONG TERM GOAL #1   Title Patient will improve lower extremity strenght to 5/5 throughout in order to ambulate 10 steps with decreased fatigue.    Baseline 3 steps with fatigue    Time 6    Period Weeks    Status New    Target Date 08/31/21      PT LONG TERM GOAL #2   Title Patient will improve distance by 400 feet in 6 minute walk test in order walk ambulate 2 miles around community park.    Time 6    Period Weeks    Status New    Target Date 08/30/21      PT LONG TERM GOAL #3   Title Patient will be independent with land and aquatic HEP in order to perform safely at home.    Baseline inital HEP    Time 6    Period Weeks    Status New    Target Date 08/30/21                    Plan - 07/19/21 1555     Clinical Impression Statement Patient presents with chronic low back pain without sciatic pain. Patient has had L3-S1 fused twice with last one on January 2020. Patient has entire cervical fusion and has little to no movement in cervical spine motion. Patient has no radicular symptoms down arm and full shoulder ROM. Patient is unable to exercises due to constant pain in back and neck. Patient presents with decreased strength in lower extremity, gait abnormalities with forward trunk lean, and decreased cervical/lumbar range of motion. Patient was able to tolerated treatment well today with red banded exercises for his hip  with no increase in pain or discomfort. Patient was also admitted due to having stent placement and angioplasty done July 14 and was allowed  to participate in aquatic/water therapy by cardiologist. Patient Patient will benefit from skilled physical therapy treatment in order to increase strength of lower extremity, improve balance, and improve mobility.    Personal Factors and Comorbidities Fitness;Time since onset of injury/illness/exacerbation;Comorbidity 3+;Past/Current Experience    Comorbidities stent palcement, bilateral knee replacements; complete cervical fusion    Examination-Activity Limitations Squat;Stairs;Stand;Locomotion Level;Transfers    Examination-Participation Restrictions Community Activity;Shop;Meal Prep;Cleaning;Yard Work    [employer] Evolving/Moderate complexity   progressive decrease in ability to stand   Clinical Decision Making Moderate    Rehab Potential Good   for stated goals   PT Frequency 2x / week    PT Duration 6 weeks    PT Treatment/Interventions ADLs/Self Care Home Management;Cryotherapy;Electrical Stimulation;Iontophoresis '4mg'$ /ml Dexamethasone;Moist Heat;Traction;Ultrasound;DME Instruction;Gait training;Stair training;Functional mobility training;Therapeutic activities;Therapeutic exercise;Neuromuscular re-education;Patient/family education;Manual techniques;Passive range of motion;Taping    PT Next Visit Plan Begin aquatic therapy exercise plan with introduction to pool and how to be safe getting in and out of pool. Perform 5x sit to stand and/or 6 minute walk test during next land visit in order access endurance and balance. Manuel therapy to decrease muscle tightness and spasm.    PT Home Exercise Plan Red Band: seated hip abduction 3x10, seated hip march 3x10, seated knee extension 3x10    Consulted and Agree with Plan of Care Patient             Patient will benefit from skilled therapeutic intervention in order to improve the  following deficits and impairments:  Abnormal gait, Decreased endurance, Decreased mobility, Difficulty walking, Increased muscle spasms, Pain, Decreased activity tolerance, Decreased knowledge of precautions, Decreased range of motion, Decreased strength, Increased fascial restricitons  Visit Diagnosis: Chronic bilateral low back pain without sciatica - Plan: PT plan of care cert/re-cert  Cervicalgia - Plan: PT plan of care cert/re-cert  Other abnormalities of gait and mobility - Plan: PT plan of care cert/re-cert     Problem List Patient Active Problem List   Diagnosis Date Noted   Degenerative cervical disc 07/07/2021   Coronary artery disease involving native coronary artery of native heart with unstable angina pectoris ([place])    Coronary artery disease of native artery of native heart with stable angina pectoris ([facility])    Pseudoarthrosis of lumbar spine 01/06/2020   Coronary artery calcification seen on CT scan 01/29/2019   Change in bowel habits 06/12/2018   Primary osteoarthritis of right knee 01/06/2017   S/P knee replacement 01/06/2017   Diplopia 07/10/2013   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    [facility] [address] [place], [place], [address] Phone: [phone]   Fax:  [phone]  Name: [name] MRN: [id number] Date of Birth: 30-Apr-1946

## 2021-07-20 NOTE — Patient Instructions (Signed)
Access Code: [email or web address] URL: [email or web address] Date: 07/20/2021 Prepared by: Carolyne Littles  Exercises Seated Knee Extension with Resistance - 1 x daily - 7 x weekly - 3 sets - 10 reps Seated March with Resistance - 1 x daily - 7 x weekly - 3 sets - 10 reps Seated Hip Abduction with Resistance - 1 x daily - 7 x weekly - 3 sets - 10 reps

## 2021-07-22 ENCOUNTER — Encounter (HOSPITAL_BASED_OUTPATIENT_CLINIC_OR_DEPARTMENT_OTHER): Payer: Self-pay | Admitting: Physical Therapy

## 2021-07-22 ENCOUNTER — Other Ambulatory Visit: Payer: Self-pay

## 2021-07-22 ENCOUNTER — Ambulatory Visit (HOSPITAL_BASED_OUTPATIENT_CLINIC_OR_DEPARTMENT_OTHER): Payer: Medicare Other | Admitting: Physical Therapy

## 2021-07-22 DIAGNOSIS — R2689 Other abnormalities of gait and mobility: Secondary | ICD-10-CM | POA: Diagnosis not present

## 2021-07-22 DIAGNOSIS — M545 Low back pain, unspecified: Secondary | ICD-10-CM

## 2021-07-22 DIAGNOSIS — M542 Cervicalgia: Secondary | ICD-10-CM

## 2021-07-22 DIAGNOSIS — G8929 Other chronic pain: Secondary | ICD-10-CM

## 2021-07-22 NOTE — Therapy (Signed)
[facility] [address] [place], [place], [address] Phone: [phone]   Fax:  [phone]  Physical Therapy Treatment  Patient Details  Name: [name] MRN: [id number] Date of Birth: [birth year] Referring Provider (PT): Hulan Saas DO   Encounter Date: 07/22/2021   PT End of Session - 07/22/21 0959     Visit Number 2    Number of Visits 12    Date for PT Re-Evaluation 08/30/21    Authorization Type medicare    PT Start Time 1015    PT Stop Time 1055    PT Time Calculation (min) 40 min    Activity Tolerance Patient tolerated treatment well    Behavior During Therapy [facility] for tasks assessed/performed             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   Paroxysmal atrial flutter ([place])     Past Surgical History:  Procedure Laterality Date   ABDOMINAL EXPOSURE N/A 01/06/2020   Procedure: ABDOMINAL EXPOSURE;  Surgeon: Rosetta Posner, MD;  [facility];  Service: Vascular;  Laterality: N/A;   ANTERIOR LUMBAR FUSION N/A 01/06/2020   Procedure: Lumbar Five- Sacral One  Anterior lumbar interbody fusion with infuse;  Surgeon: Kristeen Miss, MD;  Location: [facility];  Service: Neurosurgery;  Laterality: N/A;  Lumbar Five- Sacral One  Anterior lumbar interbody fusion with infuse   CERVICAL FUSION  1996, 1997   2 surgery- skull to C7   COLON RESECTION  2007   COLONOSCOPY W/ POLYPECTOMY     CORONARY ATHERECTOMY N/A 07/01/2021   Procedure: CORONARY ATHERECTOMY;  Surgeon: Burnell Blanks, MD;  Location:  [place];  Service: Cardiovascular;  Laterality: N/A;   CORONARY STENT INTERVENTION N/A 07/01/2021   Procedure: CORONARY STENT INTERVENTION;  Surgeon: Burnell Blanks, MD;  Location: [facility];  Service: Cardiovascular;  Laterality: N/A;   HEMORRHOID SURGERY N/A 01/13/2016   Procedure: HEMORRHOIDECTOMY AND REMOVAL OF ANAL SKIN TAG;  Surgeon: Coralie Keens, MD;  Location: [facility];  Service: General;  Laterality: N/A;   INTRAVASCULAR PRESSURE WIRE/FFR STUDY N/A 06/18/2021   Procedure: INTRAVASCULAR PRESSURE WIRE/FFR STUDY;  Surgeon: Burnell Blanks, MD;  Location: [place] [facility];  Service: Cardiovascular;  Laterality: N/A;   INTRAVASCULAR ULTRASOUND/IVUS N/A 07/01/2021   Procedure: Intravascular Ultrasound/IVUS;  Surgeon: Burnell Blanks, MD;  Location: [facility];  Service: Cardiovascular;  Laterality: N/A;   REPLACEMENT TOTAL KNEE Left 2005   left   RIGHT/LEFT HEART CATH AND CORONARY ANGIOGRAPHY N/A 06/18/2021   Procedure: RIGHT/LEFT HEART CATH AND CORONARY ANGIOGRAPHY;  Surgeon: Burnell Blanks, MD;  Location: [facility] [facility];  Service: Cardiovascular;  Laterality: N/A;   SYNOVIAL CYST EXCISION     lumbar spine   TONSILLECTOMY     TOTAL KNEE ARTHROPLASTY Right 01/06/2017   Procedure: RIGHT TOTAL KNEE ARTHROPLASTY;  Surgeon: Sydnee Cabal, MD;  [facility];  Service: Orthopedics;  Laterality: Right;  Adductior Block  There were no vitals filed for this visit.   Subjective Assessment - 07/22/21 0958     Subjective Pt states he has been compliant with HEP and he is a little sore into his back. He has been adding resistance to his HEP through his use of band.    Pertinent History skull through C7 fusion, 8 surgeries, L3-S1 fusion done twice.    Limitations Sitting;Lifting;Standing;Walking    How long can you sit comfortably? limited must continue to move.    How long can you stand comfortably? limited due to pain. <5    How  long can you walk comfortably? limited 2-3 mins before pain starts. cannot walk due to pain. at most across parking lot.    Diagnostic tests CT of the lumbar spine taken in August 2020 was independently visualized by me.  Patient did unfortunately have an L5-S1 fusion and it appeared that there was loosening of the S1 screws bilaterally right greater than left.  Patient has also had a past medical history for a L3-L5 fusion noted.  Patient did have severe left-sided L2-L3 narrowing with moderate central canal stenosis.  Intra surgical imaging from January 2021 showed an anterior fusion at L5-S1 and interbody fusion that was loose seem to be removed.    Patient Stated Goals get out and walk, 1 mile and back. [place] park and wants to walk. wants to improve stregnth of LE in order to ambulate safely especially steps and stairs. (can only go up 3-5 steps with fatigue with hand rail),    Currently in Pain? Yes    Pain Score 5     Pain Location Back    Pain Orientation Lower    Pain Descriptors / Indicators Aching;Constant    Pain Onset More than a month ago    Pain Onset More than a month ago                 Pt seen for aquatic therapy today.  Treatment took place in water 3.25-4 ft in depth at the [facility] pool. Temp of water was 91.  Pt entered/exited the pool via stairs (alternating) with supervision and bilat rail.  Warm up: sidestepping, retro walking, 4x each standing flexion stretch at edge of pool 30s 3x Exercises: seated cycling on bench 20x, standing board press and row in half squat 20x, standing board shoulder extension 2x10 , standing hip ABD (fast and slow) 2x10; standing march 20x, STS off bench 2x10, noodle HS and quad stretch 30s 3x   Pt requires buoyancy for support and to offload joints with strengthening exercises. Viscosity of the water is needed for resistance of strengthening; water current perturbations provides challenge to standing balance unsupported,  requiring increased core activation.         PT Education - 07/22/21 0958     Education Details anatomy, exercise progression, DOMS expectations, muscle firing, HEP, pool therapy recovery    Person(s) Educated Patient    Methods Explanation;Tactile cues;Verbal cues;Demonstration    Comprehension Verbalized understanding;Returned demonstration;Verbal cues required;Tactile cues required              PT Short Term Goals - 07/20/21 1036       PT SHORT TERM GOAL #1   Title Patient will report increase knee extension strength to 4/5 bilaterally.    Baseline 4-/5    Time 3    Period Weeks    Status New    Target Date 08/10/21      PT  SHORT TERM GOAL #2   Title Patient will report decrease pain by 30%.    Baseline 5/10 neck, 5/10 lower back    Time 3    Period Weeks    Status New    Target Date 08/10/21      PT SHORT TERM GOAL #3   Title Patient will report increase in 5x sit to stand by 3 seconds    Time 4    Period Weeks    Status New    Target Date 08/17/21               PT Long Term Goals - 07/20/21 1009       PT LONG TERM GOAL #1   Title Patient will improve lower extremity strenght to 5/5 throughout in order to ambulate 10 steps with decreased fatigue.    Baseline 3 steps with fatigue    Time 6    Period Weeks    Status New    Target Date 08/31/21      PT LONG TERM GOAL #2   Title Patient will improve distance by 400 feet in 6 minute walk test in order walk ambulate 2 miles around community park.    Time 6    Period Weeks    Status New    Target Date 08/30/21      PT LONG TERM GOAL #3   Title Patient will be independent with land and aquatic HEP in order to perform safely at home.    Baseline inital HEP    Time 6    Period Weeks    Status New    Target Date 08/30/21                   Plan - 07/22/21 1301     Clinical Impression Statement Pt with good tolerance to aquatic therapy at today's session. Pt was able to perform  sustained repetitions of LE and lumbopelvic exercise without increased pain or discomfort. Pt had increased hip stiffness and soft tissue flexibility restrictions that were moderately relieve with self pool stretching. Pt required increased VC and TC for hip hinging and postural correction during standing exercise due to preference for forward trunk lean and quad dominant movements. Pt likely able to progress to more SL stability at next session if soreness and pain is not increased. Pt would benefit from continued skilled therapy in order to reach goals and maximize functional lumbopelvic strength and ROM for prevention of further functional decline.    Personal Factors and Comorbidities Fitness;Time since onset of injury/illness/exacerbation;Comorbidity 3+;Past/Current Experience    Comorbidities stent palcement, bilateral knee replacements; complete cervical fusion    Examination-Activity Limitations Squat;Stairs;Stand;Locomotion Level;Transfers    Examination-Participation Restrictions Community Activity;Shop;Meal Prep;Cleaning;Yard Work    [employer]chant [employer] officer Evolving/Moderate complexity   progressive decrease in ability to stand   Rehab Potential Good   for stated goals   PT Frequency 2x / week    PT Duration 6 weeks    PT Treatment/Interventions ADLs/Self Care Home Management;Cryotherapy;Electrical Stimulation;Iontophoresis '4mg'$ /ml Dexamethasone;Moist Heat;Traction;Ultrasound;DME Instruction;Gait training;Stair training;Functional mobility training;Therapeutic activities;Therapeutic exercise;Neuromuscular re-education;Patient/family education;Manual techniques;Passive range of motion;Taping    PT Next Visit Plan Begin aquatic therapy exercise plan with introduction to pool and how to be safe getting in and out of pool. Perform 5x sit to stand and/or 6 minute walk test during next land visit in order access endurance and balance. Manuel therapy to decrease muscle tightness and spasm.     PT Home Exercise Plan Red  Band: seated hip abduction 3x10, seated hip march 3x10, seated knee extension 3x10    Consulted and Agree with Plan of Care Patient             Patient will benefit from skilled therapeutic intervention in order to improve the following deficits and impairments:  Abnormal gait, Decreased endurance, Decreased mobility, Difficulty walking, Increased muscle spasms, Pain, Decreased activity tolerance, Decreased knowledge of precautions, Decreased range of motion, Decreased strength, Increased fascial restricitons  Visit Diagnosis: Chronic bilateral low back pain without sciatica  Cervicalgia  Other abnormalities of gait and mobility     Problem List Patient Active Problem List   Diagnosis Date Noted   Degenerative cervical disc 07/07/2021   Coronary artery disease involving native coronary artery of native heart with unstable angina pectoris ([place])    Coronary artery disease of native artery of native heart with stable angina pectoris ([facility])    Pseudoarthrosis of lumbar spine 01/06/2020   Coronary artery calcification seen on CT scan 01/29/2019   Change in bowel habits 06/12/2018   Primary osteoarthritis of right knee 01/06/2017   S/P knee replacement 01/06/2017   Diplopia 07/10/2013   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

## 2021-07-26 ENCOUNTER — Ambulatory Visit (HOSPITAL_BASED_OUTPATIENT_CLINIC_OR_DEPARTMENT_OTHER): Payer: Medicare Other | Admitting: Physical Therapy

## 2021-07-26 ENCOUNTER — Encounter (HOSPITAL_BASED_OUTPATIENT_CLINIC_OR_DEPARTMENT_OTHER): Payer: Self-pay | Admitting: Physical Therapy

## 2021-07-26 ENCOUNTER — Other Ambulatory Visit: Payer: Self-pay

## 2021-07-26 DIAGNOSIS — R2689 Other abnormalities of gait and mobility: Secondary | ICD-10-CM | POA: Diagnosis not present

## 2021-07-26 DIAGNOSIS — M545 Low back pain, unspecified: Secondary | ICD-10-CM | POA: Diagnosis not present

## 2021-07-26 DIAGNOSIS — G8929 Other chronic pain: Secondary | ICD-10-CM

## 2021-07-26 DIAGNOSIS — M542 Cervicalgia: Secondary | ICD-10-CM

## 2021-07-26 NOTE — Therapy (Signed)
[facility] [address] [place], [place], [address] Phone: [phone]   Fax:  [phone]  Physical Therapy Treatment  Patient Details  Name: [name] MRN: [id number] Date of Birth: 11-30-1946 Referring Provider (PT): Hulan Saas DO   Encounter Date: 07/26/2021   PT End of Session - 07/26/21 1015     Visit Number 3    Number of Visits 12    Date for PT Re-Evaluation 08/30/21    Authorization Type medicare    PT Start Time 1015    PT Stop Time 1055    PT Time Calculation (min) 40 min    Activity Tolerance Patient tolerated treatment well    Behavior During Therapy [facility] for tasks assessed/performed             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   Paroxysmal atrial flutter ([place])     Past Surgical History:  Procedure Laterality Date   ABDOMINAL EXPOSURE N/A 01/06/2020   Procedure: ABDOMINAL EXPOSURE;  Surgeon: Rosetta Posner, MD;  [facility];  Service: Vascular;  Laterality: N/A;   ANTERIOR LUMBAR FUSION N/A 01/06/2020   Procedure: Lumbar Five- Sacral One  Anterior lumbar interbody fusion with infuse;  Surgeon: Kristeen Miss, MD;  Location: [facility];  Service: Neurosurgery;  Laterality: N/A;  Lumbar Five- Sacral One  Anterior lumbar interbody fusion with infuse   CERVICAL FUSION  1996, 1997   2 surgery- skull to C7   COLON RESECTION  2007   COLONOSCOPY W/ POLYPECTOMY     CORONARY ATHERECTOMY N/A 07/01/2021   Procedure: CORONARY ATHERECTOMY;  Surgeon: Burnell Blanks, MD;  Location:  [facility];  Service: Cardiovascular;  Laterality: N/A;   CORONARY STENT INTERVENTION N/A 07/01/2021   Procedure: CORONARY STENT INTERVENTION;  Surgeon: Burnell Blanks, MD;  Location: [facility];  Service: Cardiovascular;  Laterality: N/A;   HEMORRHOID SURGERY N/A 01/13/2016   Procedure: HEMORRHOIDECTOMY AND REMOVAL OF ANAL SKIN TAG;  Surgeon: Coralie Keens, MD;  Location: [facility];  Service: General;  Laterality: N/A;   INTRAVASCULAR PRESSURE WIRE/FFR STUDY N/A 06/18/2021   Procedure: INTRAVASCULAR PRESSURE WIRE/FFR STUDY;  Surgeon: Burnell Blanks, MD;  Location: [facility];  Service: Cardiovascular;  Laterality: N/A;   INTRAVASCULAR ULTRASOUND/IVUS N/A 07/01/2021   Procedure: Intravascular Ultrasound/IVUS;  Surgeon: Burnell Blanks, MD;  Location: [facility];  Service: Cardiovascular;  Laterality: N/A;   REPLACEMENT TOTAL KNEE Left 2005   left   RIGHT/LEFT HEART CATH AND CORONARY ANGIOGRAPHY N/A 06/18/2021   Procedure: RIGHT/LEFT HEART CATH AND CORONARY ANGIOGRAPHY;  Surgeon: Burnell Blanks, MD;  Location: [facility];  Service: Cardiovascular;  Laterality: N/A;   SYNOVIAL CYST EXCISION     lumbar spine   TONSILLECTOMY     TOTAL KNEE ARTHROPLASTY Right 01/06/2017   Procedure: RIGHT TOTAL KNEE ARTHROPLASTY;  Surgeon: Sydnee Cabal, MD;  [facility];  Service: Orthopedics;  Laterality: Right;  Adductior Block  There were no vitals filed for this visit.   Subjective Assessment - 07/26/21 1015     Subjective Pt states he was very fatigued after last session but did not have any increased pain following. He was a little sore but was more tired than anything else.    Pertinent History skull through C7 fusion, 8 surgeries, L3-S1 fusion done twice.    Limitations Sitting;Lifting;Standing;Walking    How long can you sit comfortably? limited must continue to move.    How long can you stand comfortably? limited due to pain.  <5    How long can you walk comfortably? limited 2-3 mins before pain starts. cannot walk due to pain. at most across parking lot.    Diagnostic tests CT of the lumbar spine taken in August 2020 was independently visualized by me.  Patient did unfortunately have an L5-S1 fusion and it appeared that there was loosening of the S1 screws bilaterally right greater than left.  Patient has also had a past medical history for a L3-L5 fusion noted.  Patient did have severe left-sided L2-L3 narrowing with moderate central canal stenosis.  Intra surgical imaging from January 2021 showed an anterior fusion at L5-S1 and interbody fusion that was loose seem to be removed.    Patient Stated Goals get out and walk, 1 mile and back. [place] and [place] walk. wants to improve stregnth of LE in order to ambulate safely especially steps and stairs. (can only go up 3-5 steps with fatigue with hand rail),    Currently in Pain? Yes    Pain Score 5     Pain Location Back    Pain Orientation Lower    Pain Descriptors / Indicators Aching;Constant    Pain Onset More than a month ago    Pain Onset More than a month ago                Pt seen for aquatic therapy today.  Treatment took place in water 3.25-4 ft in depth at the [facility] pool. Temp of water was 91.  Pt entered/exited the pool via stairs (alternating) with supervision and bilat rail.   Warm up: sidestepping, retro walking, 4x each standing flexion stretch at edge of pool 30s 3x Exercises: seated cycling on bench 20x, standing board press and row in half squat 20x, standing board shoulder extension 2x10 , standing hip ABD (fast and slow) 2x10; standing march 20x, STS off bench 2x10, noodle HS and quad stretch 30s 3x, seated hip fig 4 stretch 30s 2x each     Pt requires buoyancy for support and to offload joints with strengthening exercises. Viscosity of the water is needed for resistance of strengthening; water current perturbations  provides challenge to standing balance unsupported, requiring increased core activation.    PT Education - 07/26/21 1305     Education Details anatomy, exercise progression, DOMS expectations, muscle firing, HEP, pool therapy aftercare/recovery    Person(s) Educated Patient    Methods Explanation;Demonstration;Verbal cues;Tactile cues    Comprehension Verbalized understanding;Returned demonstration;Verbal cues required;Tactile cues required              PT Short Term Goals - 07/20/21 1036       PT SHORT TERM GOAL #1   Title Patient will report increase knee extension strength to 4/5 bilaterally.    Baseline 4-/5    Time 3    Period Weeks    Status New    Target Date 08/10/21  PT SHORT TERM GOAL #2   Title Patient will report decrease pain by 30%.    Baseline 5/10 neck, 5/10 lower back    Time 3    Period Weeks    Status New    Target Date 08/10/21      PT SHORT TERM GOAL #3   Title Patient will report increase in 5x sit to stand by 3 seconds    Time 4    Period Weeks    Status New    Target Date 08/17/21               PT Long Term Goals - 07/20/21 1009       PT LONG TERM GOAL #1   Title Patient will improve lower extremity strenght to 5/5 throughout in order to ambulate 10 steps with decreased fatigue.    Baseline 3 steps with fatigue    Time 6    Period Weeks    Status New    Target Date 08/31/21      PT LONG TERM GOAL #2   Title Patient will improve distance by 400 feet in 6 minute walk test in order walk ambulate [place] miles around community park.    Time 6    Period Weeks    Status New    Target Date 08/30/21      PT LONG TERM GOAL #3   Title Patient will be independent with land and aquatic HEP in order to perform safely at home.    Baseline inital HEP    Time 6    Period Weeks    Status New    Target Date 08/30/21                   Plan - 07/26/21 1057     Clinical Impression Statement Pt able to continue with aquatic  therapy exercise at today's session with decreased frequency of standing rest breaks. Due to soreness and fatigue from last session, exercises were not progressed but pt was able to continue with same intensity as before. Pt has improved SLS stability at today's session in deeper water and able to tolerate SL activity without aggravity lumbar stiffness. Pt continues to require VC and TC for upright positioning, hip extension, and glute activation. Pt gave verbal undertsanding to aquatic therapy aftercare and activity recovery to mitigate DOMS. Pt would benefit from continued skilled therapy in order to reach goals and maximize functional lumbopelvic strength and ROM for prevention of further functional decline.    Personal Factors and Comorbidities Fitness;Time since onset of injury/illness/exacerbation;Comorbidity 3+;Past/Current Experience    Comorbidities stent palcement, bilateral knee replacements; complete cervical fusion    Examination-Activity Limitations Squat;Stairs;Stand;Locomotion Level;Transfers    Examination-Participation Restrictions Community Activity;Shop;Meal Prep;Cleaning;Yard Work    [employer] officer Evolving/Moderate complexity   progressive decrease in ability to stand   Rehab Potential Good   for stated goals   PT Frequency 2x / week    PT Duration 6 weeks    PT Treatment/Interventions ADLs/Self Care Home Management;Cryotherapy;Electrical Stimulation;Iontophoresis '4mg'$ /ml Dexamethasone;Moist Heat;Traction;Ultrasound;DME Instruction;Gait training;Stair training;Functional mobility training;Therapeutic activities;Therapeutic exercise;Neuromuscular re-education;Patient/family education;Manual techniques;Passive range of motion;Taping    PT Next Visit Plan Begin aquatic therapy exercise plan with introduction to pool and how to be safe getting in and out of pool. Perform 5x sit to stand and/or 6 minute walk test during next land visit in order access endurance and  balance. Manuel therapy to decrease muscle tightness and spasm.    PT Home Exercise  Plan Red Band: seated hip abduction 3x10, seated hip march 3x10, seated knee extension 3x10    Consulted and Agree with Plan of Care Patient             Patient will benefit from skilled therapeutic intervention in order to improve the following deficits and impairments:  Abnormal gait, Decreased endurance, Decreased mobility, Difficulty walking, Increased muscle spasms, Pain, Decreased activity tolerance, Decreased knowledge of precautions, Decreased range of motion, Decreased strength, Increased fascial restricitons  Visit Diagnosis: Cervicalgia  Chronic bilateral low back pain without sciatica  Other abnormalities of gait and mobility     Problem List Patient Active Problem List   Diagnosis Date Noted   Degenerative cervical disc 07/07/2021   Coronary artery disease involving native coronary artery of native heart with unstable angina pectoris ([place])    Coronary artery disease of native artery of native heart with stable angina pectoris ([place])    Pseudoarthrosis of lumbar spine 01/06/2020   Coronary artery calcification seen on CT scan 01/29/2019   Change in bowel habits 06/12/2018   Primary osteoarthritis of right knee 01/06/2017   S/P knee replacement 01/06/2017   Diplopia 07/10/2013   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

## 2021-07-27 ENCOUNTER — Telehealth (INDEPENDENT_AMBULATORY_CARE_PROVIDER_SITE_OTHER): Payer: Medicare Other | Admitting: Urology

## 2021-07-27 ENCOUNTER — Encounter: Payer: Self-pay | Admitting: Urology

## 2021-07-27 DIAGNOSIS — N401 Enlarged prostate with lower urinary tract symptoms: Secondary | ICD-10-CM

## 2021-07-27 DIAGNOSIS — R300 Dysuria: Secondary | ICD-10-CM | POA: Diagnosis not present

## 2021-07-27 DIAGNOSIS — I2510 Atherosclerotic heart disease of native coronary artery without angina pectoris: Secondary | ICD-10-CM

## 2021-07-27 DIAGNOSIS — N411 Chronic prostatitis: Secondary | ICD-10-CM

## 2021-07-27 DIAGNOSIS — N138 Other obstructive and reflux uropathy: Secondary | ICD-10-CM

## 2021-07-27 NOTE — Progress Notes (Addendum)
07/27/2021 1:54 PM   [name] 1946-11-02 [id number]  Referring provider: [name], MD [place] [employer] [address],  [place] [address]  Patient location: home Physician location: office I connected with  ADI FINNEGAN on 07/27/21 by a video enabled telemedicine application and verified that I am speaking with the correct person using two identifiers.   I discussed the limitations of evaluation and management by telemedicine. The patient expressed understanding and agreed to proceed.    Followup Chronic prostatitis   HPI: [name] is a 75yo here for followup for chronic prostatitis, dysuria and BPH. He denies any worsening LUTS. Very rare dysuria. Urine stream good. He had 3 cardiac stents placed within the month. No recent PSA. No new complaints today.    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   Paroxysmal atrial flutter ([place])     Surgical History: Past Surgical History:  Procedure Laterality Date   ABDOMINAL EXPOSURE N/A 01/06/2020   Procedure: ABDOMINAL EXPOSURE;  Surgeon: Rosetta Posner, MD;  [facility];  Service: Vascular;  Laterality: N/A;   ANTERIOR LUMBAR FUSION N/A 01/06/2020   Procedure: Lumbar Five- Sacral One  Anterior lumbar interbody fusion with infuse;  Surgeon: Kristeen Miss, MD;  Location: [facility];  Service: Neurosurgery;  Laterality: N/A;  Lumbar Five- Sacral One  Anterior lumbar interbody fusion with infuse   CERVICAL FUSION  1996, 1997   2 surgery- skull to C7   COLON RESECTION  2007   COLONOSCOPY W/  POLYPECTOMY     CORONARY ATHERECTOMY N/A 07/01/2021   Procedure: CORONARY ATHERECTOMY;  Surgeon: Burnell Blanks, MD;  Location: [facility];  Service: Cardiovascular;  Laterality: N/A;   CORONARY STENT INTERVENTION N/A 07/01/2021   Procedure: CORONARY STENT INTERVENTION;  Surgeon: Burnell Blanks, MD;  Location: [facility];  Service: Cardiovascular;  Laterality: N/A;   HEMORRHOID SURGERY N/A 01/13/2016   Procedure: HEMORRHOIDECTOMY AND REMOVAL OF ANAL SKIN TAG;  Surgeon: Coralie Keens, MD;  Location: [facility];  Service: General;  Laterality: N/A;   INTRAVASCULAR PRESSURE WIRE/FFR STUDY N/A 06/18/2021   Procedure: INTRAVASCULAR PRESSURE WIRE/FFR STUDY;  Surgeon: Burnell Blanks, MD;  Location: [facility];  Service: Cardiovascular;  Laterality: N/A;   INTRAVASCULAR ULTRASOUND/IVUS N/A 07/01/2021   Procedure: Intravascular Ultrasound/IVUS;  Surgeon: Burnell Blanks, MD;  Location: [facility];  Service: Cardiovascular;  Laterality: N/A;   REPLACEMENT TOTAL KNEE Left 2005   left   RIGHT/LEFT HEART CATH AND CORONARY ANGIOGRAPHY N/A 06/18/2021   Procedure: RIGHT/LEFT HEART CATH AND CORONARY ANGIOGRAPHY;  Surgeon: Burnell Blanks, MD;  Location: [facility];  Service: Cardiovascular;  Laterality: N/A;   SYNOVIAL CYST EXCISION     lumbar spine   TONSILLECTOMY     TOTAL KNEE ARTHROPLASTY Right 01/06/2017   Procedure: RIGHT TOTAL KNEE ARTHROPLASTY;  Surgeon: Sydnee Cabal, MD;  [facility];  Service: Orthopedics;  Laterality: Right;  Adductior Block  Home Medications:  Allergies as of 07/27/2021       Reactions   Alfuzosin Other (See Comments)   Felt winded/faint   Gabapentin Nausea Only   Pregabalin Nausea Only, Other (See Comments)   Felt poorly   Tamsulosin Hcl Other (See Comments)   Orthostatic hypotension   Contrast Media [iodinated Diagnostic Agents] Rash   Cath dye 06/18/21 rash started next day   Niacin Diarrhea         Medication List        Accurate as of July 27, 2021  1:54 PM. If you have any questions, ask your nurse or doctor.          amLODipine 5 MG tablet Commonly known as: NORVASC TAKE 1 TABLET BY MOUTH TWICE A DAY   amoxicillin 500 MG capsule Commonly known as: AMOXIL Take 2,000 mg by mouth See admin instructions. Take 4 capsules (2000 mg) by mouth 1 hour prior to dental appointments   aspirin EC 81 MG tablet Take 81 mg by mouth every evening. Swallow whole.   B-complex with vitamin C tablet Take 1 tablet by mouth daily with lunch.   benazepril 20 MG tablet Commonly known as: LOTENSIN Take 1 tablet (20 mg total) by mouth daily.   clopidogrel 75 MG tablet Commonly known as: PLAVIX Take 600 mg (8 tablets) on Day 1 and then take 75 mg daily.  Start on 06/28/21.   COSAMIN DS PO Take 1 tablet by mouth every evening.   hydrochlorothiazide 12.5 MG capsule Commonly known as: MICROZIDE Take 12.5 mg by mouth daily with lunch.   methocarbamol 500 MG tablet Commonly known as: ROBAXIN Take 500 mg by mouth every 8 (eight) hours as needed for muscle spasms.   metoprolol tartrate 25 MG tablet Commonly known as: LOPRESSOR Take 25 mg by mouth daily as needed (afib.).   multivitamin with minerals Tabs tablet Take 1 tablet by mouth daily with lunch. Adult 50+   nitroGLYCERIN 0.4 MG SL tablet Commonly known as: Nitrostat Place 1 tablet (0.4 mg total) under the tongue every 5 (five) minutes as needed.   oxyCODONE 5 MG immediate release tablet Commonly known as: Roxicodone Take 1-2 tablets (5-10 mg total) by mouth every 4 (four) hours as needed for severe pain or breakthrough pain.   rosuvastatin 5 MG tablet Commonly known as: CRESTOR Patient take 2 tablets by mouth every other day (10 mg total)   scopolamine 1 MG/3DAYS Commonly known as: TRANSDERM-SCOP Place 1 patch onto the skin every three (3) days as needed (nausea).   vitamin C 500 MG tablet Commonly known as:  ASCORBIC ACID Take 500 mg by mouth daily with lunch.   Vitamin D3 50 MCG (2000 UT) Tabs Take 2,000 Units by mouth daily with lunch. B complex, vitamin c 500   Zinc 50 MG Tabs Take 50 mg by mouth every evening.        Allergies:  Allergies  Allergen Reactions   Alfuzosin Other (See Comments)    Felt winded/faint   Gabapentin Nausea Only   Pregabalin Nausea Only and Other (See Comments)    Felt poorly   Tamsulosin Hcl Other (See Comments)    Orthostatic hypotension   Contrast Media [Iodinated Diagnostic Agents] Rash    Cath dye 06/18/21 rash started next day   Niacin Diarrhea    Family History: Family History  Problem Relation Age of Onset   Coronary artery disease Mother    Coronary artery disease Father    Colon cancer  Neg Hx    Stomach cancer Neg Hx     Social History:  reports that he has never smoked. He has never used smokeless tobacco. He reports that he does not drink alcohol and does not use drugs.  ROS: All other review of systems were reviewed and are negative except what is noted above in HPI   Laboratory Data: Lab Results  Component Value Date   WBC 8.1 06/28/2021   HGB 13.3 06/28/2021   HCT 39.8 06/28/2021   MCV 95 06/28/2021   PLT 186 06/28/2021    Lab Results  Component Value Date   CREATININE 1.16 06/28/2021    Lab Results  Component Value Date   PSA 0.5 09/05/2016   PSA 0.49 09/10/2015    No results found for: TESTOSTERONE  Lab Results  Component Value Date   HGBA1C 5.3 09/05/2016    Urinalysis    Component Value Date/Time   COLORURINE AMBER (A) 12/30/2016 1046   APPEARANCEUR Clear 07/27/2020 1119   LABSPEC 1.020 12/30/2016 1046   PHURINE 5.0 12/30/2016 1046   GLUCOSEU Negative 07/27/2020 1119   HGBUR NEGATIVE 12/30/2016 1046   BILIRUBINUR Negative 07/27/2020 1119   KETONESUR NEGATIVE 12/30/2016 1046   PROTEINUR 1+ (A) 07/27/2020 1119   PROTEINUR NEGATIVE 12/30/2016 1046   UROBILINOGEN 0.2 08/31/2007 1221   NITRITE  Negative 07/27/2020 1119   NITRITE NEGATIVE 12/30/2016 1046   LEUKOCYTESUR Negative 07/27/2020 1119    Lab Results  Component Value Date   LABMICR See below: 07/27/2020   WBCUA None seen 07/27/2020   LABEPIT 0-10 07/27/2020   BACTERIA None seen 07/27/2020    Pertinent Imaging:  No results found for this or any previous visit.  No results found for this or any previous visit.  No results found for this or any previous visit.  No results found for this or any previous visit.  No results found for this or any previous visit.  No results found for this or any previous visit.  No results found for this or any previous visit.  No results found for this or any previous visit.   Assessment & Plan:    1. Benign prostatic hyperplasia with urinary obstruction -He has mild LUTS of fluid management. RTC 6 months with PSA and DRE  2. Dysuria -resolved  3. Chronic prostatitis without hematuria -resolved   No follow-ups on file.  Nicolette Bang, MD  [facility] [place]

## 2021-07-27 NOTE — Patient Instructions (Signed)
Benign Prostatic Hyperplasia  Benign prostatic hyperplasia (BPH) is an enlarged prostate gland that is caused by the normal aging process and not by cancer. The prostate is a walnut-sized gland that is involved in the production of semen. It is located in front of the rectum and below the bladder. The bladder stores urine and the urethra is the tube that carries the urine out of the body. The prostate may get bigger asa man gets older. An enlarged prostate can press on the urethra. This can make it harder to pass urine. The build-up of urine in the bladder can cause infection. Back pressure and infection may progress to bladder damage and kidney (renal) failure. What are the causes? This condition is part of a normal aging process. However, not all men develop problems from this condition. If the prostate enlarges away from the urethra, urine flow will not be blocked. If it enlarges toward the urethra andcompresses it, there will be problems passing urine. What increases the risk? This condition is more likely to develop in men over the age of 50 years. What are the signs or symptoms? Symptoms of this condition include: Getting up often during the night to urinate. Needing to urinate frequently during the day. Difficulty starting urine flow. Decrease in size and strength of your urine stream. Leaking (dribbling) after urinating. Inability to pass urine. This needs immediate treatment. Inability to completely empty your bladder. Pain when you pass urine. This is more common if there is also an infection. Urinary tract infection (UTI). How is this diagnosed? This condition is diagnosed based on your medical history, a physical exam, and your symptoms. Tests will also be done, such as: A post-void bladder scan. This measures any amount of urine that may remain in your bladder after you finish urinating. A digital rectal exam. In a rectal exam, your health care provider checks your prostate by  putting a lubricated, gloved finger into your rectum to feel the back of your prostate gland. This exam detects the size of your gland and any abnormal lumps or growths. An exam of your urine (urinalysis). A prostate specific antigen (PSA) screening. This is a blood test used to screen for prostate cancer. An ultrasound. This test uses sound waves to electronically produce a picture of your prostate gland. Your health care provider may refer you to a specialist in kidney and prostate diseases (urologist). How is this treated? Once symptoms begin, your health care provider will monitor your condition (active surveillance or watchful waiting). Treatment for this condition will depend on the severity of your condition. Treatment may include: Observation and yearly exams. This may be the only treatment needed if your condition and symptoms are mild. Medicines to relieve your symptoms, including: Medicines to shrink the prostate. Medicines to relax the muscle of the prostate. Surgery in severe cases. Surgery may include: Prostatectomy. In this procedure, the prostate tissue is removed completely through an open incision or with a laparoscope or robotics. Transurethral resection of the prostate (TURP). In this procedure, a tool is inserted through the opening at the tip of the penis (urethra). It is used to cut away tissue of the inner core of the prostate. The pieces are removed through the same opening of the penis. This removes the blockage. Transurethral incision (TUIP). In this procedure, small cuts are made in the prostate. This lessens the prostate's pressure on the urethra. Transurethral microwave thermotherapy (TUMT). This procedure uses microwaves to create heat. The heat destroys and removes a small   amount of prostate tissue. Transurethral needle ablation (TUNA). This procedure uses radio frequencies to destroy and remove a small amount of prostate tissue. Interstitial laser coagulation (ILC).  This procedure uses a laser to destroy and remove a small amount of prostate tissue. Transurethral electrovaporization (TUVP). This procedure uses electrodes to destroy and remove a small amount of prostate tissue. Prostatic urethral lift. This procedure inserts an implant to push the lobes of the prostate away from the urethra. Follow these instructions at home: Take over-the-counter and prescription medicines only as told by your health care provider. Monitor your symptoms for any changes. Contact your health care provider with any changes. Avoid drinking large amounts of liquid before going to bed or out in public. Avoid or reduce how much caffeine or alcohol you drink. Give yourself time when you urinate. Keep all follow-up visits as told by your health care provider. This is important. Contact a health care provider if: You have unexplained back pain. Your symptoms do not get better with treatment. You develop side effects from the medicine you are taking. Your urine becomes very dark or has a bad smell. Your lower abdomen becomes distended and you have trouble passing your urine. Get help right away if: You have a fever or chills. You suddenly cannot urinate. You feel lightheaded, or very dizzy, or you faint. There are large amounts of blood or clots in the urine. Your urinary problems become hard to manage. You develop moderate to severe low back or flank pain. The flank is the side of your body between the ribs and the hip. These symptoms may represent a serious problem that is an emergency. Do not wait to see if the symptoms will go away. Get medical help right away. Call your local emergency services (911 in the [place] Do not drive yourself to the hospital. Summary Benign prostatic hyperplasia (BPH) is an enlarged prostate that is caused by the normal aging process and not by cancer. An enlarged prostate can press on the urethra. This can make it hard to pass urine. This  condition is part of a normal aging process and is more likely to develop in men over the age of 50 years. Get help right away if you suddenly cannot urinate. This information is not intended to replace advice given to you by your health care provider. Make sure you discuss any questions you have with your healthcare provider. Document Revised: 08/13/2020 Document Reviewed: 08/13/2020 Elsevier Patient Education  2022 Elsevier Inc.  

## 2021-07-29 ENCOUNTER — Other Ambulatory Visit: Payer: Self-pay

## 2021-07-29 ENCOUNTER — Ambulatory Visit (HOSPITAL_BASED_OUTPATIENT_CLINIC_OR_DEPARTMENT_OTHER): Payer: Medicare Other | Admitting: Physical Therapy

## 2021-07-29 ENCOUNTER — Ambulatory Visit: Payer: Medicare Other | Admitting: Urology

## 2021-07-29 ENCOUNTER — Encounter (HOSPITAL_BASED_OUTPATIENT_CLINIC_OR_DEPARTMENT_OTHER): Payer: Self-pay | Admitting: Physical Therapy

## 2021-07-29 DIAGNOSIS — R2689 Other abnormalities of gait and mobility: Secondary | ICD-10-CM

## 2021-07-29 DIAGNOSIS — M542 Cervicalgia: Secondary | ICD-10-CM

## 2021-07-29 DIAGNOSIS — G8929 Other chronic pain: Secondary | ICD-10-CM

## 2021-07-29 DIAGNOSIS — M545 Low back pain, unspecified: Secondary | ICD-10-CM

## 2021-07-29 NOTE — Therapy (Signed)
[facility] [address] [place], [place], [address] Phone: [phone]   Fax:  [phone]  Physical Therapy Treatment  Patient Details  Name: [name] MRN: [id number] Date of Birth: 1946/12/14 Referring Provider (PT): Hulan Saas DO   Encounter Date: 07/29/2021   PT End of Session - 07/29/21 1017     Visit Number 4    Number of Visits 12    Date for PT Re-Evaluation 08/30/21    Authorization Type medicare    PT Start Time 1015    PT Stop Time 1055    PT Time Calculation (min) 40 min    Activity Tolerance Patient tolerated treatment well    Behavior During Therapy [facility] for tasks assessed/performed             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   Paroxysmal atrial flutter ([place])     Past Surgical History:  Procedure Laterality Date   ABDOMINAL EXPOSURE N/A 01/06/2020   Procedure: ABDOMINAL EXPOSURE;  Surgeon: Rosetta Posner, MD;  [facility];  Service: Vascular;  Laterality: N/A;   ANTERIOR LUMBAR FUSION N/A 01/06/2020   Procedure: Lumbar Five- Sacral One  Anterior lumbar interbody fusion with infuse;  Surgeon: Kristeen Miss, MD;  Location: [facility];  Service: Neurosurgery;  Laterality: N/A;  Lumbar Five- Sacral One  Anterior lumbar interbody fusion with infuse   CERVICAL FUSION  1996, 1997   2 surgery- skull to C7   COLON RESECTION  2007   COLONOSCOPY W/ POLYPECTOMY     CORONARY ATHERECTOMY N/A 07/01/2021   Procedure: CORONARY ATHERECTOMY;  Surgeon: Burnell Blanks, MD;   Location: [facility];  Service: Cardiovascular;  Laterality: N/A;   CORONARY STENT INTERVENTION N/A 07/01/2021   Procedure: CORONARY STENT INTERVENTION;  Surgeon: Burnell Blanks, MD;  Location: [facility];  Service: Cardiovascular;  Laterality: N/A;   HEMORRHOID SURGERY N/A 01/13/2016   Procedure: HEMORRHOIDECTOMY AND REMOVAL OF ANAL SKIN TAG;  Surgeon: Coralie Keens, MD;  Location: [facility];  Service: General;  Laterality: N/A;   INTRAVASCULAR PRESSURE WIRE/FFR STUDY N/A 06/18/2021   Procedure: INTRAVASCULAR PRESSURE WIRE/FFR STUDY;  Surgeon: Burnell Blanks, MD;  Location: [facility];  Service: Cardiovascular;  Laterality: N/A;   INTRAVASCULAR ULTRASOUND/IVUS N/A 07/01/2021   Procedure: Intravascular Ultrasound/IVUS;  Surgeon: Burnell Blanks, MD;  Location: [facility];  Service: Cardiovascular;  Laterality: N/A;   REPLACEMENT TOTAL KNEE Left 2005   left   RIGHT/LEFT HEART CATH AND CORONARY ANGIOGRAPHY N/A 06/18/2021   Procedure: RIGHT/LEFT HEART CATH AND CORONARY ANGIOGRAPHY;  Surgeon: Burnell Blanks, MD;  Location: [place] [place] [facility];  Service: Cardiovascular;  Laterality: N/A;   SYNOVIAL CYST EXCISION     lumbar spine   TONSILLECTOMY     TOTAL KNEE ARTHROPLASTY Right 01/06/2017   Procedure: RIGHT TOTAL KNEE ARTHROPLASTY;  Surgeon: Sydnee Cabal, MD;  [facility];  Service: Orthopedics;  Laterality: Right;  Adductior Block  There were no vitals filed for this visit.   Subjective Assessment - 07/29/21 1015     Subjective Pt states he feels the difference in his mobility. He has been increasing his exercises at home and has been doing aquatic exercise at home himself too.    Pertinent History skull through C7 fusion, 8 surgeries, L3-S1 fusion done twice.    Limitations Sitting;Lifting;Standing;Walking    How long can you sit comfortably? limited must continue to move.    How long can you stand comfortably? limited due to  pain. <5    How long can you walk comfortably? limited 2-3 mins before pain starts. cannot walk due to pain. at most across parking lot.    Diagnostic tests CT of the lumbar spine taken in August 2020 was independently visualized by me.  Patient did unfortunately have an L5-S1 fusion and it appeared that there was loosening of the S1 screws bilaterally right greater than left.  Patient has also had a past medical history for a L3-L5 fusion noted.  Patient did have severe left-sided L2-L3 narrowing with moderate central canal stenosis.  Intra surgical imaging from January 2021 showed an anterior fusion at L5-S1 and interbody fusion that was loose seem to be removed.    Patient Stated Goals get out and walk, 1 mile and back. lives near park and wants to walk. wants to improve stregnth of LE in order to ambulate safely especially steps and stairs. (can only go up 3-5 steps with fatigue with hand rail),    Currently in Pain? Yes    Pain Score 4     Pain Location Back    Pain Orientation Lower    Pain Descriptors / Indicators Constant    Pain Onset More than a month ago    Pain Score 5    Pain Location Neck    Pain Orientation Left;Right    Pain Descriptors / Indicators Aching    Pain Onset More than a month ago                         Pt seen for aquatic therapy today.  Treatment took place in water 3.25-4 ft in depth at the [employer] pool. Temp of water was 91.  Pt entered/exited the pool via stairs (alternating) independently with bilat rail.   Warm up: sidestepping, retro walking, 4x each standing flexion stretch at edge of pool 30s 3x Exercises: standing board press and row in half squat 20x, noodle deep end cycling 4x laps, standing march with 1lb ankle weights 20x, standing hip ABD with 1lb ankle weights 2x10, minisquat without UE support 2x10, step up on box fwd and lat 20x, foam DB press and row 20x, SLS at chest cheep 30s 3x each     Pt requires buoyancy for  support and to offload joints with strengthening exercises. Viscosity of the water is needed for resistance of strengthening; water current perturbations provides challenge to standing balance unsupported, requiring increased core activation.                PT Short Term Goals - 07/20/21 1036       PT SHORT TERM GOAL #1   Title Patient will report increase knee extension strength to 4/5 bilaterally.    Baseline 4-/5    Time 3    Period Weeks    Status New    Target Date 08/10/21      PT SHORT TERM GOAL #2  Title Patient will report decrease pain by 30%.    Baseline 5/10 neck, 5/10 lower back    Time 3    Period Weeks    Status New    Target Date 08/10/21      PT SHORT TERM GOAL #3   Title Patient will report increase in 5x sit to stand by 3 seconds    Time 4    Period Weeks    Status New    Target Date 08/17/21               PT Long Term Goals - 07/20/21 1009       PT LONG TERM GOAL #1   Title Patient will improve lower extremity strenght to 5/5 throughout in order to ambulate 10 steps with decreased fatigue.    Baseline 3 steps with fatigue    Time 6    Period Weeks    Status New    Target Date 08/31/21      PT LONG TERM GOAL #2   Title Patient will improve distance by 400 feet in 6 minute walk test in order walk ambulate 2 miles around community park.    Time 6    Period Weeks    Status New    Target Date 08/30/21      PT LONG TERM GOAL #3   Title Patient will be independent with land and aquatic HEP in order to perform safely at home.    Baseline inital HEP    Time 6    Period Weeks    Status New    Target Date 08/30/21                    Patient will benefit from skilled therapeutic intervention in order to improve the following deficits and impairments:     Visit Diagnosis: No diagnosis found.     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

## 2021-07-30 ENCOUNTER — Ambulatory Visit: Payer: Medicare Other | Admitting: Urology

## 2021-08-05 ENCOUNTER — Ambulatory Visit (HOSPITAL_BASED_OUTPATIENT_CLINIC_OR_DEPARTMENT_OTHER): Payer: Medicare Other | Admitting: Physical Therapy

## 2021-08-09 ENCOUNTER — Other Ambulatory Visit: Payer: Self-pay

## 2021-08-09 ENCOUNTER — Ambulatory Visit (HOSPITAL_BASED_OUTPATIENT_CLINIC_OR_DEPARTMENT_OTHER): Payer: Medicare Other | Admitting: Physical Therapy

## 2021-08-09 ENCOUNTER — Encounter (HOSPITAL_BASED_OUTPATIENT_CLINIC_OR_DEPARTMENT_OTHER): Payer: Self-pay | Admitting: Physical Therapy

## 2021-08-09 DIAGNOSIS — M542 Cervicalgia: Secondary | ICD-10-CM

## 2021-08-09 DIAGNOSIS — G8929 Other chronic pain: Secondary | ICD-10-CM | POA: Diagnosis not present

## 2021-08-09 DIAGNOSIS — M545 Low back pain, unspecified: Secondary | ICD-10-CM | POA: Diagnosis not present

## 2021-08-09 DIAGNOSIS — R2689 Other abnormalities of gait and mobility: Secondary | ICD-10-CM | POA: Diagnosis not present

## 2021-08-09 NOTE — Therapy (Signed)
[facility] [address] [place], [place], [address] Phone: [phone]   Fax:  [phone]  Physical Therapy Treatment  Patient Details  Name: [name] MRN: [id number] Date of Birth: 18-Jun-1946 Referring Provider (PT): Hulan Saas DO   Encounter Date: 08/09/2021    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   Paroxysmal atrial flutter ([place])     Past Surgical History:  Procedure Laterality Date   ABDOMINAL EXPOSURE N/A 01/06/2020   Procedure: ABDOMINAL EXPOSURE;  Surgeon: Rosetta Posner, MD;  [facility];  Service: Vascular;  Laterality: N/A;   ANTERIOR LUMBAR FUSION N/A 01/06/2020   Procedure: Lumbar Five- Sacral One  Anterior lumbar interbody fusion with infuse;  Surgeon: Kristeen Miss, MD;  Location: [facility];  Service: Neurosurgery;  Laterality: N/A;  Lumbar Five- Sacral One  Anterior lumbar interbody fusion with infuse   CERVICAL FUSION  1996, 1997   2 surgery- skull to C7   COLON RESECTION  2007   COLONOSCOPY W/ POLYPECTOMY     CORONARY ATHERECTOMY N/A 07/01/2021   Procedure: CORONARY ATHERECTOMY;  Surgeon: Burnell Blanks, MD;  Location: [facility];  Service: Cardiovascular;  Laterality: N/A;   CORONARY STENT INTERVENTION N/A 07/01/2021   Procedure: CORONARY STENT INTERVENTION;  Surgeon: Burnell Blanks, MD;  Location: [facility];  Service: Cardiovascular;  Laterality: N/A;   HEMORRHOID SURGERY N/A 01/13/2016   Procedure: HEMORRHOIDECTOMY AND REMOVAL OF ANAL SKIN TAG;   Surgeon: Coralie Keens, MD;  Location: [facility];  Service: General;  Laterality: N/A;   INTRAVASCULAR PRESSURE WIRE/FFR STUDY N/A 06/18/2021   Procedure: INTRAVASCULAR PRESSURE WIRE/FFR STUDY;  Surgeon: Burnell Blanks, MD;  Location: [facility];  Service: Cardiovascular;  Laterality: N/A;   INTRAVASCULAR ULTRASOUND/IVUS N/A 07/01/2021   Procedure: Intravascular Ultrasound/IVUS;  Surgeon: Burnell Blanks, MD;  Location: [facility];  Service: Cardiovascular;  Laterality: N/A;   REPLACEMENT TOTAL KNEE Left 2005   left   RIGHT/LEFT HEART CATH AND CORONARY ANGIOGRAPHY N/A 06/18/2021   Procedure: RIGHT/LEFT HEART CATH AND CORONARY ANGIOGRAPHY;  Surgeon: Burnell Blanks, MD;  Location: [facility];  Service: Cardiovascular;  Laterality: N/A;   SYNOVIAL CYST EXCISION     lumbar spine   TONSILLECTOMY     TOTAL KNEE ARTHROPLASTY Right 01/06/2017   Procedure: RIGHT TOTAL KNEE ARTHROPLASTY;  Surgeon: Sydnee Cabal, MD;  [facility];  Service: Orthopedics;  Laterality: Right;  Adductior Block    There were no vitals filed for this visit.   Subjective Assessment - 08/09/21 1020     Subjective Pt states that the neck pain is still similar but the back pain might be more aggravated from more walking while on vacation at the beach. He states that walking 200-362f is really strained.    Pertinent History skull through C7 fusion, 8 surgeries, L3-S1 fusion done twice.    Limitations Sitting;Lifting;Standing;Walking    How long can you sit comfortably? limited must continue  to move.    How long can you stand comfortably? limited due to pain. <5    How long can you walk comfortably? limited 2-3 mins before pain starts. cannot walk due to pain. at most across parking lot.    Diagnostic tests CT of the lumbar spine taken in August 2020 was independently visualized by me.  Patient did unfortunately have an L5-S1 fusion and it appeared that there was loosening of the  S1 screws bilaterally right greater than left.  Patient has also had a past medical history for a L3-L5 fusion noted.  Patient did have severe left-sided L2-L3 narrowing with moderate central canal stenosis.  Intra surgical imaging from January 2021 showed an anterior fusion at L5-S1 and interbody fusion that was loose seem to be removed.    Patient Stated Goals get out and walk, 1 mile and back. [place] and wants to walk. wants to improve stregnth of LE in order to ambulate safely especially steps and stairs. (can only go up 3-5 steps with fatigue with hand rail),    Currently in Pain? Yes    Pain Score 4     Pain Location Back    Pain Orientation Lower    Pain Descriptors / Indicators Constant    Pain Onset More than a month ago    Pain Score 4    Pain Location Neck    Pain Orientation Left;Right    Pain Descriptors / Indicators Aching    Pain Onset More than a month ago                          Pt seen for aquatic therapy today.  Treatment took place in water 3.25-4 ft in depth at the [facility] pool. Temp of water was 90.  Pt entered/exited the pool via stairs (alternating) independently with bilat rail.   Warm up: sidestepping, retro walking, 6x each standing flexion stretch at edge of pool 30s 3x, walking 6x laps with 2x DB resistance  Exercises: standing foam DB press and row in half squat 20x, standing march with 1lb ankle weights 20x, standing hip ABD with 1lb ankle weights 2x10, minisquat without UE support 2x10, fwd step up at bottom step 2x10, standing HS and quad noodle stretch 30s 3x     Pt requires buoyancy for support and to offload joints with strengthening exercises. Viscosity of the water is needed for resistance of strengthening; water current perturbations provides challenge to standing balance unsupported, requiring increased core activation.            PT Short Term Goals - 07/20/21 1036       PT SHORT TERM GOAL #1   Title  Patient will report increase knee extension strength to 4/5 bilaterally.    Baseline 4-/5    Time 3    Period Weeks    Status New    Target Date 08/10/21      PT SHORT TERM GOAL #2   Title Patient will report decrease pain by 30%.    Baseline 5/10 neck, 5/10 lower back    Time 3    Period Weeks    Status New    Target Date 08/10/21      PT SHORT TERM GOAL #3   Title Patient will report increase in 5x sit to stand by 3 seconds    Time 4    Period Weeks    Status New    Target Date  08/17/21               PT Long Term Goals - 07/20/21 1009       PT LONG TERM GOAL #1   Title Patient will improve lower extremity strenght to 5/5 throughout in order to ambulate 10 steps with decreased fatigue.    Baseline 3 steps with fatigue    Time 6    Period Weeks    Status New    Target Date 08/31/21      PT LONG TERM GOAL #2   Title Patient will improve distance by 400 feet in 6 minute walk test in order walk ambulate 2 miles around community park.    Time 6    Period Weeks    Status New    Target Date 08/30/21      PT LONG TERM GOAL #3   Title Patient will be independent with land and aquatic HEP in order to perform safely at home.    Baseline inital HEP    Time 6    Period Weeks    Status New    Target Date 08/30/21                    Patient will benefit from skilled therapeutic intervention in order to improve the following deficits and impairments:     Visit Diagnosis: No diagnosis found.     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

## 2021-08-12 ENCOUNTER — Other Ambulatory Visit: Payer: Self-pay

## 2021-08-12 ENCOUNTER — Ambulatory Visit (HOSPITAL_BASED_OUTPATIENT_CLINIC_OR_DEPARTMENT_OTHER): Payer: Medicare Other | Admitting: Physical Therapy

## 2021-08-12 DIAGNOSIS — M542 Cervicalgia: Secondary | ICD-10-CM

## 2021-08-12 DIAGNOSIS — M545 Low back pain, unspecified: Secondary | ICD-10-CM

## 2021-08-12 DIAGNOSIS — G8929 Other chronic pain: Secondary | ICD-10-CM | POA: Diagnosis not present

## 2021-08-12 DIAGNOSIS — R2689 Other abnormalities of gait and mobility: Secondary | ICD-10-CM

## 2021-08-13 ENCOUNTER — Encounter (HOSPITAL_BASED_OUTPATIENT_CLINIC_OR_DEPARTMENT_OTHER): Payer: Self-pay | Admitting: Physical Therapy

## 2021-08-13 NOTE — Therapy (Signed)
[facility] [address] [place], [place], [address] Phone: [phone]   Fax:  [phone]  Physical Therapy Treatment  Patient Details  Name: [name] MRN: [id number] Date of Birth: [birth year] Referring Provider (PT): Hulan Saas DO   Encounter Date: 08/12/2021   PT End of Session - 08/13/21 0932     Visit Number 6    Number of Visits 12    Date for PT Re-Evaluation 08/30/21    Authorization Type medicare    PT Start Time 1000    PT Stop Time 1045    PT Time Calculation (min) 45 min    Activity Tolerance Patient tolerated treatment well    Behavior During Therapy [facility] for tasks assessed/performed             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   Paroxysmal atrial flutter ([place])     Past Surgical History:  Procedure Laterality Date   ABDOMINAL EXPOSURE N/A 01/06/2020   Procedure: ABDOMINAL EXPOSURE;  Surgeon: Rosetta Posner, MD;  [facility];  Service: Vascular;  Laterality: N/A;   ANTERIOR LUMBAR FUSION N/A 01/06/2020   Procedure: Lumbar Five- Sacral One  Anterior lumbar interbody fusion with infuse;  Surgeon: Kristeen Miss, MD;  Location: [facility];  Service: Neurosurgery;  Laterality: N/A;  Lumbar Five- Sacral One  Anterior lumbar interbody fusion with infuse   CERVICAL FUSION  1996, 1997   2 surgery- skull to C7   COLON RESECTION  2007   COLONOSCOPY W/ POLYPECTOMY     CORONARY ATHERECTOMY N/A 07/01/2021   Procedure: CORONARY ATHERECTOMY;  Surgeon: Burnell Blanks, MD;   Location: [facility];  Service: Cardiovascular;  Laterality: N/A;   CORONARY STENT INTERVENTION N/A 07/01/2021   Procedure: CORONARY STENT INTERVENTION;  Surgeon: Burnell Blanks, MD;  Location: [facility];  Service: Cardiovascular;  Laterality: N/A;   HEMORRHOID SURGERY N/A 01/13/2016   Procedure: HEMORRHOIDECTOMY AND REMOVAL OF ANAL SKIN TAG;  Surgeon: Coralie Keens, MD;  Location: [facility];  Service: General;  Laterality: N/A;   INTRAVASCULAR PRESSURE WIRE/FFR STUDY N/A 06/18/2021   Procedure: INTRAVASCULAR PRESSURE WIRE/FFR STUDY;  Surgeon: Burnell Blanks, MD;  Location: [facility];  Service: Cardiovascular;  Laterality: N/A;   INTRAVASCULAR ULTRASOUND/IVUS N/A 07/01/2021   Procedure: Intravascular Ultrasound/IVUS;  Surgeon: Burnell Blanks, MD;  Location: [facility];  Service: Cardiovascular;  Laterality: N/A;   REPLACEMENT TOTAL KNEE Left 2005   left   RIGHT/LEFT HEART CATH AND CORONARY ANGIOGRAPHY N/A 06/18/2021   Procedure: RIGHT/LEFT HEART CATH AND CORONARY ANGIOGRAPHY;  Surgeon: Burnell Blanks, MD;  Location: [facility];  Service: Cardiovascular;  Laterality: N/A;   SYNOVIAL CYST EXCISION     lumbar spine   TONSILLECTOMY     TOTAL KNEE ARTHROPLASTY Right 01/06/2017   Procedure: RIGHT TOTAL KNEE ARTHROPLASTY;  Surgeon: Sydnee Cabal, MD;  [facility];  Service: Orthopedics;  Laterality: Right;  Adductior Block  There were no vitals filed for this visit.   Subjective Assessment - 08/13/21 0929     Subjective Patient reports he is better then when he came back from the beach, but he bent over to pick items up off the ground over the weekend so he is having some pain. he feels like overall he is improving, but he is still having pain when he stands and walks.    Pertinent History skull through C7 fusion, 8 surgeries, L3-S1 fusion done twice.    Limitations Sitting;Lifting;Standing;Walking    How long can you  sit comfortably? limited must continue to move.    How long can you stand comfortably? limited due to pain. <5    How long can you walk comfortably? limited 2-3 mins before pain starts. cannot walk due to pain. at most across parking lot.    Diagnostic tests CT of the lumbar spine taken in August 2020 was independently visualized by me.  Patient did unfortunately have an L5-S1 fusion and it appeared that there was loosening of the S1 screws bilaterally right greater than left.  Patient has also had a past medical history for a L3-L5 fusion noted.  Patient did have severe left-sided L2-L3 narrowing with moderate central canal stenosis.  Intra surgical imaging from January 2021 showed an anterior fusion at L5-S1 and interbody fusion that was loose seem to be removed.    Patient Stated Goals get out and walk, 1 mile and back. [place] and wants to walk. wants to improve stregnth of LE in order to ambulate safely especially steps and stairs. (can only go up 3-5 steps with fatigue with hand rail),    Currently in Pain? Yes    Pain Score 6     Pain Location Back    Pain Orientation Right;Lower    Pain Descriptors / Indicators Constant    Pain Type Chronic pain    Pain Onset Today    Aggravating Factors  walking and bending    Pain Relieving Factors medication and rest    Effect of Pain on Daily Activities can not walk for long periods of time    Multiple Pain Sites No                  Pt seen for aquatic therapy today.  Treatment took place in water 3.25-4 ft in depth at the [facility] pool. Temp of water was 90.  Pt entered/exited the pool via stairs (alternating) independently with bilat rail.   Warm up: sidestepping, retro walking,  Hip opening walk; long strides 4x each standing flexion stretch at edge of pool    Exercises: standing foam DB press and row in half squat 20x, standing march with 2lb ankle weights 20x, standing hip ABD with 2 lb ankle weights 2x10,  minisquat without UE support 2x10, fwd step up at bottom step 2x10,   Seated board stretch x10 fwd x10 side to side 5 sec holds each; board press seated x20;   LAQ 3lb weight x20 each leg        Pt requires buoyancy for support and to offload joints with strengthening exercises. Viscosity of the water is needed for resistance of strengthening; water current perturbations provides challenge to standing balance unsupported, requiring increased core activation.                       PT Education - 08/13/21 0931     Education Details reviewed benefits of water  therapy and POC going forward    Person(s) Educated Patient    Methods Explanation;Demonstration;Tactile cues;Verbal cues    Comprehension Verbalized understanding;Returned demonstration;Verbal cues required;Tactile cues required              PT Short Term Goals - 07/20/21 1036       PT SHORT TERM GOAL #1   Title Patient will report increase knee extension strength to 4/5 bilaterally.    Baseline 4-/5    Time 3    Period Weeks    Status New    Target Date 08/10/21      PT SHORT TERM GOAL #2   Title Patient will report decrease pain by 30%.    Baseline 5/10 neck, 5/10 lower back    Time 3    Period Weeks    Status New    Target Date 08/10/21      PT SHORT TERM GOAL #3   Title Patient will report increase in 5x sit to stand by 3 seconds    Time 4    Period Weeks    Status New    Target Date 08/17/21               PT Long Term Goals - 07/20/21 1009       PT LONG TERM GOAL #1   Title Patient will improve lower extremity strenght to 5/5 throughout in order to ambulate 10 steps with decreased fatigue.    Baseline 3 steps with fatigue    Time 6    Period Weeks    Status New    Target Date 08/31/21      PT LONG TERM GOAL #2   Title Patient will improve distance by 400 feet in 6 minute walk test in order walk ambulate [place] miles around community park.    Time 6    Period Weeks    Status  New    Target Date 08/30/21      PT LONG TERM GOAL #3   Title Patient will be independent with land and aquatic HEP in order to perform safely at home.    Baseline inital HEP    Time 6    Period Weeks    Status New    Target Date 08/30/21                   Plan - 08/13/21 0933     Clinical Impression Statement Patient tolerated treatment well. We focused on dynamic stretching activity in the begining and progressed him into strengthening exercises as the session continued. Therapy put the patient in a float position for abdominal activiation. He tolerated well without neck pain. Therapy will continue to progress as tolerated.    Personal Factors and Comorbidities Fitness;Time since onset of injury/illness/exacerbation;Comorbidity 3+;Past/Current Experience    Comorbidities stent palcement, bilateral knee replacements; complete cervical fusion    Examination-Activity Limitations Squat;Stairs;Stand;Locomotion Level;Transfers    Examination-Participation Restrictions Community Activity;Shop;Meal Prep;Cleaning;Yard Work    [employer] Evolving/Moderate complexity    Clinical Decision Making Moderate    Rehab Potential Good    PT Frequency 2x / week    PT Duration 6 weeks    PT Treatment/Interventions ADLs/Self Care Home Management;Cryotherapy;Electrical Stimulation;Iontophoresis '4mg'$ /ml Dexamethasone;Moist Heat;Traction;Ultrasound;DME Instruction;Gait training;Stair training;Functional mobility training;Therapeutic activities;Therapeutic exercise;Neuromuscular re-education;Patient/family education;Manual techniques;Passive range of motion;Taping    PT Next Visit Plan Perform 5x sit to stand and/or 6 minute walk test during next land visit in order access endurance and balance. Manuel therapy to decrease  muscle tightness and spasm.    PT Home Exercise Plan [place]: seated hip abduction 3x10, seated hip march 3x10, seated knee extension 3x10    Consulted and  Agree with Plan of Care Patient             Patient will benefit from skilled therapeutic intervention in order to improve the following deficits and impairments:  Abnormal gait, Decreased endurance, Decreased mobility, Difficulty walking, Increased muscle spasms, Pain, Decreased activity tolerance, Decreased knowledge of precautions, Decreased range of motion, Decreased strength, Increased fascial restricitons  Visit Diagnosis: Cervicalgia  Chronic bilateral low back pain without sciatica  Other abnormalities of gait and mobility     Problem List Patient Active Problem List   Diagnosis Date Noted   Degenerative cervical disc 07/07/2021   Coronary artery disease involving native coronary artery of native heart with unstable angina pectoris ([place])    Coronary artery disease of native artery of native heart with stable angina pectoris ([place])    Pseudoarthrosis of lumbar spine 01/06/2020   Coronary artery calcification seen on CT scan 01/29/2019   Change in bowel habits 06/12/2018   Primary osteoarthritis of right knee 01/06/2017   S/P knee replacement 01/06/2017   Diplopia 07/10/2013   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

## 2021-08-16 ENCOUNTER — Ambulatory Visit (HOSPITAL_BASED_OUTPATIENT_CLINIC_OR_DEPARTMENT_OTHER): Payer: Medicare Other | Admitting: Physical Therapy

## 2021-08-16 ENCOUNTER — Other Ambulatory Visit: Payer: Self-pay

## 2021-08-16 DIAGNOSIS — M545 Low back pain, unspecified: Secondary | ICD-10-CM

## 2021-08-16 DIAGNOSIS — R2689 Other abnormalities of gait and mobility: Secondary | ICD-10-CM | POA: Diagnosis not present

## 2021-08-16 DIAGNOSIS — M542 Cervicalgia: Secondary | ICD-10-CM

## 2021-08-16 DIAGNOSIS — G8929 Other chronic pain: Secondary | ICD-10-CM | POA: Diagnosis not present

## 2021-08-16 NOTE — Therapy (Signed)
[facility] [address] [place], [place], [address] Phone: [phone]   Fax:  [phone]  Physical Therapy Treatment  Patient Details  Name: [name] MRN: [id number] Date of Birth: [birth year] Referring Provider (PT): Hulan Saas DO   Encounter Date: 08/16/2021   PT End of Session - 08/16/21 1022     Visit Number 7    Number of Visits 12    Date for PT Re-Evaluation 08/30/21    Authorization Type medicare    PT Start Time 1020    PT Stop Time 1100    PT Time Calculation (min) 40 min    Activity Tolerance Patient tolerated treatment well    Behavior During Therapy [facility] for tasks assessed/performed             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   Paroxysmal atrial flutter ([place])     Past Surgical History:  Procedure Laterality Date   ABDOMINAL EXPOSURE N/A 01/06/2020   Procedure: ABDOMINAL EXPOSURE;  Surgeon: Rosetta Posner, MD;  [facility];  Service: Vascular;  Laterality: N/A;   ANTERIOR LUMBAR FUSION N/A 01/06/2020   Procedure: Lumbar Five- Sacral One  Anterior lumbar interbody fusion with infuse;  Surgeon: Kristeen Miss, MD;  Location: [place];  Service: Neurosurgery;  Laterality: N/A;  Lumbar Five- Sacral One  Anterior lumbar interbody fusion with infuse   CERVICAL FUSION  1996, 1997   2 surgery- skull to C7   COLON RESECTION  2007   COLONOSCOPY W/ POLYPECTOMY     CORONARY ATHERECTOMY N/A 07/01/2021   Procedure: CORONARY ATHERECTOMY;  Surgeon: Burnell Blanks, MD;   Location: [place] [facility];  Service: Cardiovascular;  Laterality: N/A;   CORONARY STENT INTERVENTION N/A 07/01/2021   Procedure: CORONARY STENT INTERVENTION;  Surgeon: Burnell Blanks, MD;  Location: [place] [facility];  Service: Cardiovascular;  Laterality: N/A;   HEMORRHOID SURGERY N/A 01/13/2016   Procedure: HEMORRHOIDECTOMY AND REMOVAL OF ANAL SKIN TAG;  Surgeon: Coralie Keens, MD;  Location: [place];  Service: General;  Laterality: N/A;   INTRAVASCULAR PRESSURE WIRE/FFR STUDY N/A 06/18/2021   Procedure: INTRAVASCULAR PRESSURE WIRE/FFR STUDY;  Surgeon: Burnell Blanks, MD;  Location: [place] [facility];  Service: Cardiovascular;  Laterality: N/A;   INTRAVASCULAR ULTRASOUND/IVUS N/A 07/01/2021   Procedure: Intravascular Ultrasound/IVUS;  Surgeon: Burnell Blanks, MD;  Location: [place] [facility];  Service: Cardiovascular;  Laterality: N/A;   REPLACEMENT TOTAL KNEE Left 2005   left   RIGHT/LEFT HEART CATH AND CORONARY ANGIOGRAPHY N/A 06/18/2021   Procedure: RIGHT/LEFT HEART CATH AND CORONARY ANGIOGRAPHY;  Surgeon: Burnell Blanks, MD;  Location: [place] [facility];  Service: Cardiovascular;  Laterality: N/A;   SYNOVIAL CYST EXCISION     lumbar spine   TONSILLECTOMY     TOTAL KNEE ARTHROPLASTY Right 01/06/2017   Procedure: RIGHT TOTAL KNEE ARTHROPLASTY;  Surgeon: Sydnee Cabal, MD;  [facility];  Service: Orthopedics;  Laterality: Right;  Adductior Block  There were no vitals filed for this visit.   Subjective Assessment - 08/16/21 1012     Subjective Pt states he was a little sore after last session. He felt like he "got a workout"    Pertinent History skull through C7 fusion, 8 surgeries, L3-S1 fusion done twice.    Limitations Sitting;Lifting;Standing;Walking    How long can you sit comfortably? limited must continue to move.    How long can you stand comfortably? limited due to pain. <5    How long can you walk comfortably? limited 2-3 mins  before pain starts. cannot walk due to pain. at most across parking lot.    Diagnostic tests CT of the lumbar spine taken in August 2020 was independently visualized by me.  Patient did unfortunately have an L5-S1 fusion and it appeared that there was loosening of the S1 screws bilaterally right greater than left.  Patient has also had a past medical history for a L3-L5 fusion noted.  Patient did have severe left-sided L2-L3 narrowing with moderate central canal stenosis.  Intra surgical imaging from January 2021 showed an anterior fusion at L5-S1 and interbody fusion that was loose seem to be removed.    Patient Stated Goals get out and walk, 1 mile and back. [place] park and wants to walk. wants to improve stregnth of LE in order to ambulate safely especially steps and stairs. (can only go up 3-5 steps with fatigue with hand rail),    Currently in Pain? Yes    Pain Score 5     Pain Location Back    Pain Orientation Right;Lower    Pain Onset Today    Pain Score 3    Pain Location Back    Pain Orientation Left;Right    Pain Descriptors / Indicators Aching                        Pt seen for aquatic therapy today.  Treatment took place in water 3.25-4 ft in depth at the [facility] pool. Temp of water was 90.  Pt entered/exited the pool via stairs (alternating) independently with bilat rail.   Warm up: sidestepping, retro walking, 6x each standing flexion stretch at edge of pool 30s 3x, walking 6x laps with 2x DB resistance   Exercises: standing foam board press and row in half squat 20x, supine leg press (forceful) 10x, standing hip ABD with 1lb ankle weights 2x10, minisquat without UE support 2x10, fwd step up at bottom step 2x10, lateral step up 2x10, split stance squat 2x10     Pt requires buoyancy for support and to offload joints with strengthening exercises. Viscosity of the water is needed for resistance of strengthening; water current perturbations provides  challenge to standing balance unsupported, requiring increased core activation.                 PT Short Term Goals - 07/20/21 1036       PT SHORT TERM GOAL #1   Title Patient will report increase knee extension strength to 4/5 bilaterally.    Baseline 4-/5    Time 3    Period Weeks    Status New    Target Date 08/10/21      PT SHORT TERM GOAL #2   Title Patient will report decrease pain by 30%.    Baseline 5/10 neck, 5/10 lower back    Time 3    Period Weeks    Status New  Target Date 08/10/21      PT SHORT TERM GOAL #3   Title Patient will report increase in 5x sit to stand by 3 seconds    Time 4    Period Weeks    Status New    Target Date 08/17/21               PT Long Term Goals - 07/20/21 1009       PT LONG TERM GOAL #1   Title Patient will improve lower extremity strenght to 5/5 throughout in order to ambulate 10 steps with decreased fatigue.    Baseline 3 steps with fatigue    Time 6    Period Weeks    Status New    Target Date 08/31/21      PT LONG TERM GOAL #2   Title Patient will improve distance by 400 feet in 6 minute walk test in order walk ambulate 2 miles around community park.    Time 6    Period Weeks    Status New    Target Date 08/30/21      PT LONG TERM GOAL #3   Title Patient will be independent with land and aquatic HEP in order to perform safely at home.    Baseline inital HEP    Time 6    Period Weeks    Status New    Target Date 08/30/21                   Plan - 08/16/21 1059     Clinical Impression Statement Pt able to progress aquatic strengthening program today to more single leg activity. Pt without increase in pain but does note increased fatigue with split stance squatting and step ups. Pt's largest reported limitation and observable deficits is postural stability in upright/lumbar extension position. Pt would benefit from continued skilled therapy in order to reach goals and maximize functional  LE lumbopelvic strength for prevention of further functional decline.    Personal Factors and Comorbidities Fitness;Time since onset of injury/illness/exacerbation;Comorbidity 3+;Past/Current Experience    Comorbidities stent palcement, bilateral knee replacements; complete cervical fusion    Examination-Activity Limitations Squat;Stairs;Stand;Locomotion Level;Transfers    Examination-Participation Restrictions Community Activity;Shop;Meal Prep;Cleaning;Yard Work    [employer] Evolving/Moderate complexity    Rehab Potential Good    PT Frequency 2x / week    PT Duration 6 weeks    PT Treatment/Interventions ADLs/Self Care Home Management;Cryotherapy;Electrical Stimulation;Iontophoresis '4mg'$ /ml Dexamethasone;Moist Heat;Traction;Ultrasound;DME Instruction;Gait training;Stair training;Functional mobility training;Therapeutic activities;Therapeutic exercise;Neuromuscular re-education;Patient/family education;Manual techniques;Passive range of motion;Taping    PT Next Visit Plan Perform 5x sit to stand and/or 6 minute walk test during next land visit in order access endurance and balance. Manuel therapy to decrease muscle tightness and spasm.    PT Home Exercise Plan [place]: seated hip abduction 3x10, seated hip march 3x10, seated knee extension 3x10    Consulted and Agree with Plan of Care Patient             Patient will benefit from skilled therapeutic intervention in order to improve the following deficits and impairments:  Abnormal gait, Decreased endurance, Decreased mobility, Difficulty walking, Increased muscle spasms, Pain, Decreased activity tolerance, Decreased knowledge of precautions, Decreased range of motion, Decreased strength, Increased fascial restricitons  Visit Diagnosis: Cervicalgia  Chronic bilateral low back pain without sciatica  Other abnormalities of gait and mobility     Problem List Patient Active Problem List   Diagnosis Date Noted    Degenerative cervical  disc 07/07/2021   Coronary artery disease involving native coronary artery of native heart with unstable angina pectoris [facility])    Coronary artery disease of native artery of native heart with stable angina pectoris (HCC)    Pseudoarthrosis of lumbar spine 01/06/2020   Coronary artery calcification seen on CT scan 01/29/2019   Change in bowel habits 06/12/2018   Primary osteoarthritis of right knee 01/06/2017   S/P knee replacement 01/06/2017   Diplopia 07/10/2013   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

## 2021-08-19 ENCOUNTER — Ambulatory Visit (HOSPITAL_BASED_OUTPATIENT_CLINIC_OR_DEPARTMENT_OTHER): Payer: Medicare Other | Attending: Family Medicine | Admitting: Physical Therapy

## 2021-08-19 ENCOUNTER — Other Ambulatory Visit: Payer: Self-pay

## 2021-08-19 ENCOUNTER — Encounter (HOSPITAL_BASED_OUTPATIENT_CLINIC_OR_DEPARTMENT_OTHER): Payer: Self-pay | Admitting: Physical Therapy

## 2021-08-19 DIAGNOSIS — R2689 Other abnormalities of gait and mobility: Secondary | ICD-10-CM | POA: Diagnosis not present

## 2021-08-19 DIAGNOSIS — M545 Low back pain, unspecified: Secondary | ICD-10-CM | POA: Diagnosis not present

## 2021-08-19 DIAGNOSIS — M542 Cervicalgia: Secondary | ICD-10-CM | POA: Diagnosis not present

## 2021-08-19 DIAGNOSIS — G8929 Other chronic pain: Secondary | ICD-10-CM | POA: Diagnosis not present

## 2021-08-20 ENCOUNTER — Encounter (HOSPITAL_BASED_OUTPATIENT_CLINIC_OR_DEPARTMENT_OTHER): Payer: Self-pay | Admitting: Physical Therapy

## 2021-08-20 NOTE — Therapy (Signed)
[facility] [address] [place], [place], [address] Phone: [phone]   Fax:  [phone]  Physical Therapy Treatment  Patient Details  Name: [name] MRN: [id number] Date of Birth: 1946-09-15 Referring Provider (PT): Hulan Saas DO   Encounter Date: 08/19/2021   PT End of Session - 08/19/21 1023     Visit Number 8    Number of Visits 12    Date for PT Re-Evaluation 08/30/21    Authorization Type medicare    PT Start Time [id number]    PT Stop Time 1058    PT Time Calculation (min) 41 min             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   Paroxysmal atrial flutter ([place])     Past Surgical History:  Procedure Laterality Date   ABDOMINAL EXPOSURE N/A 01/06/2020   Procedure: ABDOMINAL EXPOSURE;  Surgeon: Rosetta Posner, MD;  [facility];  Service: Vascular;  Laterality: N/A;   ANTERIOR LUMBAR FUSION N/A 01/06/2020   Procedure: Lumbar Five- Sacral One  Anterior lumbar interbody fusion with infuse;  Surgeon: Kristeen Miss, MD;  Location: [facility];  Service: Neurosurgery;  Laterality: N/A;  Lumbar Five- Sacral One  Anterior lumbar interbody fusion with infuse   CERVICAL FUSION  1996, 1997   2 surgery- skull to C7   COLON RESECTION  2007   COLONOSCOPY W/ POLYPECTOMY     CORONARY ATHERECTOMY N/A 07/01/2021   Procedure: CORONARY ATHERECTOMY;  Surgeon: Burnell Blanks, MD;  Location: [place] [facility];  Service: Cardiovascular;  Laterality: N/A;   CORONARY STENT INTERVENTION N/A 07/01/2021    Procedure: CORONARY STENT INTERVENTION;  Surgeon: Burnell Blanks, MD;  Location: [place] [facility];  Service: Cardiovascular;  Laterality: N/A;   HEMORRHOID SURGERY N/A 01/13/2016   Procedure: HEMORRHOIDECTOMY AND REMOVAL OF ANAL SKIN TAG;  Surgeon: Coralie Keens, MD;  Location: [facility];  Service: General;  Laterality: N/A;   INTRAVASCULAR PRESSURE WIRE/FFR STUDY N/A 06/18/2021   Procedure: INTRAVASCULAR PRESSURE WIRE/FFR STUDY;  Surgeon: Burnell Blanks, MD;  Location: [facility] [facility];  Service: Cardiovascular;  Laterality: N/A;   INTRAVASCULAR ULTRASOUND/IVUS N/A 07/01/2021   Procedure: Intravascular Ultrasound/IVUS;  Surgeon: Burnell Blanks, MD;  Location: [place] [facility];  Service: Cardiovascular;  Laterality: N/A;   REPLACEMENT TOTAL KNEE Left 2005   left   RIGHT/LEFT HEART CATH AND CORONARY ANGIOGRAPHY N/A 06/18/2021   Procedure: RIGHT/LEFT HEART CATH AND CORONARY ANGIOGRAPHY;  Surgeon: Burnell Blanks, MD;  Location: [place] [facility];  Service: Cardiovascular;  Laterality: N/A;   SYNOVIAL CYST EXCISION     lumbar spine   TONSILLECTOMY     TOTAL KNEE ARTHROPLASTY Right 01/06/2017   Procedure: RIGHT TOTAL KNEE ARTHROPLASTY;  Surgeon: Sydnee Cabal, MD;  [facility];  Service: Orthopedics;  Laterality: Right;  Adductior Block    There were no vitals filed for this visit.   Subjective Assessment - 08/19/21 1022  Subjective Patient was a little sore after the last visit. Ny the second day he was better. He has taken some extra oxycodoen over the past few days because of his neck.    Pertinent History skull through C7 fusion, 8 surgeries, L3-S1 fusion done twice.    Limitations Sitting;Lifting;Standing;Walking    How long can you sit comfortably? limited must continue to move.    How long can you stand comfortably? limited due to pain. <5    How long can you walk comfortably? limited 2-3 mins before pain starts. cannot walk due to pain. at  most across parking lot.    Diagnostic tests CT of the lumbar spine taken in August 2020 was independently visualized by me.  Patient did unfortunately have an L5-S1 fusion and it appeared that there was loosening of the S1 screws bilaterally right greater than left.  Patient has also had a past medical history for a L3-L5 fusion noted.  Patient did have severe left-sided L2-L3 narrowing with moderate central canal stenosis.  Intra surgical imaging from January 2021 showed an anterior fusion at L5-S1 and interbody fusion that was loose seem to be removed.    Patient Stated Goals get out and walk, 1 mile and back. [place] and [place] to walk. wants to improve stregnth of LE in order to ambulate safely especially steps and stairs. (can only go up 3-5 steps with fatigue with hand rail),    Currently in Pain? Yes    Pain Score 5     Pain Location Back    Pain Orientation Right;Lower    Pain Descriptors / Indicators Aching;Constant    Pain Type Chronic pain    Pain Onset Today    Pain Frequency Constant    Aggravating Factors  walking and bending    Pain Relieving Factors medication and rest    Effect of Pain on Daily Activities difficulty walking for long periods of time    Multiple Pain Sites No                    Pt seen for aquatic therapy today.  Treatment took place in water 3.25-4 ft in depth at the [facility] pool. Temp of water was 90.  Pt entered/exited the pool via stairs (alternating) independently with bilat rail.   Warm up: sidestepping, retro walking,  Hip opening walk; long strides 4x each standing flexion stretch at edge of pool    Exercises: standing foam DB press and row in half squat 20x, standing march with 2lb ankle weights 20x, standing hip ABD with 2 lb ankle weights 2x10, minisquat without UE support 2x10, fwd step up at bottom step 2x10,    Seated board stretch x10 fwd x10 side to side 5 sec holds each; board press seated x20;    LAQ 3lb  weight x20 each leg    Step up 2x10 each leg and lateral step up        Pt requires buoyancy for support and to offload joints with strengthening exercises. Viscosity of the water is needed for resistance of strengthening; water current perturbations provides challenge to standing balance unsupported, requiring increased core activation.                     PT Education - 08/20/21 0944     Education Details reveiwed exercises in the pool    Person(s) Educated Patient    Methods Demonstration;Explanation;Tactile cues;Verbal cues    Comprehension Verbalized understanding;Returned  demonstration;Verbal cues required;Tactile cues required              PT Short Term Goals - 07/20/21 1036       PT SHORT TERM GOAL #1   Title Patient will report increase knee extension strength to 4/5 bilaterally.    Baseline 4-/5    Time 3    Period Weeks    Status New    Target Date 08/10/21      PT SHORT TERM GOAL #2   Title Patient will report decrease pain by 30%.    Baseline 5/10 neck, 5/10 lower back    Time 3    Period Weeks    Status New    Target Date 08/10/21      PT SHORT TERM GOAL #3   Title Patient will report increase in 5x sit to stand by 3 seconds    Time 4    Period Weeks    Status New    Target Date 08/17/21               PT Long Term Goals - 07/20/21 1009       PT LONG TERM GOAL #1   Title Patient will improve lower extremity strenght to 5/5 throughout in order to ambulate 10 steps with decreased fatigue.    Baseline 3 steps with fatigue    Time 6    Period Weeks    Status New    Target Date 08/31/21      PT LONG TERM GOAL #2   Title Patient will improve distance by 400 feet in 6 minute walk test in order walk ambulate [place] miles around community park.    Time 6    Period Weeks    Status New    Target Date 08/30/21      PT LONG TERM GOAL #3   Title Patient will be independent with land and aquatic HEP in order to perform safely at home.     Baseline inital HEP    Time 6    Period Weeks    Status New    Target Date 08/30/21                   Plan - 08/20/21 0944     Clinical Impression Statement Patient continues to tolerate awautic exercises well. Therapy addedweights today without a significant increasee in pain. He also worked on steps. Therapy reviewed stretching with the patient as well. he reports he feels like he is having improved ability to perform his ADL's. We will re-assess next week.    Personal Factors and Comorbidities Fitness;Time since onset of injury/illness/exacerbation;Comorbidity 3+;Past/Current Experience    Comorbidities stent palcement, bilateral knee replacements; complete cervical fusion    Examination-Activity Limitations Squat;Stairs;Stand;Locomotion Level;Transfers    Examination-Participation Restrictions Community Activity;Shop;Meal Prep;Cleaning;Yard Work             Patient will benefit from skilled therapeutic intervention in order to improve the following deficits and impairments:  Abnormal gait, Decreased endurance, Decreased mobility, Difficulty walking, Increased muscle spasms, Pain, Decreased activity tolerance, Decreased knowledge of precautions, Decreased range of motion, Decreased strength, Increased fascial restricitons  Visit Diagnosis: No diagnosis found.     Problem List Patient Active Problem List   Diagnosis Date Noted   Degenerative cervical disc 07/07/2021   Coronary artery disease involving native coronary artery of native heart with unstable angina pectoris [facility])    Coronary artery disease of native artery of native heart with stable angina  pectoris ([place])    Pseudoarthrosis of lumbar spine 01/06/2020   Coronary artery calcification seen on CT scan 01/29/2019   Change in bowel habits 06/12/2018   Primary osteoarthritis of right knee 01/06/2017   S/P knee replacement 01/06/2017   Diplopia 07/10/2013   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 [place], [place], [address] Phone: [phone]   Fax:  [phone]  Name: [name] MRN: [id number] Date of Birth: 20-Nov-1946

## 2021-08-24 ENCOUNTER — Other Ambulatory Visit: Payer: Self-pay

## 2021-08-24 ENCOUNTER — Ambulatory Visit (HOSPITAL_BASED_OUTPATIENT_CLINIC_OR_DEPARTMENT_OTHER): Payer: Medicare Other | Admitting: Physical Therapy

## 2021-08-24 ENCOUNTER — Encounter (HOSPITAL_BASED_OUTPATIENT_CLINIC_OR_DEPARTMENT_OTHER): Payer: Self-pay | Admitting: Physical Therapy

## 2021-08-24 DIAGNOSIS — R2689 Other abnormalities of gait and mobility: Secondary | ICD-10-CM | POA: Diagnosis not present

## 2021-08-24 DIAGNOSIS — M545 Low back pain, unspecified: Secondary | ICD-10-CM | POA: Diagnosis not present

## 2021-08-24 DIAGNOSIS — M542 Cervicalgia: Secondary | ICD-10-CM | POA: Diagnosis not present

## 2021-08-24 DIAGNOSIS — G8929 Other chronic pain: Secondary | ICD-10-CM

## 2021-08-24 NOTE — Therapy (Signed)
[facility] [address] [place], [place], [address] Phone: [phone]   Fax:  [phone]  Physical Therapy Treatment  Patient Details  Name: [name] MRN: [id number] Date of Birth: August 29, 1946 Referring Provider (PT): Hulan Saas DO   Encounter Date: 08/24/2021   PT End of Session - 08/24/21 2033     Visit Number 9    Number of Visits 12    Date for PT Re-Evaluation 08/30/21    Authorization Type medicare    PT Start Time [id number]    PT Stop Time 1227    PT Time Calculation (min) 42 min    Activity Tolerance Patient tolerated treatment well    Behavior During Therapy [facility] for tasks assessed/performed             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   Paroxysmal atrial flutter ([place])     Past Surgical History:  Procedure Laterality Date   ABDOMINAL EXPOSURE N/A 01/06/2020   Procedure: ABDOMINAL EXPOSURE;  Surgeon: Rosetta Posner, MD;  [facility];  Service: Vascular;  Laterality: N/A;   ANTERIOR LUMBAR FUSION N/A 01/06/2020   Procedure: Lumbar Five- Sacral One  Anterior lumbar interbody fusion with infuse;  Surgeon: Kristeen Miss, MD;  Location: [place];  Service: Neurosurgery;  Laterality: N/A;  Lumbar Five- Sacral One  Anterior lumbar interbody fusion with infuse   CERVICAL FUSION  1996, 1997   2 surgery- skull to C7   COLON RESECTION  2007   COLONOSCOPY W/ POLYPECTOMY     CORONARY ATHERECTOMY N/A 07/01/2021   Procedure: CORONARY ATHERECTOMY;  Surgeon: Burnell Blanks, MD;  Location:  [place] [facility];  Service: Cardiovascular;  Laterality: N/A;   CORONARY STENT INTERVENTION N/A 07/01/2021   Procedure: CORONARY STENT INTERVENTION;  Surgeon: Burnell Blanks, MD;  Location: [place] [facility];  Service: Cardiovascular;  Laterality: N/A;   HEMORRHOID SURGERY N/A 01/13/2016   Procedure: HEMORRHOIDECTOMY AND REMOVAL OF ANAL SKIN TAG;  Surgeon: Coralie Keens, MD;  Location: [place];  Service: General;  Laterality: N/A;   INTRAVASCULAR PRESSURE WIRE/FFR STUDY N/A 06/18/2021   Procedure: INTRAVASCULAR PRESSURE WIRE/FFR STUDY;  Surgeon: Burnell Blanks, MD;  Location: [place] [facility];  Service: Cardiovascular;  Laterality: N/A;   INTRAVASCULAR ULTRASOUND/IVUS N/A 07/01/2021   Procedure: Intravascular Ultrasound/IVUS;  Surgeon: Burnell Blanks, MD;  Location: [place] [facility];  Service: Cardiovascular;  Laterality: N/A;   REPLACEMENT TOTAL KNEE Left 2005   left   RIGHT/LEFT HEART CATH AND CORONARY ANGIOGRAPHY N/A 06/18/2021   Procedure: RIGHT/LEFT HEART CATH AND CORONARY ANGIOGRAPHY;  Surgeon: Burnell Blanks, MD;  Location: [place] [facility];  Service: Cardiovascular;  Laterality: N/A;   SYNOVIAL CYST EXCISION     lumbar spine   TONSILLECTOMY     TOTAL KNEE ARTHROPLASTY Right 01/06/2017   Procedure: RIGHT TOTAL KNEE ARTHROPLASTY;  Surgeon: Sydnee Cabal, MD;  [facility];  Service: Orthopedics;  Laterality: Right;  Adductior Block  There were no vitals filed for this visit.   Subjective Assessment - 08/24/21 2013     Subjective Patient reports over the weekend he had an incedent of pain into his left gluteal and left side of his lower back. The pain only lasted 2 days but it was severe. He reports he is better today. He reports his neck pain comes and goes but it is always there.    Pertinent History skull through C7 fusion, 8 surgeries, L3-S1 fusion done twice.    Limitations Sitting;Lifting;Standing;Walking    How long can you sit  comfortably? limited must continue to move.    How long can you stand comfortably? limited due to pain. <5    How long can you walk comfortably? limited 2-3 mins before pain starts. cannot walk due to pain. at most across parking lot.    Diagnostic tests CT of the lumbar spine taken in August 2020 was independently visualized by me.  Patient did unfortunately have an L5-S1 fusion and it appeared that there was loosening of the S1 screws bilaterally right greater than left.  Patient has also had a past medical history for a L3-L5 fusion noted.  Patient did have severe left-sided L2-L3 narrowing with moderate central canal stenosis.  Intra surgical imaging from January 2021 showed an anterior fusion at L5-S1 and interbody fusion that was loose seem to be removed.    Patient Stated Goals get out and walk, 1 mile and back. [place] park and wants to walk. wants to improve stregnth of LE in order to ambulate safely especially steps and stairs. (can only go up 3-5 steps with fatigue with hand rail),    Currently in Pain? Yes    Pain Score 5     Pain Location Back    Pain Orientation Right;Lower    Pain Descriptors / Indicators Aching    Pain Type Chronic pain    Pain Onset Today    Pain Frequency Constant    Aggravating Factors  walking and bending    Pain Relieving Factors medication and rest    Effect of Pain on Daily Activities difficulty walking for long periods of time    Multiple Pain Sites Yes    Pain Score 5    Pain Location Back    Pain Orientation Right;Left    Pain Descriptors / Indicators Aching    Pain Type Chronic pain    Pain Onset More than a month ago    Pain Frequency Constant    Aggravating Factors  standing and walking    Pain Relieving Factors medication    Effect of Pain on Daily Activities diffiuclty walking distances                     Pt seen for aquatic therapy today.  Treatment took place in water 3.25-4 ft in depth at the [facility] pool.  Temp of water was 90.  Pt entered/exited the pool via stairs (alternating) independently with bilat rail.   Warm up: sidestepping, retro walking,  Hip opening walk; long strides 4x each standing flexion stretch at edge of pool    Exercises: standing foam DB press and row in half squat 20x, standing march with 2lb ankle weights 20x, standing hip ABD with 2 lb ankle weights 2x10, minisquat without UE support 2x10, fwd step up at bottom step 2x10,    Seated board stretch x10 fwd x10 side to side 5 sec holds each; board press seated x20;  LAQ 3lb weight x20 each leg    Step up 2x10 each leg and lateral step up        Pt requires buoyancy for support and to offload joints with strengthening exercises. Viscosity of the water is needed for resistance of strengthening; water current perturbations provides challenge to standing balance unsupported, requiring increased core activation.                Upper Extremity Functional Index Score :   /80   PT Education - 08/24/21 2023     Education Details techiniqe with pool exercises    Person(s) Educated Patient    Methods Explanation;Demonstration;Tactile cues;Verbal cues    Comprehension Verbalized understanding;Returned demonstration;Verbal cues required;Tactile cues required              PT Short Term Goals - 07/20/21 1036       PT SHORT TERM GOAL #1   Title Patient will report increase knee extension strength to 4/5 bilaterally.    Baseline 4-/5    Time 3    Period Weeks    Status New    Target Date 08/10/21      PT SHORT TERM GOAL #2   Title Patient will report decrease pain by 30%.    Baseline 5/10 neck, 5/10 lower back    Time 3    Period Weeks    Status New    Target Date 08/10/21      PT SHORT TERM GOAL #3   Title Patient will report increase in 5x sit to stand by 3 seconds    Time 4    Period Weeks    Status New    Target Date 08/17/21               PT Long Term Goals - 07/20/21 1009        PT LONG TERM GOAL #1   Title Patient will improve lower extremity strenght to 5/5 throughout in order to ambulate 10 steps with decreased fatigue.    Baseline 3 steps with fatigue    Time 6    Period Weeks    Status New    Target Date 08/31/21      PT LONG TERM GOAL #2   Title Patient will improve distance by 400 feet in 6 minute walk test in order walk ambulate 2 miles around community park.    Time 6    Period Weeks    Status New    Target Date 08/30/21      PT LONG TERM GOAL #3   Title Patient will be independent with land and aquatic HEP in order to perform safely at home.    Baseline inital HEP    Time 6    Period Weeks    Status New    Target Date 08/30/21                   Plan - 08/24/21 2034     Clinical Impression Statement Patient continues to tolerate pool therapy well He reported no increase in pain. He reports he can feel the bicycle the most. heray continues to progress core strengthening and hip strengthening as tolerated.    Personal Factors and Comorbidities Fitness;Time since onset of injury/illness/exacerbation;Comorbidity 3+;Past/Current Experience    Comorbidities stent palcement, bilateral knee replacements; complete cervical fusion    Examination-Activity Limitations Squat;Stairs;Stand;Locomotion Level;Transfers    Examination-Participation Restrictions Community Activity;Shop;Meal Prep;Cleaning;Yard Work    [employer]chant [employer] officer Evolving/Moderate complexity  Clinical Decision Making Moderate    Rehab Potential Good    PT Frequency 2x / week    PT Treatment/Interventions ADLs/Self Care Home Management;Cryotherapy;Electrical Stimulation;Iontophoresis '4mg'$ /ml Dexamethasone;Moist Heat;Traction;Ultrasound;DME Instruction;Gait training;Stair training;Functional mobility training;Therapeutic activities;Therapeutic exercise;Neuromuscular re-education;Patient/family education;Manual techniques;Passive range of motion;Taping    PT Next  Visit Plan Perform 5x sit to stand and/or 6 minute walk test during next land visit in order access endurance and balance. Manuel therapy to decrease muscle tightness and spasm.    PT Home Exercise Plan Red [place]: seated hip abduction 3x10, seated hip march 3x10, seated knee extension 3x10    Consulted and Agree with Plan of Care Patient             Patient will benefit from skilled therapeutic intervention in order to improve the following deficits and impairments:  Abnormal gait, Decreased endurance, Decreased mobility, Difficulty walking, Increased muscle spasms, Pain, Decreased activity tolerance, Decreased knowledge of precautions, Decreased range of motion, Decreased strength, Increased fascial restricitons  Visit Diagnosis: Cervicalgia  Chronic bilateral low back pain without sciatica  Other abnormalities of gait and mobility     Problem List Patient Active Problem List   Diagnosis Date Noted   Degenerative cervical disc 07/07/2021   Coronary artery disease involving native coronary artery of native heart with unstable angina pectoris ([place])    Coronary artery disease of native artery of native heart with stable angina pectoris ([facility])    Pseudoarthrosis of lumbar spine 01/06/2020   Coronary artery calcification seen on CT scan 01/29/2019   Change in bowel habits 06/12/2018   Primary osteoarthritis of right knee 01/06/2017   S/P knee replacement 01/06/2017   Diplopia 07/10/2013   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 [place], [place], [address] Phone: [phone]   Fax:  [phone]  Name: [name] MRN: [id number] Date of Birth: 01/12/1946

## 2021-08-26 ENCOUNTER — Ambulatory Visit (HOSPITAL_BASED_OUTPATIENT_CLINIC_OR_DEPARTMENT_OTHER): Payer: Medicare Other | Admitting: Physical Therapy

## 2021-08-26 ENCOUNTER — Encounter (HOSPITAL_BASED_OUTPATIENT_CLINIC_OR_DEPARTMENT_OTHER): Payer: Self-pay | Admitting: Physical Therapy

## 2021-08-26 ENCOUNTER — Other Ambulatory Visit: Payer: Self-pay

## 2021-08-26 DIAGNOSIS — M545 Low back pain, unspecified: Secondary | ICD-10-CM

## 2021-08-26 DIAGNOSIS — M542 Cervicalgia: Secondary | ICD-10-CM | POA: Diagnosis not present

## 2021-08-26 DIAGNOSIS — G8929 Other chronic pain: Secondary | ICD-10-CM | POA: Diagnosis not present

## 2021-08-26 DIAGNOSIS — R2689 Other abnormalities of gait and mobility: Secondary | ICD-10-CM

## 2021-08-26 NOTE — Therapy (Addendum)
[facility] [place], [place], [address] Phone: [phone]   Fax:  [phone]  Physical Therapy Progress Note  Progress Note Reporting Period 07/19/2021 to 08/26/2021  See note below for Objective Data and Assessment of Progress/Goals.       Patient Details  Name: [name] MRN: [id number] Date of Birth: February 14, 1946 Referring Provider (PT): Hulan Saas DO   Encounter Date: 08/26/2021   PT End of Session - 08/26/21 1243     Visit Number 10    Number of Visits 17    Date for PT Re-Evaluation 11/30/21    Authorization Type medicare    PT Start Time 0930    PT Stop Time 5638    PT Time Calculation (min) 45 min    Activity Tolerance Patient tolerated treatment well    Behavior During Therapy [facility] for tasks assessed/performed             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   Paroxysmal atrial flutter ([place])     Past Surgical History:  Procedure Laterality Date   ABDOMINAL EXPOSURE N/A 01/06/2020   Procedure: ABDOMINAL EXPOSURE;  Surgeon: Rosetta Posner, MD;  [facility];  Service: Vascular;  Laterality: N/A;   ANTERIOR LUMBAR FUSION N/A 01/06/2020   Procedure: Lumbar Five- Sacral One  Anterior lumbar interbody fusion with infuse;  Surgeon: Kristeen Miss, MD;  Location: [place];  Service: Neurosurgery;  Laterality: N/A;  Lumbar Five- Sacral One  Anterior lumbar interbody fusion with infuse   CERVICAL FUSION  1996, 1997   2 surgery- skull to C7   COLON RESECTION  2007    COLONOSCOPY W/ POLYPECTOMY     CORONARY ATHERECTOMY N/A 07/01/2021   Procedure: CORONARY ATHERECTOMY;  Surgeon: Burnell Blanks, MD;  Location: [place] [facility];  Service: Cardiovascular;  Laterality: N/A;   CORONARY STENT INTERVENTION N/A 07/01/2021   Procedure: CORONARY STENT INTERVENTION;  Surgeon: Burnell Blanks, MD;  Location: [place] [facility];  Service: Cardiovascular;  Laterality: N/A;   HEMORRHOID SURGERY N/A 01/13/2016   Procedure: HEMORRHOIDECTOMY AND REMOVAL OF ANAL SKIN TAG;  Surgeon: Coralie Keens, MD;  Location: [place];  Service: General;  Laterality: N/A;   INTRAVASCULAR PRESSURE WIRE/FFR STUDY N/A 06/18/2021   Procedure: INTRAVASCULAR PRESSURE WIRE/FFR STUDY;  Surgeon: Burnell Blanks, MD;  Location: [place] [facility];  Service: Cardiovascular;  Laterality: N/A;   INTRAVASCULAR ULTRASOUND/IVUS N/A 07/01/2021   Procedure: Intravascular Ultrasound/IVUS;  Surgeon: Burnell Blanks, MD;  Location: [place] [facility];  Service: Cardiovascular;  Laterality: N/A;   REPLACEMENT TOTAL KNEE Left 2005   left   RIGHT/LEFT HEART CATH AND CORONARY ANGIOGRAPHY N/A 06/18/2021   Procedure: RIGHT/LEFT HEART CATH AND CORONARY ANGIOGRAPHY;  Surgeon: Burnell Blanks, MD;  Location: [place] [facility];  Service: Cardiovascular;  Laterality: N/A;   SYNOVIAL CYST EXCISION     lumbar spine   TONSILLECTOMY     TOTAL KNEE ARTHROPLASTY Right 01/06/2017  Procedure: RIGHT TOTAL KNEE ARTHROPLASTY;  Surgeon: Sydnee Cabal, MD;  [facility];  Service: Orthopedics;  Laterality: Right;  Adductior Block    There were no vitals filed for this visit.        [facility] PT Assessment - 08/26/21 0001       Assessment   Medical Diagnosis Low back pain and cervical neck pain    Referring Provider (PT) Hulan Saas DO    Onset Date/Surgical Date 12/19/20    Prior Therapy Yes. had therapy for his knees but limited therapy for his back      Precautions   Precautions  Back    Precaution Comments no lifitng heavy objects       [place] residence    Available Help at Discharge Family    Type of [place] to enter    Entrance Stairs-Number of Steps 3      Prior Function   Level of Independence Independent with basic ADLs    Vocation Retired    Leisure wants to walk dog and walk around park      [employer]   Attention Focused    Focused Attention Appears intact    Memory Appears intact    Awareness Appears intact    Problem Solving Appears intact      Sensation   Light Touch Appears Intact      Coordination   Gross Motor Movements are Fluid and Coordinated Yes    Fine Motor Movements are Fluid and Coordinated Yes      Posture/Postural Control   Posture/Postural Control Postural limitations    Postural Limitations Rounded Shoulders;Forward head;Decreased lumbar lordosis    Posture Comments forward head, rounded shoulder, decreased lordosis of lower back      AROM       Cervical Flexion None due to complete Fusion of C-spine    Cervical Extension None due to complete Fusion of C-spine    Cervical - Right Side Bend None due to complete Fusion of C-spine    Cervical - Left Side Bend None due to complete Fusion of C-spine    Cervical - Right Rotation \None due to complete Fusion of C-spine    Cervical - Left Rotation None due to complete Fusion of C-spine    Lumbar Flexion Limited due to fusion of L3-S1    Lumbar Extension Limited due to fusion of L3-S1    Lumbar - Right Side Bend Limited due to fusion of L3-S1    Lumbar - Left Side Bend Limited due to fusion of L3-S1    Lumbar - Right Rotation Limited due to fusion of L3-S1    Lumbar - Left Rotation Limited due to fusion of L3-S1      Strength   Right Hip Flexion 4/5    Right Hip ABduction 4+/5    Right Hip ADduction 4+/5    Left Hip Flexion 4+/5    Left Hip ABduction 4+/5    Left Hip ADduction 4+/5    Right Knee Flexion  4+/5    Right Knee Extension 4+/5    Left Knee Flexion 4+/5    Left Knee Extension 4+/5      Transfers   Transfers Sit to Stand;Stand to Sit    Sit to Stand 7: Independent    Five time sit to stand comments  16.5    Stand to Sit 7: Independent    Comments able to sit to  standing without UE      Ambulation/Gait   Gait Comments WFL outside of postural deficits due to fusion            Pt reports no falls in last 6 months.      Pt seen for aquatic therapy today.  Treatment took place in water 3.25-4 ft in depth at the [facility] pool. Temp of water was 94.  Pt entered/exited the pool via stairs (alternating) independently with bilat rail.   Warm up: sidestepping, retro walking,  toe walking, Hip opening walk; long strides 6x each standing flexion stretch at edge of pool    Exercises: standing march with 2lb ankle weights 2x20, standing hip ABD with 2 lb ankle weights 2x20, 5lb weight minisquat 2x20 standing SLS 30s 4x each, LAQ 5lb weight x20 each leg       Pt requires buoyancy for support and to offload joints with strengthening exercises. Viscosity of the water is needed for resistance of strengthening; water current perturbations provides challenge to standing balance unsupported, requiring increased core activation.                     PT Short Term Goals - 08/26/21 1239       PT SHORT TERM GOAL #1   Title Patient will report increase knee extension strength to 4/5 bilaterally.    Baseline 4-/5    Time 3    Period Weeks    Status Achieved    Target Date 08/10/21      PT SHORT TERM GOAL #2   Title Patient will report decrease pain by 30%.    Baseline 5/10 neck, 5/10 lower back    Time 3    Period Weeks    Status On-going    Target Date 08/10/21      PT SHORT TERM GOAL #3   Title Patient will report increase in 5x sit to stand by 3 seconds    Time 4    Period Weeks    Status Partially Met    Target Date 08/17/21                PT Long Term Goals - 08/26/21 1239       PT LONG TERM GOAL #1   Title Patient will improve lower extremity strenght to 5/5 throughout in order to ambulate 10 steps with decreased fatigue.    Baseline 3 steps with fatigue    Time 6    Period Weeks    Status On-going      PT LONG TERM GOAL #2   Title Patient will improve distance by 400 feet in 6 minute walk test in order walk ambulate 2 miles around community park.    Time 6    Period Weeks    Status On-going      PT LONG TERM GOAL #3   Title Patient will be independent with land and aquatic HEP in order to perform safely at home.    Baseline inital HEP    Time 6    Period Weeks    Status On-going                   Plan - 09/01/21 1248     Clinical Impression Statement Pt demonstrates signficant improvement in postural stability, gait quality, transfer efficiency, and LE strength as compared to initial evaluation. Pt able to tolerate more function simulated movements as well as reporting improvement with LE strength. Pt still finds  walking endurance to be imited at this time, but community mobility has improved as compared to intake. Pt likely to use 1-2 more aquatic visits to establish self progression of aquatic program and then transition to land based therapy in order to facilitate better understanding of restance exercise in the gym setting. Pt is interested in joining a gym to continue with exercise progression after PT. Pt would benefit from continued skilled therapy in order to reach goals and maximize functional LE lumbopelvic strength for prevention of further functional decline.    Personal Factors and Comorbidities Fitness;Time since onset of injury/illness/exacerbation;Comorbidity 3+;Past/Current Experience    Comorbidities stent palcement, bilateral knee replacements; complete cervical fusion    Examination-Activity Limitations Squat;Stairs;Stand;Locomotion Level;Transfers    Examination-Participation  Restrictions Community Activity;Shop;Meal Prep;Cleaning;Yard Work    Merchant [employer] officer Evolving/Moderate complexity    Rehab Potential Good    PT Frequency 2x / week    PT Duration 6 weeks    PT Treatment/Interventions ADLs/Self Care Home Management;Cryotherapy;Electrical Stimulation;Iontophoresis 11m/ml Dexamethasone;Moist Heat;Traction;Ultrasound;DME Instruction;Gait training;Stair training;Functional mobility training;Therapeutic activities;Therapeutic exercise;Neuromuscular re-education;Patient/family education;Manual techniques;Passive range of motion;Taping    PT Home Exercise Plan Red [place]: seated hip abduction 3x10, seated hip march 3x10, seated knee extension 3x10    Consulted and Agree with Plan of Care Patient              Patient will benefit from skilled therapeutic intervention in order to improve the following deficits and impairments:  Abnormal gait, Decreased endurance, Decreased mobility, Difficulty walking, Increased muscle spasms, Pain, Decreased activity tolerance, Decreased knowledge of precautions, Decreased range of motion, Decreased strength, Increased fascial restricitons  Visit Diagnosis: Cervicalgia  Chronic bilateral low back pain without sciatica  Other abnormalities of gait and mobility     Problem List Patient Active Problem List   Diagnosis Date Noted   Degenerative cervical disc 07/07/2021   Coronary artery disease involving native coronary artery of native heart with unstable angina pectoris [place]    Coronary artery disease of native artery of native heart with stable angina pectoris ([facility])    Pseudoarthrosis of lumbar spine 01/06/2020   Coronary artery calcification seen on CT scan 01/29/2019   Change in bowel habits 06/12/2018   Primary osteoarthritis of right knee 01/06/2017   S/P knee replacement 01/06/2017   Diplopia 07/10/2013   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

## 2021-08-31 ENCOUNTER — Other Ambulatory Visit: Payer: Self-pay

## 2021-08-31 ENCOUNTER — Ambulatory Visit (HOSPITAL_BASED_OUTPATIENT_CLINIC_OR_DEPARTMENT_OTHER): Payer: Medicare Other | Admitting: Physical Therapy

## 2021-08-31 DIAGNOSIS — M542 Cervicalgia: Secondary | ICD-10-CM

## 2021-08-31 DIAGNOSIS — R2689 Other abnormalities of gait and mobility: Secondary | ICD-10-CM

## 2021-08-31 DIAGNOSIS — M545 Low back pain, unspecified: Secondary | ICD-10-CM | POA: Diagnosis not present

## 2021-08-31 DIAGNOSIS — G8929 Other chronic pain: Secondary | ICD-10-CM | POA: Diagnosis not present

## 2021-09-01 ENCOUNTER — Encounter (HOSPITAL_BASED_OUTPATIENT_CLINIC_OR_DEPARTMENT_OTHER): Payer: Self-pay | Admitting: Physical Therapy

## 2021-09-01 NOTE — Therapy (Signed)
[facility] [address] [place], [place], [address] Phone: [phone]   Fax:  [phone]  Physical Therapy Treatment  Patient Details  Name: [name] MRN: [id number] Date of Birth: 06-Feb-1946 Referring Provider (PT): Hulan Saas DO   Encounter Date: 08/31/2021   PT End of Session - 09/01/21 1104     Visit Number 11    Number of Visits 12    Date for PT Re-Evaluation 11/30/21    Authorization Type medicare    PT Start Time 1345    PT Stop Time 9323    PT Time Calculation (min) 43 min    Activity Tolerance Patient tolerated treatment well    Behavior During Therapy 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   Paroxysmal atrial flutter ([place])     Past Surgical History:  Procedure Laterality Date   ABDOMINAL EXPOSURE N/A 01/06/2020   Procedure: ABDOMINAL EXPOSURE;  Surgeon: Rosetta Posner, MD;  [facility];  Service: Vascular;  Laterality: N/A;   ANTERIOR LUMBAR FUSION N/A 01/06/2020   Procedure: Lumbar Five- Sacral One  Anterior lumbar interbody fusion with infuse;  Surgeon: Kristeen Miss, MD;  Location: [place];  Service: Neurosurgery;  Laterality: N/A;  Lumbar Five- Sacral One  Anterior lumbar interbody fusion with infuse   CERVICAL FUSION  1996, 1997   2 surgery- skull to C7   COLON RESECTION  2007   COLONOSCOPY W/ POLYPECTOMY     CORONARY ATHERECTOMY N/A 07/01/2021   Procedure: CORONARY ATHERECTOMY;  Surgeon: Burnell Blanks, MD;   Location: [place] [facility];  Service: Cardiovascular;  Laterality: N/A;   CORONARY STENT INTERVENTION N/A 07/01/2021   Procedure: CORONARY STENT INTERVENTION;  Surgeon: Burnell Blanks, MD;  Location: [place] [facility];  Service: Cardiovascular;  Laterality: N/A;   HEMORRHOID SURGERY N/A 01/13/2016   Procedure: HEMORRHOIDECTOMY AND REMOVAL OF ANAL SKIN TAG;  Surgeon: Coralie Keens, MD;  Location: [place];  Service: General;  Laterality: N/A;   INTRAVASCULAR PRESSURE WIRE/FFR STUDY N/A 06/18/2021   Procedure: INTRAVASCULAR PRESSURE WIRE/FFR STUDY;  Surgeon: Burnell Blanks, MD;  Location: [place] [facility];  Service: Cardiovascular;  Laterality: N/A;   INTRAVASCULAR ULTRASOUND/IVUS N/A 07/01/2021   Procedure: Intravascular Ultrasound/IVUS;  Surgeon: Burnell Blanks, MD;  Location: [place] [facility];  Service: Cardiovascular;  Laterality: N/A;   REPLACEMENT TOTAL KNEE Left 2005   left   RIGHT/LEFT HEART CATH AND CORONARY ANGIOGRAPHY N/A 06/18/2021   Procedure: RIGHT/LEFT HEART CATH AND CORONARY ANGIOGRAPHY;  Surgeon: Burnell Blanks, MD;  Location: [place] [facility];  Service: Cardiovascular;  Laterality: N/A;   SYNOVIAL CYST EXCISION     lumbar spine   TONSILLECTOMY     TOTAL KNEE ARTHROPLASTY Right 01/06/2017   Procedure: RIGHT TOTAL KNEE ARTHROPLASTY;  Surgeon: Sydnee Cabal, MD;  [facility]: [facility];  Service: Orthopedics;  Laterality: Right;  Adductior Block  There were no vitals filed for this visit.   Subjective Assessment - 09/01/21 1025     Subjective Patient feels like he is doing well. He is sore when he does activity but then it improves. He feels like the intesnity of his back pain may be less at this time.    Pertinent History skull through C7 fusion, 8 surgeries, L3-S1 fusion done twice.    Limitations Sitting;Lifting;Standing;Walking    How long can you sit comfortably? limited must continue to move.    How long can you stand comfortably?  limited due to pain. <5    How long can you walk comfortably? limited 2-3 mins before pain starts. cannot walk due to pain. at most across parking lot.    Diagnostic tests CT of the lumbar spine taken in August 2020 was independently visualized by me.  Patient did unfortunately have an L5-S1 fusion and it appeared that there was loosening of the S1 screws bilaterally right greater than left.  Patient has also had a past medical history for a L3-L5 fusion noted.  Patient did have severe left-sided L2-L3 narrowing with moderate central canal stenosis.  Intra surgical imaging from January 2021 showed an anterior fusion at L5-S1 and interbody fusion that was loose seem to be removed.    Patient Stated Goals get out and walk, 1 mile and back. [place] park and wants to walk. wants to improve stregnth of LE in order to ambulate safely especially steps and stairs. (can only go up 3-5 steps with fatigue with hand rail),    Currently in Pain? No/denies   Not at this time. Only when standing and walking                Pt seen for aquatic therapy today.  Treatment took place in water 3.25-4 ft in depth at the [employer] pool. Temp of water was 90.  Pt entered/exited the pool via stairs (alternating) independently with bilat rail.   Warm up: sidestepping, retro walking,  Hip opening walk; long strides 4x each standing flexion stretch at edge of pool    Exercises: standing foam DB press and row in half squat 20x, standing march with 2lb ankle weights 20x, standing hip ABD with 2 lb ankle weights 2x10, minisquat without UE support 2x10, fwd step up at bottom step 2x10,    Seated board stretch x10 fwd x10 side to side 5 sec holds each; board press seated x20;    LAQ 3lb weight x20 each leg    Step up 2x10 each leg and lateral step up        Pt requires buoyancy for support and to offload joints with strengthening exercises. Viscosity of the water is needed for resistance of strengthening;  water current perturbations provides challenge to standing balance unsupported, requiring increased core activation.                             PT Education - 09/01/21 1032     Education Details Reviewed home exercises    Person(s) Educated Patient    Methods Explanation;Demonstration;Tactile cues;Verbal cues    Comprehension Verbalized understanding;Returned demonstration;Verbal cues required;Tactile cues required              PT Short Term Goals - 08/26/21 1239       PT SHORT TERM GOAL #1   Title Patient will report increase knee extension strength to 4/5 bilaterally.    Baseline  4-/5    Time 3    Period Weeks    Status Achieved    Target Date 08/10/21      PT SHORT TERM GOAL #2   Title Patient will report decrease pain by 30%.    Baseline 5/10 neck, 5/10 lower back    Time 3    Period Weeks    Status On-going    Target Date 08/10/21      PT SHORT TERM GOAL #3   Title Patient will report increase in 5x sit to stand by 3 seconds    Time 4    Period Weeks    Status Partially Met    Target Date 08/17/21               PT Long Term Goals - 08/26/21 1239       PT LONG TERM GOAL #1   Title Patient will improve lower extremity strenght to 5/5 throughout in order to ambulate 10 steps with decreased fatigue.    Baseline 3 steps with fatigue    Time 6    Period Weeks    Status On-going      PT LONG TERM GOAL #2   Title Patient will improve distance by 400 feet in 6 minute walk test in order walk ambulate 2 miles around community park.    Time 6    Period Weeks    Status On-going      PT LONG TERM GOAL #3   Title Patient will be independent with land and aquatic HEP in order to perform safely at home.    Baseline inital HEP    Time 6    Period Weeks    Status On-going                   Plan - 09/01/21 1324     Clinical Impression Statement Patient tolerated treamtnet well. Therapy reviewed steps with him with 2lb  weights. Patient performed warm up exercises without cuing. he is progressing towards a home program well. Therapy will review a gym program with him over the next few visits and continue to progress towards gym program.    Personal Factors and Comorbidities Fitness;Time since onset of injury/illness/exacerbation;Comorbidity 3+;Past/Current Experience    Comorbidities stent palcement, bilateral knee replacements; complete cervical fusion    Examination-Activity Limitations Squat;Stairs;Stand;Locomotion Level;Transfers    Stability/Clinical Decision Making Evolving/Moderate complexity    Clinical Decision Making Moderate    Rehab Potential Good    PT Frequency 2x / week    PT Duration 6 weeks    PT Treatment/Interventions ADLs/Self Care Home Management;Cryotherapy;Electrical Stimulation;Iontophoresis 74m/ml Dexamethasone;Moist Heat;Traction;Ultrasound;DME Instruction;Gait training;Stair training;Functional mobility training;Therapeutic activities;Therapeutic exercise;Neuromuscular re-education;Patient/family education;Manual techniques;Passive range of motion;Taping    PT Next Visit Plan Perform 5x sit to stand and/or 6 minute walk test during next land visit in order access endurance and balance. Manuel therapy to decrease muscle tightness and spasm.    PT Home Exercise Plan [place]: seated hip abduction 3x10, seated hip march 3x10, seated knee extension 3x10    Consulted and Agree with Plan of Care Patient             Patient will benefit from skilled therapeutic intervention in order to improve the following deficits and impairments:  Abnormal gait, Decreased endurance, Decreased mobility, Difficulty walking, Increased muscle spasms, Pain, Decreased activity tolerance, Decreased knowledge of precautions, Decreased range of motion, Decreased strength, Increased fascial restricitons  Visit Diagnosis: Cervicalgia  Chronic bilateral low back pain  without sciatica  Other abnormalities of  gait and mobility     Problem List Patient Active Problem List   Diagnosis Date Noted   Degenerative cervical disc 07/07/2021   Coronary artery disease involving native coronary artery of native heart with unstable angina pectoris [facility])    Coronary artery disease of native artery of native heart with stable angina pectoris ([place])    Pseudoarthrosis of lumbar spine 01/06/2020   Coronary artery calcification seen on CT scan 01/29/2019   Change in bowel habits 06/12/2018   Primary osteoarthritis of right knee 01/06/2017   S/P knee replacement 01/06/2017   Diplopia 07/10/2013   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

## 2021-09-01 NOTE — Addendum Note (Signed)
Addended by: Daleen Bo on: 09/01/2021 12:54 PM   Modules accepted: Orders

## 2021-09-02 ENCOUNTER — Encounter (HOSPITAL_BASED_OUTPATIENT_CLINIC_OR_DEPARTMENT_OTHER): Payer: Self-pay | Admitting: Physical Therapy

## 2021-09-02 ENCOUNTER — Other Ambulatory Visit: Payer: Self-pay

## 2021-09-02 ENCOUNTER — Ambulatory Visit (HOSPITAL_BASED_OUTPATIENT_CLINIC_OR_DEPARTMENT_OTHER): Payer: Medicare Other | Admitting: Physical Therapy

## 2021-09-02 DIAGNOSIS — R2689 Other abnormalities of gait and mobility: Secondary | ICD-10-CM

## 2021-09-02 DIAGNOSIS — G8929 Other chronic pain: Secondary | ICD-10-CM

## 2021-09-02 DIAGNOSIS — M542 Cervicalgia: Secondary | ICD-10-CM | POA: Diagnosis not present

## 2021-09-02 DIAGNOSIS — M545 Low back pain, unspecified: Secondary | ICD-10-CM | POA: Diagnosis not present

## 2021-09-02 NOTE — Progress Notes (Signed)
[facility] [place] Phone: [phone] Subjective:   [name], am serving as a scribe for Dr. Hulan Saas.  I'm seeing this patient by the request  of:  [name], MD  CC: Back and neck pain follow-up  [id number]  07/06/2021 Patient likely will not make any significant improvement with the range of motion of the neck with the fusion of the complete cervical spine.  Likely no significant weakness of the hands noted at this time.  We will monitor but do not think significant changes will be possible.  Significant arthritic changes with effusions with 1 failure but recently fixed last year.  Patient does have poor core strength and I do think patient needs to start increasing activity.  Would consider the possibility of aquatic therapy but will need clearance from cardiology.  We will send a note to them to see if they have an estimate of when to get moving again.  Patient likely after being stented we will start with cardiac rehab but possibly starting with aquatic therapy may be more beneficial initially to gain strength so we can optimize that therapy and cardiac rehab.  Did not make any significant changes in medications at this time.  Encourage patient to continue to monitor weight.  Follow-up with me again in 6 to 8 weeks.  Update 09/07/2021 [name] is a 75 y.o. male coming in with complaint of back and neck pain. Patient has been doing aquatic therapy. Patient states neck feels the same as it usually does, but the exercise routine is helping with his balance, going up and down steps and up and down from sitting and getting out of the car. Patient does want to be able to start going on his daily walks again, can so far only walk about 50 yards. Knows this will take time.        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   Paroxysmal atrial flutter ([place])    Past Surgical History:  Procedure Laterality Date   ABDOMINAL EXPOSURE N/A 01/06/2020   Procedure: ABDOMINAL EXPOSURE;  Surgeon: Rosetta Posner, MD;  [facility];  Service: Vascular;  Laterality: N/A;   ANTERIOR LUMBAR FUSION N/A 01/06/2020   Procedure: Lumbar Five- Sacral One  Anterior lumbar interbody fusion with infuse;  Surgeon: Kristeen Miss, MD;  Location: [place];  Service: Neurosurgery;  Laterality: N/A;  Lumbar Five- Sacral One  Anterior lumbar interbody fusion with infuse   CERVICAL FUSION  1996, 1997   2 surgery- skull to C7   COLON RESECTION  2007   COLONOSCOPY W/ POLYPECTOMY     CORONARY ATHERECTOMY N/A 07/01/2021   Procedure: CORONARY ATHERECTOMY;  Surgeon: Burnell Blanks, MD;  Location: [place] [facility];  Service: Cardiovascular;  Laterality: N/A;   CORONARY STENT INTERVENTION N/A 07/01/2021   Procedure: CORONARY STENT INTERVENTION;  Surgeon: Burnell Blanks, MD;  Location: [place] [facility];  Service: Cardiovascular;  Laterality:  N/A;   HEMORRHOID SURGERY N/A 01/13/2016   Procedure: HEMORRHOIDECTOMY AND REMOVAL OF ANAL SKIN TAG;  Surgeon: Coralie Keens, MD;  Location: [place];  Service: General;  Laterality: N/A;   INTRAVASCULAR PRESSURE WIRE/FFR STUDY N/A 06/18/2021   Procedure: INTRAVASCULAR PRESSURE WIRE/FFR STUDY;  Surgeon: Burnell Blanks, MD;  Location: [place] [facility];  Service: Cardiovascular;  Laterality: N/A;   INTRAVASCULAR ULTRASOUND/IVUS N/A 07/01/2021   Procedure: Intravascular Ultrasound/IVUS;  Surgeon: Burnell Blanks, MD;  Location: [place] [facility];   Service: Cardiovascular;  Laterality: N/A;   REPLACEMENT TOTAL KNEE Left 2005   left   RIGHT/LEFT HEART CATH AND CORONARY ANGIOGRAPHY N/A 06/18/2021   Procedure: RIGHT/LEFT HEART CATH AND CORONARY ANGIOGRAPHY;  Surgeon: Burnell Blanks, MD;  Location: [place] [facility];  Service: Cardiovascular;  Laterality: N/A;   SYNOVIAL CYST EXCISION     lumbar spine   TONSILLECTOMY     TOTAL KNEE ARTHROPLASTY Right 01/06/2017   Procedure: RIGHT TOTAL KNEE ARTHROPLASTY;  Surgeon: Sydnee Cabal, MD;  [facility];  Service: Orthopedics;  Laterality: Right;  Adductior Block   Social History   Socioeconomic History   Marital status: Married    Spouse name: Zigmund Daniel   Number of children: 4   Years of education: 12   Highest education level: Not on file  Occupational History   Occupation: Retired    Fish farm manager: RETIRED  Tobacco Use   Smoking status: Never   Smokeless tobacco: Never  Vaping Use   Vaping Use: Never used  Substance and Sexual Activity   Alcohol use: No   Drug use: No   Sexual activity: Not on file  Other Topics Concern   Not on file  Social History Narrative   Patient lives at home with Wife Zigmund Daniel.    Patient has 4 children.    Patient is currently not working. He is retired and disabled.    Patient has a high school education.    Social Determinants of Health   Financial Resource Strain: Not on file  Food Insecurity: Not on file  Transportation Needs: Not on file  Physical Activity: Not on file  Stress: Not on file  Social Connections: Not on file   Allergies  Allergen Reactions   Alfuzosin Other (See Comments)    Felt winded/faint   Gabapentin Nausea Only   Pregabalin Nausea Only and Other (See Comments)    Felt poorly   Tamsulosin Hcl Other (See Comments)    Orthostatic hypotension   Contrast Media [Iodinated Diagnostic Agents] Rash    Cath dye 06/18/21 rash started next day   Niacin Diarrhea   Family History  Problem Relation Age of Onset   Coronary  artery disease Mother    Coronary artery disease Father    Colon cancer Neg Hx    Stomach cancer Neg Hx      Current Outpatient Medications (Cardiovascular):    amLODipine (NORVASC) 5 MG tablet, TAKE 1 TABLET BY MOUTH TWICE A DAY   benazepril (LOTENSIN) 20 MG tablet, Take 1 tablet (20 mg total) by mouth daily.   hydrochlorothiazide (,MICROZIDE/HYDRODIURIL,) 12.5 MG capsule, Take 12.5 mg by mouth daily with lunch.    metoprolol tartrate (LOPRESSOR) 25 MG tablet, Take 25 mg by mouth daily as needed (afib.).    nitroGLYCERIN (NITROSTAT) 0.4 MG SL tablet, Place 1 tablet (0.4 mg total) under the tongue every 5 (five) minutes as needed.   rosuvastatin (CRESTOR) 5 MG tablet, Patient take 2 tablets by mouth every  other day (10 mg total)   Current Outpatient Medications (Analgesics):    aspirin EC 81 MG tablet, Take 81 mg by mouth every evening. Swallow whole.   oxyCODONE (ROXICODONE) 5 MG immediate release tablet, Take 1-2 tablets (5-10 mg total) by mouth every 4 (four) hours as needed for severe pain or breakthrough pain.  Current Outpatient Medications (Hematological):    clopidogrel (PLAVIX) 75 MG tablet, Take 600 mg (8 tablets) on Day 1 and then take 75 mg daily.  Start on 06/28/21.  Current Outpatient Medications (Other):    B Complex-C (B-COMPLEX WITH VITAMIN C) tablet, Take 1 tablet by mouth daily with lunch.   Cholecalciferol (VITAMIN D3) 50 MCG (2000 UT) TABS, Take 2,000 Units by mouth daily with lunch. B complex, vitamin c 500   Glucosamine-Chondroitin (COSAMIN DS PO), Take 1 tablet by mouth every evening.   methocarbamol (ROBAXIN) 500 MG tablet, Take 500 mg by mouth every 8 (eight) hours as needed for muscle spasms.   Multiple Vitamin (MULTIVITAMIN WITH MINERALS) TABS tablet, Take 1 tablet by mouth daily with lunch. Adult 50+   scopolamine (TRANSDERM-SCOP) 1 MG/3DAYS, Place 1 patch onto the skin every three (3) days as needed (nausea).   vitamin C (ASCORBIC ACID) 500 MG tablet, Take  500 mg by mouth daily with lunch.   Zinc 50 MG TABS, Take 50 mg by mouth every evening.   Reviewed prior external information including notes and imaging from  primary care provider As well as notes that were available from care everywhere and other healthcare systems.  This included patient's most recent physical therapy notes were patient has made good strides and has been making some improvement.  Past medical history, social, surgical and family history all reviewed in electronic medical record.  No pertanent information unless stated regarding to the chief complaint.   Review of Systems:  No headache, visual changes, nausea, vomiting, diarrhea, constipation, dizziness, abdominal pain, skin rash, fevers, chills, night sweats, weight loss, swollen lymph nodes, body aches, joint swelling, chest pain, shortness of breath, mood changes. POSITIVE muscle aches  Objective  Blood pressure 118/70, pulse 60, height '5\' 11"'$  (1.803 m), weight 256 lb (116.1 kg), SpO2 95 %.   General: No apparent distress alert and oriented x3 mood and affect normal, dressed appropriately.  HEENT: Pupils equal, extraocular movements intact  Respiratory: Patient's speak in full sentences and does not appear short of breath  Cardiovascular: No lower extremity edema, non tender, no erythema  Gait mildly antalgic with more of a wide-based gait Neck exam has a very small to no significant range of motion. Low back exam and patient is comfortable noted sitting in the chair.  Patient is moving significantly better and is able to get out of the chair without using his arms.   Impression and Recommendations:    The above documentation has been reviewed and is accurate and complete Lyndal Pulley, DO

## 2021-09-02 NOTE — Therapy (Signed)
[facility] [address] [place], [place], [address] Phone: [phone]   Fax:  [phone]  Physical Therapy Treatment  Patient Details  Name: [name] MRN: [id number] Date of Birth: Aug 30, 1946 Referring Provider (PT): Hulan Saas DO   Encounter Date: 09/02/2021   PT End of Session - 09/02/21 0926     Visit Number 11    Number of Visits 17    Date for PT Re-Evaluation 11/30/21    Authorization Type medicare    PT Start Time 0930    PT Stop Time 1010    PT Time Calculation (min) 40 min    Activity Tolerance Patient tolerated treatment well    Behavior During Therapy [facility] for tasks assessed/performed             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   Paroxysmal atrial flutter ([place])     Past Surgical History:  Procedure Laterality Date   ABDOMINAL EXPOSURE N/A 01/06/2020   Procedure: ABDOMINAL EXPOSURE;  Surgeon: Rosetta Posner, MD;  [facility];  Service: Vascular;  Laterality: N/A;   ANTERIOR LUMBAR FUSION N/A 01/06/2020   Procedure: Lumbar Five- Sacral One  Anterior lumbar interbody fusion with infuse;  Surgeon: Kristeen Miss, MD;  Location: [facility];  Service: Neurosurgery;  Laterality: N/A;  Lumbar Five- Sacral One  Anterior lumbar interbody fusion with infuse   CERVICAL FUSION  1996, 1997   2 surgery- skull to C7   COLON RESECTION  2007   COLONOSCOPY W/ POLYPECTOMY     CORONARY ATHERECTOMY N/A 07/01/2021   Procedure: CORONARY ATHERECTOMY;  Surgeon: Burnell Blanks, MD;   Location: [facility];  Service: Cardiovascular;  Laterality: N/A;   CORONARY STENT INTERVENTION N/A 07/01/2021   Procedure: CORONARY STENT INTERVENTION;  Surgeon: Burnell Blanks, MD;  Location: [facility];  Service: Cardiovascular;  Laterality: N/A;   HEMORRHOID SURGERY N/A 01/13/2016   Procedure: HEMORRHOIDECTOMY AND REMOVAL OF ANAL SKIN TAG;  Surgeon: Coralie Keens, MD;  Location: [facility];  Service: General;  Laterality: N/A;   INTRAVASCULAR PRESSURE WIRE/FFR STUDY N/A 06/18/2021   Procedure: INTRAVASCULAR PRESSURE WIRE/FFR STUDY;  Surgeon: Burnell Blanks, MD;  Location: [facility];  Service: Cardiovascular;  Laterality: N/A;   INTRAVASCULAR ULTRASOUND/IVUS N/A 07/01/2021   Procedure: Intravascular Ultrasound/IVUS;  Surgeon: Burnell Blanks, MD;  Location: [facility];  Service: Cardiovascular;  Laterality: N/A;   REPLACEMENT TOTAL KNEE Left 2005   left   RIGHT/LEFT HEART CATH AND CORONARY ANGIOGRAPHY N/A 06/18/2021   Procedure: RIGHT/LEFT HEART CATH AND CORONARY ANGIOGRAPHY;  Surgeon: Burnell Blanks, MD;  Location: [facility];  Service: Cardiovascular;  Laterality: N/A;   SYNOVIAL CYST EXCISION     lumbar spine   TONSILLECTOMY     TOTAL KNEE ARTHROPLASTY Right 01/06/2017   Procedure: RIGHT TOTAL KNEE ARTHROPLASTY;  Surgeon: Sydnee Cabal, MD;  [facility];  Service: Orthopedics;  Laterality: Right;  Adductior Block  There were no vitals filed for this visit.   Subjective Assessment - 09/02/21 0932     Subjective Pt states he felt good after last session. He was more fatigued but had no increased.    Pertinent History skull through C7 fusion, 8 surgeries, L3-S1 fusion done twice.    Limitations Sitting;Lifting;Standing;Walking    How long can you sit comfortably? limited must continue to move.    How long can you stand comfortably? limited due to pain. <5    How long can you walk comfortably? limited 2-3 mins  before pain starts. cannot walk due to pain. at most across parking lot.    Diagnostic tests CT of the lumbar spine taken in August 2020 was independently visualized by me.  Patient did unfortunately have an L5-S1 fusion and it appeared that there was loosening of the S1 screws bilaterally right greater than left.  Patient has also had a past medical history for a L3-L5 fusion noted.  Patient did have severe left-sided L2-L3 narrowing with moderate central canal stenosis.  Intra surgical imaging from January 2021 showed an anterior fusion at L5-S1 and interbody fusion that was loose seem to be removed.    Patient Stated Goals get out and walk, 1 mile and back. [place] and wants to walk. wants to improve stregnth of LE in order to ambulate safely especially steps and stairs. (can only go up 3-5 steps with fatigue with hand rail),    Currently in Pain? Yes    Pain Score 5     Pain Location Back    Pain Orientation Right;Lower    Pain Descriptors / Indicators Aching    Multiple Pain Sites Yes    Pain Score 5    Pain Location Neck           Pt seen for aquatic therapy today.  Treatment took place in water 3.25-4 ft in depth at the [facility] pool. Temp of water was 94.  Pt entered/exited the pool via stairs (alternating) independently with bilat rail.   Warm up: sidestepping, retro walking,  Hip opening walk; long strides 4x each standing flexion stretch at edge of pool    Exercises: standing foam board press and row in half squat 20x, board rotation in half squat 20x, standing march with 5lb ankle weights 20x, standing hip ABD with 5 lb ankle weights 2x10, minisquat without UE support 2x10 10lb, fwd and lateral step up at bottom step 5lb ankle weights 2x10,      LAQ 5lb weight x20 each leg           Pt requires buoyancy for support and to offload joints with strengthening exercises. Viscosity of the water is needed for resistance of strengthening; water current  perturbations provides challenge to standing balance unsupported, requiring increased core activation.               PT Short Term Goals - 08/26/21 1239       PT SHORT TERM GOAL #1   Title Patient will report increase knee extension strength to 4/5 bilaterally.    Baseline 4-/5    Time 3    Period Weeks    Status Achieved    Target Date 08/10/21      PT SHORT TERM GOAL #2   Title Patient will report decrease pain by 30%.    Baseline 5/10 neck, 5/10 lower back    Time 3    Period Weeks    Status On-going  Target Date 08/10/21      PT SHORT TERM GOAL #3   Title Patient will report increase in 5x sit to stand by 3 seconds    Time 4    Period Weeks    Status Partially Met    Target Date 08/17/21               PT Long Term Goals - 08/26/21 1239       PT LONG TERM GOAL #1   Title Patient will improve lower extremity strenght to 5/5 throughout in order to ambulate 10 steps with decreased fatigue.    Baseline 3 steps with fatigue    Time 6    Period Weeks    Status On-going      PT LONG TERM GOAL #2   Title Patient will improve distance by 400 feet in 6 minute walk test in order walk ambulate 2 miles around community park.    Time 6    Period Weeks    Status On-going      PT LONG TERM GOAL #3   Title Patient will be independent with land and aquatic HEP in order to perform safely at home.    Baseline inital HEP    Time 6    Period Weeks    Status On-going                   Plan - 09/02/21 0926     Clinical Impression Statement Pt able to continue with progression of intensity with pool based exercise. Pt without increase in pain. Plan to transition to land after set up aquatic HEP. Pt likely able to perform land based exercise and begin introduction to gym based strength training starting with limited degrees of freedom machines. Pt would benefit from continued skilled therapy in order to reach goals and maximize functional LE lumbopelvic  strength for prevention of further functional decline.    Personal Factors and Comorbidities Fitness;Time since onset of injury/illness/exacerbation;Comorbidity 3+;Past/Current Experience    Comorbidities stent palcement, bilateral knee replacements; complete cervical fusion    Examination-Activity Limitations Squat;Stairs;Stand;Locomotion Level;Transfers    Stability/Clinical Decision Making Evolving/Moderate complexity    Rehab Potential Good    PT Frequency 2x / week    PT Duration 6 weeks    PT Treatment/Interventions ADLs/Self Care Home Management;Cryotherapy;Electrical Stimulation;Iontophoresis 79m/ml Dexamethasone;Moist Heat;Traction;Ultrasound;DME Instruction;Gait training;Stair training;Functional mobility training;Therapeutic activities;Therapeutic exercise;Neuromuscular re-education;Patient/family education;Manual techniques;Passive range of motion;Taping    PT Next Visit Plan Perform 5x sit to stand and/or 6 minute walk test during next land visit in order access endurance and balance. Manuel therapy to decrease muscle tightness and spasm.    PT Home Exercise Plan Red Band: seated hip abduction 3x10, seated hip march 3x10, seated knee extension 3x10    Consulted and Agree with Plan of Care Patient             Patient will benefit from skilled therapeutic intervention in order to improve the following deficits and impairments:  Abnormal gait, Decreased endurance, Decreased mobility, Difficulty walking, Increased muscle spasms, Pain, Decreased activity tolerance, Decreased knowledge of precautions, Decreased range of motion, Decreased strength, Increased fascial restricitons  Visit Diagnosis: Cervicalgia  Chronic bilateral low back pain without sciatica  Other abnormalities of gait and mobility     Problem List Patient Active Problem List   Diagnosis Date Noted   Degenerative cervical disc 07/07/2021   Coronary artery disease involving native coronary artery of native  heart with unstable angina pectoris ([place]  Coronary artery disease of native artery of native heart with stable angina pectoris ([place])    Pseudoarthrosis of lumbar spine 01/06/2020   Coronary artery calcification seen on CT scan 01/29/2019   Change in bowel habits 06/12/2018   Primary osteoarthritis of right knee 01/06/2017   S/P knee replacement 01/06/2017   Diplopia 07/10/2013   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 [place], [place], [address] Phone: [phone]   Fax:  [phone]  Name: [name] MRN: [id number] Date of Birth: [birth year]

## 2021-09-06 ENCOUNTER — Other Ambulatory Visit: Payer: Self-pay

## 2021-09-06 ENCOUNTER — Ambulatory Visit (HOSPITAL_BASED_OUTPATIENT_CLINIC_OR_DEPARTMENT_OTHER): Payer: Medicare Other | Admitting: Physical Therapy

## 2021-09-06 ENCOUNTER — Encounter (HOSPITAL_BASED_OUTPATIENT_CLINIC_OR_DEPARTMENT_OTHER): Payer: Self-pay | Admitting: Physical Therapy

## 2021-09-06 DIAGNOSIS — R2689 Other abnormalities of gait and mobility: Secondary | ICD-10-CM

## 2021-09-06 DIAGNOSIS — L819 Disorder of pigmentation, unspecified: Secondary | ICD-10-CM | POA: Diagnosis not present

## 2021-09-06 DIAGNOSIS — L821 Other seborrheic keratosis: Secondary | ICD-10-CM | POA: Diagnosis not present

## 2021-09-06 DIAGNOSIS — M545 Low back pain, unspecified: Secondary | ICD-10-CM | POA: Diagnosis not present

## 2021-09-06 DIAGNOSIS — Z85820 Personal history of malignant melanoma of skin: Secondary | ICD-10-CM | POA: Diagnosis not present

## 2021-09-06 DIAGNOSIS — L814 Other melanin hyperpigmentation: Secondary | ICD-10-CM | POA: Diagnosis not present

## 2021-09-06 DIAGNOSIS — M542 Cervicalgia: Secondary | ICD-10-CM | POA: Diagnosis not present

## 2021-09-06 DIAGNOSIS — Z08 Encounter for follow-up examination after completed treatment for malignant neoplasm: Secondary | ICD-10-CM | POA: Diagnosis not present

## 2021-09-06 DIAGNOSIS — D225 Melanocytic nevi of trunk: Secondary | ICD-10-CM | POA: Diagnosis not present

## 2021-09-06 DIAGNOSIS — L298 Other pruritus: Secondary | ICD-10-CM | POA: Diagnosis not present

## 2021-09-06 DIAGNOSIS — L218 Other seborrheic dermatitis: Secondary | ICD-10-CM | POA: Diagnosis not present

## 2021-09-06 DIAGNOSIS — D492 Neoplasm of unspecified behavior of bone, soft tissue, and skin: Secondary | ICD-10-CM | POA: Diagnosis not present

## 2021-09-06 DIAGNOSIS — G8929 Other chronic pain: Secondary | ICD-10-CM | POA: Diagnosis not present

## 2021-09-06 DIAGNOSIS — L570 Actinic keratosis: Secondary | ICD-10-CM | POA: Diagnosis not present

## 2021-09-06 DIAGNOSIS — B079 Viral wart, unspecified: Secondary | ICD-10-CM | POA: Diagnosis not present

## 2021-09-06 NOTE — Therapy (Signed)
[facility] [address] [place], [place], [address] Phone: [phone]   Fax:  [phone]  Physical Therapy Treatment  Patient Details  Name: [name] MRN: [id number] Date of Birth: 1946-07-26 Referring Provider (PT): Antoine Primas DO   Encounter Date: 09/06/2021   PT End of Session - 09/06/21 0933     Visit Number 12    Number of Visits 17    Date for PT Re-Evaluation 11/30/21    Authorization Type medicare    PT Start Time 0930    PT Stop Time 1010    PT Time Calculation (min) 40 min    Activity Tolerance Patient tolerated treatment well    Behavior During Therapy [facility] for tasks assessed/performed             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   Paroxysmal atrial flutter ([facility])     Past Surgical History:  Procedure Laterality Date   ABDOMINAL EXPOSURE N/A 01/06/2020   Procedure: ABDOMINAL EXPOSURE;  Surgeon: Larina Earthly, MD;  [facility];  Service: Vascular;  Laterality: N/A;   ANTERIOR LUMBAR FUSION N/A 01/06/2020   Procedure: Lumbar Five- Sacral One  Anterior lumbar interbody fusion with infuse;  Surgeon: Barnett Abu, MD;  [facility];  Service: Neurosurgery;  Laterality: N/A;  Lumbar Five- Sacral One  Anterior lumbar interbody fusion with infuse   CERVICAL FUSION  1996, 1997   2 surgery- skull to C7   COLON RESECTION  2007   COLONOSCOPY W/ POLYPECTOMY     CORONARY ATHERECTOMY N/A 07/01/2021   Procedure: CORONARY ATHERECTOMY;  Surgeon: Kathleene Hazel, MD;   [facility];  Service: Cardiovascular;  Laterality: N/A;   CORONARY STENT INTERVENTION N/A 07/01/2021   Procedure: CORONARY STENT INTERVENTION;  Surgeon: Kathleene Hazel, MD;  [facility];  Service: Cardiovascular;  Laterality: N/A;   HEMORRHOID SURGERY N/A 01/13/2016   Procedure: HEMORRHOIDECTOMY AND REMOVAL OF ANAL SKIN TAG;  Surgeon: Abigail Miyamoto, MD;  [facility];  Service: General;  Laterality: N/A;   INTRAVASCULAR PRESSURE WIRE/FFR STUDY N/A 06/18/2021   Procedure: INTRAVASCULAR PRESSURE WIRE/FFR STUDY;  Surgeon: Kathleene Hazel, MD;  [facility];  Service: Cardiovascular;  Laterality: N/A;   INTRAVASCULAR ULTRASOUND/IVUS N/A 07/01/2021   Procedure: Intravascular Ultrasound/IVUS;  Surgeon: Kathleene Hazel, MD;  [facility];  Service: Cardiovascular;  Laterality: N/A;   REPLACEMENT TOTAL KNEE Left 2005   left   RIGHT/LEFT HEART CATH AND CORONARY ANGIOGRAPHY N/A 06/18/2021   Procedure: RIGHT/LEFT HEART CATH AND CORONARY ANGIOGRAPHY;  Surgeon: Kathleene Hazel, MD;  [facility];  Service: Cardiovascular;  Laterality: N/A;   SYNOVIAL CYST EXCISION     lumbar spine   TONSILLECTOMY     TOTAL KNEE ARTHROPLASTY Right 01/06/2017   Procedure: RIGHT TOTAL KNEE ARTHROPLASTY;  Surgeon: Eugenia Mcalpine, MD;  [facility];  Service: Orthopedics;  Laterality: Right;  Adductior Block  There were no vitals filed for this visit.   Subjective Assessment - 09/06/21 0932     Subjective Pt states he had no increased pain after last session. He felt like he was "worked out."    Pertinent History skull through C7 fusion, 8 surgeries, L3-S1 fusion done twice.    Limitations Sitting;Lifting;Standing;Walking    How long can you sit comfortably? limited must continue to move.    How long can you stand comfortably? limited due to pain. <5    How long can you walk comfortably? limited 2-3 mins  before pain starts. cannot walk due to pain. at most across parking lot.    Diagnostic tests CT of the lumbar spine taken in August 2020 was independently visualized by me.  Patient did unfortunately have an L5-S1 fusion and it appeared that there was loosening of the S1 screws bilaterally right greater than left.  Patient has also had a past medical history for a L3-L5 fusion noted.  Patient did have severe left-sided L2-L3 narrowing with moderate central canal stenosis.  Intra surgical imaging from January 2021 showed an anterior fusion at L5-S1 and interbody fusion that was loose seem to be removed.    Patient Stated Goals get out and walk, 1 mile and back. [place] and [place] walk. wants to improve stregnth of LE in order to ambulate safely especially steps and stairs. (can only go up 3-5 steps with fatigue with hand rail),    Currently in Pain? Yes    Pain Score 5     Pain Location Back    Pain Orientation Right;Lower    Pain Descriptors / Indicators Aching    Pain Score 5    Pain Location Neck    Pain Orientation Right;Left    Pain Descriptors / Indicators Aching                 Pt seen for aquatic therapy today.  Treatment took place in water 3.25-4 ft in depth at the [facility] pool. Temp of water was 94.  Pt entered/exited the pool via stairs (alternating) independently with bilat rail.   Warm up: with foam board resistance- sidestepping, retro walking,  Hip opening walk; long strides 4x each   Exercises: standing split squat-shallow end 2x10; board rotation in half squat 20x, standing march with 5lb ankle weights 20x, standing hip ABD with 5 lb ankle weights 2x10, minisquat without UE support 2x10 10lb, fwd and lateral step up at bottom step 5lb ankle weights 2x10,      LAQ 5lb weight x20 each leg        Pt requires buoyancy for support and to offload joints with strengthening exercises. Viscosity of the water is needed for resistance of strengthening;  water current perturbations provides challenge to standing balance unsupported, requiring increased core activation.                             PT Short Term Goals - 08/26/21 1239       PT SHORT TERM GOAL #1   Title Patient will report increase knee extension strength to 4/5 bilaterally.    Baseline 4-/5    Time 3    Period Weeks    Status Achieved    Target Date 08/10/21      PT SHORT TERM GOAL #2   Title Patient will report decrease pain by 30%.    Baseline 5/10 neck, 5/10 lower back  Time 3    Period Weeks    Status On-going    Target Date 08/10/21      PT SHORT TERM GOAL #3   Title Patient will report increase in 5x sit to stand by 3 seconds    Time 4    Period Weeks    Status Partially Met    Target Date 08/17/21               PT Long Term Goals - 08/26/21 1239       PT LONG TERM GOAL #1   Title Patient will improve lower extremity strenght to 5/5 throughout in order to ambulate 10 steps with decreased fatigue.    Baseline 3 steps with fatigue    Time 6    Period Weeks    Status On-going      PT LONG TERM GOAL #2   Title Patient will improve distance by 400 feet in 6 minute walk test in order walk ambulate [place].    Time 6    Period Weeks    Status On-going      PT LONG TERM GOAL #3   Title Patient will be independent with land and aquatic HEP in order to perform safely at home.    Baseline inital HEP    Time 6    Period Weeks    Status On-going                   Plan - 09/06/21 1016     Clinical Impression Statement Pt with good tolerance to progression of aquatic therpay exercise. Pt able to perform split stance squatting/lunging as well balance exercise with minimal to no UE support in chest deep water. Pt had difficulty with SLS in 10% BW due to turbulence of the pool environment but showed good correction at the ankle. Plan to perform one more aquatic visit to set up aquatic HEP should  pt join the [facility] and the transition to land based strength and endurance training.  Pt would benefit from continued skilled therapy in order to reach goals and maximize functional LE lumbopelvic strength for prevention of further functional decline.    Personal Factors and Comorbidities Fitness;Time since onset of injury/illness/exacerbation;Comorbidity 3+;Past/Current Experience    Comorbidities stent palcement, bilateral knee replacements; complete cervical fusion    Examination-Activity Limitations Squat;Stairs;Stand;Locomotion Level;Transfers    Stability/Clinical Decision Making Evolving/Moderate complexity    Rehab Potential Good    PT Frequency 2x / week    PT Duration 6 weeks    PT Treatment/Interventions ADLs/Self Care Home Management;Cryotherapy;Electrical Stimulation;Iontophoresis 49m/ml Dexamethasone;Moist Heat;Traction;Ultrasound;DME Instruction;Gait training;Stair training;Functional mobility training;Therapeutic activities;Therapeutic exercise;Neuromuscular re-education;Patient/family education;Manual techniques;Passive range of motion;Taping    PT Next Visit Plan Perform 5x sit to stand and/or 6 minute walk test during next land visit in order access endurance and balance. Manuel therapy to decrease muscle tightness and spasm.    PT Home Exercise Plan Red Band: seated hip abduction 3x10, seated hip march 3x10, seated knee extension 3x10    Consulted and Agree with Plan of Care Patient             Patient will benefit from skilled therapeutic intervention in order to improve the following deficits and impairments:  Abnormal gait, Decreased endurance, Decreased mobility, Difficulty walking, Increased muscle spasms, Pain, Decreased activity tolerance, Decreased knowledge of precautions, Decreased range of motion, Decreased strength, Increased fascial restricitons  Visit Diagnosis: Cervicalgia  Chronic bilateral low back pain without sciatica  Other abnormalities of  gait and mobility     Problem List Patient Active Problem List   Diagnosis Date Noted   Degenerative cervical disc 07/07/2021   Coronary artery disease involving native coronary artery of native heart with unstable angina pectoris [facility])    Coronary artery disease of native artery of native heart with stable angina pectoris ([place])    Pseudoarthrosis of lumbar spine 01/06/2020   Coronary artery calcification seen on CT scan 01/29/2019   Change in bowel habits 06/12/2018   Primary osteoarthritis of right knee 01/06/2017   S/P knee replacement 01/06/2017   Diplopia 07/10/2013   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

## 2021-09-07 ENCOUNTER — Ambulatory Visit (INDEPENDENT_AMBULATORY_CARE_PROVIDER_SITE_OTHER): Payer: Medicare Other | Admitting: Family Medicine

## 2021-09-07 ENCOUNTER — Other Ambulatory Visit: Payer: Self-pay

## 2021-09-07 ENCOUNTER — Encounter: Payer: Self-pay | Admitting: Family Medicine

## 2021-09-07 DIAGNOSIS — I2510 Atherosclerotic heart disease of native coronary artery without angina pectoris: Secondary | ICD-10-CM | POA: Diagnosis not present

## 2021-09-07 DIAGNOSIS — M609 Myositis, unspecified: Secondary | ICD-10-CM | POA: Diagnosis not present

## 2021-09-07 DIAGNOSIS — S32009K Unspecified fracture of unspecified lumbar vertebra, subsequent encounter for fracture with nonunion: Secondary | ICD-10-CM

## 2021-09-07 DIAGNOSIS — E559 Vitamin D deficiency, unspecified: Secondary | ICD-10-CM | POA: Diagnosis not present

## 2021-09-07 DIAGNOSIS — M79606 Pain in leg, unspecified: Secondary | ICD-10-CM | POA: Diagnosis not present

## 2021-09-07 DIAGNOSIS — Z79899 Other long term (current) drug therapy: Secondary | ICD-10-CM | POA: Diagnosis not present

## 2021-09-07 DIAGNOSIS — M5030 Other cervical disc degeneration, unspecified cervical region: Secondary | ICD-10-CM | POA: Diagnosis not present

## 2021-09-07 DIAGNOSIS — D649 Anemia, unspecified: Secondary | ICD-10-CM | POA: Diagnosis not present

## 2021-09-07 DIAGNOSIS — M1990 Unspecified osteoarthritis, unspecified site: Secondary | ICD-10-CM | POA: Diagnosis not present

## 2021-09-07 NOTE — Assessment & Plan Note (Signed)
As stated above.  Patient is making progress but it is slow.  Increase activity slowly.  Discussed icing regimen.  Continue to work with formal physical therapy now transition into more bland than aquatic therapy.  Patient will continue to work on the strengthening and they will start to increase some of the cardiovascular aspect.  Follow-up with me again in 6 to 8 weeks

## 2021-09-07 NOTE — Patient Instructions (Addendum)
Good to see you Keep it up  Lets see how land therapy goes  See me again in 7 weeks

## 2021-09-07 NOTE — Assessment & Plan Note (Signed)
Patient is making good progress at this moment.  Patient is still working with formal physical therapy.  We will start transitioning from the aquatic therapy to land therapy.  Do think that it will be significantly more beneficial.  Discussed icing regimen and home exercises.  Increase activity slowly.  Patient's goal is to be able to walk longer distances without fatigue.  Feels her balance has been making improvement.  Follow-up again in 6 to 8 weeks

## 2021-09-08 ENCOUNTER — Telehealth: Payer: Self-pay | Admitting: Internal Medicine

## 2021-09-08 DIAGNOSIS — E785 Hyperlipidemia, unspecified: Secondary | ICD-10-CM

## 2021-09-08 NOTE — Telephone Encounter (Signed)
Pt c/o medication issue:  1. Name of Medication: Rosuvastatin 10 mg - he was on 20 mg, but was stopped about 2 weeks ago  2. How are you currently taking this medication (dosage and times per day)? 1 time a day  3. Are you having a reaction (difficulty breathing--STAT)? no  4. What is your medication issue?  No energy and feels like somebody have beat him- he said he just had his cholesterol checked and it was good

## 2021-09-08 NOTE — Telephone Encounter (Signed)
Per last ov note w Laurann Montana, NP:  HLD, LDL goal less than 70 - 03/31/21 LDL 52, triglycerides 88. Tolerated Rosuvastatin 5mg  QD. Was increased to 20mg  QD during admission which made him fatigue and caused headache. He had 1 week statin holiday and has resumed at 10mg  QOD. Still notes some fatigue on days he takes it. Agreeable to trial for 2 weeks. If symptoms do not improve, plan to reduce to 5mg  QD and discuss with pharmacy team whether Zetia is indicated for additional cardioprotective benefit.   Will route to Dr. Harrington Challenger and PharmD.

## 2021-09-09 ENCOUNTER — Other Ambulatory Visit: Payer: Self-pay

## 2021-09-09 ENCOUNTER — Encounter (HOSPITAL_BASED_OUTPATIENT_CLINIC_OR_DEPARTMENT_OTHER): Payer: Self-pay | Admitting: Physical Therapy

## 2021-09-09 ENCOUNTER — Ambulatory Visit (HOSPITAL_BASED_OUTPATIENT_CLINIC_OR_DEPARTMENT_OTHER): Payer: Medicare Other | Admitting: Physical Therapy

## 2021-09-09 DIAGNOSIS — M542 Cervicalgia: Secondary | ICD-10-CM | POA: Diagnosis not present

## 2021-09-09 DIAGNOSIS — R2689 Other abnormalities of gait and mobility: Secondary | ICD-10-CM

## 2021-09-09 DIAGNOSIS — G8929 Other chronic pain: Secondary | ICD-10-CM

## 2021-09-09 DIAGNOSIS — M545 Low back pain, unspecified: Secondary | ICD-10-CM | POA: Diagnosis not present

## 2021-09-09 MED ORDER — ROSUVASTATIN CALCIUM 5 MG PO TABS: 5.0000 mg | ORAL_TABLET | Freq: Every day | ORAL | 3 refills | Status: DC

## 2021-09-09 NOTE — Patient Instructions (Signed)
Provide and review at next session  Access Code: [email or web address] URL: [email or web address] Date: 09/09/2021 Prepared by: Daleen Bo  Exercises Standing Hip Abduction Adduction at [facility] - 1 x daily - 2-3 x weekly - 3 sets - 10 reps Lateral Stepping at [place] - 1 x daily - 2-3 x weekly - 1 sets - 6 reps Forward and Backward Stepping at [employer] - 1 x daily - 2-3 x weekly - 1 sets - 6 reps Lunge to Target at [employer] - 1 x daily - 2-3 x weekly - 2 sets - 10 reps Single Leg Stance at [place] Wall - 1 x daily - 2-3 x weekly - 1 sets - 3 reps - 30 hold Goblet Squat with Kettlebell - 1 x daily - 2-3 x weekly - 3 sets - 10 reps

## 2021-09-09 NOTE — Therapy (Signed)
[facility] [address] [place], [place], [address] Phone: [phone]   Fax:  [phone]  Physical Therapy Treatment  Patient Details  Name: [name] MRN: [id number] Date of Birth: 1946/10/11 Referring Provider (PT): Hulan Saas DO   Encounter Date: 09/09/2021   PT End of Session - 09/09/21 0937     Visit Number 13    Number of Visits 17    Date for PT Re-Evaluation 11/30/21    Authorization Type medicare    PT Start Time 0933    PT Stop Time 7867    PT Time Calculation (min) 42 min    Activity Tolerance Patient tolerated treatment well    Behavior During Therapy [facility] for tasks assessed/performed             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   Paroxysmal atrial flutter ([place])     Past Surgical History:  Procedure Laterality Date   ABDOMINAL EXPOSURE N/A 01/06/2020   Procedure: ABDOMINAL EXPOSURE;  Surgeon: Rosetta Posner, MD;  [facility];  Service: Vascular;  Laterality: N/A;   ANTERIOR LUMBAR FUSION N/A 01/06/2020   Procedure: Lumbar Five- Sacral One  Anterior lumbar interbody fusion with infuse;  Surgeon: Kristeen Miss, MD;  Location: [place];  Service: Neurosurgery;  Laterality: N/A;  Lumbar Five- Sacral One  Anterior lumbar interbody fusion with infuse   CERVICAL FUSION  1996, 1997   2 surgery- skull to C7   COLON RESECTION  2007   COLONOSCOPY W/ POLYPECTOMY     CORONARY ATHERECTOMY N/A 07/01/2021   Procedure: CORONARY ATHERECTOMY;  Surgeon: Burnell Blanks, MD;   Location: [place] [facility];  Service: Cardiovascular;  Laterality: N/A;   CORONARY STENT INTERVENTION N/A 07/01/2021   Procedure: CORONARY STENT INTERVENTION;  Surgeon: Burnell Blanks, MD;  Location: [place] [facility];  Service: Cardiovascular;  Laterality: N/A;   HEMORRHOID SURGERY N/A 01/13/2016   Procedure: HEMORRHOIDECTOMY AND REMOVAL OF ANAL SKIN TAG;  Surgeon: Coralie Keens, MD;  Location: [place];  Service: General;  Laterality: N/A;   INTRAVASCULAR PRESSURE WIRE/FFR STUDY N/A 06/18/2021   Procedure: INTRAVASCULAR PRESSURE WIRE/FFR STUDY;  Surgeon: Burnell Blanks, MD;  Location: [place] [facility];  Service: Cardiovascular;  Laterality: N/A;   INTRAVASCULAR ULTRASOUND/IVUS N/A 07/01/2021   Procedure: Intravascular Ultrasound/IVUS;  Surgeon: Burnell Blanks, MD;  Location: [place] [facility];  Service: Cardiovascular;  Laterality: N/A;   REPLACEMENT TOTAL KNEE Left 2005   left   RIGHT/LEFT HEART CATH AND CORONARY ANGIOGRAPHY N/A 06/18/2021   Procedure: RIGHT/LEFT HEART CATH AND CORONARY ANGIOGRAPHY;  Surgeon: Burnell Blanks, MD;  Location: [place] [facility];  Service: Cardiovascular;  Laterality: N/A;   SYNOVIAL CYST EXCISION     lumbar spine   TONSILLECTOMY     TOTAL KNEE ARTHROPLASTY Right 01/06/2017   Procedure: RIGHT TOTAL KNEE ARTHROPLASTY;  Surgeon: Sydnee Cabal, MD;  [facility];  Service: Orthopedics;  Laterality: Right;  Adductior Block  There were no vitals filed for this visit.   Subjective Assessment - 09/09/21 0935     Subjective Pt states he is doing well. He notes that MD notice that he has improved since his last visit.    Pertinent History skull through C7 fusion, 8 surgeries, L3-S1 fusion done twice.    Limitations Sitting;Lifting;Standing;Walking    How long can you sit comfortably? limited must continue to move.    How long can you stand comfortably? limited due to pain. <5    How long can you walk comfortably? limited  2-3 mins before pain starts. cannot walk due to pain. at most across parking lot.    Diagnostic tests CT of the lumbar spine taken in August 2020 was independently visualized by me.  Patient did unfortunately have an L5-S1 fusion and it appeared that there was loosening of the S1 screws bilaterally right greater than left.  Patient has also had a past medical history for a L3-L5 fusion noted.  Patient did have severe left-sided L2-L3 narrowing with moderate central canal stenosis.  Intra surgical imaging from January 2021 showed an anterior fusion at L5-S1 and interbody fusion that was loose seem to be removed.    Patient Stated Goals get out and walk, 1 mile and back. [place] park and wants to walk. wants to improve stregnth of LE in order to ambulate safely especially steps and stairs. (can only go up 3-5 steps with fatigue with hand rail),    Currently in Pain? Yes    Pain Score 5     Pain Orientation Right;Left    Pain Descriptors / Indicators Aching;Dull    Pain Score 5    Pain Location Neck    Pain Orientation Left;Right    Pain Descriptors / Indicators Aching;Dull                Pt seen for aquatic therapy today.  Treatment took place in water 3.25-4 ft in depth at the [employer] pool. Temp of water was 94.  Pt entered/exited the pool via stairs (alternating) independently with bilat rail.   Warm up: with foam board resistance- sidestepping, retro walking,  Hip opening walk; long strides 4x each   Exercises: standing split squat-shallow end 2x10;  board rotation in half squat 20x,  standing march with 5lb ankle weights 20x,  standing hip ABD with 5 lb ankle weights 2x10,  minisquat without UE support 2x10 10lb,  fwd and lateral step up at bottom step 5lb ankle weights 2x10,    Black banded hip extension pull through 2x10       Pt requires buoyancy for support and to offload joints with strengthening exercises. Viscosity of the water is needed for resistance of  strengthening; water current perturbations provides challenge to standing balance unsupported, requiring increased core activation.               PT Short Term Goals - 08/26/21 1239       PT SHORT TERM GOAL #1   Title Patient will report increase knee extension strength to 4/5 bilaterally.    Baseline 4-/5    Time 3    Period Weeks    Status Achieved    Target Date 08/10/21      PT SHORT TERM GOAL #2   Title Patient will report decrease pain by 30%.    Baseline 5/10 neck, 5/10 lower back    Time 3    Period Weeks    Status On-going  Target Date 08/10/21      PT SHORT TERM GOAL #3   Title Patient will report increase in 5x sit to stand by 3 seconds    Time 4    Period Weeks    Status Partially Met    Target Date 08/17/21               PT Long Term Goals - 08/26/21 1239       PT LONG TERM GOAL #1   Title Patient will improve lower extremity strenght to 5/5 throughout in order to ambulate 10 steps with decreased fatigue.    Baseline 3 steps with fatigue    Time 6    Period Weeks    Status On-going      PT LONG TERM GOAL #2   Title Patient will improve distance by 400 feet in 6 minute walk test in order walk ambulate 2 miles around community park.    Time 6    Period Weeks    Status On-going      PT LONG TERM GOAL #3   Title Patient will be independent with land and aquatic HEP in order to perform safely at home.    Baseline inital HEP    Time 6    Period Weeks    Status On-going                   Plan - 09/09/21 9323     Clinical Impression Statement Pt with good tolerance to final aquatic session. Heavier lumbopelvic strength and stabilty exercises were introdcued wihtou increased pain. Pt did find increased fatigue with direct hip extension strengthening and decreased rest intervals. Plan to provided aquatic HEP for independent performance and transition to land for gym based strengthening. Pt has signficantly improved mobility and  tolerance to CKC activity in reduced gravity environment. With improved functional capacity, pt is likely able to tolerate gym based exercises without pain. Pt would benefit from continued skilled therapy in order to reach goals and maximize functional LE lumbopelvic strength for prevention of further functional decline.    Personal Factors and Comorbidities Fitness;Time since onset of injury/illness/exacerbation;Comorbidity 3+;Past/Current Experience    Comorbidities stent palcement, bilateral knee replacements; complete cervical fusion    Examination-Activity Limitations Squat;Stairs;Stand;Locomotion Level;Transfers    Stability/Clinical Decision Making Evolving/Moderate complexity    Rehab Potential Good    PT Frequency 2x / week    PT Duration 6 weeks    PT Treatment/Interventions ADLs/Self Care Home Management;Cryotherapy;Electrical Stimulation;Iontophoresis 29m/ml Dexamethasone;Moist Heat;Traction;Ultrasound;DME Instruction;Gait training;Stair training;Functional mobility training;Therapeutic activities;Therapeutic exercise;Neuromuscular re-education;Patient/family education;Manual techniques;Passive range of motion;Taping    PT Next Visit Plan 6MWT on land, gym based strength training    PT Home Exercise Plan Red Band: seated hip abduction 3x10, seated hip march 3x10, seated knee extension 3x10    Consulted and Agree with Plan of Care Patient             Patient will benefit from skilled therapeutic intervention in order to improve the following deficits and impairments:  Abnormal gait, Decreased endurance, Decreased mobility, Difficulty walking, Increased muscle spasms, Pain, Decreased activity tolerance, Decreased knowledge of precautions, Decreased range of motion, Decreased strength, Increased fascial restricitons  Visit Diagnosis: Cervicalgia  Chronic bilateral low back pain without sciatica  Other abnormalities of gait and mobility     Problem List Patient Active Problem  List   Diagnosis Date Noted   Degenerative cervical disc 07/07/2021   Coronary artery disease involving native coronary artery of native  heart with unstable angina pectoris [facility])    Coronary artery disease of native artery of native heart with stable angina pectoris ([place])    Pseudoarthrosis of lumbar spine 01/06/2020   Coronary artery calcification seen on CT scan 01/29/2019   Change in bowel habits 06/12/2018   Primary osteoarthritis of right knee 01/06/2017   S/P knee replacement 01/06/2017   Diplopia 07/10/2013   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

## 2021-09-09 NOTE — Telephone Encounter (Signed)
Pt reports his son in law who is a physician had him get some blood drawn and his B12 was really low and he has started on injections.  He feels a lot better but still achy from Crestor.  I asked him to bring a copy of all his labs to the office to be scanned into his chart.   He is aware to stop Crestor for 2 weeks and will restart at 5 mg daily and come in for lipids in 4 weeks.

## 2021-09-09 NOTE — Telephone Encounter (Signed)
I agree with [name]'s plan. Have pt take statin holiday. Then resume rosuvastatin at 5mg  daily. Recheck lipids 4 weeks later. If LDL is high than consider alternative such as ezetimibe.

## 2021-09-09 NOTE — Telephone Encounter (Signed)
Patient calling back to follow up.

## 2021-09-13 ENCOUNTER — Other Ambulatory Visit: Payer: Self-pay | Admitting: Cardiovascular Disease

## 2021-09-14 ENCOUNTER — Ambulatory Visit (HOSPITAL_BASED_OUTPATIENT_CLINIC_OR_DEPARTMENT_OTHER): Payer: Medicare Other | Admitting: Physical Therapy

## 2021-09-14 ENCOUNTER — Encounter (HOSPITAL_BASED_OUTPATIENT_CLINIC_OR_DEPARTMENT_OTHER): Payer: Self-pay | Admitting: Physical Therapy

## 2021-09-14 ENCOUNTER — Telehealth: Payer: Self-pay | Admitting: Internal Medicine

## 2021-09-14 ENCOUNTER — Other Ambulatory Visit: Payer: Self-pay

## 2021-09-14 DIAGNOSIS — R2689 Other abnormalities of gait and mobility: Secondary | ICD-10-CM

## 2021-09-14 DIAGNOSIS — M542 Cervicalgia: Secondary | ICD-10-CM

## 2021-09-14 DIAGNOSIS — G8929 Other chronic pain: Secondary | ICD-10-CM | POA: Diagnosis not present

## 2021-09-14 DIAGNOSIS — M545 Low back pain, unspecified: Secondary | ICD-10-CM | POA: Diagnosis not present

## 2021-09-14 NOTE — Telephone Encounter (Signed)
Pt c/o medication issue:  1. Name of Medication: clopidogrel (PLAVIX) 75 MG tablet  2. How are you currently taking this medication (dosage and times per day)?    3. Are you having a reaction (difficulty breathing--STAT)? no  4. What is your medication issue? He needs a new prescription sent to his pharmacy that says one pill a day instead of  8. It was only for 8 because of he procedure. His insurance wont cover the prescription that out now. Please advise

## 2021-09-14 NOTE — Telephone Encounter (Signed)
Returned pt call. Reports that previous Rx (first one) sent in was for loading dose + daily regimen. States that insurance won't pay for Rx that still reflects loading dose. Aware Rx changed to once daily dosing and Rx sent. Pt appreciative of help and correcting the new Rx.

## 2021-09-15 ENCOUNTER — Encounter (HOSPITAL_BASED_OUTPATIENT_CLINIC_OR_DEPARTMENT_OTHER): Payer: Self-pay | Admitting: Physical Therapy

## 2021-09-15 NOTE — Therapy (Signed)
[facility] [address] [place], [place], [address] Phone: [phone]   Fax:  [phone]  Physical Therapy Treatment  Patient Details  Name: [name] MRN: [id number] Date of Birth: 14-Jan-1946 Referring Provider (PT): Hulan Saas DO   Encounter Date: 09/14/2021   PT End of Session - 09/15/21 1527     Visit Number 14    Number of Visits 17    Date for PT Re-Evaluation 11/30/21    Authorization Type medicare    PT Start Time 0930    PT Stop Time 1013    PT Time Calculation (min) 43 min    Activity Tolerance Patient tolerated treatment well    Behavior During Therapy 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   Paroxysmal atrial flutter ([place])     Past Surgical History:  Procedure Laterality Date   ABDOMINAL EXPOSURE N/A 01/06/2020   Procedure: ABDOMINAL EXPOSURE;  Surgeon: Rosetta Posner, MD;  [facility];  Service: Vascular;  Laterality: N/A;   ANTERIOR LUMBAR FUSION N/A 01/06/2020   Procedure: Lumbar Five- Sacral One  Anterior lumbar interbody fusion with infuse;  Surgeon: Kristeen Miss, MD;  Location: [place];  Service: Neurosurgery;  Laterality: N/A;  Lumbar Five- Sacral One  Anterior lumbar interbody fusion with infuse   CERVICAL FUSION  1996, 1997   2 surgery- skull to C7   COLON RESECTION  2007   COLONOSCOPY W/ POLYPECTOMY     CORONARY ATHERECTOMY N/A 07/01/2021   Procedure: CORONARY ATHERECTOMY;  Surgeon: Burnell Blanks, MD;   Location: [place] CV LAB;  Service: Cardiovascular;  Laterality: N/A;   CORONARY STENT INTERVENTION N/A 07/01/2021   Procedure: CORONARY STENT INTERVENTION;  Surgeon: Burnell Blanks, MD;  Location: [place] [facility];  Service: Cardiovascular;  Laterality: N/A;   HEMORRHOID SURGERY N/A 01/13/2016   Procedure: HEMORRHOIDECTOMY AND REMOVAL OF ANAL SKIN TAG;  Surgeon: Coralie Keens, MD;  Location: [facility];  Service: General;  Laterality: N/A;   INTRAVASCULAR PRESSURE WIRE/FFR STUDY N/A 06/18/2021   Procedure: INTRAVASCULAR PRESSURE WIRE/FFR STUDY;  Surgeon: Burnell Blanks, MD;  Location: [place] [facility];  Service: Cardiovascular;  Laterality: N/A;   INTRAVASCULAR ULTRASOUND/IVUS N/A 07/01/2021   Procedure: Intravascular Ultrasound/IVUS;  Surgeon: Burnell Blanks, MD;  Location: [place] [facility];  Service: Cardiovascular;  Laterality: N/A;   REPLACEMENT TOTAL KNEE Left 2005   left   RIGHT/LEFT HEART CATH AND CORONARY ANGIOGRAPHY N/A 06/18/2021   Procedure: RIGHT/LEFT HEART CATH AND CORONARY ANGIOGRAPHY;  Surgeon: Burnell Blanks, MD;  Location: [place] [facility];  Service: Cardiovascular;  Laterality: N/A;   SYNOVIAL CYST EXCISION     lumbar spine   TONSILLECTOMY     TOTAL KNEE ARTHROPLASTY Right 01/06/2017   Procedure: RIGHT TOTAL KNEE ARTHROPLASTY;  Surgeon: Sydnee Cabal, MD;  [facility];  Service: Orthopedics;  Laterality: Right;  Adductior Block  There were no vitals filed for this visit.   Subjective Assessment - 09/15/21 1524     Subjective Patient to be seen on land today. He would like to work on gym exercises.    Pertinent History skull through C7 fusion, 8 surgeries, L3-S1 fusion done twice.    Limitations Sitting;Lifting;Standing;Walking    How long can you sit comfortably? limited must continue to move.    How long can you stand comfortably? limited due to pain. <5    How long can you walk comfortably? limited 2-3 mins before pain  starts. cannot walk due to pain. at most across parking lot.    Diagnostic tests CT of the lumbar spine taken in August 2020 was independently visualized by me.  Patient did unfortunately have an L5-S1 fusion and it appeared that there was loosening of the S1 screws bilaterally right greater than left.  Patient has also had a past medical history for a L3-L5 fusion noted.  Patient did have severe left-sided L2-L3 narrowing with moderate central canal stenosis.  Intra surgical imaging from January 2021 showed an anterior fusion at L5-S1 and interbody fusion that was loose seem to be removed.    Patient Stated Goals get out and walk, 1 mile and back. [place] and wants to walk. wants to improve stregnth of LE in order to ambulate safely especially steps and stairs. (can only go up 3-5 steps with fatigue with hand rail),    Currently in Pain? Yes    Pain Score 5     Pain Location Back    Pain Orientation Right;Left    Pain Descriptors / Indicators Aching    Pain Type Chronic pain    Pain Onset More than a month ago    Pain Frequency Constant    Aggravating Factors  standing and walking    Pain Relieving Factors medication and rest    Effect of Pain on Daily Activities difficulty walking for long periods of time                               [facility] Adult PT Treatment/Exercise - 09/15/21 0001       Knee/Hip Exercises: Aerobic   Nustep L3 354 steps in 4 min      Shoulder Exercises: Power Development worker, community Limitations 3x10 15 lbs    Row Limitations 3x10 15 lbs    Other Power [employer] Exercises chops 2x10 10 lbs    Other Power Dietitian press 2x10 bilateral                     PT Education - 09/15/21 1525     Education Details HEP and symptom management    Person(s) Educated Patient    Methods Explanation;Demonstration;Tactile cues;Verbal cues    Comprehension Verbalized understanding;Returned demonstration;Tactile cues required;Verbal cues  required              PT Short Term Goals - 08/26/21 1239       PT SHORT TERM GOAL #1   Title Patient will report increase knee extension strength to 4/5 bilaterally.    Baseline 4-/5    Time 3    Period Weeks    Status Achieved    Target Date 08/10/21      PT SHORT TERM GOAL #2   Title Patient will report decrease pain by 30%.    Baseline 5/10 neck, 5/10 lower back  Time 3    Period Weeks    Status On-going    Target Date 08/10/21      PT SHORT TERM GOAL #3   Title Patient will report increase in 5x sit to stand by 3 seconds    Time 4    Period Weeks    Status Partially Met    Target Date 08/17/21               PT Long Term Goals - 08/26/21 1239       PT LONG TERM GOAL #1   Title Patient will improve lower extremity strenght to 5/5 throughout in order to ambulate 10 steps with decreased fatigue.    Baseline 3 steps with fatigue    Time 6    Period Weeks    Status On-going      PT LONG TERM GOAL #2   Title Patient will improve distance by 400 feet in 6 minute walk test in order walk ambulate [place] miles around community park.    Time 6    Period Weeks    Status On-going      PT LONG TERM GOAL #3   Title Patient will be independent with land and aquatic HEP in order to perform safely at home.    Baseline inital HEP    Time 6    Period Weeks    Status On-going                   Plan - 09/14/21 1012     Clinical Impression Statement Therapy brought patient into the gym today. Therspy reviewed the use of the nu-step and bike. Therapy reviewed how to set distance, resistane, and time goal and how to progress. Therapy reviewed a series of cable exercises with he reported he can really feel them.  He lans on getting a gy mebership at some time. He was advised to be patient and that strengthening would take a long period of time. Therapy also educated him on his transverse abdoial muscles and how they engage. Therapy will continue to progress as  tolerated.    Personal Factors and Comorbidities Fitness;Time since onset of injury/illness/exacerbation;Comorbidity 3+;Past/Current Experience    Comorbidities stent palcement, bilateral knee replacements; complete cervical fusion    Examination-Activity Limitations Squat;Stairs;Stand;Locomotion Level;Transfers    Examination-Participation Restrictions Community Activity;Shop;Meal Prep;Cleaning;Yard Work    Merchant navy officer Evolving/Moderate complexity    Clinical Decision Making Moderate    Rehab Potential Good    PT Frequency 2x / week    PT Duration 6 weeks    PT Treatment/Interventions ADLs/Self Care Home Management;Cryotherapy;Electrical Stimulation;Iontophoresis 5m/ml Dexamethasone;Moist Heat;Traction;Ultrasound;DME Instruction;Gait training;Stair training;Functional mobility training;Therapeutic activities;Therapeutic exercise;Neuromuscular re-education;Patient/family education;Manual techniques;Passive range of motion;Taping    PT Next Visit Plan 6MWT on land, gym based strength training    PT Home Exercise Plan Red Band: seated hip abduction 3x10, seated hip march 3x10, seated knee extension 3x10    Consulted and Agree with Plan of Care Patient             Patient will benefit from skilled therapeutic intervention in order to improve the following deficits and impairments:  Abnormal gait, Decreased endurance, Decreased mobility, Difficulty walking, Increased muscle spasms, Pain, Decreased activity tolerance, Decreased knowledge of precautions, Decreased range of motion, Decreased strength, Increased fascial restricitons  Visit Diagnosis: Cervicalgia  Chronic bilateral low back pain without sciatica  Other abnormalities of gait and mobility     Problem List Patient Active Problem List  Diagnosis Date Noted   Degenerative cervical disc 07/07/2021   Coronary artery disease involving native coronary artery of native heart with unstable angina pectoris  [facility])    Coronary artery disease of native artery of native heart with stable angina pectoris ([facility])    Pseudoarthrosis of lumbar spine 01/06/2020   Coronary artery calcification seen on CT scan 01/29/2019   Change in bowel habits 06/12/2018   Primary osteoarthritis of right knee 01/06/2017   S/P knee replacement 01/06/2017   Diplopia 07/10/2013   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

## 2021-09-16 ENCOUNTER — Other Ambulatory Visit: Payer: Self-pay

## 2021-09-16 ENCOUNTER — Ambulatory Visit (HOSPITAL_BASED_OUTPATIENT_CLINIC_OR_DEPARTMENT_OTHER): Payer: Medicare Other | Admitting: Physical Therapy

## 2021-09-16 DIAGNOSIS — R2689 Other abnormalities of gait and mobility: Secondary | ICD-10-CM

## 2021-09-16 DIAGNOSIS — M545 Low back pain, unspecified: Secondary | ICD-10-CM | POA: Diagnosis not present

## 2021-09-16 DIAGNOSIS — M542 Cervicalgia: Secondary | ICD-10-CM | POA: Diagnosis not present

## 2021-09-16 DIAGNOSIS — G8929 Other chronic pain: Secondary | ICD-10-CM | POA: Diagnosis not present

## 2021-09-17 ENCOUNTER — Encounter (HOSPITAL_BASED_OUTPATIENT_CLINIC_OR_DEPARTMENT_OTHER): Payer: Self-pay | Admitting: Physical Therapy

## 2021-09-17 NOTE — Therapy (Addendum)
[facility] [address] [place], [place], [address] Phone: [phone]   Fax:  [phone]  Physical Therapy Treatment  Patient Details  Name: NITIN MCKOWEN MRN: [id number] Date of Birth: 1946-12-06 Referring Provider (PT): Hulan Saas DO   Encounter Date: 09/16/2021   PT End of Session - 09/17/21 1032     Visit Number 15    Number of Visits 17    Date for PT Re-Evaluation 11/30/21    Authorization Type medicare    PT Start Time 1015    PT Stop Time 1057    PT Time Calculation (min) 42 min    Activity Tolerance Patient tolerated treatment well    Behavior During Therapy [facility] for tasks assessed/performed             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   Paroxysmal atrial flutter ([place])     Past Surgical History:  Procedure Laterality Date   ABDOMINAL EXPOSURE N/A 01/06/2020   Procedure: ABDOMINAL EXPOSURE;  Surgeon: Rosetta Posner, MD;  [facility];  Service: Vascular;  Laterality: N/A;   ANTERIOR LUMBAR FUSION N/A 01/06/2020   Procedure: Lumbar Five- Sacral One  Anterior lumbar interbody fusion with infuse;  Surgeon: Kristeen Miss, MD;  Location: [facility];  Service: Neurosurgery;  Laterality: N/A;  Lumbar Five- Sacral One  Anterior lumbar interbody fusion with infuse   CERVICAL FUSION  1996, 1997   2 surgery- skull to C7   COLON RESECTION  2007   COLONOSCOPY W/ POLYPECTOMY     CORONARY ATHERECTOMY N/A 07/01/2021   Procedure: CORONARY ATHERECTOMY;  Surgeon: Burnell Blanks, MD;   Location: [facility];  Service: Cardiovascular;  Laterality: N/A;   CORONARY STENT INTERVENTION N/A 07/01/2021   Procedure: CORONARY STENT INTERVENTION;  Surgeon: Burnell Blanks, MD;  Location: [facility];  Service: Cardiovascular;  Laterality: N/A;   HEMORRHOID SURGERY N/A 01/13/2016   Procedure: HEMORRHOIDECTOMY AND REMOVAL OF ANAL SKIN TAG;  Surgeon: Coralie Keens, MD;  Location: [facility];  Service: General;  Laterality: N/A;   INTRAVASCULAR PRESSURE WIRE/FFR STUDY N/A 06/18/2021   Procedure: INTRAVASCULAR PRESSURE WIRE/FFR STUDY;  Surgeon: Burnell Blanks, MD;  Location: [facility];  Service: Cardiovascular;  Laterality: N/A;   INTRAVASCULAR ULTRASOUND/IVUS N/A 07/01/2021   Procedure: Intravascular Ultrasound/IVUS;  Surgeon: Burnell Blanks, MD;  Location: [facility];  Service: Cardiovascular;  Laterality: N/A;   REPLACEMENT TOTAL KNEE Left 2005   left   RIGHT/LEFT HEART CATH AND CORONARY ANGIOGRAPHY N/A 06/18/2021   Procedure: RIGHT/LEFT HEART CATH AND CORONARY ANGIOGRAPHY;  Surgeon: Burnell Blanks, MD;  Location: [facility];  Service: Cardiovascular;  Laterality: N/A;   SYNOVIAL CYST EXCISION     lumbar spine   TONSILLECTOMY     TOTAL KNEE ARTHROPLASTY Right 01/06/2017   Procedure: RIGHT TOTAL KNEE ARTHROPLASTY;  Surgeon: Sydnee Cabal, MD;  [facility];  Service: Orthopedics;  Laterality: Right;  Adductior Block  There were no vitals filed for this visit.   Subjective Assessment - 09/17/21 1027     Subjective The patient reports he feels like he can stand a little straighter and get up and down a little eaiser. He did get sore after the last visit.    Pertinent History skull through C7 fusion, 8 surgeries, L3-S1 fusion done twice.    Limitations Sitting;Lifting;Standing;Walking    How long can you sit comfortably? limited must continue to move.    How long can you stand comfortably? limited due to pain. <5     How long can you walk comfortably? limited 2-3 mins before pain starts. cannot walk due to pain. at most across parking lot.    Diagnostic tests CT of the lumbar spine taken in August 2020 was independently visualized by me.  Patient did unfortunately have an L5-S1 fusion and it appeared that there was loosening of the S1 screws bilaterally right greater than left.  Patient has also had a past medical history for a L3-L5 fusion noted.  Patient did have severe left-sided L2-L3 narrowing with moderate central canal stenosis.  Intra surgical imaging from January 2021 showed an anterior fusion at L5-S1 and interbody fusion that was loose seem to be removed.    Patient Stated Goals get out and walk, 1 mile and back. [place] and wants to walk. wants to improve stregnth of LE in order to ambulate safely especially steps and stairs. (can only go up 3-5 steps with fatigue with hand rail),    Currently in Pain? Yes    Pain Score 5     Pain Location Back    Pain Orientation Left;Right    Pain Descriptors / Indicators Aching    Pain Type Chronic pain    Pain Onset More than a month ago    Pain Frequency Constant    Aggravating Factors  standing and walking    Pain Relieving Factors medication and rest    Effect of Pain on Daily Activities difficulty walking for long periods    Multiple Pain Sites No                Todays treatment:   Manual : side lying trigger point release to left and right lower lumbar paraspinals and upper gluteal area.   There-ex: leg press 3x10 50 lbs  Hip abduction machine 3x10 30 lbs  Knee extension machine 3x10 30lbs  Nu-steps L3 5 min                          PT Education - 09/17/21 1029     Education Details reviewed HEP and symptom managment    Person(s) Educated Patient    Methods Explanation;Demonstration;Tactile cues;Verbal cues    Comprehension Verbalized understanding;Returned demonstration;Verbal cues required;Tactile cues  required              PT Short Term Goals - 08/26/21 1239       PT SHORT TERM GOAL #1   Title Patient will report increase knee extension strength to 4/5 bilaterally.    Baseline 4-/5    Time 3    Period Weeks    Status Achieved    Target Date 08/10/21      PT SHORT TERM GOAL #2   Title Patient will report decrease pain by 30%.    Baseline 5/10 neck, 5/10 lower back    Time 3    Period Weeks    Status  On-going    Target Date 08/10/21      PT SHORT TERM GOAL #3   Title Patient will report increase in 5x sit to stand by 3 seconds    Time 4    Period Weeks    Status Partially Met    Target Date 08/17/21               PT Long Term Goals - 08/26/21 1239       PT LONG TERM GOAL #1   Title Patient will improve lower extremity strenght to 5/5 throughout in order to ambulate 10 steps with decreased fatigue.    Baseline 3 steps with fatigue    Time 6    Period Weeks    Status On-going      PT LONG TERM GOAL #2   Title Patient will improve distance by 400 feet in 6 minute walk test in order walk ambulate [place] miles [place].    Time 6    Period Weeks    Status On-going      PT LONG TERM GOAL #3   Title Patient will be independent with land and aquatic HEP in order to perform safely at home.    Baseline inital HEP    Time 6    Period Weeks    Status On-going                   Plan - 09/17/21 1034     Clinical Impression Statement Patient tolerated LE strengthening well. He reported some fatigue but no pain. He feels like he really worked it well. He is motivated to continue with his gym program. Therapy also perfromed manual therapy to his sore spot. He reported it felt a little better with manual therapy.    Personal Factors and Comorbidities Fitness;Time since onset of injury/illness/exacerbation;Comorbidity 3+;Past/Current Experience    Comorbidities stent palcement, bilateral knee replacements; complete cervical fusion     Examination-Activity Limitations Squat;Stairs;Stand;Locomotion Level;Transfers    Examination-Participation Restrictions Community Activity;Shop;Meal Prep;Cleaning;Yard Work    [employer]chant [employer] officer Evolving/Moderate complexity    Clinical Decision Making Moderate    Rehab Potential Good    PT Frequency 2x / week    PT Duration 6 weeks    PT Treatment/Interventions ADLs/Self Care Home Management;Cryotherapy;Electrical Stimulation;Iontophoresis 100m/ml Dexamethasone;Moist Heat;Traction;Ultrasound;DME Instruction;Gait training;Stair training;Functional mobility training;Therapeutic activities;Therapeutic exercise;Neuromuscular re-education;Patient/family education;Manual techniques;Passive range of motion;Taping    PT Next Visit Plan 6MWT on land, gym based strength training    PT Home Exercise Plan Red Band: seated hip abduction 3x10, seated hip march 3x10, seated knee extension 3x10    Consulted and Agree with Plan of Care Patient             Patient will benefit from skilled therapeutic intervention in order to improve the following deficits and impairments:  Abnormal gait, Decreased endurance, Decreased mobility, Difficulty walking, Increased muscle spasms, Pain, Decreased activity tolerance, Decreased knowledge of precautions, Decreased range of motion, Decreased strength, Increased fascial restricitons  Visit Diagnosis: Cervicalgia  Chronic bilateral low back pain without sciatica  Other abnormalities of gait and mobility     Problem List Patient Active Problem List   Diagnosis Date Noted   Degenerative cervical disc 07/07/2021   Coronary artery disease involving native coronary artery of native heart with unstable angina pectoris [place]    Coronary artery disease of native artery of native heart with stable angina pectoris ([facility])    Pseudoarthrosis of lumbar spine 01/06/2020   Coronary artery  calcification seen on CT scan 01/29/2019   Change in bowel habits  06/12/2018   Primary osteoarthritis of right knee 01/06/2017   S/P knee replacement 01/06/2017   Diplopia 07/10/2013   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

## 2021-09-21 ENCOUNTER — Other Ambulatory Visit: Payer: Self-pay

## 2021-09-21 ENCOUNTER — Ambulatory Visit (HOSPITAL_BASED_OUTPATIENT_CLINIC_OR_DEPARTMENT_OTHER): Payer: Medicare Other | Attending: Family Medicine | Admitting: Physical Therapy

## 2021-09-21 ENCOUNTER — Encounter (HOSPITAL_BASED_OUTPATIENT_CLINIC_OR_DEPARTMENT_OTHER): Payer: Self-pay | Admitting: Physical Therapy

## 2021-09-21 DIAGNOSIS — M542 Cervicalgia: Secondary | ICD-10-CM | POA: Insufficient documentation

## 2021-09-21 DIAGNOSIS — M545 Low back pain, unspecified: Secondary | ICD-10-CM | POA: Insufficient documentation

## 2021-09-21 DIAGNOSIS — R2689 Other abnormalities of gait and mobility: Secondary | ICD-10-CM | POA: Diagnosis not present

## 2021-09-21 DIAGNOSIS — G8929 Other chronic pain: Secondary | ICD-10-CM | POA: Diagnosis not present

## 2021-09-21 NOTE — Therapy (Signed)
[facility] [address] [place], [place], [address] Phone: [phone]   Fax:  [phone]  Physical Therapy Treatment  Patient Details  Name: [name] MRN: [id number] Date of Birth: [birth year] Referring Provider (PT): Hulan Saas DO   Encounter Date: 09/21/2021   PT End of Session - 09/21/21 1131     Visit Number 16    Number of Visits 17    Date for PT Re-Evaluation 11/30/21    Authorization Type medicare    PT Start Time 3254    PT Stop Time 1225    PT Time Calculation (min) 40 min    Activity Tolerance Patient tolerated treatment well    Behavior During Therapy [facility] for tasks assessed/performed             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   Paroxysmal atrial flutter ([place])     Past Surgical History:  Procedure Laterality Date   ABDOMINAL EXPOSURE N/A 01/06/2020   Procedure: ABDOMINAL EXPOSURE;  Surgeon: Rosetta Posner, MD;  [facility];  Service: Vascular;  Laterality: N/A;   ANTERIOR LUMBAR FUSION N/A 01/06/2020   Procedure: Lumbar Five- Sacral One  Anterior lumbar interbody fusion with infuse;  Surgeon: Kristeen Miss, MD;  Location: [facility];  Service: Neurosurgery;  Laterality: N/A;  Lumbar Five- Sacral One  Anterior lumbar interbody fusion with infuse   CERVICAL FUSION  1996, 1997   2 surgery- skull to C7   COLON RESECTION  2007   COLONOSCOPY W/ POLYPECTOMY     CORONARY ATHERECTOMY N/A 07/01/2021   Procedure: CORONARY ATHERECTOMY;  Surgeon: Burnell Blanks, MD;   Location: [facility];  Service: Cardiovascular;  Laterality: N/A;   CORONARY STENT INTERVENTION N/A 07/01/2021   Procedure: CORONARY STENT INTERVENTION;  Surgeon: Burnell Blanks, MD;  Location: [facility];  Service: Cardiovascular;  Laterality: N/A;   HEMORRHOID SURGERY N/A 01/13/2016   Procedure: HEMORRHOIDECTOMY AND REMOVAL OF ANAL SKIN TAG;  Surgeon: Coralie Keens, MD;  Location: [facility];  Service: General;  Laterality: N/A;   INTRAVASCULAR PRESSURE WIRE/FFR STUDY N/A 06/18/2021   Procedure: INTRAVASCULAR PRESSURE WIRE/FFR STUDY;  Surgeon: Burnell Blanks, MD;  Location: [facility];  Service: Cardiovascular;  Laterality: N/A;   INTRAVASCULAR ULTRASOUND/IVUS N/A 07/01/2021   Procedure: Intravascular Ultrasound/IVUS;  Surgeon: Burnell Blanks, MD;  Location: [facility];  Service: Cardiovascular;  Laterality: N/A;   REPLACEMENT TOTAL KNEE Left 2005   left   RIGHT/LEFT HEART CATH AND CORONARY ANGIOGRAPHY N/A 06/18/2021   Procedure: RIGHT/LEFT HEART CATH AND CORONARY ANGIOGRAPHY;  Surgeon: Burnell Blanks, MD;  Location: [facility];  Service: Cardiovascular;  Laterality: N/A;   SYNOVIAL CYST EXCISION     lumbar spine   TONSILLECTOMY     TOTAL KNEE ARTHROPLASTY Right 01/06/2017   Procedure: RIGHT TOTAL KNEE ARTHROPLASTY;  Surgeon: Sydnee Cabal, MD;  [facility];  Service: Orthopedics;  Laterality: Right;  Adductior Block  There were no vitals filed for this visit.   Subjective Assessment - 09/21/21 1133     Subjective Pt states he is doing well. He was not overly sore from last LE training session.    Pertinent History skull through C7 fusion, 8 surgeries, L3-S1 fusion done twice.    Limitations Sitting;Lifting;Standing;Walking    How long can you sit comfortably? limited must continue to move.    How long can you stand comfortably? limited due to pain. <5    How long can you walk comfortably? limited 2-3 mins before  pain starts. cannot walk due to pain. at most across parking lot.    Diagnostic tests CT of the lumbar spine taken in August 2020 was independently visualized by me.  Patient did unfortunately have an L5-S1 fusion and it appeared that there was loosening of the S1 screws bilaterally right greater than left.  Patient has also had a past medical history for a L3-L5 fusion noted.  Patient did have severe left-sided L2-L3 narrowing with moderate central canal stenosis.  Intra surgical imaging from January 2021 showed an anterior fusion at L5-S1 and interbody fusion that was loose seem to be removed.    Patient Stated Goals get out and walk, 1 mile and back. [place] and wants to walk. wants to improve stregnth of LE in order to ambulate safely especially steps and stairs. (can only go up 3-5 steps with fatigue with hand rail),    Currently in Pain? Yes    Pain Score 5     Pain Location Back    Pain Orientation Left;Right    Pain Descriptors / Indicators Aching    Pain Onset More than a month ago    Pain Score 5    Pain Location Neck    Pain Orientation Left;Right    Pain Descriptors / Indicators Dull;Aching    Pain Type Chronic pain                [facility] Adult PT Treatment/Exercise - 09/21/21 0001       Knee/Hip Exercises: Aerobic   Nustep L5 6 min            Seated cable row 2x10 40 lbs  standing cable pull through hip extension 20lbs 2x10 cable chop 15lbs 10x each direction Goblet squat to table 15lb KB 2x10 bird dog 10x elliptical intervals 30s on 47mn off 4x     PT Education - 09/21/21 1238     Education Details anatomy, exercise progression, DOMS expectations, muscle firing, HEP, joint protection    Person(s) Educated Patient    Methods Explanation;Demonstration;Tactile cues;Verbal cues    Comprehension Verbalized understanding;Returned demonstration              PT Short Term Goals - 08/26/21 1239       PT SHORT TERM GOAL #1   Title Patient will  report increase knee extension strength to 4/5 bilaterally.    Baseline 4-/5    Time 3    Period Weeks    Status Achieved    Target Date 08/10/21      PT SHORT TERM GOAL #2   Title Patient will report decrease pain by 30%.    Baseline 5/10 neck, 5/10 lower back    Time 3    Period Weeks    Status On-going    Target Date 08/10/21      PT SHORT TERM GOAL #3   Title Patient will report increase in 5x sit to  stand by 3 seconds    Time 4    Period Weeks    Status Partially Met    Target Date 08/17/21               PT Long Term Goals - 08/26/21 1239       PT LONG TERM GOAL #1   Title Patient will improve lower extremity strenght to 5/5 throughout in order to ambulate 10 steps with decreased fatigue.    Baseline 3 steps with fatigue    Time 6    Period Weeks    Status On-going      PT LONG TERM GOAL #2   Title Patient will improve distance by 400 feet in 6 minute walk test in order walk ambulate [place].    Time 6    Period Weeks    Status On-going      PT LONG TERM GOAL #3   Title Patient will be independent with land and aquatic HEP in order to perform safely at home.    Baseline inital HEP    Time 6    Period Weeks    Status On-going               Plan - 09/21/21 1239     Clinical Impression Statement Pt able to continue with gym based exercises today with increased degrees of freedom and introduce intervals to simulate walking without increase in pain. Pt does have continued fatigue with upright positioning and walking but quickly recovers from "fatigue" feeling within 30s-44mn. Plan to continue with lumbar stability and postural exercise and provide HEP for gym exercise. Pt would benefit from continued skilled therapy in order to reach goals and maximize functional LE lumbopelvic strength for prevention of further functional decline.    Personal Factors and Comorbidities Fitness;Time since onset of  injury/illness/exacerbation;Comorbidity 3+;Past/Current Experience    Comorbidities stent palcement, bilateral knee replacements; complete cervical fusion    Examination-Activity Limitations Squat;Stairs;Stand;Locomotion Level;Transfers    Examination-Participation Restrictions Community Activity;Shop;Meal Prep;Cleaning;Yard Work    SMerchant navy officerEvolving/Moderate complexity    Rehab Potential Good    PT Frequency 2x / week    PT Duration 6 weeks    PT Treatment/Interventions ADLs/Self Care Home Management;Cryotherapy;Electrical Stimulation;Iontophoresis 447mml Dexamethasone;Moist Heat;Traction;Ultrasound;DME Instruction;Gait training;Stair training;Functional mobility training;Therapeutic activities;Therapeutic exercise;Neuromuscular re-education;Patient/family education;Manual techniques;Passive range of motion;Taping    PT Next Visit Plan 6MWT on land, gym based strength training    PT Home Exercise Plan Red Band: seated hip abduction 3x10, seated hip march 3x10, seated knee extension 3x10    Consulted and Agree with Plan of Care Patient             Patient will benefit from skilled therapeutic intervention in order to improve the following deficits and impairments:  Abnormal gait, Decreased endurance, Decreased mobility, Difficulty walking, Increased muscle spasms, Pain, Decreased activity tolerance, Decreased knowledge of precautions, Decreased range of motion, Decreased strength, Increased fascial restricitons  Visit Diagnosis: Cervicalgia  Chronic bilateral low back pain without sciatica  Other abnormalities of gait and mobility     Problem List Patient Active Problem List   Diagnosis Date Noted   Degenerative cervical disc 07/07/2021   Coronary artery disease involving native coronary artery of native heart with unstable angina pectoris ([place]   Coronary artery disease of native artery of native heart with stable angina pectoris ([facility])     Pseudoarthrosis of lumbar spine 01/06/2020   Coronary artery calcification seen on CT scan  01/29/2019   Change in bowel habits 06/12/2018   Primary osteoarthritis of right knee 01/06/2017   S/P knee replacement 01/06/2017   Diplopia 07/10/2013   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 [address] [place], [place], [address] Phone: [phone]   Fax:  [phone]  Name: SILVIA MARKUSON MRN: [id number] Date of Birth: December 12, 1946

## 2021-09-23 ENCOUNTER — Other Ambulatory Visit: Payer: Self-pay

## 2021-09-23 ENCOUNTER — Ambulatory Visit (HOSPITAL_BASED_OUTPATIENT_CLINIC_OR_DEPARTMENT_OTHER): Payer: Medicare Other | Admitting: Physical Therapy

## 2021-09-23 ENCOUNTER — Encounter (HOSPITAL_BASED_OUTPATIENT_CLINIC_OR_DEPARTMENT_OTHER): Payer: Self-pay | Admitting: Physical Therapy

## 2021-09-23 DIAGNOSIS — R2689 Other abnormalities of gait and mobility: Secondary | ICD-10-CM | POA: Diagnosis not present

## 2021-09-23 DIAGNOSIS — M545 Low back pain, unspecified: Secondary | ICD-10-CM

## 2021-09-23 DIAGNOSIS — M542 Cervicalgia: Secondary | ICD-10-CM | POA: Diagnosis not present

## 2021-09-23 DIAGNOSIS — G8929 Other chronic pain: Secondary | ICD-10-CM

## 2021-09-23 NOTE — Patient Instructions (Signed)
Access Code: [email or web address] URL: [email or web address] Date: 09/23/2021 Prepared by: Daleen Bo  Exercises Standing Hip Abduction Adduction at [facility] - 1 x daily - 2-3 x weekly - 3 sets - 10 reps Lateral Stepping at [place] - 1 x daily - 2-3 x weekly - 1 sets - 6 reps Forward and Backward Stepping at [employer] - 1 x daily - 2-3 x weekly - 1 sets - 6 reps Lunge to Target at [employer] - 1 x daily - 2-3 x weekly - 2 sets - 10 reps Single Leg Stance at [place] - 1 x daily - 2-3 x weekly - 1 sets - 3 reps - 30 hold Goblet Squat with Kettlebell - 1 x daily - 2-3 x weekly - 3 sets - 10 reps Bird Dog - 1 x daily - 7 x weekly - 2 sets - 10 reps

## 2021-09-23 NOTE — Therapy (Signed)
[facility] [address] [place], [place], [address] Phone: [phone]   Fax:  [phone]  Physical Therapy Treatment & Re-Certification  Patient Details  Name: [name] MRN: [id number] Date of Birth: 1946/01/14 Referring Provider (PT): Hulan Saas DO   Encounter Date: 09/23/2021   PT End of Session - 09/23/21 1134     Visit Number 1    Number of Visits 9    Date for PT Re-Evaluation 12/22/21    Authorization Type medicare    PT Start Time 1134    PT Stop Time 1220    PT Time Calculation (min) 46 min    Activity Tolerance Patient tolerated treatment well    Behavior During Therapy [facility] for tasks assessed/performed             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   Paroxysmal atrial flutter ([place])     Past Surgical History:  Procedure Laterality Date   ABDOMINAL EXPOSURE N/A 01/06/2020   Procedure: ABDOMINAL EXPOSURE;  Surgeon: Rosetta Posner, MD;  [facility];  Service: Vascular;  Laterality: N/A;   ANTERIOR LUMBAR FUSION N/A 01/06/2020   Procedure: Lumbar Five- Sacral One  Anterior lumbar interbody fusion with infuse;  Surgeon: Kristeen Miss, MD;  Location: [place];  Service: Neurosurgery;  Laterality: N/A;  Lumbar Five- Sacral One  Anterior lumbar interbody fusion with infuse   CERVICAL FUSION  1996, 1997   2 surgery- skull to C7   COLON RESECTION  2007   COLONOSCOPY W/ POLYPECTOMY     CORONARY ATHERECTOMY N/A 07/01/2021   Procedure: CORONARY ATHERECTOMY;  Surgeon: Burnell Blanks, MD;  Location: [place] [facility];  Service: Cardiovascular;  Laterality: N/A;   CORONARY STENT INTERVENTION N/A 07/01/2021   Procedure: CORONARY STENT INTERVENTION;  Surgeon: Burnell Blanks, MD;  Location: [place] [facility];  Service: Cardiovascular;  Laterality: N/A;   HEMORRHOID SURGERY N/A 01/13/2016   Procedure: HEMORRHOIDECTOMY AND REMOVAL OF ANAL SKIN TAG;  Surgeon: Coralie Keens, MD;  Location: [place];  Service: General;  Laterality: N/A;   INTRAVASCULAR PRESSURE WIRE/FFR STUDY N/A 06/18/2021   Procedure: INTRAVASCULAR PRESSURE WIRE/FFR STUDY;  Surgeon: Burnell Blanks, MD;  Location: [place] [facility];  Service: Cardiovascular;  Laterality: N/A;   INTRAVASCULAR ULTRASOUND/IVUS N/A 07/01/2021   Procedure: Intravascular Ultrasound/IVUS;  Surgeon: Burnell Blanks, MD;  Location: [place] [facility];  Service: Cardiovascular;  Laterality: N/A;   REPLACEMENT TOTAL KNEE Left 2005   left   RIGHT/LEFT HEART CATH AND CORONARY ANGIOGRAPHY N/A 06/18/2021   Procedure: RIGHT/LEFT HEART CATH AND CORONARY ANGIOGRAPHY;  Surgeon: Burnell Blanks, MD;  Location: [place] [facility];  Service: Cardiovascular;  Laterality: N/A;   SYNOVIAL CYST EXCISION     lumbar spine   TONSILLECTOMY     TOTAL KNEE ARTHROPLASTY Right 01/06/2017   Procedure: RIGHT TOTAL KNEE ARTHROPLASTY;  Surgeon: Sydnee Cabal, MD;  [facility];  Service: Orthopedics;  Laterality: Right;  Adductior  Block    There were no vitals filed for this visit.   Subjective Assessment - 09/23/21 1135     Subjective Pt states he did well after last session and was not as sore as he expected. He had no increase in pain.    Pertinent History skull through C7 fusion, 8 surgeries, L3-S1 fusion done twice.    Limitations Sitting;Lifting;Standing;Walking    How long can you sit comfortably? limited must continue to move.    How long can you stand comfortably? limited due to pain. <5    How long can  you walk comfortably? limited 2-3 mins before pain starts. cannot walk due to pain. at most across parking lot.    Diagnostic tests CT of the lumbar spine taken in August 2020 was independently visualized by me.  Patient did unfortunately have an L5-S1 fusion and it appeared that there was loosening of the S1 screws bilaterally right greater than left.  Patient has also had a past medical history for a L3-L5 fusion noted.  Patient did have severe left-sided L2-L3 narrowing with moderate central canal stenosis.  Intra surgical imaging from January 2021 showed an anterior fusion at L5-S1 and interbody fusion that was loose seem to be removed.    Patient Stated Goals get out and walk, 1 mile and back. lives near park and wants to walk. wants to improve stregnth of LE in order to ambulate safely especially steps and stairs. (can only go up 3-5 steps with fatigue with hand rail),    Currently in Pain? Yes    Pain Score 5     Pain Location Back    Pain Orientation Right;Left    Pain Descriptors / Indicators Aching    Pain Onset More than a month ago    Pain Score 5    Pain Location Neck    Pain Orientation Left;Right    Pain Descriptors / Indicators Aching;Dull                [facility] PT Assessment - 09/23/21 0001       Assessment   Medical Diagnosis Low back pain and cervical neck pain    Referring Provider (PT) Hulan Saas DO    Onset Date/Surgical Date 12/19/20    Prior Therapy Yes. had therapy for his knees but limited therapy for his back      Precautions   Precautions Back    Precaution Comments no lifitng heavy objects      Restrictions   Weight Bearing Restrictions No      Balance Screen   Has the patient fallen in the past 6 months No    Has the patient had a decrease in activity level because of a fear of falling?  No      Home Environment   Living Environment Private residence    Available Help at Discharge Family    Type of [place] to enter     Entrance Stairs-Number of Steps 3      Prior Function   Level of Independence Independent with basic ADLs    Vocation Retired    Leisure wants to walk dog and walk around park      [employer]   Attention Focused    Focused Attention Appears intact    Memory Appears intact    Awareness Appears intact    Problem Solving Appears intact      Sensation   Light Touch Appears Intact  Coordination   Gross Motor Movements are Fluid and Coordinated Yes    Fine Motor Movements are Fluid and Coordinated Yes      Posture/Postural Control   Posture/Postural Control Postural limitations    Postural Limitations Rounded Shoulders;Forward head;Decreased lumbar lordosis    Posture Comments forward head, rounded shoulder, decreased lordosis of lower back      AROM   Overall AROM Comments Patient is able to use hip in order to flex foward and rotate as he has limited mobility in lumbar.    Cervical Flexion None due to complete Fusion of C-spine    Cervical Extension None due to complete Fusion of C-spine    Cervical - Right Side Bend None due to complete Fusion of C-spine    Cervical - Left Side Bend None due to complete Fusion of C-spine    Cervical - Right Rotation \None due to complete Fusion of C-spine    Cervical - Left Rotation None due to complete Fusion of C-spine    Lumbar Flexion Limited due to fusion of L3-S1    Lumbar Extension Limited due to fusion of L3-S1    Lumbar - Right Side Bend Limited due to fusion of L3-S1    Lumbar - Left Side Bend Limited due to fusion of L3-S1    Lumbar - Right Rotation Limited due to fusion of L3-S1    Lumbar - Left Rotation Limited due to fusion of L3-S1      Strength   Right Hip Flexion 4+/5    Right Hip ABduction 5/5    Right Hip ADduction 5/5    Left Hip Flexion 4+/5    Left Hip ABduction 5/5    Left Hip ADduction 5/5    Right Knee Flexion 5/5    Right Knee Extension 5/5    Left Knee Flexion 5/5    Left Knee Extension 5/5       Transfers   Transfers Sit to Stand;Stand to Sit    Sit to Stand 7: Independent    Five time sit to stand comments  16.5    Stand to Sit 7: Independent      Ambulation/Gait   Ambulation Distance (Feet) 538 Feet    Gait Pattern Trendelenburg    Gait Comments WFL outside of postural deficits due to fusion; 384 ft walked before stopping due to pain                                    PT Education - 09/23/21 1155     Education Details anatomy, exercise progression, HEP update, exam findings, POC    Person(s) Educated Patient    Methods Explanation;Demonstration;Verbal cues;Handout;Tactile cues    Comprehension Verbalized understanding;Returned demonstration              PT Short Term Goals - 09/23/21 1300       PT SHORT TERM GOAL #1   Title Patient will report increase knee extension strength to 4/5 bilaterally.    Baseline --    Time --    Period Weeks    Status Achieved    Target Date --      PT SHORT TERM GOAL #2   Title Patient will report decrease pain by 30%.    Baseline 5/10 neck, 5/10 lower back    Time 3    Period Weeks    Status On-going    Target Date --  PT SHORT TERM GOAL #3   Title Pt will be able to perform 5XSTS in under 12  in order to demonstrate functional improvement above the cut off score for older adults.    Time 4    Period Weeks    Status New    Target Date --               PT Long Term Goals - 09/23/21 1301       PT LONG TERM GOAL #1   Title Patient will improve lower extremity strenght to 5/5 throughout in order to ambulate 10 steps with decreased fatigue.    Baseline 3 steps with fatigue    Time 6    Period Weeks    Status Partially Met      PT LONG TERM GOAL #2   Title Patient will improve distance by 400 feet in 6 minute walk test in order walk ambulate 2 miles around community park.    Time 6    Period Weeks    Status On-going      PT LONG TERM GOAL #3   Title Patient will be independent  with land and aquatic HEP in order to perform safely at home.    Baseline inital HEP    Time 6    Period Weeks    Status Achieved                   Plan - 09/23/21 1149     Clinical Impression Statement Pt with good progress in LE strength as compared to previous progress note. Pt has progressed from the aquatic environment and transitioning to land based exercise and developing his walking endurance. Pt's largest deficit continues to lumbopelvic motor control and muscle endurance with quickly fatiguing lumbar stabilizers and postural deficits after short periods of walking. Pt able to reach 346f today without taking a standing break in recovery position. Plan to continue with land based exercise and resistance training as tolerated. Pt's is not pain limited at this time. Pt would benefit from continued skilled therapy in order to reach goals and maximize functional LE lumbopelvic strength for prevention of further functional decline.    Personal Factors and Comorbidities Fitness;Time since onset of injury/illness/exacerbation;Comorbidity 3+;Past/Current Experience    Comorbidities stent palcement, bilateral knee replacements; complete cervical fusion    Examination-Activity Limitations Squat;Stairs;Stand;Locomotion Level;Transfers    Examination-Participation Restrictions Community Activity;Shop;Meal Prep;Cleaning;Yard Work    SMerchant navy officerEvolving/Moderate complexity    Rehab Potential Good    PT Frequency 2x / week    PT Duration 6 weeks    PT Treatment/Interventions ADLs/Self Care Home Management;Cryotherapy;Electrical Stimulation;Iontophoresis 425mml Dexamethasone;Moist Heat;Traction;Ultrasound;DME Instruction;Gait training;Stair training;Functional mobility training;Therapeutic activities;Therapeutic exercise;Neuromuscular re-education;Patient/family education;Manual techniques;Passive range of motion;Taping    PT Home Exercise Plan Red Band: seated hip  abduction 3x10, seated hip march 3x10, seated knee extension 3x10    Consulted and Agree with Plan of Care Patient             Patient will benefit from skilled therapeutic intervention in order to improve the following deficits and impairments:  Abnormal gait, Decreased endurance, Decreased mobility, Difficulty walking, Increased muscle spasms, Pain, Decreased activity tolerance, Decreased knowledge of precautions, Decreased range of motion, Decreased strength, Increased fascial restricitons  Visit Diagnosis: Cervicalgia  Chronic bilateral low back pain without sciatica  Other abnormalities of gait and mobility     Problem List Patient Active Problem List   Diagnosis Date Noted   Degenerative cervical disc 07/07/2021  Coronary artery disease involving native coronary artery of native heart with unstable angina pectoris [facility])    Coronary artery disease of native artery of native heart with stable angina pectoris (H[facility])    Pseudoarthrosis of lumbar spine 01/06/2020   Coronary artery calcification seen on CT scan 01/29/2019   Change in bowel habits 06/12/2018   Primary osteoarthritis of right knee 01/06/2017   S/P knee replacement 01/06/2017   Diplopia 07/10/2013   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

## 2021-09-28 ENCOUNTER — Other Ambulatory Visit: Payer: Self-pay

## 2021-09-28 ENCOUNTER — Encounter (HOSPITAL_BASED_OUTPATIENT_CLINIC_OR_DEPARTMENT_OTHER): Payer: Self-pay | Admitting: Physical Therapy

## 2021-09-28 ENCOUNTER — Ambulatory Visit (HOSPITAL_BASED_OUTPATIENT_CLINIC_OR_DEPARTMENT_OTHER): Payer: Medicare Other | Admitting: Physical Therapy

## 2021-09-28 DIAGNOSIS — M542 Cervicalgia: Secondary | ICD-10-CM | POA: Diagnosis not present

## 2021-09-28 DIAGNOSIS — R2689 Other abnormalities of gait and mobility: Secondary | ICD-10-CM | POA: Diagnosis not present

## 2021-09-28 DIAGNOSIS — M545 Low back pain, unspecified: Secondary | ICD-10-CM | POA: Diagnosis not present

## 2021-09-28 DIAGNOSIS — G8929 Other chronic pain: Secondary | ICD-10-CM | POA: Diagnosis not present

## 2021-09-28 NOTE — Therapy (Signed)
[facility] [address] [place], [place], [address] Phone: [phone]   Fax:  [phone]  Physical Therapy Treatment  Patient Details  Name: [name] MRN: [id number] Date of Birth: 1946-11-28 Referring Provider (PT): Hulan Saas DO   Encounter Date: 09/28/2021   PT End of Session - 09/28/21 0932     Visit Number 2    Number of Visits 9    Date for PT Re-Evaluation 12/22/21    Authorization Type medicare    PT Start Time 0930    PT Stop Time 1010    PT Time Calculation (min) 40 min    Activity Tolerance Patient tolerated treatment well    Behavior During Therapy [facility] for tasks assessed/performed             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   Paroxysmal atrial flutter ([place])     Past Surgical History:  Procedure Laterality Date   ABDOMINAL EXPOSURE N/A 01/06/2020   Procedure: ABDOMINAL EXPOSURE;  Surgeon: Rosetta Posner, MD;  [facility];  Service: Vascular;  Laterality: N/A;   ANTERIOR LUMBAR FUSION N/A 01/06/2020   Procedure: Lumbar Five- Sacral One  Anterior lumbar interbody fusion with infuse;  Surgeon: Kristeen Miss, MD;  Location: [place];  Service: Neurosurgery;  Laterality: N/A;  Lumbar Five- Sacral One  Anterior lumbar interbody fusion with infuse   CERVICAL FUSION  1996, 1997   2 surgery- skull to C7   COLON RESECTION  2007   COLONOSCOPY W/ POLYPECTOMY     CORONARY ATHERECTOMY N/A 07/01/2021   Procedure: CORONARY ATHERECTOMY;  Surgeon: Burnell Blanks, MD;   Location: [place] [facility];  Service: Cardiovascular;  Laterality: N/A;   CORONARY STENT INTERVENTION N/A 07/01/2021   Procedure: CORONARY STENT INTERVENTION;  Surgeon: Burnell Blanks, MD;  Location: [place] [facility];  Service: Cardiovascular;  Laterality: N/A;   HEMORRHOID SURGERY N/A 01/13/2016   Procedure: HEMORRHOIDECTOMY AND REMOVAL OF ANAL SKIN TAG;  Surgeon: Coralie Keens, MD;  Location: [place];  Service: General;  Laterality: N/A;   INTRAVASCULAR PRESSURE WIRE/FFR STUDY N/A 06/18/2021   Procedure: INTRAVASCULAR PRESSURE WIRE/FFR STUDY;  Surgeon: Burnell Blanks, MD;  Location: [place] [facility];  Service: Cardiovascular;  Laterality: N/A;   INTRAVASCULAR ULTRASOUND/IVUS N/A 07/01/2021   Procedure: Intravascular Ultrasound/IVUS;  Surgeon: Burnell Blanks, MD;  Location: [place] [facility];  Service: Cardiovascular;  Laterality: N/A;   REPLACEMENT TOTAL KNEE Left 2005   left   RIGHT/LEFT HEART CATH AND CORONARY ANGIOGRAPHY N/A 06/18/2021   Procedure: RIGHT/LEFT HEART CATH AND CORONARY ANGIOGRAPHY;  Surgeon: Burnell Blanks, MD;  Location: [place] [facility];  Service: Cardiovascular;  Laterality: N/A;   SYNOVIAL CYST EXCISION     lumbar spine   TONSILLECTOMY     TOTAL KNEE ARTHROPLASTY Right 01/06/2017   Procedure: RIGHT TOTAL KNEE ARTHROPLASTY;  Surgeon: Sydnee Cabal, MD;  [facility];  Service: Orthopedics;  Laterality: Right;  Adductior Block  There were no vitals filed for this visit.   Subjective Assessment - 09/28/21 0934     Subjective Pt states he was sore after last session but felt better after about 2 days. He states he did not sleep well last night.    Pertinent History skull through C7 fusion, 8 surgeries, L3-S1 fusion done twice.    Limitations Sitting;Lifting;Standing;Walking    How long can you sit comfortably? limited must continue to move.    How long can you stand comfortably? limited due to pain. <5    How long can you  walk comfortably? limited 2-3 mins before pain starts. cannot walk due to pain. at most across parking lot.    Diagnostic tests CT of the lumbar spine taken in August 2020 was independently visualized by me.  Patient did unfortunately have an L5-S1 fusion and it appeared that there was loosening of the S1 screws bilaterally right greater than left.  Patient has also had a past medical history for a L3-L5 fusion noted.  Patient did have severe left-sided L2-L3 narrowing with moderate central canal stenosis.  Intra surgical imaging from January 2021 showed an anterior fusion at L5-S1 and interbody fusion that was loose seem to be removed.    Patient Stated Goals get out and walk, 1 mile and back. [place] park and wants to walk. wants to improve stregnth of LE in order to ambulate safely especially steps and stairs. (can only go up 3-5 steps with fatigue with hand rail),    Currently in Pain? Yes    Pain Score 5     Pain Location Back    Pain Orientation Left;Right    Pain Descriptors / Indicators Aching    Pain Onset More than a month ago    Pain Score 5    Pain Location Neck    Pain Orientation Left;Right    Pain Descriptors / Indicators Aching;Dull                   Knee/Hip Exercises: Aerobic   Nustep L5 7 min           Treatment:   step ups with UE support 2x10 8" box   sidestepping GTB at knees 72f x2 RDL 15lb KB 2x10 bird dog 2x10 elliptical intervals 30-45s with 30-45s rest 5x  Review of lifting mechanics and gym safety/appropriateness      PT Short Term Goals - 09/23/21 1300       PT SHORT TERM GOAL #1   Title Patient will report increase knee extension strength to 4/5 bilaterally.    Baseline --    Time --    Period Weeks    Status Achieved    Target Date --      PT SHORT TERM GOAL #2   Title Patient will report decrease pain by 30%.    Baseline 5/10 neck, 5/10 lower back    Time 3    Period Weeks    Status On-going    Target Date --      PT  SHORT TERM GOAL #3   Title Pt will be able to perform 5XSTS in under 12  in order to demonstrate functional improvement above the cut off score for older adults.    Time 4    Period Weeks    Status New    Target Date --               PT Long Term Goals - 09/23/21 1301  PT LONG TERM GOAL #1   Title Patient will improve lower extremity strenght to 5/5 throughout in order to ambulate 10 steps with decreased fatigue.    Baseline 3 steps with fatigue    Time 6    Period Weeks    Status Partially Met      PT LONG TERM GOAL #2   Title Patient will improve distance by 400 feet in 6 minute walk test in order walk ambulate 2 miles around community park.    Time 6    Period Weeks    Status On-going      PT LONG TERM GOAL #3   Title Patient will be independent with land and aquatic HEP in order to perform safely at home.    Baseline inital HEP    Time 6    Period Weeks    Status Achieved                   Plan - 09/28/21 0935     Clinical Impression Statement Pt able to continue with gym based exercises today with increased degrees of freedom and progressed elliptical intervals to simulate walking without increase in pain. Pt able to perform step ups with good upright posture and positioning, though increased in posterior chain fatigue noted on second set. Plan to continue with lumbar stability and postural exercise and provide HEP for gym exercise. Begin to start tapering to 1x/week after set up of home exercise program. Pt would benefit from continued skilled therapy in order to reach goals and maximize functional LE lumbopelvic strength for prevention of further functional decline.    Personal Factors and Comorbidities Fitness;Time since onset of injury/illness/exacerbation;Comorbidity 3+;Past/Current Experience    Comorbidities stent palcement, bilateral knee replacements; complete cervical fusion    Examination-Activity Limitations Squat;Stairs;Stand;Locomotion  Level;Transfers    Examination-Participation Restrictions Community Activity;Shop;Meal Prep;Cleaning;Yard Work    Merchant navy officer Evolving/Moderate complexity    Rehab Potential Good    PT Frequency 2x / week    PT Duration 6 weeks    PT Treatment/Interventions ADLs/Self Care Home Management;Cryotherapy;Electrical Stimulation;Iontophoresis 62m/ml Dexamethasone;Moist Heat;Traction;Ultrasound;DME Instruction;Gait training;Stair training;Functional mobility training;Therapeutic activities;Therapeutic exercise;Neuromuscular re-education;Patient/family education;Manual techniques;Passive range of motion;Taping    PT Home Exercise Plan Red Band: seated hip abduction 3x10, seated hip march 3x10, seated knee extension 3x10    Consulted and Agree with Plan of Care Patient             Patient will benefit from skilled therapeutic intervention in order to improve the following deficits and impairments:  Abnormal gait, Decreased endurance, Decreased mobility, Difficulty walking, Increased muscle spasms, Pain, Decreased activity tolerance, Decreased knowledge of precautions, Decreased range of motion, Decreased strength, Increased fascial restricitons  Visit Diagnosis: Cervicalgia  Chronic bilateral low back pain without sciatica  Other abnormalities of gait and mobility     Problem List Patient Active Problem List   Diagnosis Date Noted   Degenerative cervical disc 07/07/2021   Coronary artery disease involving native coronary artery of native heart with unstable angina pectoris ([facility])    Coronary artery disease of native artery of native heart with stable angina pectoris ([facility])    Pseudoarthrosis of lumbar spine 01/06/2020   Coronary artery calcification seen on CT scan 01/29/2019   Change in bowel habits 06/12/2018   Primary osteoarthritis of right knee 01/06/2017   S/P knee replacement 01/06/2017   Diplopia 07/10/2013   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

## 2021-09-29 DIAGNOSIS — I4892 Unspecified atrial flutter: Secondary | ICD-10-CM | POA: Diagnosis not present

## 2021-09-29 DIAGNOSIS — Z23 Encounter for immunization: Secondary | ICD-10-CM | POA: Diagnosis not present

## 2021-09-29 DIAGNOSIS — R7303 Prediabetes: Secondary | ICD-10-CM | POA: Diagnosis not present

## 2021-09-29 DIAGNOSIS — E538 Deficiency of other specified B group vitamins: Secondary | ICD-10-CM | POA: Diagnosis not present

## 2021-09-29 DIAGNOSIS — I10 Essential (primary) hypertension: Secondary | ICD-10-CM | POA: Diagnosis not present

## 2021-09-29 DIAGNOSIS — I2510 Atherosclerotic heart disease of native coronary artery without angina pectoris: Secondary | ICD-10-CM | POA: Diagnosis not present

## 2021-09-29 DIAGNOSIS — E782 Mixed hyperlipidemia: Secondary | ICD-10-CM | POA: Diagnosis not present

## 2021-09-30 ENCOUNTER — Encounter (HOSPITAL_BASED_OUTPATIENT_CLINIC_OR_DEPARTMENT_OTHER): Payer: Self-pay | Admitting: Physical Therapy

## 2021-09-30 ENCOUNTER — Ambulatory Visit (HOSPITAL_BASED_OUTPATIENT_CLINIC_OR_DEPARTMENT_OTHER): Payer: Medicare Other | Admitting: Physical Therapy

## 2021-09-30 ENCOUNTER — Other Ambulatory Visit: Payer: Self-pay

## 2021-09-30 DIAGNOSIS — M542 Cervicalgia: Secondary | ICD-10-CM

## 2021-09-30 DIAGNOSIS — G8929 Other chronic pain: Secondary | ICD-10-CM | POA: Diagnosis not present

## 2021-09-30 DIAGNOSIS — R2689 Other abnormalities of gait and mobility: Secondary | ICD-10-CM

## 2021-09-30 DIAGNOSIS — M545 Low back pain, unspecified: Secondary | ICD-10-CM

## 2021-10-01 NOTE — Therapy (Signed)
[facility] [address] [place], [place], [address] Phone: [phone]   Fax:  [phone]  Physical Therapy Treatment  Patient Details  Name: [name] MRN: [id number] Date of Birth: 27-Sep-1946 Referring Provider (PT): Hulan Saas DO   Encounter Date: 09/30/2021   PT End of Session - 09/30/21 1455     Visit Number 3    Number of Visits 9    Date for PT Re-Evaluation 12/22/21    Authorization Type medicare    PT Start Time 1015    PT Stop Time 1058    PT Time Calculation (min) 43 min    Activity Tolerance Patient tolerated treatment well    Behavior During Therapy [facility] for tasks assessed/performed             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   Paroxysmal atrial flutter ([place])     Past Surgical History:  Procedure Laterality Date   ABDOMINAL EXPOSURE N/A 01/06/2020   Procedure: ABDOMINAL EXPOSURE;  Surgeon: Rosetta Posner, MD;  [facility];  Service: Vascular;  Laterality: N/A;   ANTERIOR LUMBAR FUSION N/A 01/06/2020   Procedure: Lumbar Five- Sacral One  Anterior lumbar interbody fusion with infuse;  Surgeon: Kristeen Miss, MD;  Location: [place];  Service: Neurosurgery;  Laterality: N/A;  Lumbar Five- Sacral One  Anterior lumbar interbody fusion with infuse   CERVICAL FUSION  1996, 1997   2 surgery- skull to C7   COLON RESECTION  2007   COLONOSCOPY W/ POLYPECTOMY     CORONARY ATHERECTOMY N/A 07/01/2021   Procedure: CORONARY ATHERECTOMY;  Surgeon: Burnell Blanks, MD;   Location: [place] [facility];  Service: Cardiovascular;  Laterality: N/A;   CORONARY STENT INTERVENTION N/A 07/01/2021   Procedure: CORONARY STENT INTERVENTION;  Surgeon: Burnell Blanks, MD;  Location: [place] [facility];  Service: Cardiovascular;  Laterality: N/A;   HEMORRHOID SURGERY N/A 01/13/2016   Procedure: HEMORRHOIDECTOMY AND REMOVAL OF ANAL SKIN TAG;  Surgeon: Coralie Keens, MD;  Location: [facility];  Service: General;  Laterality: N/A;   INTRAVASCULAR PRESSURE WIRE/FFR STUDY N/A 06/18/2021   Procedure: INTRAVASCULAR PRESSURE WIRE/FFR STUDY;  Surgeon: Burnell Blanks, MD;  Location: [place] [facility];  Service: Cardiovascular;  Laterality: N/A;   INTRAVASCULAR ULTRASOUND/IVUS N/A 07/01/2021   Procedure: Intravascular Ultrasound/IVUS;  Surgeon: Burnell Blanks, MD;  Location: [place] [facility];  Service: Cardiovascular;  Laterality: N/A;   REPLACEMENT TOTAL KNEE Left 2005   left   RIGHT/LEFT HEART CATH AND CORONARY ANGIOGRAPHY N/A 06/18/2021   Procedure: RIGHT/LEFT HEART CATH AND CORONARY ANGIOGRAPHY;  Surgeon: Burnell Blanks, MD;  Location: [place] [facility];  Service: Cardiovascular;  Laterality: N/A;   SYNOVIAL CYST EXCISION     lumbar spine   TONSILLECTOMY     TOTAL KNEE ARTHROPLASTY Right 01/06/2017   Procedure: RIGHT TOTAL KNEE ARTHROPLASTY;  Surgeon: Sydnee Cabal, MD;  [facility];  Service: Orthopedics;  Laterality: Right;  Adductior Block  There were no vitals filed for this visit.   Subjective Assessment - 09/30/21 1452     Subjective Patient reports that he can feel the bridogs. He thinks that is something he needs to work on more. He feels like his back is about the same.    Pertinent History skull through C7 fusion, 8 surgeries, L3-S1 fusion done twice.    Limitations Sitting;Lifting;Standing;Walking    How long can you sit comfortably? limited must continue to move.    How long can you stand comfortably? limited due to pain. <5     How long can you walk comfortably? limited 2-3 mins before pain starts. cannot walk due to pain. at most across parking lot.    Diagnostic tests CT of the lumbar spine taken in August 2020 was independently visualized by me.  Patient did unfortunately have an L5-S1 fusion and it appeared that there was loosening of the S1 screws bilaterally right greater than left.  Patient has also had a past medical history for a L3-L5 fusion noted.  Patient did have severe left-sided L2-L3 narrowing with moderate central canal stenosis.  Intra surgical imaging from January 2021 showed an anterior fusion at L5-S1 and interbody fusion that was loose seem to be removed.    Patient Stated Goals get out and walk, 1 mile and back. [place] park and wants to walk. wants to improve stregnth of LE in order to ambulate safely especially steps and stairs. (can only go up 3-5 steps with fatigue with hand rail),    Currently in Pain? Yes    Pain Score 5     Pain Location Back    Pain Orientation Right;Left    Pain Descriptors / Indicators Aching    Pain Type Chronic pain    Pain Onset More than a month ago    Pain Frequency Constant    Aggravating Factors  standing and walking    Pain Relieving Factors medication and rest    Effect of Pain on Daily Activities difficulty walking for long periods    Multiple Pain Sites No                               [facility] Adult PT Treatment/Exercise - 10/01/21 0001       Knee/Hip Exercises: Aerobic   Nustep L5 6 min      Knee/Hip Exercises: Machines for Strengthening   Cybex Knee Extension 3x10 15 lbs    Other Machine hip abduction 3x1 50 lbs; row machine 3x10 20 lbs; leg press 3x10 60 lbs      Knee/Hip Exercises: Seated   Other Seated Knee/Hip Exercises bal roll with stretch forward 2x10; lateral 2x10;                     PT Education - 09/30/21 1454     Education Details reviewed gym exercises    Person(s) Educated Patient    Methods  Explanation;Demonstration;Tactile cues;Verbal cues    Comprehension Verbalized understanding;Returned demonstration;Verbal cues required;Tactile cues required              PT Short Term Goals - 09/23/21 1300       PT SHORT TERM GOAL #1   Title Patient will report increase knee extension strength to 4/5 bilaterally.    Baseline --    Time --    Period Weeks    Status Achieved    Target Date --  PT SHORT TERM GOAL #2   Title Patient will report decrease pain by 30%.    Baseline 5/10 neck, 5/10 lower back    Time 3    Period Weeks    Status On-going    Target Date --      PT SHORT TERM GOAL #3   Title Pt will be able to perform 5XSTS in under 12  in order to demonstrate functional improvement above the cut off score for older adults.    Time 4    Period Weeks    Status New    Target Date --               PT Long Term Goals - 09/23/21 1301       PT LONG TERM GOAL #1   Title Patient will improve lower extremity strenght to 5/5 throughout in order to ambulate 10 steps with decreased fatigue.    Baseline 3 steps with fatigue    Time 6    Period Weeks    Status Partially Met      PT LONG TERM GOAL #2   Title Patient will improve distance by 400 feet in 6 minute walk test in order walk ambulate 2 miles around community park.    Time 6    Period Weeks    Status On-going      PT LONG TERM GOAL #3   Title Patient will be independent with land and aquatic HEP in order to perform safely at home.    Baseline inital HEP    Time 6    Period Weeks    Status Achieved                   Plan - 09/30/21 1456     Clinical Impression Statement Patient fells good when he does his exercises. He had no increase in pain today. He was shown how to do ball stretches. Therapy talked to him about how to do it on the table. Therapy will continue to work on getting him comofrtable in the pool and the gym.    Personal Factors and Comorbidities Fitness;Time since onset  of injury/illness/exacerbation;Comorbidity 3+;Past/Current Experience    Comorbidities stent palcement, bilateral knee replacements; complete cervical fusion    Examination-Activity Limitations Squat;Stairs;Stand;Locomotion Level;Transfers    Examination-Participation Restrictions Community Activity;Shop;Meal Prep;Cleaning;Yard Work    [employer] officer Evolving/Moderate complexity    Clinical Decision Making Moderate    Rehab Potential Good    PT Frequency 2x / week    PT Duration 6 weeks    PT Treatment/Interventions ADLs/Self Care Home Management;Cryotherapy;Electrical Stimulation;Iontophoresis 64m/ml Dexamethasone;Moist Heat;Traction;Ultrasound;DME Instruction;Gait training;Stair training;Functional mobility training;Therapeutic activities;Therapeutic exercise;Neuromuscular re-education;Patient/family education;Manual techniques;Passive range of motion;Taping    PT Next Visit Plan 6MWT on land, gym based strength training    PT Home Exercise Plan [place]: seated hip abduction 3x10, seated hip march 3x10, seated knee extension 3x10    Consulted and Agree with Plan of Care Patient             Patient will benefit from skilled therapeutic intervention in order to improve the following deficits and impairments:  Abnormal gait, Decreased endurance, Decreased mobility, Difficulty walking, Increased muscle spasms, Pain, Decreased activity tolerance, Decreased knowledge of precautions, Decreased range of motion, Decreased strength, Increased fascial restricitons  Visit Diagnosis: Cervicalgia  Chronic bilateral low back pain without sciatica  Other abnormalities of gait and mobility     Problem List Patient Active Problem List   Diagnosis Date  Noted   Degenerative cervical disc 07/07/2021   Coronary artery disease involving native coronary artery of native heart with unstable angina pectoris [facility])    Coronary artery disease of native artery of native heart with  stable angina pectoris (HCC)    Pseudoarthrosis of lumbar spine 01/06/2020   Coronary artery calcification seen on CT scan 01/29/2019   Change in bowel habits 06/12/2018   Primary osteoarthritis of right knee 01/06/2017   S/P knee replacement 01/06/2017   Diplopia 07/10/2013   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

## 2021-10-04 ENCOUNTER — Other Ambulatory Visit: Payer: Self-pay

## 2021-10-04 ENCOUNTER — Encounter (HOSPITAL_BASED_OUTPATIENT_CLINIC_OR_DEPARTMENT_OTHER): Payer: Self-pay | Admitting: Family

## 2021-10-04 ENCOUNTER — Ambulatory Visit (INDEPENDENT_AMBULATORY_CARE_PROVIDER_SITE_OTHER): Payer: Medicare Other | Admitting: Family

## 2021-10-04 VITALS — BP 134/70 | HR 69 | Ht 71.0 in | Wt 256.0 lb

## 2021-10-04 DIAGNOSIS — E785 Hyperlipidemia, unspecified: Secondary | ICD-10-CM

## 2021-10-04 DIAGNOSIS — I25118 Atherosclerotic heart disease of native coronary artery with other forms of angina pectoris: Secondary | ICD-10-CM

## 2021-10-04 LAB — LIPID PANEL
Chol/HDL Ratio: 3.3 ratio (ref 0.0–5.0)
Cholesterol, Total: 99 mg/dL — ABNORMAL LOW (ref 100–199)
HDL: 30 mg/dL — ABNORMAL LOW (ref >39)
LDL Chol Calc (NIH): 49 mg/dL (ref 0–99)
Triglycerides: 106 mg/dL (ref 0–149)
VLDL Cholesterol Cal: 20 mg/dL (ref 5–40)

## 2021-10-04 LAB — COMPREHENSIVE METABOLIC PANEL
ALT: 24 IU/L (ref 0–44)
AST: 25 IU/L (ref 0–40)
Albumin/Globulin Ratio: 1.9 (ref 1.2–2.2)
Albumin: 4.9 g/dL — ABNORMAL HIGH (ref 3.7–4.7)
Alkaline Phosphatase: 91 IU/L (ref 44–121)
BUN/Creatinine Ratio: 27 — ABNORMAL HIGH (ref 10–24)
BUN: 32 mg/dL — ABNORMAL HIGH (ref 8–27)
Bilirubin Total: 0.4 mg/dL (ref 0.0–1.2)
CO2: 25 mmol/L (ref 20–29)
Calcium: 10 mg/dL (ref 8.6–10.2)
Chloride: 102 mmol/L (ref 96–106)
Creatinine, Ser: 1.17 mg/dL (ref 0.76–1.27)
Globulin, Total: 2.6 g/dL (ref 1.5–4.5)
Glucose: 107 mg/dL — ABNORMAL HIGH (ref 70–99)
Potassium: 5.1 mmol/L (ref 3.5–5.2)
Sodium: 140 mmol/L (ref 134–144)
Total Protein: 7.5 g/dL (ref 6.0–8.5)
eGFR: 64 mL/min/{1.73_m2} (ref >59)

## 2021-10-04 NOTE — Patient Instructions (Addendum)
Medication Instructions:  Continue your current medications.   *If you need a refill on your cardiac medications before your next appointment, please call your pharmacy*  Lab Work: Your physician recommends that you return for lab work today for fasting lipid panel and CMP. This will let us recheck your cholesterol numbers.   Testing/Procedures: None ordered today.   Follow-Up: At [facility], you and your health needs are our priority.  As part of our continuing mission to provide you with exceptional heart care, we have created designated Provider Care Teams.  These Care Teams include your primary Cardiologist (physician) and Advanced Practice Providers (APPs -  Physician Assistants and Nurse Practitioners) who all work together to provide you with the care you need, when you need it.  We recommend signing up for the patient portal called "MyChart".  Sign up information is provided on this After Visit Summary.  MyChart is used to connect with patients for Virtual Visits (Telemedicine).  Patients are able to view lab/test results, encounter notes, upcoming appointments, etc.  Non-urgent messages can be sent to your provider as well.   To learn more about what you can do with MyChart, go to [email or web address].    Your next appointment:   5-6 month(s)  The format for your next appointment:   In Person  Provider:   You may see [name], MD or one of the following Advanced Practice Providers on your designated Care Team:   Richardson Dopp, PA-C Vin Hickory, [place] Loel Dubonnet, NP   Other Instructions  Heart Healthy Diet Recommendations: A low-salt diet is recommended. Meats should be grilled, baked, or boiled. Avoid fried foods. Focus on lean protein sources like fish or chicken with vegetables and fruits. The American Heart Association is a Microbiologist!    Exercise recommendations: The American Heart Association recommends 150 minutes of moderate intensity exercise  weekly. Try 30 minutes of moderate intensity exercise 4-5 times per week. This could include walking, jogging, or swimming.

## 2021-10-04 NOTE — Progress Notes (Signed)
Office Visit    Patient Name: [name] Date of Encounter: 10/04/2021  PCP:  [name], [facility]  Cardiologist:  [name], MD  Advanced Practice Provider:  No care team member to display Electrophysiologist:  None    Chief Complaint    [name] is a 75 y.o. male with a hx of paroxysmal atrial flutter, hypertension, hyperlipidemia, coronary artery disease s/p atherectomy and DES to LAD presents today for follow-up of coronary artery disease.   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   Paroxysmal atrial flutter ([place])    Past Surgical History:  Procedure Laterality Date   ABDOMINAL EXPOSURE N/A 01/06/2020   Procedure: ABDOMINAL EXPOSURE;  Surgeon: Rosetta Posner, MD;  [facility];  Service: Vascular;  Laterality: N/A;   ANTERIOR LUMBAR FUSION N/A 01/06/2020   Procedure: Lumbar Five- Sacral One  Anterior lumbar interbody fusion with infuse;  Surgeon: Kristeen Miss, MD;  Location: [facility];  Service: Neurosurgery;  Laterality: N/A;  Lumbar Five- Sacral One  Anterior lumbar interbody fusion with infuse   CERVICAL FUSION  1996, 1997   2 surgery- skull to C7   COLON RESECTION  2007   COLONOSCOPY W/ POLYPECTOMY     CORONARY ATHERECTOMY N/A 07/01/2021   Procedure: CORONARY ATHERECTOMY;  Surgeon: Burnell Blanks, MD;  Location: [place] [facility];  Service: Cardiovascular;  Laterality: N/A;   CORONARY STENT INTERVENTION N/A 07/01/2021   Procedure: CORONARY STENT INTERVENTION;  Surgeon:  Burnell Blanks, MD;  Location: [place] [facility];  Service: Cardiovascular;  Laterality: N/A;   HEMORRHOID SURGERY N/A 01/13/2016   Procedure: HEMORRHOIDECTOMY AND REMOVAL OF ANAL SKIN TAG;  Surgeon: Coralie Keens, MD;  Location: [place];  Service: General;  Laterality: N/A;   INTRAVASCULAR PRESSURE WIRE/FFR STUDY N/A 06/18/2021   Procedure: INTRAVASCULAR PRESSURE WIRE/FFR STUDY;  Surgeon: Burnell Blanks, MD;  Location: [place] [facility];  Service: Cardiovascular;  Laterality: N/A;   INTRAVASCULAR ULTRASOUND/IVUS N/A 07/01/2021   Procedure: Intravascular Ultrasound/IVUS;  Surgeon: Burnell Blanks, MD;  Location: [place] [facility];  Service: Cardiovascular;  Laterality: N/A;   REPLACEMENT TOTAL KNEE Left 2005   left   RIGHT/LEFT HEART CATH AND CORONARY ANGIOGRAPHY N/A 06/18/2021   Procedure: RIGHT/LEFT HEART CATH AND CORONARY ANGIOGRAPHY;  Surgeon: Burnell Blanks, MD;  Location: [place] [facility];  Service: Cardiovascular;  Laterality: N/A;   SYNOVIAL CYST EXCISION     lumbar spine   TONSILLECTOMY     TOTAL KNEE ARTHROPLASTY Right 01/06/2017   Procedure: RIGHT TOTAL KNEE ARTHROPLASTY;  Surgeon: Sydnee Cabal, MD;  [facility];  Service: Orthopedics;  Laterality: Right;  Adductior Block    Allergies  Allergies  Allergen Reactions   Alfuzosin Other (See Comments)    Felt winded/faint   Gabapentin Nausea Only   Pregabalin Nausea Only and Other (See Comments)    Felt poorly   Tamsulosin Hcl Other (  See Comments)    Orthostatic hypotension   Contrast Media [Iodinated Diagnostic Agents] Rash    Cath dye 06/18/21 rash started next day   Niacin Diarrhea    History of Present Illness    [name] is a 75 y.o. male with a hx of paroxysmal atrial flutter, hypertension, hyperlipidemia, coronary artery disease s/p atherectomy and DES to LAD last seen 07/16/21.   He follows with Dr. Harrington Challenger.  He was seen in clinic 05/2021 noting exertional dyspnea.  He had  previous CT scan with coronary calcification.  He underwent cardiac catheterization 06/18/2021 noting severe heavily calcified proximal and mid LAD stenosis which was flow-limiting PressureWire.  Given complex heavily calcified and diseased decision was made during back for staged PCI of the LAD with orbital atherectomy and stenting.  Plavix load was completed.  He underwent cardiac catheterization 07/01/2021 with successful orbital atherectomy to the mid LAD with placement of DES x 3.  He was recommended for DAPT for 1 year.  He did contact the office 07/05/2021 noting that when Crestor was increased until he was feeling constipated, headaches, lethargic.  His Crestor was recommended to be held for 1 week and then resumed at 10 mg every other.  Presents today for follow up. Doing overall well. Very appreciative of the care of his physical therapy team at [facility]. Did 3-4 weeks of water therapy and has been doing in-gym therapy more recently. He has been able to walk up to 384 feet prior to back pain limiting him. Reports no shortness of breath and stable dyspnea on exertion with more than usual activity. Reports no chest pain, pressure, or tightness. No edema, orthopnea, PND. Reports no palpitations.  Has been taking Crestor 5mg  QD and just trialed 10mg  with some fatigue.   EKGs/Labs/Other Studies Reviewed:   The following studies were reviewed today:  Cardiac Catheterization 07/01/2021:    Prox LAD to Mid LAD lesion is 80% stenosed.   Mid LAD lesion is 80% stenosed.   A drug-eluting stent was successfully placed using a STENT ONYX FRONTIER 2.75X12, STENT ONYX FRONTIER 2.5 x 15, STENT ONYX FRONTIER 2.25 x 8.   Post intervention, there is a 0% residual stenosis.   Post intervention, there is a 0% residual stenosis.   Severe mid LAD stenosis Successful orbital atherectomy mid LAD with placement of 3 overlapping drug eluting stents.   Recommendations: Continue DAPT with ASA and Plavix for at least 12  months given long overlapping stent segment. Will discharge home today after 6 hours bedrest.   Diagnostic Dominance: Right      Intervention      _____________  Cardiac catheterization 06/18/2021 Ost RCA to Prox RCA lesion is 50% stenosed. Mid RCA lesion is 40% stenosed. Dist RCA lesion is 30% stenosed. Prox LAD to Mid LAD lesion is 80% stenosed. Mid LAD lesion is 80% stenosed.   1. Severe, heavily calcified proximal and mid LAD stenosis. This is flow limiting by pressure wire analysis. 2. No obstructive disease in the Circum[place] 3. Moderate non-obstructive disease in the ostial/proximal RCA, mid RCA 4. No significant elevation of right and left heart pressures   Recommendations: He has complex heavily calcified disease in the proximal and mid LAD. The more proximal lesion involves a large septal branch and the mid lesion involves a large diagonal branch. He will be brought back in several weeks for staged PCI of the LAD with orbital atherectomy and stenting. I suspect that it will require a long segment of stenting  from the proximal through mid LAD. Will load with Plavix as an outpatient.  EKG:  No EKG is  ordered today.  The ekg independently reviewed from 07/16/21 demonstrated sinus bradycardia 59 bpm with no acute ST/T wave   Recent Labs: 06/28/2021: BUN 27; Creatinine, Ser 1.16; Hemoglobin 13.3; Platelets 186; Potassium 5.0; Sodium 138  Recent Lipid Panel    Component Value Date/Time   CHOL 152 08/10/2017 0937   TRIG 80 08/10/2017 0937   HDL 31 (L) 08/10/2017 0937   CHOLHDL 4.9 08/10/2017 0937   CHOLHDL 4.4 09/05/2016 1029   VLDL 22 09/05/2016 1029   LDLCALC 105 (H) 08/10/2017 0937   Home Medications   Current Meds  Medication Sig   amLODipine (NORVASC) 5 MG tablet TAKE 1 TABLET BY MOUTH TWICE A DAY   aspirin EC 81 MG tablet Take 81 mg by mouth every evening. Swallow whole.   B Complex-C (B-COMPLEX WITH VITAMIN C) tablet Take 1 tablet by mouth daily with lunch.    benazepril (LOTENSIN) 20 MG tablet Take 1 tablet (20 mg total) by mouth daily.   Cholecalciferol (VITAMIN D3) 50 MCG (2000 UT) TABS Take 2,000 Units by mouth daily with lunch. B complex, vitamin c 500   clopidogrel (PLAVIX) 75 MG tablet Take 1 tablet (75 mg total) by mouth daily.   Glucosamine-Chondroitin (COSAMIN DS PO) Take 1 tablet by mouth every evening.   hydrochlorothiazide (,MICROZIDE/HYDRODIURIL,) 12.5 MG capsule Take 12.5 mg by mouth daily with lunch.    methocarbamol (ROBAXIN) 500 MG tablet Take 500 mg by mouth every 8 (eight) hours as needed for muscle spasms.   metoprolol tartrate (LOPRESSOR) 25 MG tablet Take 25 mg by mouth daily as needed (afib.).    Multiple Vitamin (MULTIVITAMIN WITH MINERALS) TABS tablet Take 1 tablet by mouth daily with lunch. Adult 50+   nitroGLYCERIN (NITROSTAT) 0.4 MG SL tablet Place 1 tablet (0.4 mg total) under the tongue every 5 (five) minutes as needed.   oxyCODONE (ROXICODONE) 5 MG immediate release tablet Take 1-2 tablets (5-10 mg total) by mouth every 4 (four) hours as needed for severe pain or breakthrough pain.   rosuvastatin (CRESTOR) 10 MG tablet Take 10 mg by mouth daily.   scopolamine (TRANSDERM-SCOP) 1 MG/3DAYS Place 1 patch onto the skin every three (3) days as needed (nausea).   vitamin C (ASCORBIC ACID) 500 MG tablet Take 500 mg by mouth daily with lunch.   Zinc 50 MG TABS Take 50 mg by mouth every evening.     Review of Systems      All other systems reviewed and are otherwise negative except as noted above.  Physical Exam    VS:  BP 134/70   Pulse 69   Ht 5\' 11"  (1.803 m)   Wt 256 lb (116.1 kg)   SpO2 95%   BMI 35.70 kg/m  , BMI Body mass index is 35.7 kg/m.  Wt Readings from Last 3 Encounters:  10/04/21 256 lb (116.1 kg)  09/07/21 256 lb (116.1 kg)  07/16/21 256 lb (116.1 kg)     GEN: Well nourished, well developed, in no acute distress. HEENT: normal. Neck: Supple, no JVD, carotid bruits, or masses. Cardiac: RRR, no  murmurs, rubs, or gallops. No clubbing, cyanosis, edema.  Radials/PT 2+ and equal bilaterally.  Respiratory:  Respirations regular and unlabored, clear to auscultation bilaterally. GI: Soft, nontender, nondistended. MS: No deformity or atrophy. Skin: Warm and dry, no rash. R radial catheterization site with 1"x1" yellowed ecchymosis. No signs of  infection.  Neuro:  Strength and sensation are intact. Psych: Normal affect.  Assessment & Plan    CAD s/p atherectomy and DESx3 to LAD 07/01/21 - Stable with no anginal symptoms. No indication for ischemic evaluation.  GDMT includes DAPT Aspirin/Plavix for at least 1 year, Rosuvastatin. No beta blocker due to baseline bradycardia. Continue exercise plan with physical therapy. Plans to join [place] when he completes PT. Heart healthy diet and regular cardiovascular exercise encouraged.    HLD, LDL goal less than 70 - 03/31/21 LDL 52, triglycerides 88. Tolerated Rosuvastatin 5mg  QD. Notes some fatigue with 10mg  dose. We will update CMP, lipid panel to determine how much further to escalate Crestor.   Symptomatic atrial flutter /bradycardia -previously extremely symptomatic when he had episodes of atrial flutter.  This is a remote occurrence.  No documented recurrence. He has PRN Metoprolol which is not needing.   Disposition: Follow up in 5-6 month(s) with Dr. Harrington Challenger or APP.  Signed, Loel Dubonnet, NP 10/04/2021, 10:38 AM [place]

## 2021-10-05 ENCOUNTER — Ambulatory Visit (HOSPITAL_BASED_OUTPATIENT_CLINIC_OR_DEPARTMENT_OTHER): Payer: Medicare Other | Admitting: Physical Therapy

## 2021-10-05 ENCOUNTER — Encounter (HOSPITAL_BASED_OUTPATIENT_CLINIC_OR_DEPARTMENT_OTHER): Payer: Self-pay | Admitting: Physical Therapy

## 2021-10-05 DIAGNOSIS — R2689 Other abnormalities of gait and mobility: Secondary | ICD-10-CM | POA: Diagnosis not present

## 2021-10-05 DIAGNOSIS — M542 Cervicalgia: Secondary | ICD-10-CM

## 2021-10-05 DIAGNOSIS — M545 Low back pain, unspecified: Secondary | ICD-10-CM | POA: Diagnosis not present

## 2021-10-05 DIAGNOSIS — G8929 Other chronic pain: Secondary | ICD-10-CM

## 2021-10-05 NOTE — Therapy (Signed)
[facility] [address] [place], [place], [address] Phone: [phone]   Fax:  [phone]  Physical Therapy Treatment  Patient Details  Name: [name] MRN: [id number] Date of Birth: 04-30-1946 Referring Provider (PT): Hulan Saas DO   Encounter Date: 10/05/2021   PT End of Session - 10/05/21 0937     Visit Number 4    Number of Visits 9    Date for PT Re-Evaluation 12/22/21    Authorization Type medicare    PT Start Time 0935    PT Stop Time 3888    PT Time Calculation (min) 40 min    Activity Tolerance Patient tolerated treatment well    Behavior During Therapy [facility] for tasks assessed/performed             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   Paroxysmal atrial flutter ([place])     Past Surgical History:  Procedure Laterality Date   ABDOMINAL EXPOSURE N/A 01/06/2020   Procedure: ABDOMINAL EXPOSURE;  Surgeon: Rosetta Posner, MD;  [facility];  Service: Vascular;  Laterality: N/A;   ANTERIOR LUMBAR FUSION N/A 01/06/2020   Procedure: Lumbar Five- Sacral One  Anterior lumbar interbody fusion with infuse;  Surgeon: Kristeen Miss, MD;  Location: [facility];  Service: Neurosurgery;  Laterality: N/A;  Lumbar Five- Sacral One  Anterior lumbar interbody fusion with infuse   CERVICAL FUSION  1996, 1997   2 surgery- skull to C7   COLON RESECTION  2007   COLONOSCOPY W/ POLYPECTOMY     CORONARY ATHERECTOMY N/A 07/01/2021   Procedure: CORONARY ATHERECTOMY;  Surgeon: Burnell Blanks, MD;   Location: [place] [facility];  Service: Cardiovascular;  Laterality: N/A;   CORONARY STENT INTERVENTION N/A 07/01/2021   Procedure: CORONARY STENT INTERVENTION;  Surgeon: Burnell Blanks, MD;  Location: [place] [facility];  Service: Cardiovascular;  Laterality: N/A;   HEMORRHOID SURGERY N/A 01/13/2016   Procedure: HEMORRHOIDECTOMY AND REMOVAL OF ANAL SKIN TAG;  Surgeon: Coralie Keens, MD;  Location: [facility];  Service: General;  Laterality: N/A;   INTRAVASCULAR PRESSURE WIRE/FFR STUDY N/A 06/18/2021   Procedure: INTRAVASCULAR PRESSURE WIRE/FFR STUDY;  Surgeon: Burnell Blanks, MD;  Location: [place] [facility];  Service: Cardiovascular;  Laterality: N/A;   INTRAVASCULAR ULTRASOUND/IVUS N/A 07/01/2021   Procedure: Intravascular Ultrasound/IVUS;  Surgeon: Burnell Blanks, MD;  Location: [place] [facility];  Service: Cardiovascular;  Laterality: N/A;   REPLACEMENT TOTAL KNEE Left 2005   left   RIGHT/LEFT HEART CATH AND CORONARY ANGIOGRAPHY N/A 06/18/2021   Procedure: RIGHT/LEFT HEART CATH AND CORONARY ANGIOGRAPHY;  Surgeon: Burnell Blanks, MD;  Location: [facility] [facility];  Service: Cardiovascular;  Laterality: N/A;   SYNOVIAL CYST EXCISION     lumbar spine   TONSILLECTOMY     TOTAL KNEE ARTHROPLASTY Right 01/06/2017   Procedure: RIGHT TOTAL KNEE ARTHROPLASTY;  Surgeon: Sydnee Cabal, MD;  [facility];  Service: Orthopedics;  Laterality: Right;  Adductior Block  There were no vitals filed for this visit.   Subjective Assessment - 10/05/21 0937     Subjective Pt states he has some more back this past weekend but does not think it was from the exercise. He thinks it might be from how he slept. He feels better today.    Pertinent History skull through C7 fusion, 8 surgeries, L3-S1 fusion done twice.    Limitations Sitting;Lifting;Standing;Walking    How long can you sit comfortably? limited must continue to move.    How long can you stand comfortably? limited  due to pain. <5    How long can you walk comfortably? limited 2-3 mins before pain starts. cannot walk due to pain. at most across parking lot.    Diagnostic tests CT of the lumbar spine taken in August 2020 was independently visualized by me.  Patient did unfortunately have an L5-S1 fusion and it appeared that there was loosening of the S1 screws bilaterally right greater than left.  Patient has also had a past medical history for a L3-L5 fusion noted.  Patient did have severe left-sided L2-L3 narrowing with moderate central canal stenosis.  Intra surgical imaging from January 2021 showed an anterior fusion at L5-S1 and interbody fusion that was loose seem to be removed.    Patient Stated Goals get out and walk, 1 mile and back. [place] park and wants to walk. wants to improve stregnth of LE in order to ambulate safely especially steps and stairs. (can only go up 3-5 steps with fatigue with hand rail),    Currently in Pain? Yes    Pain Score 5     Pain Location Back    Pain Orientation Right;Left    Pain Descriptors / Indicators Aching;Dull    Pain Onset More than a month ago    Pain Score 5    Pain Location Neck    Pain Orientation Right;Left    Pain Descriptors / Indicators Aching;Dull              L5 6 min Nustep    Seated hip ABD 3x10 100lbs  sidestepping GTB at knees 16f x2 RDL 15lb KB 2x10 bird dog 10x 2s hold elliptical intervals 45s with 45s rest 5x       PT Short Term Goals - 09/23/21 1300       PT SHORT TERM GOAL #1   Title Patient will report increase knee extension strength to 4/5 bilaterally.    Baseline --    Time --    Period Weeks    Status Achieved    Target Date --      PT SHORT TERM GOAL #2   Title Patient will report decrease pain by 30%.    Baseline 5/10 neck, 5/10 lower back    Time 3    Period Weeks    Status On-going    Target Date --      PT SHORT TERM GOAL #3   Title Pt will be able to perform 5XSTS in under 12  in order to  demonstrate functional improvement above the cut off score for older adults.    Time 4    Period Weeks    Status New    Target Date --               PT Long Term Goals - 09/23/21 1301       PT LONG TERM GOAL #1   Title Patient will improve lower extremity strenght to 5/5  throughout in order to ambulate 10 steps with decreased fatigue.    Baseline 3 steps with fatigue    Time 6    Period Weeks    Status Partially Met      PT LONG TERM GOAL #2   Title Patient will improve distance by 400 feet in 6 minute walk test in order walk ambulate 2 miles around community park.    Time 6    Period Weeks    Status On-going      PT LONG TERM GOAL #3   Title Patient will be independent with land and aquatic HEP in order to perform safely at home.    Baseline inital HEP    Time 6    Period Weeks    Status Achieved                   Plan - 10/05/21 0941     Clinical Impression Statement Pt able to continue with progression of gym exercise and work on continuation in increased duration of elliptical intervals and hip strenthening exercise. Pt provided gym based HEP and exercises reviewed in order to ensure proper form and technique. Pt given edu about tapering of PT visits as he transitions to independent exercise. Progress hip and lumbar stability exercises as able. Pt would benefit from continued skilled therapy in order to reach goals and maximize functional LE lumbopelvic strength for prevention of further functional decline.    Personal Factors and Comorbidities Fitness;Time since onset of injury/illness/exacerbation;Comorbidity 3+;Past/Current Experience    Comorbidities stent palcement, bilateral knee replacements; complete cervical fusion    Examination-Activity Limitations Squat;Stairs;Stand;Locomotion Level;Transfers    Examination-Participation Restrictions Community Activity;Shop;Meal Prep;Cleaning;Yard Work    [employer] Evolving/Moderate  complexity    Rehab Potential Good    PT Frequency 2x / week    PT Duration 6 weeks    PT Treatment/Interventions ADLs/Self Care Home Management;Cryotherapy;Electrical Stimulation;Iontophoresis 74m/ml Dexamethasone;Moist Heat;Traction;Ultrasound;DME Instruction;Gait training;Stair training;Functional mobility training;Therapeutic activities;Therapeutic exercise;Neuromuscular re-education;Patient/family education;Manual techniques;Passive range of motion;Taping    PT Next Visit Plan Re-incorporate step up exercise to 6" box, fwd and lateral   PT Home Exercise Plan Red [place]: seated hip abduction 3x10, seated hip march 3x10, seated knee extension 3x10    Consulted and Agree with Plan of Care Patient             Patient will benefit from skilled therapeutic intervention in order to improve the following deficits and impairments:  Abnormal gait, Decreased endurance, Decreased mobility, Difficulty walking, Increased muscle spasms, Pain, Decreased activity tolerance, Decreased knowledge of precautions, Decreased range of motion, Decreased strength, Increased fascial restricitons  Visit Diagnosis: Cervicalgia  Chronic bilateral low back pain without sciatica  Other abnormalities of gait and mobility     Problem List Patient Active Problem List   Diagnosis Date Noted   Degenerative cervical disc 07/07/2021   Coronary artery disease involving native coronary artery of native heart with unstable angina pectoris (HCC)    Coronary artery disease of native artery of native heart with stable angina pectoris (HCC)    Pseudoarthrosis of lumbar spine 01/06/2020   Coronary artery calcification seen on CT scan 01/29/2019   Change in bowel habits 06/12/2018   Primary osteoarthritis of right knee 01/06/2017   S/P knee replacement 01/06/2017   Diplopia 07/10/2013   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

## 2021-10-07 ENCOUNTER — Other Ambulatory Visit: Payer: Self-pay

## 2021-10-07 ENCOUNTER — Encounter (HOSPITAL_BASED_OUTPATIENT_CLINIC_OR_DEPARTMENT_OTHER): Payer: Self-pay | Admitting: Physical Therapy

## 2021-10-07 ENCOUNTER — Ambulatory Visit (HOSPITAL_BASED_OUTPATIENT_CLINIC_OR_DEPARTMENT_OTHER): Payer: Medicare Other | Admitting: Physical Therapy

## 2021-10-07 DIAGNOSIS — G8929 Other chronic pain: Secondary | ICD-10-CM | POA: Diagnosis not present

## 2021-10-07 DIAGNOSIS — R2689 Other abnormalities of gait and mobility: Secondary | ICD-10-CM

## 2021-10-07 DIAGNOSIS — M542 Cervicalgia: Secondary | ICD-10-CM | POA: Diagnosis not present

## 2021-10-07 DIAGNOSIS — M545 Low back pain, unspecified: Secondary | ICD-10-CM

## 2021-10-08 ENCOUNTER — Telehealth: Payer: Self-pay | Admitting: Internal Medicine

## 2021-10-08 MED ORDER — BENAZEPRIL HCL 20 MG PO TABS: 20.0000 mg | ORAL_TABLET | Freq: Two times a day (BID) | ORAL | 3 refills | Status: DC

## 2021-10-08 NOTE — Telephone Encounter (Signed)
Pt c/o medication issue:  1. Name of Medication: benazepril (LOTENSIN) 20 MG tablet  2. How are you currently taking this medication (dosage and times per day)? Take 1 tablet (20 mg total) by mouth daily.  3. Are you having a reaction (difficulty breathing--STAT)? no  4. What is your medication issue? Patient states that he the prescription for medication is wrong. He is supposed to take 2 pills a day not one  Patient is expecting call back today because last time he has issues took a week to hear back.

## 2021-10-08 NOTE — Therapy (Signed)
[facility] [address] [place], [place], [address] Phone: [phone]   Fax:  [phone]  Physical Therapy Treatment  Patient Details  Name: [name] MRN: [id number] Date of Birth: 03-Jan-1946 Referring Provider (PT): Tony Saas DO   Encounter Date: 10/07/2021   PT End of Session - 10/07/21 1028     Visit Number 5    Number of Visits 9    Date for PT Re-Evaluation 12/22/21    Authorization Type medicare    PT Start Time 1015    PT Stop Time 1056    PT Time Calculation (min) 41 min    Activity Tolerance Patient tolerated treatment well    Behavior During Therapy [facility] for tasks assessed/performed             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   Paroxysmal atrial flutter ([place])     Past Surgical History:  Procedure Laterality Date   ABDOMINAL EXPOSURE N/A 01/06/2020   Procedure: ABDOMINAL EXPOSURE;  Surgeon: Tony Posner, MD;  [facility];  Service: Vascular;  Laterality: N/A;   ANTERIOR LUMBAR FUSION N/A 01/06/2020   Procedure: Lumbar Five- Sacral One  Anterior lumbar interbody fusion with infuse;  Surgeon: Tony Miss, MD;  Location: [facility];  Service: Neurosurgery;  Laterality: N/A;  Lumbar Five- Sacral One  Anterior lumbar interbody fusion with infuse   CERVICAL FUSION  1996, 1997   2 surgery- skull to C7   COLON RESECTION  2007   COLONOSCOPY W/ POLYPECTOMY     CORONARY ATHERECTOMY N/A 07/01/2021   Procedure: CORONARY ATHERECTOMY;  Surgeon: Tony Blanks, MD;   Location: [place] [facility];  Service: Cardiovascular;  Laterality: N/A;   CORONARY STENT INTERVENTION N/A 07/01/2021   Procedure: CORONARY STENT INTERVENTION;  Surgeon: Tony Blanks, MD;  Location: [place] [facility];  Service: Cardiovascular;  Laterality: N/A;   HEMORRHOID SURGERY N/A 01/13/2016   Procedure: HEMORRHOIDECTOMY AND REMOVAL OF ANAL SKIN TAG;  Surgeon: Tony Keens, MD;  Location: [facility];  Service: General;  Laterality: N/A;   INTRAVASCULAR PRESSURE WIRE/FFR STUDY N/A 06/18/2021   Procedure: INTRAVASCULAR PRESSURE WIRE/FFR STUDY;  Surgeon: Tony Blanks, MD;  Location: [place] [facility];  Service: Cardiovascular;  Laterality: N/A;   INTRAVASCULAR ULTRASOUND/IVUS N/A 07/01/2021   Procedure: Intravascular Ultrasound/IVUS;  Surgeon: Tony Blanks, MD;  Location: [place] [facility];  Service: Cardiovascular;  Laterality: N/A;   REPLACEMENT TOTAL KNEE Left 2005   left   RIGHT/LEFT HEART CATH AND CORONARY ANGIOGRAPHY N/A 06/18/2021   Procedure: RIGHT/LEFT HEART CATH AND CORONARY ANGIOGRAPHY;  Surgeon: Tony Blanks, MD;  Location: [place] [facility];  Service: Cardiovascular;  Laterality: N/A;   SYNOVIAL CYST EXCISION     lumbar spine   TONSILLECTOMY     TOTAL KNEE ARTHROPLASTY Right 01/06/2017   Procedure: RIGHT TOTAL KNEE ARTHROPLASTY;  Surgeon: Tony Cabal, MD;  [facility];  Service: Orthopedics;  Laterality: Right;  Adductior Block  There were no vitals filed for this visit.   Subjective Assessment - 10/07/21 1014     Subjective Patiet reports he was sore after the last time, but overall he feels like he is improving. He is going up and down steps better. He is motviated to be able to walk further.    Pertinent History skull through C7 fusion, 8 surgeries, L3-S1 fusion done twice.    Limitations Sitting;Lifting;Standing;Walking    How long can you sit comfortably? limited must continue to move.    How long can you stand  comfortably? limited due to pain. <5    How long can you walk comfortably? limited 2-3 mins before pain starts. cannot walk due to pain. at most across parking lot.    Diagnostic tests CT of the lumbar spine taken in August 2020 was independently visualized by me.  Patient did unfortunately have an L5-S1 fusion and it appeared that there was loosening of the S1 screws bilaterally right greater than left.  Patient has also had a past medical history for a L3-L5 fusion noted.  Patient did have severe left-sided L2-L3 narrowing with moderate central canal stenosis.  Intra surgical imaging from January 2021 showed an anterior fusion at L5-S1 and interbody fusion that was loose seem to be removed.    Patient Stated Goals get out and walk, 1 mile and back. [place] park and wants to walk. wants to improve stregnth of LE in order to ambulate safely especially steps and stairs. (can only go up 3-5 steps with fatigue with hand rail),    Currently in Pain? Yes    Pain Score 5     Pain Location Back    Pain Orientation Right;Left    Pain Descriptors / Indicators Aching    Pain Type Chronic pain    Pain Onset More than a month ago    Pain Frequency Constant    Aggravating Factors  standing and walking    Pain Relieving Factors medication and rest    Effect of Pain on Daily Activities difficulty walking for long periods    Multiple Pain Sites No                               [facility] Adult PT Treatment/Exercise - 10/08/21 0001       Knee/Hip Exercises: Machines for Strengthening   Cybex Knee Extension 3x10 15 lbs    Cybex Leg Press 3x10 100lbs    Other Machine hip abduction 3x1 50 lbs; row cable machine 3x10 25 lbs; leg press 3x10 60 lbs; cable extension 3x10 25lbs; pallof press 2x10 10lbs bilateral                     PT Education - 10/07/21 1027     Education Details reviewed final HEP    Person(s) Educated Patient    Methods Explanation;Demonstration;Tactile  cues;Verbal cues    Comprehension Verbalized understanding;Returned demonstration;Verbal cues required              PT Short Term Goals - 09/23/21 1300       PT SHORT TERM GOAL #1   Title Patient will report increase knee extension strength to 4/5 bilaterally.    Baseline --    Time --    Period Weeks    Status Achieved    Target Date --      PT SHORT TERM GOAL #2   Title Patient will  report decrease pain by 30%.    Baseline 5/10 neck, 5/10 lower back    Time 3    Period Weeks    Status On-going    Target Date --      PT SHORT TERM GOAL #3   Title Pt will be able to perform 5XSTS in under 12  in order to demonstrate functional improvement above the cut off score for older adults.    Time 4    Period Weeks    Status New    Target Date --               PT Long Term Goals - 09/23/21 1301       PT LONG TERM GOAL #1   Title Patient will improve lower extremity strenght to 5/5 throughout in order to ambulate 10 steps with decreased fatigue.    Baseline 3 steps with fatigue    Time 6    Period Weeks    Status Partially Met      PT LONG TERM GOAL #2   Title Patient will improve distance by 400 feet in 6 minute walk test in order walk ambulate 2 miles around community park.    Time 6    Period Weeks    Status On-going      PT LONG TERM GOAL #3   Title Patient will be independent with land and aquatic HEP in order to perform safely at home.    Baseline inital HEP    Time 6    Period Weeks    Status Achieved                   Plan - 10/08/21 1037     Clinical Impression Statement Patient continues to tolerate ther-ex well. He had no significant pain with treatment. He could feel the standing cable exercises but had no significant pain. He continues to progress well towards indepdedent program.    Personal Factors and Comorbidities Fitness;Time since onset of injury/illness/exacerbation;Comorbidity 3+;Past/Current Experience    Comorbidities stent  palcement, bilateral knee replacements; complete cervical fusion    Examination-Activity Limitations Squat;Stairs;Stand;Locomotion Level;Transfers    Stability/Clinical Decision Making Evolving/Moderate complexity    Clinical Decision Making Moderate    Rehab Potential Good    PT Frequency 2x / week    PT Duration 6 weeks    PT Treatment/Interventions ADLs/Self Care Home Management;Cryotherapy;Electrical Stimulation;Iontophoresis 71m/ml Dexamethasone;Moist Heat;Traction;Ultrasound;DME Instruction;Gait training;Stair training;Functional mobility training;Therapeutic activities;Therapeutic exercise;Neuromuscular re-education;Patient/family education;Manual techniques;Passive range of motion;Taping    PT Next Visit Plan 6MWT on land, gym based strength training    PT Home Exercise Plan Red [place]: seated hip abduction 3x10, seated hip march 3x10, seated knee extension 3x10    Consulted and Agree with Plan of Care Patient             Patient will benefit from skilled therapeutic intervention in order to improve the following deficits and impairments:  Abnormal gait, Decreased endurance, Decreased mobility, Difficulty walking, Increased muscle spasms, Pain, Decreased activity tolerance, Decreased knowledge of precautions, Decreased range of motion, Decreased strength, Increased fascial restricitons  Visit Diagnosis: Cervicalgia  Chronic bilateral low back pain without sciatica  Other abnormalities of gait and mobility     Problem List Patient Active Problem List   Diagnosis Date Noted   Degenerative cervical disc 07/07/2021   Coronary artery disease involving native coronary artery of native heart with unstable angina pectoris ([place]    Coronary artery disease of native artery of native heart  with stable angina pectoris ([place])    Pseudoarthrosis of lumbar spine 01/06/2020   Coronary artery calcification seen on CT scan 01/29/2019   Change in bowel habits 06/12/2018   Primary  osteoarthritis of right knee 01/06/2017   S/P knee replacement 01/06/2017   Diplopia 07/10/2013   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 [place], [place], [address] Phone: [phone]   Fax:  [phone]  Name: [name] MRN: [id number] Date of Birth: 1946-10-09

## 2021-10-08 NOTE — Telephone Encounter (Signed)
Pt called to report that for a few years he has been on Lotensin 20 mg bid but he picked up his last RX several weeks ago and did not notice the instructions were different fro qd and his tabs ran out too soon because he has still been taking them BID.... In reviewing his chart... 06/09/21 he had labs and his Creat was elev and we held his ACE but he says since then they have reestablished him to take it bid and his BP has been consistent 135/70-80.    He says he feels well and he has had labs since then last one being 10/04/21 and his Kidney function has been stable... per Dr. Harrington Challenger pt will continue his current bid dose.

## 2021-10-12 ENCOUNTER — Encounter (HOSPITAL_BASED_OUTPATIENT_CLINIC_OR_DEPARTMENT_OTHER): Payer: Medicare Other | Attending: Family Medicine | Admitting: Physical Therapy

## 2021-10-12 ENCOUNTER — Other Ambulatory Visit: Payer: Self-pay

## 2021-10-12 ENCOUNTER — Encounter (HOSPITAL_BASED_OUTPATIENT_CLINIC_OR_DEPARTMENT_OTHER): Payer: Self-pay | Admitting: Physical Therapy

## 2021-10-12 DIAGNOSIS — M545 Low back pain, unspecified: Secondary | ICD-10-CM | POA: Insufficient documentation

## 2021-10-12 DIAGNOSIS — M542 Cervicalgia: Secondary | ICD-10-CM | POA: Insufficient documentation

## 2021-10-12 DIAGNOSIS — G8929 Other chronic pain: Secondary | ICD-10-CM | POA: Diagnosis not present

## 2021-10-12 DIAGNOSIS — R2689 Other abnormalities of gait and mobility: Secondary | ICD-10-CM | POA: Insufficient documentation

## 2021-10-12 NOTE — Therapy (Signed)
[facility] [address] [place], [place], [address] Phone: [phone]   Fax:  [phone]  Physical Therapy Treatment  Patient Details  Name: [name] MRN: [id number] Date of Birth: [birth year] Referring Provider (PT): Hulan Saas DO   Encounter Date: 10/12/2021   PT End of Session - 10/12/21 0931     Visit Number 6    Number of Visits 9    Date for PT Re-Evaluation 12/22/21    Authorization Type medicare    PT Start Time 0932    PT Stop Time 3212    PT Time Calculation (min) 43 min    Activity Tolerance Patient tolerated treatment well    Behavior During Therapy [facility] for tasks assessed/performed             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   Paroxysmal atrial flutter ([place])     Past Surgical History:  Procedure Laterality Date   ABDOMINAL EXPOSURE N/A 01/06/2020   Procedure: ABDOMINAL EXPOSURE;  Surgeon: Rosetta Posner, MD;  [facility];  Service: Vascular;  Laterality: N/A;   ANTERIOR LUMBAR FUSION N/A 01/06/2020   Procedure: Lumbar Five- Sacral One  Anterior lumbar interbody fusion with infuse;  Surgeon: Kristeen Miss, MD;  Location: [facility];  Service: Neurosurgery;  Laterality: N/A;  Lumbar Five- Sacral One  Anterior lumbar interbody fusion with infuse   CERVICAL FUSION  1996, 1997   2 surgery- skull to C7   COLON RESECTION  2007   COLONOSCOPY W/ POLYPECTOMY     CORONARY ATHERECTOMY N/A 07/01/2021   Procedure: CORONARY ATHERECTOMY;  Surgeon: Burnell Blanks, MD;   Location: [facility];  Service: Cardiovascular;  Laterality: N/A;   CORONARY STENT INTERVENTION N/A 07/01/2021   Procedure: CORONARY STENT INTERVENTION;  Surgeon: Burnell Blanks, MD;  Location: [facility];  Service: Cardiovascular;  Laterality: N/A;   HEMORRHOID SURGERY N/A 01/13/2016   Procedure: HEMORRHOIDECTOMY AND REMOVAL OF ANAL SKIN TAG;  Surgeon: Coralie Keens, MD;  Location: [facility];  Service: General;  Laterality: N/A;   INTRAVASCULAR PRESSURE WIRE/FFR STUDY N/A 06/18/2021   Procedure: INTRAVASCULAR PRESSURE WIRE/FFR STUDY;  Surgeon: Burnell Blanks, MD;  Location: [facility];  Service: Cardiovascular;  Laterality: N/A;   INTRAVASCULAR ULTRASOUND/IVUS N/A 07/01/2021   Procedure: Intravascular Ultrasound/IVUS;  Surgeon: Burnell Blanks, MD;  Location: [place];  Service: Cardiovascular;  Laterality: N/A;   REPLACEMENT TOTAL KNEE Left 2005   left   RIGHT/LEFT HEART CATH AND CORONARY ANGIOGRAPHY N/A 06/18/2021   Procedure: RIGHT/LEFT HEART CATH AND CORONARY ANGIOGRAPHY;  Surgeon: Burnell Blanks, MD;  Location: [facility];  Service: Cardiovascular;  Laterality: N/A;   SYNOVIAL CYST EXCISION     lumbar spine   TONSILLECTOMY     TOTAL KNEE ARTHROPLASTY Right 01/06/2017   Procedure: RIGHT TOTAL KNEE ARTHROPLASTY;  Surgeon: Sydnee Cabal, MD;  [facility];  Service: Orthopedics;  Laterality: Right;  Adductior Block  There were no vitals filed for this visit.   Subjective Assessment - 10/12/21 0935     Subjective Pt states he had "another bad weekend" bc of working on his table at home. He is in a slightly forward flexed position for multipe hours.    Pertinent History skull through C7 fusion, 8 surgeries, L3-S1 fusion done twice.    Limitations Sitting;Lifting;Standing;Walking    How long can you sit comfortably? limited must continue to move.    How long can you stand comfortably? limited due to pain. <5     How long can you walk comfortably? limited 2-3 mins before pain starts. cannot walk due to pain. at most across parking lot.    Diagnostic tests CT of the lumbar spine taken in August 2020 was independently visualized by me.  Patient did unfortunately have an L5-S1 fusion and it appeared that there was loosening of the S1 screws bilaterally right greater than left.  Patient has also had a past medical history for a L3-L5 fusion noted.  Patient did have severe left-sided L2-L3 narrowing with moderate central canal stenosis.  Intra surgical imaging from January 2021 showed an anterior fusion at L5-S1 and interbody fusion that was loose seem to be removed.    Patient Stated Goals get out and walk, 1 mile and back. [place] park and wants to walk. wants to improve stregnth of LE in order to ambulate safely especially steps and stairs. (can only go up 3-5 steps with fatigue with hand rail),    Currently in Pain? Yes    Pain Score 5     Pain Location Back    Pain Orientation Right;Left    Pain Descriptors / Indicators Aching    Pain Type Acute pain    Pain Onset More than a month ago    Pain Score 5    Pain Location Neck    Pain Orientation Left;Right    Pain Descriptors / Indicators Aching;Dull                 L5 6 min Nustep farmer's carry 15lb each hand 350f each Offset 15lb KB march 20x each side RDL 15lb KB 2x10 elliptical intervals 1 min on, 30s off 5x       PT Short Term Goals - 09/23/21 1300       PT SHORT TERM GOAL #1   Title Patient will report increase knee extension strength to 4/5 bilaterally.    Baseline --    Time --    Period Weeks    Status Achieved    Target Date --      PT SHORT TERM GOAL #2   Title Patient will report decrease pain by 30%.    Baseline 5/10 neck, 5/10 lower back    Time 3    Period Weeks    Status On-going    Target Date --      PT SHORT TERM GOAL #3   Title Pt will be able to perform 5XSTS in under 12  in order to demonstrate  functional improvement above the cut off score for older adults.    Time 4    Period Weeks    Status New    Target Date --               PT Long Term Goals - 09/23/21 1301       PT LONG TERM GOAL #1   Title Patient will improve lower extremity strenght to 5/5 throughout  in order to ambulate 10 steps with decreased fatigue.    Baseline 3 steps with fatigue    Time 6    Period Weeks    Status Partially Met      PT LONG TERM GOAL #2   Title Patient will improve distance by 400 feet in 6 minute walk test in order walk ambulate 2 miles around community park.    Time 6    Period Weeks    Status On-going      PT LONG TERM GOAL #3   Title Patient will be independent with land and aquatic HEP in order to perform safely at home.    Baseline inital HEP    Time 6    Period Weeks    Status Achieved                   Plan - 10/12/21 0950     Clinical Impression Statement Pt able to progress intervals of endurance today. He is was able to progress functional carrying and walking distance at today's session. Pt is progressing well to more upright and postural strengthening exercise.  Pt would benefit from continued skilled therapy in order to reach goals and maximize functional LE lumbopelvic strength for prevention of further functional decline.    Personal Factors and Comorbidities Fitness;Time since onset of injury/illness/exacerbation;Comorbidity 3+;Past/Current Experience    Comorbidities stent palcement, bilateral knee replacements; complete cervical fusion    Examination-Activity Limitations Squat;Stairs;Stand;Locomotion Level;Transfers    Stability/Clinical Decision Making Evolving/Moderate complexity    Rehab Potential Good    PT Frequency 2x / week    PT Duration 6 weeks    PT Treatment/Interventions ADLs/Self Care Home Management;Cryotherapy;Electrical Stimulation;Iontophoresis 28m/ml Dexamethasone;Moist Heat;Traction;Ultrasound;DME Instruction;Gait training;Stair  training;Functional mobility training;Therapeutic activities;Therapeutic exercise;Neuromuscular re-education;Patient/family education;Manual techniques;Passive range of motion;Taping    PT Next Visit Plan 6MWT on land, gym based strength training    PT Home Exercise Plan Red Band: seated hip abduction 3x10, seated hip march 3x10, seated knee extension 3x10    Consulted and Agree with Plan of Care Patient             Patient will benefit from skilled therapeutic intervention in order to improve the following deficits and impairments:  Abnormal gait, Decreased endurance, Decreased mobility, Difficulty walking, Increased muscle spasms, Pain, Decreased activity tolerance, Decreased knowledge of precautions, Decreased range of motion, Decreased strength, Increased fascial restricitons  Visit Diagnosis: Cervicalgia  Chronic bilateral low back pain without sciatica  Other abnormalities of gait and mobility     Problem List Patient Active Problem List   Diagnosis Date Noted   Degenerative cervical disc 07/07/2021   Coronary artery disease involving native coronary artery of native heart with unstable angina pectoris ([place]    Coronary artery disease of native artery of native heart with stable angina pectoris ([facility]    Pseudoarthrosis of lumbar spine 01/06/2020   Coronary artery calcification seen on CT scan 01/29/2019   Change in bowel habits 06/12/2018   Primary osteoarthritis of right knee 01/06/2017   S/P knee replacement 01/06/2017   Diplopia 07/10/2013   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

## 2021-10-14 ENCOUNTER — Other Ambulatory Visit: Payer: Self-pay

## 2021-10-14 ENCOUNTER — Ambulatory Visit (HOSPITAL_BASED_OUTPATIENT_CLINIC_OR_DEPARTMENT_OTHER): Payer: Medicare Other | Admitting: Physical Therapy

## 2021-10-14 ENCOUNTER — Encounter (HOSPITAL_BASED_OUTPATIENT_CLINIC_OR_DEPARTMENT_OTHER): Payer: Self-pay | Admitting: Physical Therapy

## 2021-10-14 DIAGNOSIS — M542 Cervicalgia: Secondary | ICD-10-CM

## 2021-10-14 DIAGNOSIS — M545 Low back pain, unspecified: Secondary | ICD-10-CM | POA: Diagnosis not present

## 2021-10-14 DIAGNOSIS — G8929 Other chronic pain: Secondary | ICD-10-CM

## 2021-10-14 DIAGNOSIS — R2689 Other abnormalities of gait and mobility: Secondary | ICD-10-CM | POA: Diagnosis not present

## 2021-10-15 NOTE — Therapy (Addendum)
[facility] [address] [place], [place], [address] Phone: [phone]   Fax:  [phone]  Physical Therapy Treatment/discharge  Patient Details  Name: [name] MRN: [id number] Date of Birth: [birth year] Referring Provider (PT): Hulan Saas DO   Encounter Date: 10/14/2021   PT End of Session - 10/14/21 1019     Visit Number 7    Number of Visits 9    Date for PT Re-Evaluation 12/22/21    Authorization Type medicare    PT Start Time 1015    PT Stop Time 1057    PT Time Calculation (min) 42 min    Activity Tolerance Patient tolerated treatment well    Behavior During Therapy [facility] for tasks assessed/performed             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   Paroxysmal atrial flutter ([place])     Past Surgical History:  Procedure Laterality Date   ABDOMINAL EXPOSURE N/A 01/06/2020   Procedure: ABDOMINAL EXPOSURE;  Surgeon: Rosetta Posner, MD;  [facility];  Service: Vascular;  Laterality: N/A;   ANTERIOR LUMBAR FUSION N/A 01/06/2020   Procedure: Lumbar Five- Sacral One  Anterior lumbar interbody fusion with infuse;  Surgeon: Kristeen Miss, MD;  Location: [facility];  Service: Neurosurgery;  Laterality: N/A;  Lumbar Five- Sacral One  Anterior lumbar interbody fusion with infuse   CERVICAL FUSION  1996, 1997   2 surgery- skull to C7   COLON RESECTION  2007   COLONOSCOPY W/ POLYPECTOMY     CORONARY ATHERECTOMY N/A 07/01/2021   Procedure: CORONARY ATHERECTOMY;  Surgeon: Burnell Blanks, MD;   Location: [place] [facility];  Service: Cardiovascular;  Laterality: N/A;   CORONARY STENT INTERVENTION N/A 07/01/2021   Procedure: CORONARY STENT INTERVENTION;  Surgeon: Burnell Blanks, MD;  Location: [facility];  Service: Cardiovascular;  Laterality: N/A;   HEMORRHOID SURGERY N/A 01/13/2016   Procedure: HEMORRHOIDECTOMY AND REMOVAL OF ANAL SKIN TAG;  Surgeon: Coralie Keens, MD;  Location: [facility];  Service: General;  Laterality: N/A;   INTRAVASCULAR PRESSURE WIRE/FFR STUDY N/A 06/18/2021   Procedure: INTRAVASCULAR PRESSURE WIRE/FFR STUDY;  Surgeon: Burnell Blanks, MD;  Location: [facility];  Service: Cardiovascular;  Laterality: N/A;   INTRAVASCULAR ULTRASOUND/IVUS N/A 07/01/2021   Procedure: Intravascular Ultrasound/IVUS;  Surgeon: Burnell Blanks, MD;  Location: [facility];  Service: Cardiovascular;  Laterality: N/A;   REPLACEMENT TOTAL KNEE Left 2005   left   RIGHT/LEFT HEART CATH AND CORONARY ANGIOGRAPHY N/A 06/18/2021   Procedure: RIGHT/LEFT HEART CATH AND CORONARY ANGIOGRAPHY;  Surgeon: Burnell Blanks, MD;  Location: [place] [facility];  Service: Cardiovascular;  Laterality: N/A;   SYNOVIAL CYST EXCISION     lumbar spine   TONSILLECTOMY     TOTAL KNEE ARTHROPLASTY Right 01/06/2017   Procedure: RIGHT TOTAL KNEE ARTHROPLASTY;  Surgeon: Sydnee Cabal, MD;  [facility];  Service: Orthopedics;  Laterality: Right;  Adductior Block  There were no vitals filed for this visit.   Subjective Assessment - 10/15/21 1219     Subjective Patient reports he has good days and bad days> He is in the middle today It is just a little sore.    Pertinent History skull through C7 fusion, 8 surgeries, L3-S1 fusion done twice.    Limitations Sitting;Lifting;Standing;Walking    How long can you sit comfortably? limited must continue to move.    How long can you stand comfortably? limited due to pain. <5    How long can you walk comfortably?  limited 2-3 mins before pain starts. cannot walk due to pain. at most across parking lot.    Diagnostic tests CT of the lumbar spine taken in August 2020 was independently visualized by me.  Patient did unfortunately have an L5-S1 fusion and it appeared that there was loosening of the S1 screws bilaterally right greater than left.  Patient has also had a past medical history for a L3-L5 fusion noted.  Patient did have severe left-sided L2-L3 narrowing with moderate central canal stenosis.  Intra surgical imaging from January 2021 showed an anterior fusion at L5-S1 and interbody fusion that was loose seem to be removed.    Patient Stated Goals get out and walk, 1 mile and back. [place] park and wants to walk. wants to improve stregnth of LE in order to ambulate safely especially steps and stairs. (can only go up 3-5 steps with fatigue with hand rail),    Currently in Pain? Yes    Pain Score 5     Pain Location Back    Pain Orientation Left;Right    Pain Descriptors / Indicators Aching    Pain Type Chronic pain    Pain Onset More than a month ago    Pain Frequency Constant    Aggravating Factors  standing and walking    Pain Relieving Factors medication and rest    Effect of Pain on Daily Activities difficulty walking for long periods of time    Multiple Pain Sites No                               [facility] Adult PT Treatment/Exercise - 10/15/21 0001       Shoulder Exercises: Power [employer]   Other [employer] Exercises chops 2x10 10 lbs    Other [employer] Exercises standsing rows 20lbs 2x10; split rope shoulder extension 2x10;      Manual Therapy   Manual Therapy Soft tissue mobilization    Soft tissue mobilization perfromed soft tissue mobilization to the patients lumbar spine. Reviewed how he can do to himself using a thera-cane. Also, reviewed use of the upper traps                     PT Education - 10/15/21 1220     Person(s) Educated Patient     Methods Explanation;Demonstration;Verbal cues;Tactile cues    Comprehension Verbalized understanding;Returned demonstration;Verbal cues required;Tactile cues required              PT Short Term Goals - 09/23/21 1300       PT SHORT TERM GOAL #1   Title Patient will report increase knee extension strength to 4/5 bilaterally.    Baseline --    Time --    Period Weeks    Status Achieved    Target Date --      PT SHORT  TERM GOAL #2   Title Patient will report decrease pain by 30%.    Baseline 5/10 neck, 5/10 lower back    Time 3    Period Weeks    Status On-going    Target Date --      PT SHORT TERM GOAL #3   Title Pt will be able to perform 5XSTS in under 12  in order to demonstrate functional improvement above the cut off score for older adults.    Time 4    Period Weeks    Status New    Target Date --               PT Long Term Goals - 09/23/21 1301       PT LONG TERM GOAL #1   Title Patient will improve lower extremity strenght to 5/5 throughout in order to ambulate 10 steps with decreased fatigue.    Baseline 3 steps with fatigue    Time 6    Period Weeks    Status Partially Met      PT LONG TERM GOAL #2   Title Patient will improve distance by 400 feet in 6 minute walk test in order walk ambulate 2 miles around community park.    Time 6    Period Weeks    Status On-going      PT LONG TERM GOAL #3   Title Patient will be independent with land and aquatic HEP in order to perform safely at home.    Baseline inital HEP    Time 6    Period Weeks    Status Achieved                   Plan - 10/14/21 1053     Clinical Impression Statement Therapy reviewed his trigger points and how to perfrom self trigger point release at home. He had several large trigger points in his gluteals and QL. herapy reviewed trigger point web site and how these areas cane reffer. He tolerated exercises well. Therapy will continue to progress gym exercises as tolerated.  He has 2 more visits on his certification. He was advised it may be best to join the gym then we can work through any questions that he has.    Personal Factors and Comorbidities Fitness;Time since onset of injury/illness/exacerbation;Comorbidity 3+;Past/Current Experience    Comorbidities stent palcement, bilateral knee replacements; complete cervical fusion    Examination-Activity Limitations Squat;Stairs;Stand;Locomotion Level;Transfers    Examination-Participation Restrictions Community Activity;Shop;Meal Prep;Cleaning;Yard Work    [employer] Evolving/Moderate complexity    Rehab Potential Good    PT Frequency 2x / week    PT Duration 6 weeks    PT Treatment/Interventions ADLs/Self Care Home Management;Cryotherapy;Electrical Stimulation;Iontophoresis 33m/ml Dexamethasone;Moist Heat;Traction;Ultrasound;DME Instruction;Gait training;Stair training;Functional mobility training;Therapeutic activities;Therapeutic exercise;Neuromuscular re-education;Patient/family education;Manual techniques;Passive range of motion;Taping    PT Next Visit Plan 6MWT on land, gym based strength training    PT Home Exercise Plan Red Band: seated hip abduction 3x10, seated hip march 3x10, seated knee extension 3x10             Patient will benefit from skilled therapeutic intervention in order to improve the following deficits and impairments:  Abnormal gait, Decreased endurance, Decreased mobility, Difficulty walking, Increased muscle spasms, Pain, Decreased activity tolerance, Decreased knowledge of precautions, Decreased range of motion, Decreased strength, Increased fascial restricitons  Visit Diagnosis: Cervicalgia  Chronic bilateral low back pain without sciatica  Other abnormalities of gait and mobility   PHYSICAL THERAPY  DISCHARGE SUMMARY  Visits from Start of Care: 7  Current functional level related to goals / functional outcomes: Improved ability to exercise; improved  overall mobility    Remaining deficits: Continues to have pain walking distances   Education / Equipment: HEP    Patient agrees to discharge. Patient goals were partially met. Patient is being discharged due to maximized rehab potential.    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 [place], [place], [address] Phone: [phone]   Fax:  [phone]  Name: [name] MRN: [id number] Date of Birth: May 27, 1946

## 2021-10-21 DIAGNOSIS — H2513 Age-related nuclear cataract, bilateral: Secondary | ICD-10-CM | POA: Diagnosis not present

## 2021-10-25 NOTE — Progress Notes (Signed)
[place] Phone: [phone] Subjective:   Tony [name], am serving as a scribe for Dr. Hulan [name].  This visit occurred during the SARS-CoV-2 public health emergency.  Safety protocols were in place, including screening questions prior to the visit, additional usage of staff PPE, and extensive cleaning of exam room while observing appropriate contact time as indicated for disinfecting solutions.    I'm seeing this patient by the request  of:  Tony Low, MD  CC: cervical and lumbar pain follow up   JAS:[id number]  09/07/2021 As stated above.  Patient is making progress but it is slow.  Increase activity slowly.  Discussed icing regimen.  Continue to work with formal physical therapy now transition into more bland than aquatic therapy.  Patient will continue to work on the strengthening and they will start to increase some of the cardiovascular aspect.  Follow-up with me again in 6 to 8 weeks  Patient is making good progress at this moment.  Patient is still working with formal physical therapy.  We will start transitioning from the aquatic therapy to land therapy.  Do think that it will be significantly more beneficial.  Discussed icing regimen and home exercises.  Increase activity slowly.  Patient's goal is to be able to walk longer distances without fatigue.  Feels her balance has been making improvement.  Follow-up again in 6 to 8 weeks  Update 10/26/2021 Tony [name] is a 75 y.o. male coming in with complaint of cervical and lumbar spine pain. Patient states that he is the same as last visit. Physical therapy was helpful but patient is finished with sessions. Still feels unable to walk more than 100 yards which has not improved since doing physical therapy. Will develop pain in back and R hip with walking longer than 100 yards. Is able to climb stairs with greater strength. Is performing HEP.       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   Paroxysmal atrial flutter ([place])    Past Surgical History:  Procedure Laterality Date   ABDOMINAL EXPOSURE N/A 01/06/2020   Procedure: ABDOMINAL EXPOSURE;  Surgeon: Rosetta Posner, MD;  Location: [facility];  Service: Vascular;  Laterality: N/A;   ANTERIOR LUMBAR FUSION N/A 01/06/2020   Procedure: Lumbar Five- Sacral One  Anterior lumbar interbody fusion with infuse;  Surgeon: Kristeen Miss, MD;  Location: [facility];  Service: Neurosurgery;  Laterality: N/A;  Lumbar Five- Sacral One  Anterior lumbar interbody fusion with infuse   CERVICAL FUSION  1996, 1997   2 surgery- skull to C7   COLON RESECTION  2007   COLONOSCOPY W/ POLYPECTOMY     CORONARY ATHERECTOMY N/A 07/01/2021   Procedure: CORONARY ATHERECTOMY;  Surgeon: Burnell Blanks, MD;  Location: [facility];  Service: Cardiovascular;  Laterality: N/A;   CORONARY STENT INTERVENTION N/A 07/01/2021   Procedure: CORONARY STENT INTERVENTION;  Surgeon: Burnell Blanks, MD;  Location: [facility];  Service: Cardiovascular;  Laterality: N/A;   HEMORRHOID SURGERY N/A 01/13/2016   Procedure: HEMORRHOIDECTOMY AND REMOVAL OF ANAL SKIN TAG;  Surgeon: Coralie Keens, MD;  Location: [facility];  Service: General;  Laterality: N/A;   INTRAVASCULAR PRESSURE WIRE/FFR STUDY N/A 06/18/2021   Procedure: INTRAVASCULAR PRESSURE WIRE/FFR STUDY;  Surgeon: Burnell Blanks, MD;  Location: [facility];  Service: Cardiovascular;  Laterality: N/A;   INTRAVASCULAR ULTRASOUND/IVUS N/A  07/01/2021   Procedure: Intravascular Ultrasound/IVUS;  Surgeon: Burnell Blanks, MD;  Location: [facility];  Service: Cardiovascular;  Laterality: N/A;   REPLACEMENT TOTAL KNEE Left 2005   left   RIGHT/LEFT HEART CATH AND CORONARY ANGIOGRAPHY N/A 06/18/2021   Procedure: RIGHT/LEFT HEART CATH AND CORONARY ANGIOGRAPHY;  Surgeon: Burnell Blanks, MD;  Location: [facility];  Service: Cardiovascular;  Laterality: N/A;   SYNOVIAL CYST EXCISION     lumbar spine   TONSILLECTOMY     TOTAL KNEE ARTHROPLASTY Right 01/06/2017   Procedure: RIGHT TOTAL KNEE ARTHROPLASTY;  Surgeon: Sydnee Cabal, MD;  Location: [facility];  Service: Orthopedics;  Laterality: Right;  Adductior Block   Social History   Socioeconomic History   Marital status: Married    Spouse name: Tony [name]   Number of children: 4   Years of education: 12   Highest education level: Not on file  Occupational History   Occupation: Retired    Fish farm manager: RETIRED  Tobacco Use   Smoking status: Never   Smokeless tobacco: Never  Vaping Use   Vaping Use: Never used  Substance and Sexual Activity   Alcohol use: [name]   Drug use: [name]   Sexual activity: Not on file  Other Topics Concern   Not on file  Social History Narrative   Patient lives at home with Wife Tony [name].    Patient has 4 children.    Patient is currently not working. He is retired and disabled.    Patient has a high school education.    Social Determinants of Health   Financial Resource Strain: Not on file  Food Insecurity: Not on file  Transportation Needs: Not on file  Physical Activity: Not on file  Stress: Not on file  Social Connections: Not on file   Allergies  Allergen Reactions   Alfuzosin Other (See Comments)    Felt winded/faint   Gabapentin Nausea Only   Pregabalin Nausea Only and Other (See Comments)    Felt poorly   Tamsulosin Hcl Other (See Comments)    Orthostatic hypotension   Contrast Media [Iodinated Diagnostic Agents] Rash     Cath dye 06/18/21 rash started next day   Niacin Diarrhea   Family History  Problem Relation Age of Onset   Coronary artery disease Mother    Coronary artery disease Father    Colon cancer Neg Hx    Stomach cancer Neg Hx      Current Outpatient Medications (Cardiovascular):    amLODipine (NORVASC) 5 MG tablet, TAKE 1 TABLET BY MOUTH TWICE A DAY   benazepril (LOTENSIN) 20 MG tablet, Take 1 tablet (20 mg total) by mouth 2 (two) times daily.   hydrochlorothiazide (,MICROZIDE/HYDRODIURIL,) 12.5 MG capsule, Take 12.5 mg by mouth daily with lunch.    metoprolol tartrate (LOPRESSOR) 25 MG tablet, Take 25 mg by mouth daily as needed (afib.).    nitroGLYCERIN (NITROSTAT) 0.4 MG SL tablet, Place 1 tablet (0.4 mg total) under the tongue every 5 (five) minutes as needed.   rosuvastatin (CRESTOR)  10 MG tablet, Take 10 mg by mouth daily.   Current Outpatient Medications (Analgesics):    aspirin EC 81 MG tablet, Take 81 mg by mouth every evening. Swallow whole.   oxyCODONE (ROXICODONE) 5 MG immediate release tablet, Take 1-2 tablets (5-10 mg total) by mouth every 4 (four) hours as needed for severe pain or breakthrough pain.  Current Outpatient Medications (Hematological):    clopidogrel (PLAVIX) 75 MG tablet, Take 1 tablet (75 mg total) by mouth daily.  Current Outpatient Medications (Other):    B Complex-C (B-COMPLEX WITH VITAMIN C) tablet, Take 1 tablet by mouth daily with lunch.   Cholecalciferol (VITAMIN D3) 50 MCG (2000 UT) TABS, Take 2,000 Units by mouth daily with lunch. B complex, vitamin c 500   Glucosamine-Chondroitin (COSAMIN DS PO), Take 1 tablet by mouth every evening.   methocarbamol (ROBAXIN) 500 MG tablet, Take 500 mg by mouth every 8 (eight) hours as needed for muscle spasms.   Multiple Vitamin (MULTIVITAMIN WITH MINERALS) TABS tablet, Take 1 tablet by mouth daily with lunch. Adult 50+   scopolamine (TRANSDERM-SCOP) 1 MG/3DAYS, Place 1 patch onto the skin every three (3) days as  needed (nausea).   vitamin C (ASCORBIC ACID) 500 MG tablet, Take 500 mg by mouth daily with lunch.   Zinc 50 MG TABS, Take 50 mg by mouth every evening.   Reviewed prior external information including notes and imaging from  primary care provider As well as notes that were available from care everywhere and other healthcare systems.  This includes patient's ophthalmology notes from November 3 as well as physical therapy notes in great detail over the course of September and October.  Past medical history, social, surgical and family history all reviewed in electronic medical record.  [name] pertanent information unless stated regarding to the chief complaint.   Review of Systems:  [name] headache, visual changes, nausea, vomiting, diarrhea, constipation, dizziness, abdominal pain, skin rash, fevers, chills, night sweats, weight loss, swollen lymph nodes, body aches, joint swelling, chest pain, shortness of breath, mood changes. POSITIVE muscle aches  Objective  Blood pressure 136/72, pulse (!) 57, height 5\' 11"  (1.803 m), weight 258 lb (117 kg), SpO2 97 %.   General: [name] apparent distress alert and oriented x3 mood and affect normal, dressed appropriately.  HEENT: Pupils equal, extraocular movements intact  Respiratory: Patient's speak in full sentences and does not appear short of breath  Cardiovascular: [name] lower extremity edema, non tender, [name] erythema  MSK:  mild antalgic  Patient's [name] back does have some loss of lordosis.  Neurovascular intact distally.  Does have limited range of motion of the back overall.   Impression and Recommendations:     The above documentation has been reviewed and is accurate and complete Lyndal Pulley, DO

## 2021-10-26 ENCOUNTER — Other Ambulatory Visit: Payer: Self-pay

## 2021-10-26 ENCOUNTER — Ambulatory Visit (INDEPENDENT_AMBULATORY_CARE_PROVIDER_SITE_OTHER): Payer: Medicare Other | Admitting: Family Medicine

## 2021-10-26 ENCOUNTER — Encounter: Payer: Self-pay | Admitting: Family Medicine

## 2021-10-26 DIAGNOSIS — I2510 Atherosclerotic heart disease of native coronary artery without angina pectoris: Secondary | ICD-10-CM

## 2021-10-26 DIAGNOSIS — S32009K Unspecified fracture of unspecified lumbar vertebra, subsequent encounter for fracture with nonunion: Secondary | ICD-10-CM

## 2021-10-26 NOTE — Patient Instructions (Signed)
Glad you are improving Exercises on your own See me in 2 months can get more PT if needed at that time

## 2021-10-26 NOTE — Assessment & Plan Note (Addendum)
Patient has made improvement with the neck and low back pain.  Patient still having difficulty with walking long distances in the setting of his goals.  Encourage patient to try to do the exercises on his own and if he has difficulty with being noncompliant I do think in repeating physical therapy could be beneficial.  We did discuss different medications as well including potential Cymbalta in the long run that I think could be helpful as well if necessary.  Patient has had allergies to the gabapentin previously.  Patient would like to try to do the exercises on his home and follow-up with me again in 2 to 3 months for further evaluation and we will discuss restarting physical therapy if necessary.  Total time with patient reviewing patient's previous imaging as well as discussing different treatment options with patient 32 minutes

## 2021-11-30 ENCOUNTER — Telehealth: Payer: Self-pay | Admitting: *Deleted

## 2021-11-30 NOTE — Telephone Encounter (Signed)
° °  Pre-operative Risk Assessment    Patient Name: [name]  DOB: Jun 05, 1946 MRN: [id number]      Request for Surgical Clearance    Procedure:   LUMBAR SPINE L1-L2 ESI  Date of Surgery:  Clearance TBD                                 Surgeon:  DR. DAVE [facility] Surgeon's Group or Practice Name:  [facility] Phone number:  [phone] Fax number:  [phone]   Type of Clearance Requested:   - Medical  - Pharmacy:  Hold Clopidogrel (Plavix) x 7 DAYS PRIOR   Type of Anesthesia:  Not Indicated   Additional requests/questions:    Jiles Prows   11/30/2021, 11:47 AM

## 2021-11-30 NOTE — Telephone Encounter (Signed)
Dr. Harrington Challenger to review.  Patient has a history of hypertension, hyperlipidemia, paroxysmal atrial flutter and CAD.  Cardiac catheterization performed on 07/01/2021 revealed 80% proximal to mid LAD treated with 3 overlapping drug-eluting stents.  It was recommended he continue aspirin and Plavix for at least 12 months given long overlapping stent segment.  Of note, patient has chronic severe back pain.  He cannot walk long distance due to severe back pain.  He is interested to have epidural steroid injection to help alleviate the pain.  Since his stent placement was only 5 months ago, Dr. Harrington Challenger, please comment on what is the earliest time he may hold Plavix for the epidural steroid injection?  He says his back pain is not life-threatening but certainly is quite severe and debilitating.  He is willing to wait a few months if the risk of coming off of Plavix is too high, but he would also like to down to the earliest time he may hold Plavix so he can have some relief from the back pain. Otherwise he is doing well from cardiac perspective with no chest pain or worsening dyspnea on exertion.   Please forward your response to [facility] PREOP

## 2021-12-02 NOTE — Telephone Encounter (Signed)
I s/w the pt and he is aware ok to hold Plavix x 7 days prior to injection per Dr. Harrington Challenger. Notes have been faxed to requesting office. Pt aware he will resume Plavix once felt safe from MD performing procedure. Pt thanked me for the call and the help. Pt also stated he wanted to me to pass on a message to Dr. Harrington Challenger for him. Dr. Harrington Challenger pt says Mikki Santee and thank you for taking such great care of him.   I will forward this note to Dr. Harrington Challenger to pass along the pt's message.   I will fax notes to requesting office.

## 2021-12-02 NOTE — Telephone Encounter (Signed)
° °  Patient Name: [name]  DOB: 1946/03/19 MRN: [id number]  Primary Cardiologist: [name], MD  Chart revisited as part of pre-operative protocol coverage. As below [employer] PA-C had reviewed symptoms with patient and he was felt to be doing well from cardiac standpoint. Isaac Laud did reach out to Dr. Harrington Challenger to clarify the issue of holding Plavix given recent PCI 5 months ago. Per Dr. Alan Ripper reply, "I have reviewed with interventionalist    Pt is  5 months from a nonemergeent procedure    Should be OK/low risk to hold antiplatelet REsume when safe post surgery ."  I tried to call the patient to let him know Dr. Alan Ripper response but got VM. Will route to callback to simply be aware to let him know that Dr. Harrington Challenger gave this clearance when he calls back. Otherwise I will route this bundled recommendation to requesting provider via Epic fax function. Please call with questions.  Charlie Pitter, PA-C 12/02/2021, 4:47 PM

## 2021-12-02 NOTE — Telephone Encounter (Signed)
I have reviewed with interventionalist    Pt is  5 months from a nonemergeent procedure    Should be OK/low risk to hold antiplatelet REsume when safe post surgery

## 2021-12-09 DIAGNOSIS — Z03818 Encounter for observation for suspected exposure to other biological agents ruled out: Secondary | ICD-10-CM | POA: Diagnosis not present

## 2021-12-09 DIAGNOSIS — J029 Acute pharyngitis, unspecified: Secondary | ICD-10-CM | POA: Diagnosis not present

## 2021-12-09 DIAGNOSIS — R051 Acute cough: Secondary | ICD-10-CM | POA: Diagnosis not present

## 2021-12-09 DIAGNOSIS — J101 Influenza due to other identified influenza virus with other respiratory manifestations: Secondary | ICD-10-CM | POA: Diagnosis not present

## 2021-12-22 ENCOUNTER — Telehealth: Payer: Self-pay | Admitting: Internal Medicine

## 2021-12-22 NOTE — Telephone Encounter (Signed)
° °  Pt would like to inquire if he can get a covid shot since he recently got stent put in

## 2021-12-22 NOTE — Telephone Encounter (Signed)
Pt had stent in 06/2022...Marland Kitchen pt advised he can get the COVID vaccine if he decides to get it.

## 2021-12-24 NOTE — Progress Notes (Signed)
[place] Phone: [phone] Subjective:   [name], am serving as a scribe for Dr. Hulan Saas. This visit occurred during the SARS-CoV-2 public health emergency.  Safety protocols were in place, including screening questions prior to the visit, additional usage of staff PPE, and extensive cleaning of exam room while observing appropriate contact time as indicated for disinfecting solutions.  I'm seeing this patient by the request  of:  [name], MD  CC: low back pain f/u  HAL:[id number]  10/26/2021 Patient has made improvement with the neck and low back pain.  Patient still having difficulty with walking long distances in the setting of his goals.  Encourage patient to try to do the exercises on his own and if he has difficulty with being noncompliant I do think in repeating physical therapy could be beneficial.  We did discuss different medications as well including potential Cymbalta in the long run that I think could be helpful as well if necessary.  Patient has had allergies to the gabapentin previously.  Patient would like to try to do the exercises on his home and follow-up with me again in 2 to 3 months for further evaluation and we will discuss restarting physical therapy if necessary.  Total time with patient reviewing patient's previous imaging as well as discussing different treatment options with patient 32 minutes  Update 12/28/2021 ISHAN SANROMAN is a 76 y.o. male coming in with complaint of lumbar spine pain. Patient states that he has been going to [employer] 3x a week to workout and continue his exercises that he learned in PT. Feels that his back pain in unchanged. States that pain is 5/10 every day. Is going to have epidural on 1/17 with Dr. Ellene Route. Does get some relief from these injections. Believes that his last epidural was maybe 1 year ago. Patient is concerned about his posture and slowly  becoming in an forward flexion position in lumbar and cervical spine.        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   Paroxysmal atrial flutter ([place])    Past Surgical History:  Procedure Laterality Date   ABDOMINAL EXPOSURE N/A 01/06/2020   Procedure: ABDOMINAL EXPOSURE;  Surgeon: Rosetta Posner, MD;  [facility];  Service: Vascular;  Laterality: N/A;   ANTERIOR LUMBAR FUSION N/A 01/06/2020   Procedure: Lumbar Five- Sacral One  Anterior lumbar interbody fusion with infuse;  Surgeon: Kristeen Miss, MD;  Location: [place];  Service: Neurosurgery;  Laterality: N/A;  Lumbar Five- Sacral One  Anterior lumbar interbody fusion with infuse   CERVICAL FUSION  1996, 1997   2 surgery- skull to C7   COLON RESECTION  2007   COLONOSCOPY W/ POLYPECTOMY     CORONARY ATHERECTOMY N/A 07/01/2021   Procedure: CORONARY ATHERECTOMY;  Surgeon: Burnell Blanks, MD;  Location: [place] [facility];  Service: Cardiovascular;  Laterality: N/A;   CORONARY STENT INTERVENTION N/A 07/01/2021   Procedure:  CORONARY STENT INTERVENTION;  Surgeon: Burnell Blanks, MD;  Location: [place] [facility];  Service: Cardiovascular;  Laterality: N/A;   HEMORRHOID SURGERY N/A 01/13/2016   Procedure: HEMORRHOIDECTOMY AND REMOVAL OF ANAL SKIN TAG;  Surgeon: Coralie Keens, MD;  Location: [place];  Service: General;  Laterality: N/A;   INTRAVASCULAR PRESSURE WIRE/FFR STUDY N/A 06/18/2021   Procedure: INTRAVASCULAR PRESSURE WIRE/FFR STUDY;  Surgeon: Burnell Blanks, MD;  Location: [place] [facility];   Service: Cardiovascular;  Laterality: N/A;   INTRAVASCULAR ULTRASOUND/IVUS N/A 07/01/2021   Procedure: Intravascular Ultrasound/IVUS;  Surgeon: Burnell Blanks, MD;  Location: [place] [facility];  Service: Cardiovascular;  Laterality: N/A;   REPLACEMENT TOTAL KNEE Left 2005   left   RIGHT/LEFT HEART CATH AND CORONARY ANGIOGRAPHY N/A 06/18/2021   Procedure: RIGHT/LEFT HEART CATH AND CORONARY ANGIOGRAPHY;  Surgeon: Burnell Blanks, MD;  Location: [place] [facility];  Service: Cardiovascular;  Laterality: N/A;   SYNOVIAL CYST EXCISION     lumbar spine   TONSILLECTOMY     TOTAL KNEE ARTHROPLASTY Right 01/06/2017   Procedure: RIGHT TOTAL KNEE ARTHROPLASTY;  Surgeon: Sydnee Cabal, MD;  Location: [facility];  Service: Orthopedics;  Laterality: Right;  Adductior Block   Social History   Socioeconomic History   Marital status: Married    Spouse name: Zigmund Daniel   Number of children: 4   Years of education: 12   Highest education level: Not on file  Occupational History   Occupation: Retired    Fish farm manager: RETIRED  Tobacco Use   Smoking status: Never   Smokeless tobacco: Never  Vaping Use   Vaping Use: Never used  Substance and Sexual Activity   Alcohol use: No   Drug use: No   Sexual activity: Not on file  Other Topics Concern   Not on file  Social History Narrative   Patient lives at home with Wife Zigmund Daniel.    Patient has 4 children.    Patient is currently not working. He is retired and disabled.    Patient has a high school education.    Social Determinants of Health   Financial Resource Strain: Not on file  Food Insecurity: Not on file  Transportation Needs: Not on file  Physical Activity: Not on file  Stress: Not on file  Social Connections: Not on file   Allergies  Allergen Reactions   Alfuzosin Other (See Comments)    Felt winded/faint   Gabapentin Nausea Only   Pregabalin Nausea Only and Other (See Comments)    Felt poorly   Tamsulosin Hcl Other (See Comments)     Orthostatic hypotension   Contrast Media [Iodinated Contrast Media] Rash    Cath dye 06/18/21 rash started next day   Niacin Diarrhea   Family History  Problem Relation Age of Onset   Coronary artery disease Mother    Coronary artery disease Father    Colon cancer Neg Hx    Stomach cancer Neg Hx      Current Outpatient Medications (Cardiovascular):    amLODipine (NORVASC) 5 MG tablet, TAKE 1 TABLET BY MOUTH TWICE A DAY   benazepril (LOTENSIN) 20 MG tablet, Take 1 tablet (20 mg total) by mouth 2 (two) times daily.   hydrochlorothiazide (,MICROZIDE/HYDRODIURIL,) 12.5 MG capsule, Take 12.5 mg by mouth daily with lunch.    metoprolol tartrate (LOPRESSOR) 25 MG tablet, Take 25 mg by mouth daily as needed (afib.).    nitroGLYCERIN (NITROSTAT) 0.4 MG SL tablet, Place 1 tablet (0.4 mg total)  under the tongue every 5 (five) minutes as needed.   rosuvastatin (CRESTOR) 10 MG tablet, Take 10 mg by mouth daily.   Current Outpatient Medications (Analgesics):    aspirin EC 81 MG tablet, Take 81 mg by mouth every evening. Swallow whole.   oxyCODONE (ROXICODONE) 5 MG immediate release tablet, Take 1-2 tablets (5-10 mg total) by mouth every 4 (four) hours as needed for severe pain or breakthrough pain.  Current Outpatient Medications (Hematological):    clopidogrel (PLAVIX) 75 MG tablet, Take 1 tablet (75 mg total) by mouth daily.  Current Outpatient Medications (Other):    B Complex-C (B-COMPLEX WITH VITAMIN C) tablet, Take 1 tablet by mouth daily with lunch.   Cholecalciferol (VITAMIN D3) 50 MCG (2000 UT) TABS, Take 2,000 Units by mouth daily with lunch. B complex, vitamin c 500   Glucosamine-Chondroitin (COSAMIN DS PO), Take 1 tablet by mouth every evening.   methocarbamol (ROBAXIN) 500 MG tablet, Take 500 mg by mouth every 8 (eight) hours as needed for muscle spasms.   Multiple Vitamin (MULTIVITAMIN WITH MINERALS) TABS tablet, Take 1 tablet by mouth daily with lunch. Adult 50+   scopolamine  (TRANSDERM-SCOP) 1 MG/3DAYS, Place 1 patch onto the skin every three (3) days as needed (nausea).   vitamin C (ASCORBIC ACID) 500 MG tablet, Take 500 mg by mouth daily with lunch.   Zinc 50 MG TABS, Take 50 mg by mouth every evening.   Reviewed prior external information including notes and imaging from  primary care provider As well as notes that were available from care everywhere and other healthcare systems.  Past medical history, social, surgical and family history all reviewed in electronic medical record.  No pertanent information unless stated regarding to the chief complaint.   Review of Systems:  No headache, visual changes, nausea, vomiting, diarrhea, constipation, dizziness, abdominal pain, skin rash, fevers, chills, night sweats, weight loss, swollen lymph nodes, body aches, joint swelling, chest pain, shortness of breath, mood changes. POSITIVE muscle aches  Objective  Blood pressure 128/80, pulse 78, height 5\' 11"  (1.803 m), weight 260 lb (117.9 kg), SpO2 97 %.   General: No apparent distress alert and oriented x3 mood and affect normal, dressed appropriately.  HEENT: Pupils equal, extraocular movements intact  Respiratory: Patient's speak in full sentences and does not appear short of breath  Cardiovascular: No lower extremity edema, non tender, no erythema  Gait normal with good balance and coordination.  MSK: Patient's low back does have significant loss of lordosis.  Patient has difficulty and cannot even get to neutral position with standing.  Patient in a flexed position.  Significant tightness noted of the hamstrings of the legs.  Neurovascularly intact distally.  Diffusely tender to palpation in the paraspinal musculature of the lumbar spine    Impression and Recommendations:     The above documentation has been reviewed and is accurate and complete Lyndal Pulley, DO

## 2021-12-28 ENCOUNTER — Ambulatory Visit (INDEPENDENT_AMBULATORY_CARE_PROVIDER_SITE_OTHER): Payer: Medicare Other | Admitting: Family Medicine

## 2021-12-28 ENCOUNTER — Encounter: Payer: Self-pay | Admitting: Family Medicine

## 2021-12-28 ENCOUNTER — Other Ambulatory Visit: Payer: Self-pay

## 2021-12-28 DIAGNOSIS — S32009K Unspecified fracture of unspecified lumbar vertebra, subsequent encounter for fracture with nonunion: Secondary | ICD-10-CM | POA: Diagnosis not present

## 2021-12-28 DIAGNOSIS — L0292 Furuncle, unspecified: Secondary | ICD-10-CM | POA: Diagnosis not present

## 2021-12-28 NOTE — Assessment & Plan Note (Signed)
Do believe that patient does have more of adjacent segment disease of the back.  Is having another epidural from another provider.  Do think that this is a good idea.  We discussed the possibility at great length of different medications including Cymbalta.  Patient wants to hold at the moment but will consider it.  We also discussed maybe the possibility of spinal stimulator but I think would not make a significant improvement.  Discussed with patient to continue to stay active.  Patient does feel that working out does help him at least psychologically.  Follow-up again 6 weeks after the epidural to discuss in greater detail.  Total time with patient discussing with him 31 minutes.

## 2021-12-28 NOTE — Patient Instructions (Signed)
Follow up with Eslner Ask him about spinal stimulator Read about Cymbalta See me 5-6 weeks after injection

## 2022-01-04 DIAGNOSIS — M5116 Intervertebral disc disorders with radiculopathy, lumbar region: Secondary | ICD-10-CM | POA: Diagnosis not present

## 2022-01-04 DIAGNOSIS — M5416 Radiculopathy, lumbar region: Secondary | ICD-10-CM | POA: Diagnosis not present

## 2022-01-28 ENCOUNTER — Encounter: Payer: Self-pay | Admitting: Urology

## 2022-01-28 ENCOUNTER — Other Ambulatory Visit: Payer: Self-pay

## 2022-01-28 ENCOUNTER — Ambulatory Visit (INDEPENDENT_AMBULATORY_CARE_PROVIDER_SITE_OTHER): Payer: Medicare Other | Admitting: Urology

## 2022-01-28 VITALS — BP 145/71 | HR 77 | Wt 256.0 lb

## 2022-01-28 DIAGNOSIS — N401 Enlarged prostate with lower urinary tract symptoms: Secondary | ICD-10-CM | POA: Diagnosis not present

## 2022-01-28 DIAGNOSIS — N138 Other obstructive and reflux uropathy: Secondary | ICD-10-CM

## 2022-01-28 DIAGNOSIS — N411 Chronic prostatitis: Secondary | ICD-10-CM

## 2022-01-28 DIAGNOSIS — R300 Dysuria: Secondary | ICD-10-CM | POA: Diagnosis not present

## 2022-01-28 LAB — URINALYSIS, ROUTINE W REFLEX MICROSCOPIC
Bilirubin, UA: NEGATIVE
Glucose, UA: NEGATIVE
Leukocytes,UA: NEGATIVE
Nitrite, UA: NEGATIVE
Protein,UA: NEGATIVE
RBC, UA: NEGATIVE
Specific Gravity, UA: 1.025 (ref 1.005–1.030)
Urobilinogen, Ur: 1 mg/dL (ref 0.2–1.0)
pH, UA: 5.5 (ref 5.0–7.5)

## 2022-01-28 NOTE — Patient Instructions (Signed)

## 2022-01-28 NOTE — Progress Notes (Signed)
01/28/2022 1:07 PM   [name] Mar 22, 1946 [id number]  Referring provider: [name], MD [address],  [place] [address]  Followup BPH   HPI: [name] is a 76yo here for followup for BPH and chronic prostatitis. IPSS 3 QOL 0 on no BPH therapy. No straining to urinate. Urine stream strong. No hematuria or dysuria.  No recent PSA. NO flares of his prostatitis. 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   Paroxysmal atrial flutter ([place])     Surgical History: Past Surgical History:  Procedure Laterality Date   ABDOMINAL EXPOSURE N/A 01/06/2020   Procedure: ABDOMINAL EXPOSURE;  Surgeon: Rosetta Posner, MD;  [facility];  Service: Vascular;  Laterality: N/A;   ANTERIOR LUMBAR FUSION N/A 01/06/2020   Procedure: Lumbar Five- Sacral One  Anterior lumbar interbody fusion with infuse;  Surgeon: Kristeen Miss, MD;  Location: [place];  Service: Neurosurgery;  Laterality: N/A;  Lumbar Five- Sacral One  Anterior lumbar interbody fusion with infuse   CERVICAL FUSION  1996, 1997   2 surgery- skull to C7   COLON RESECTION  2007   COLONOSCOPY W/ POLYPECTOMY     CORONARY ATHERECTOMY N/A 07/01/2021   Procedure: CORONARY ATHERECTOMY;  Surgeon: Burnell Blanks, MD;  Location: [place] [facility];  Service: Cardiovascular;  Laterality: N/A;   CORONARY STENT INTERVENTION N/A 07/01/2021   Procedure: CORONARY STENT INTERVENTION;  Surgeon: Burnell Blanks, MD;  Location: [place] [facility];  Service:  Cardiovascular;  Laterality: N/A;   HEMORRHOID SURGERY N/A 01/13/2016   Procedure: HEMORRHOIDECTOMY AND REMOVAL OF ANAL SKIN TAG;  Surgeon: Coralie Keens, MD;  Location: [place] [place];  Service: General;  Laterality: N/A;   INTRAVASCULAR PRESSURE WIRE/FFR STUDY N/A 06/18/2021   Procedure: INTRAVASCULAR PRESSURE WIRE/FFR STUDY;  Surgeon: Burnell Blanks, MD;  Location: [place] [facility];  Service: Cardiovascular;  Laterality: N/A;   INTRAVASCULAR ULTRASOUND/IVUS N/A 07/01/2021   Procedure: Intravascular Ultrasound/IVUS;  Surgeon: Burnell Blanks, MD;  Location: [place] [facility];  Service: Cardiovascular;  Laterality: N/A;   REPLACEMENT TOTAL KNEE Left 2005   left   RIGHT/LEFT HEART CATH AND CORONARY ANGIOGRAPHY N/A 06/18/2021   Procedure: RIGHT/LEFT HEART CATH AND CORONARY ANGIOGRAPHY;  Surgeon: Burnell Blanks, MD;  Location: [place] [facility];  Service: Cardiovascular;  Laterality: N/A;   SYNOVIAL CYST EXCISION     lumbar spine   TONSILLECTOMY     TOTAL KNEE ARTHROPLASTY Right 01/06/2017   Procedure: RIGHT TOTAL KNEE ARTHROPLASTY;  Surgeon: Sydnee Cabal, MD;  [facility];  Service: Orthopedics;  Laterality: Right;  Adductior Block    Home Medications:  Allergies as of 01/28/2022       Reactions   Alfuzosin Other (See Comments)   Felt winded/faint   Gabapentin Nausea Only   Pregabalin Nausea Only, Other (See Comments)   Felt poorly   Tamsulosin Hcl Other (See Comments)   Orthostatic hypotension   Contrast  Media [iodinated Contrast Media] Rash   Cath dye 06/18/21 rash started next day   Niacin Diarrhea        Medication List        Accurate as of January 28, 2022  1:07 PM. If you have any questions, ask your nurse or doctor.          amLODipine 5 MG tablet Commonly known as: NORVASC TAKE 1 TABLET BY MOUTH TWICE A DAY   aspirin EC 81 MG tablet Take 81 mg by mouth every evening. Swallow whole.   B-complex with vitamin C tablet Take 1 tablet by  mouth daily with lunch.   benazepril 20 MG tablet Commonly known as: LOTENSIN Take 1 tablet (20 mg total) by mouth 2 (two) times daily.   clopidogrel 75 MG tablet Commonly known as: PLAVIX Take 1 tablet (75 mg total) by mouth daily.   COSAMIN DS PO Take 1 tablet by mouth every evening.   hydrochlorothiazide 12.5 MG capsule Commonly known as: MICROZIDE Take 12.5 mg by mouth daily with lunch.   methocarbamol 500 MG tablet Commonly known as: ROBAXIN Take 500 mg by mouth every 8 (eight) hours as needed for muscle spasms.   metoprolol tartrate 25 MG tablet Commonly known as: LOPRESSOR Take 25 mg by mouth daily as needed (afib.).   multivitamin with minerals Tabs tablet Take 1 tablet by mouth daily with lunch. Adult 50+   nitroGLYCERIN 0.4 MG SL tablet Commonly known as: Nitrostat Place 1 tablet (0.4 mg total) under the tongue every 5 (five) minutes as needed.   oxyCODONE 5 MG immediate release tablet Commonly known as: Roxicodone Take 1-2 tablets (5-10 mg total) by mouth every 4 (four) hours as needed for severe pain or breakthrough pain.   rosuvastatin 10 MG tablet Commonly known as: CRESTOR Take 10 mg by mouth daily.   scopolamine 1 MG/3DAYS Commonly known as: TRANSDERM-SCOP Place 1 patch onto the skin every three (3) days as needed (nausea).   vitamin C 500 MG tablet Commonly known as: ASCORBIC ACID Take 500 mg by mouth daily with lunch.   Vitamin D3 50 MCG (2000 UT) Tabs Take 2,000 Units by mouth daily with lunch. B complex, vitamin c 500   Zinc 50 MG Tabs Take 50 mg by mouth every evening.        Allergies:  Allergies  Allergen Reactions   Alfuzosin Other (See Comments)    Felt winded/faint   Gabapentin Nausea Only   Pregabalin Nausea Only and Other (See Comments)    Felt poorly   Tamsulosin Hcl Other (See Comments)    Orthostatic hypotension   Contrast Media [Iodinated Contrast Media] Rash    Cath dye 06/18/21 rash started next day   Niacin  Diarrhea    Family History: Family History  Problem Relation Age of Onset   Coronary artery disease Mother    Coronary artery disease Father    Colon cancer Neg Hx    Stomach cancer Neg Hx     Social History:  reports that he has never smoked. He has never used smokeless tobacco. He reports that he does not drink alcohol and does not use drugs.  ROS: All other review of systems were reviewed and are negative except what is noted above in HPI  Physical Exam: BP (!) 145/71    Pulse 77    Wt 256 lb (116.1 kg)    BMI 35.70 kg/m   Constitutional:  Alert and oriented, No acute distress. HEENT: [place] AT,  moist mucus membranes.  Trachea midline, no masses. Cardiovascular: No clubbing, cyanosis, or edema. Respiratory: Normal respiratory effort, no increased work of breathing. GI: Abdomen is soft, nontender, nondistended, no abdominal masses GU: No CVA tenderness. Circumcised phallus. No masses/lesions on penis, testis, scrotum. Prostate 40g smooth no nodules no induration.  Lymph: No cervical or inguinal lymphadenopathy. Skin: No rashes, bruises or suspicious lesions. Neurologic: Grossly intact, no focal deficits, moving all 4 extremities. Psychiatric: Normal mood and affect.  Laboratory Data: Lab Results  Component Value Date   WBC 8.1 06/28/2021   HGB 13.3 06/28/2021   HCT 39.8 06/28/2021   MCV 95 06/28/2021   PLT 186 06/28/2021    Lab Results  Component Value Date   CREATININE 1.17 10/04/2021    Lab Results  Component Value Date   PSA 0.5 09/05/2016   PSA 0.49 09/10/2015    No results found for: TESTOSTERONE  Lab Results  Component Value Date   HGBA1C 5.3 09/05/2016    Urinalysis    Component Value Date/Time   COLORURINE AMBER (A) 12/30/2016 1046   APPEARANCEUR Clear 07/27/2020 1119   LABSPEC 1.020 12/30/2016 1046   PHURINE 5.0 12/30/2016 1046   GLUCOSEU Negative 07/27/2020 1119   HGBUR NEGATIVE 12/30/2016 1046   BILIRUBINUR Negative 07/27/2020 1119    KETONESUR NEGATIVE 12/30/2016 1046   PROTEINUR 1+ (A) 07/27/2020 1119   PROTEINUR NEGATIVE 12/30/2016 1046   UROBILINOGEN 0.2 08/31/2007 1221   NITRITE Negative 07/27/2020 1119   NITRITE NEGATIVE 12/30/2016 1046   LEUKOCYTESUR Negative 07/27/2020 1119    Lab Results  Component Value Date   LABMICR See below: 07/27/2020   WBCUA None seen 07/27/2020   LABEPIT 0-10 07/27/2020   BACTERIA None seen 07/27/2020    Pertinent Imaging:  No results found for this or any previous visit.  No results found for this or any previous visit.  No results found for this or any previous visit.  No results found for this or any previous visit.  No results found for this or any previous visit.  No results found for this or any previous visit.  No results found for this or any previous visit.  No results found for this or any previous visit.   Assessment & Plan:    1. Benign prostatic hyperplasia with urinary obstruction -Patient defers therapy at this time since he has mild LUTS with no bother  2. Chronic prostatitis without hematuria -continue observation  3. Dysuria -resolved   No follow-ups on file.  Nicolette Bang, MD  [facility] [place]

## 2022-01-29 LAB — PSA: Prostate Specific Ag, Serum: 0.7 ng/mL (ref 0.0–4.0)

## 2022-02-15 NOTE — Progress Notes (Signed)
[place] [facility] Phone: [phone] Subjective:   [name], am serving as a scribe for Dr. Hulan Saas.  This visit occurred during the SARS-CoV-2 public health emergency.  Safety protocols were in place, including screening questions prior to the visit, additional usage of staff PPE, and extensive cleaning of exam room while observing appropriate contact time as indicated for disinfecting solutions.  I'm seeing this patient by the request  of:  [name], MD  CC: Low back pain follow-up  [id number]  12/28/2021 Do believe that patient does have more of adjacent segment disease of the back.  Is having another epidural from another provider.  Do think that this is a good idea.  We discussed the possibility at great length of different medications including Cymbalta.  Patient wants to hold at the moment but will consider it.  We also discussed maybe the possibility of spinal stimulator but I think would not make a significant improvement.  Discussed with patient to continue to stay active.  Patient does feel that working out does help him at least psychologically.  Follow-up again 6 weeks after the epidural to discuss in greater detail.  Total time with patient discussing with him 31 minutes.  Update 02/16/2022 [name] is a 76 y.o. male coming in with complaint of lumbar spine pain. Patient states that he had epidural from Dr. Ellene Route and did not have pain for 28 days. Within 2-3 days his pain increased to pre-epidural levels. Will see him again near end of March.  Known severe spinal stenosis.  Patient has talked to a couple more friends as well as his wife being on Cymbalta but does not know if he wants to make that change yet.  Joined [employer] and has been working out 3 days a week. Doing HEP.  Is doing elliptical for 12 minutes after only being able to do 1 minute.   Would like to be able to walk further. Can  only walk 110 yards due to knee and back pain.        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   Paroxysmal atrial flutter ([place])    Past Surgical History:  Procedure Laterality Date   ABDOMINAL EXPOSURE N/A 01/06/2020   Procedure: ABDOMINAL EXPOSURE;  Surgeon: Rosetta Posner, MD;  Location: [facility];  Service: Vascular;  Laterality: N/A;   ANTERIOR LUMBAR FUSION N/A 01/06/2020   Procedure: Lumbar Five- Sacral One  Anterior lumbar interbody fusion with infuse;  Surgeon: Kristeen Miss, MD;  Location: [facility];  Service: Neurosurgery;  Laterality: N/A;  Lumbar Five- Sacral One  Anterior lumbar interbody fusion with infuse   CERVICAL FUSION  1996, 1997   2 surgery- skull to C7   COLON RESECTION  2007   COLONOSCOPY W/ POLYPECTOMY     CORONARY ATHERECTOMY N/A 07/01/2021   Procedure: CORONARY ATHERECTOMY;  Surgeon: Burnell Blanks, MD;  Location: [facility];  Service: Cardiovascular;  Laterality:  N/A;   CORONARY STENT INTERVENTION N/A 07/01/2021   Procedure: CORONARY STENT INTERVENTION;  Surgeon: Burnell Blanks, MD;  Location: [facility];  Service: Cardiovascular;  Laterality: N/A;   HEMORRHOID SURGERY N/A 01/13/2016   Procedure: HEMORRHOIDECTOMY AND REMOVAL OF ANAL SKIN TAG;  Surgeon: Coralie Keens, MD;  Location: [facility];  Service: General;  Laterality: N/A;   INTRAVASCULAR PRESSURE WIRE/FFR STUDY N/A 06/18/2021   Procedure: INTRAVASCULAR PRESSURE WIRE/FFR STUDY;  Surgeon: Burnell Blanks, MD;  Location: [facility];  Service:  Cardiovascular;  Laterality: N/A;   INTRAVASCULAR ULTRASOUND/IVUS N/A 07/01/2021   Procedure: Intravascular Ultrasound/IVUS;  Surgeon: Burnell Blanks, MD;  Location: [facility];  Service: Cardiovascular;  Laterality: N/A;   REPLACEMENT TOTAL KNEE Left 2005   left   RIGHT/LEFT HEART CATH AND CORONARY ANGIOGRAPHY N/A 06/18/2021   Procedure: RIGHT/LEFT HEART CATH AND CORONARY ANGIOGRAPHY;  Surgeon: Burnell Blanks, MD;  Location: [facility];  Service: Cardiovascular;  Laterality: N/A;   SYNOVIAL CYST EXCISION     lumbar spine   TONSILLECTOMY     TOTAL KNEE ARTHROPLASTY Right 01/06/2017   Procedure: RIGHT TOTAL KNEE ARTHROPLASTY;  Surgeon: Sydnee Cabal, MD;  Location: [facility];  Service: Orthopedics;  Laterality: Right;  Adductior Block   Social History   Socioeconomic History   Marital status: Married    Spouse name: Zigmund Daniel   Number of children: 4   Years of education: 12   Highest education level: Not on file  Occupational History   Occupation: Retired    Fish farm manager: RETIRED  Tobacco Use   Smoking status: Never   Smokeless tobacco: Never  Vaping Use   Vaping Use: Never used  Substance and Sexual Activity   Alcohol use: [name]   Drug use: [name]   Sexual activity: Not on file  Other Topics Concern   Not on file  Social History Narrative   Patient lives at home with Wife Zigmund Daniel.    Patient has 4 children.    Patient is currently not working. He is retired and disabled.    Patient has a high school education.    Social Determinants of Health   Financial Resource Strain: Not on file  Food Insecurity: Not on file  Transportation Needs: Not on file  Physical Activity: Not on file  Stress: Not on file  Social Connections: Not on file   Allergies  Allergen Reactions   Alfuzosin Other (See Comments)    Felt winded/faint   Gabapentin Nausea Only   Pregabalin Nausea Only and Other (See Comments)    Felt poorly   Tamsulosin Hcl Other (See Comments)     Orthostatic hypotension   Contrast Media [Iodinated Contrast Media] Rash    Cath dye 06/18/21 rash started next day   Niacin Diarrhea   Family History  Problem Relation Age of Onset   Coronary artery disease Mother    Coronary artery disease Father    Colon cancer Neg Hx    Stomach cancer Neg Hx      Current Outpatient Medications (Cardiovascular):    amLODipine (NORVASC) 5 MG tablet, TAKE 1 TABLET BY MOUTH TWICE A DAY   benazepril (LOTENSIN) 20 MG tablet, Take 1 tablet (20 mg total) by mouth 2 (two) times daily.   hydrochlorothiazide (,MICROZIDE/HYDRODIURIL,) 12.5 MG capsule, Take 12.5 mg by mouth daily with lunch.    metoprolol tartrate (LOPRESSOR) 25 MG tablet, Take 25 mg by mouth daily as needed (afib.).    nitroGLYCERIN (  NITROSTAT) 0.4 MG SL tablet, Place 1 tablet (0.4 mg total) under the tongue every 5 (five) minutes as needed.   rosuvastatin (CRESTOR) 10 MG tablet, Take 10 mg by mouth daily.   Current Outpatient Medications (Analgesics):    aspirin EC 81 MG tablet, Take 81 mg by mouth every evening. Swallow whole.   oxyCODONE (ROXICODONE) 5 MG immediate release tablet, Take 1-2 tablets (5-10 mg total) by mouth every 4 (four) hours as needed for severe pain or breakthrough pain.  Current Outpatient Medications (Hematological):    clopidogrel (PLAVIX) 75 MG tablet, Take 1 tablet (75 mg total) by mouth daily.  Current Outpatient Medications (Other):    B Complex-C (B-COMPLEX WITH VITAMIN C) tablet, Take 1 tablet by mouth daily with lunch.   Cholecalciferol (VITAMIN D3) 50 MCG (2000 UT) TABS, Take 2,000 Units by mouth daily with lunch. B complex, vitamin c 500   Glucosamine-Chondroitin (COSAMIN DS PO), Take 1 tablet by mouth every evening.   methocarbamol (ROBAXIN) 500 MG tablet, Take 500 mg by mouth every 8 (eight) hours as needed for muscle spasms.   Multiple Vitamin (MULTIVITAMIN WITH MINERALS) TABS tablet, Take 1 tablet by mouth daily with lunch. Adult 50+   scopolamine  (TRANSDERM-SCOP) 1 MG/3DAYS, Place 1 patch onto the skin every three (3) days as needed (nausea).   vitamin C (ASCORBIC ACID) 500 MG tablet, Take 500 mg by mouth daily with lunch.   Zinc 50 MG TABS, Take 50 mg by mouth every evening.   Review of Systems:  [name] headache, visual changes, nausea, vomiting, diarrhea, constipation, dizziness, abdominal pain, skin rash, fevers, chills, night sweats, weight loss, swollen lymph nodes, joint swelling, chest pain, shortness of breath, mood changes. POSITIVE muscle aches, body aches  Objective  Blood pressure 110/60, pulse 60, height 5\' 11"  (1.803 m), weight 249 lb (112.9 kg), SpO2 96 %.   General: [name] apparent distress alert and oriented x3 mood and affect normal, dressed appropriately.  HEENT: Pupils equal, extraocular movements intact  Respiratory: Patient's speak in full sentences and does not appear short of breath  Cardiovascular: [name] lower extremity edema, non tender, [name] erythema  Gait antalgic with shuffling gait MSK: Low back exam does have some loss of lordosis.  Patient does have some tenderness to palpation still in the paraspinal musculature.  Only has approximately 5 degrees of extension noted.  Neurovascularly intact distally.    Impression and Recommendations:     The above documentation has been reviewed and is accurate and complete Lyndal Pulley, DO

## 2022-02-16 ENCOUNTER — Ambulatory Visit (INDEPENDENT_AMBULATORY_CARE_PROVIDER_SITE_OTHER): Payer: Medicare Other | Admitting: Family Medicine

## 2022-02-16 ENCOUNTER — Other Ambulatory Visit: Payer: Self-pay

## 2022-02-16 ENCOUNTER — Encounter: Payer: Self-pay | Admitting: Family Medicine

## 2022-02-16 DIAGNOSIS — S32009K Unspecified fracture of unspecified lumbar vertebra, subsequent encounter for fracture with nonunion: Secondary | ICD-10-CM | POA: Diagnosis not present

## 2022-02-16 NOTE — Assessment & Plan Note (Signed)
Chronic problem the patient did respond well to the epidural.  Discussed with patient icing regimen and home exercises.  We discussed again in great length about the possibility of Cymbalta.  Patient would like to hold with him making progress.  Patient is increased his time on the elliptical from 1 minute to 12 minutes but continues to have difficulty with walking greater than 100 to 200 feet.  Patient would like to be able to be more active but is finding increasingly difficult.  Patient will talk to his neurosurgeon about another injection before he has a trip.  Patient wants to avoid surgical intervention and other ideally have his still the possibility of Cymbalta.  Patient will hold on the medication with him making any improvement and follow-up with me again in 2 months.  Total time with patient greater than 31 minutes ?

## 2022-02-16 NOTE — Patient Instructions (Signed)
Be proud of accomplishments ?Keep working at it little by little ?Tell [place] about injection and consider another before trip ?Consider Cymbalta ?See me in 2 months ?

## 2022-03-09 DIAGNOSIS — Z6835 Body mass index (BMI) 35.0-35.9, adult: Secondary | ICD-10-CM | POA: Diagnosis not present

## 2022-03-09 DIAGNOSIS — I10 Essential (primary) hypertension: Secondary | ICD-10-CM | POA: Diagnosis not present

## 2022-03-09 DIAGNOSIS — M5416 Radiculopathy, lumbar region: Secondary | ICD-10-CM | POA: Diagnosis not present

## 2022-03-15 DIAGNOSIS — M545 Low back pain, unspecified: Secondary | ICD-10-CM | POA: Diagnosis not present

## 2022-03-15 DIAGNOSIS — M79605 Pain in left leg: Secondary | ICD-10-CM | POA: Diagnosis not present

## 2022-03-15 DIAGNOSIS — R200 Anesthesia of skin: Secondary | ICD-10-CM | POA: Diagnosis not present

## 2022-03-15 DIAGNOSIS — M5416 Radiculopathy, lumbar region: Secondary | ICD-10-CM | POA: Diagnosis not present

## 2022-03-15 DIAGNOSIS — R202 Paresthesia of skin: Secondary | ICD-10-CM | POA: Diagnosis not present

## 2022-03-18 ENCOUNTER — Encounter: Payer: Self-pay | Admitting: Internal Medicine

## 2022-03-18 MED ORDER — ROSUVASTATIN CALCIUM 10 MG PO TABS: 10.0000 mg | ORAL_TABLET | Freq: Every day | ORAL | 3 refills | Status: DC

## 2022-03-28 DIAGNOSIS — M48061 Spinal stenosis, lumbar region without neurogenic claudication: Secondary | ICD-10-CM | POA: Diagnosis not present

## 2022-03-28 DIAGNOSIS — R030 Elevated blood-pressure reading, without diagnosis of hypertension: Secondary | ICD-10-CM | POA: Diagnosis not present

## 2022-03-28 DIAGNOSIS — Z6835 Body mass index (BMI) 35.0-35.9, adult: Secondary | ICD-10-CM | POA: Diagnosis not present

## 2022-03-29 DIAGNOSIS — M5416 Radiculopathy, lumbar region: Secondary | ICD-10-CM | POA: Diagnosis not present

## 2022-03-29 DIAGNOSIS — M5116 Intervertebral disc disorders with radiculopathy, lumbar region: Secondary | ICD-10-CM | POA: Diagnosis not present

## 2022-04-14 DIAGNOSIS — L570 Actinic keratosis: Secondary | ICD-10-CM | POA: Diagnosis not present

## 2022-04-14 DIAGNOSIS — L814 Other melanin hyperpigmentation: Secondary | ICD-10-CM | POA: Diagnosis not present

## 2022-04-14 DIAGNOSIS — D225 Melanocytic nevi of trunk: Secondary | ICD-10-CM | POA: Diagnosis not present

## 2022-04-14 DIAGNOSIS — C44622 Squamous cell carcinoma of skin of right upper limb, including shoulder: Secondary | ICD-10-CM | POA: Diagnosis not present

## 2022-04-14 DIAGNOSIS — Z85820 Personal history of malignant melanoma of skin: Secondary | ICD-10-CM | POA: Diagnosis not present

## 2022-04-14 DIAGNOSIS — D492 Neoplasm of unspecified behavior of bone, soft tissue, and skin: Secondary | ICD-10-CM | POA: Diagnosis not present

## 2022-04-14 DIAGNOSIS — L821 Other seborrheic keratosis: Secondary | ICD-10-CM | POA: Diagnosis not present

## 2022-04-14 DIAGNOSIS — Z08 Encounter for follow-up examination after completed treatment for malignant neoplasm: Secondary | ICD-10-CM | POA: Diagnosis not present

## 2022-04-14 DIAGNOSIS — R208 Other disturbances of skin sensation: Secondary | ICD-10-CM | POA: Diagnosis not present

## 2022-04-21 DIAGNOSIS — I700 Atherosclerosis of aorta: Secondary | ICD-10-CM | POA: Diagnosis not present

## 2022-04-21 DIAGNOSIS — D039 Melanoma in situ, unspecified: Secondary | ICD-10-CM | POA: Diagnosis not present

## 2022-04-21 DIAGNOSIS — I10 Essential (primary) hypertension: Secondary | ICD-10-CM | POA: Diagnosis not present

## 2022-04-21 DIAGNOSIS — Z0000 Encounter for general adult medical examination without abnormal findings: Secondary | ICD-10-CM | POA: Diagnosis not present

## 2022-04-21 DIAGNOSIS — M5136 Other intervertebral disc degeneration, lumbar region: Secondary | ICD-10-CM | POA: Diagnosis not present

## 2022-04-21 DIAGNOSIS — N400 Enlarged prostate without lower urinary tract symptoms: Secondary | ICD-10-CM | POA: Diagnosis not present

## 2022-04-21 DIAGNOSIS — I2510 Atherosclerotic heart disease of native coronary artery without angina pectoris: Secondary | ICD-10-CM | POA: Diagnosis not present

## 2022-04-21 DIAGNOSIS — E782 Mixed hyperlipidemia: Secondary | ICD-10-CM | POA: Diagnosis not present

## 2022-04-21 DIAGNOSIS — Z1331 Encounter for screening for depression: Secondary | ICD-10-CM | POA: Diagnosis not present

## 2022-04-21 DIAGNOSIS — R7303 Prediabetes: Secondary | ICD-10-CM | POA: Diagnosis not present

## 2022-04-21 DIAGNOSIS — E538 Deficiency of other specified B group vitamins: Secondary | ICD-10-CM | POA: Diagnosis not present

## 2022-04-21 DIAGNOSIS — I4892 Unspecified atrial flutter: Secondary | ICD-10-CM | POA: Diagnosis not present

## 2022-04-25 NOTE — Progress Notes (Signed)
?[name] D.O. ?[facility] ?[place] ?Phone: [phone] ?Subjective:   ?I, [name], am serving as a Education administrator for Dr. Hulan Saas. ?This visit occurred during the SARS-CoV-2 public health emergency.  Safety protocols were in place, including screening questions prior to the visit, additional usage of staff PPE, and extensive cleaning of exam room while observing appropriate contact time as indicated for disinfecting solutions.  ? ?I'm seeing this patient by the request  of:  Tony Low, MD ? ?CC: Back pain, leg weakness follow-up ? ?[id number]  ?02/16/2022 ?Chronic problem the patient did respond well to the epidural.  Discussed with patient icing regimen and home exercises.  We discussed again in great length about the possibility of Cymbalta.  Patient would like to hold with him making progress.  Patient is increased his time on the elliptical from 1 minute to 12 minutes but continues to have difficulty with walking greater than 100 to 200 feet.  Patient would like to be able to be more active but is finding increasingly difficult.  Patient will talk to his neurosurgeon about another injection before he has a trip.  Patient wants to avoid surgical intervention and other ideally have his still the possibility of Cymbalta.  Patient will hold on the medication with him making any improvement and follow-up with me again in 2 months.  Total time with patient greater than 31 minutes ? ?Update 04/26/2022 ?[name] is a 76 y.o. male coming in with complaint of lumbar spine pain. Patient states pain hasn't changed much. Probably going to have another surgery. Epidural has been helping. Last about a month. ? ? ?  ? ?Past Medical History:  ?Diagnosis Date  ? Arthritis   ? Complication of anesthesia   ? FUSion of SKULL thru C7- patient has no movement of head or neck - stiffness from fusion  ? Diplopia 07/10/2013  ? Diverticulosis of colon (without mention of  hemorrhage) 2009  ? Colonoscopy  ? Dyslipidemia   ? Dysrhythmia   ? PAF- not frequently  ? HTN (hypertension)   ? Hx of colonic polyps 2009  ? Colonoscopy(Hyperplastic)  ? Obesity   ? 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 Paroxysmal atrial flutter ([place])   ? ?Past Surgical History:  ?Procedure Laterality Date  ? ABDOMINAL EXPOSURE N/A 01/06/2020  ? Procedure: ABDOMINAL EXPOSURE;  Surgeon: Rosetta Posner, MD;  Location: [facility];  Service: Vascular;  Laterality: N/A;  ? ANTERIOR LUMBAR FUSION N/A 01/06/2020  ? Procedure: Lumbar Five- Sacral One  Anterior lumbar interbody fusion with infuse;  Surgeon: Kristeen Miss, MD;  Location: [facility];  Service: Neurosurgery;  Laterality: N/A;  Lumbar Five- Sacral One  Anterior lumbar interbody fusion with infuse  ? [facility]  ? 2 surgery- skull to C7  ? COLON RESECTION  2007  ? COLONOSCOPY W/ POLYPECTOMY    ? CORONARY ATHERECTOMY N/A 07/01/2021  ? Procedure: CORONARY ATHERECTOMY;  Surgeon: Burnell Blanks, MD;  Location: [facility];  Service: Cardiovascular;  Laterality: N/A;  ? CORONARY STENT INTERVENTION N/A 07/01/2021  ? Procedure: CORONARY STENT INTERVENTION;  Surgeon: Burnell Blanks, MD;  Location: [facility];  Service: Cardiovascular;  Laterality: N/A;  ? HEMORRHOID SURGERY N/A 01/13/2016  ? Procedure: HEMORRHOIDECTOMY AND REMOVAL OF ANAL SKIN TAG;  Surgeon: Coralie Keens, MD;  Location: [facility];  Service: General;  Laterality: N/A;  ? INTRAVASCULAR PRESSURE WIRE/FFR STUDY N/A 06/18/2021  ?  Procedure: INTRAVASCULAR PRESSURE WIRE/FFR STUDY;  Surgeon: Burnell Blanks, MD;  Location: [facility];  Service: Cardiovascular;  Laterality: N/A;  ? INTRAVASCULAR ULTRASOUND/IVUS N/A 07/01/2021  ? Procedure: Intravascular Ultrasound/IVUS;  Surgeon: Burnell Blanks, MD;  Location: [facility];  Service: Cardiovascular;  Laterality: N/A;  ? REPLACEMENT TOTAL KNEE Left 2005  ? left  ? RIGHT/LEFT HEART CATH AND  CORONARY ANGIOGRAPHY N/A 06/18/2021  ? Procedure: RIGHT/LEFT HEART CATH AND CORONARY ANGIOGRAPHY;  Surgeon: Burnell Blanks, MD;  Location: [facility];  Service: Cardiovascular;  Laterality: N/A;  ? SYNOVIAL CYST EXCISION    ? lumbar spine  ? TONSILLECTOMY    ? TOTAL KNEE ARTHROPLASTY Right 01/06/2017  ? Procedure: RIGHT TOTAL KNEE ARTHROPLASTY;  Surgeon: Sydnee Cabal, MD;  Location: [facility];  Service: Orthopedics;  Laterality: Right;  Adductior Block  ? ?Social History  ? ?Socioeconomic History  ? Marital status: Married  ?  Spouse name: [name]  ? Number of children: 4  ? Years of education: 60  ? Highest education level: Not on file  ?Occupational History  ? Occupation: Retired  ?  Employer: RETIRED  ?Tobacco Use  ? Smoking status: Never  ? Smokeless tobacco: Never  ?Vaping Use  ? Vaping Use: Never used  ?Substance and Sexual Activity  ? Alcohol use: No  ? Drug use: No  ? Sexual activity: Not on file  ?Other Topics Concern  ? Not on file  ?Social History Narrative  ? Patient lives at home with Wife [name].   ? Patient has 4 children.   ? Patient is currently not working. He is retired and disabled.   ? Patient has a high school education.   ? ?Social Determinants of Health  ? ?Financial Resource Strain: Not on file  ?Food Insecurity: Not on file  ?Transportation Needs: Not on file  ?Physical Activity: Not on file  ?Stress: Not on file  ?Social Connections: Not on file  ? ?Allergies  ?Allergen Reactions  ? Alfuzosin Other (See Comments)  ?  Felt winded/faint  ? Gabapentin Nausea Only  ? Pregabalin Nausea Only and Other (See Comments)  ?  Felt poorly  ? Tamsulosin Hcl Other (See Comments)  ?  Orthostatic hypotension  ? Contrast Media [Iodinated Contrast Media] Rash  ?  Cath dye 06/18/21 rash started next day  ? Niacin Diarrhea  ? ?Family History  ?Problem Relation Age of Onset  ? Coronary artery disease Mother   ? Coronary artery disease Father   ? Colon cancer Neg Hx   ? Stomach cancer Neg Hx   ? ? ? ?Current  Outpatient Medications (Cardiovascular):  ?  amLODipine (NORVASC) 5 MG tablet, TAKE 1 TABLET BY MOUTH TWICE A DAY ?  benazepril (LOTENSIN) 20 MG tablet, Take 1 tablet (20 mg total) by mouth 2 (two) times daily. ?  hydrochlorothiazide (,MICROZIDE/HYDRODIURIL,) 12.5 MG capsule, Take 12.5 mg by mouth daily with lunch.  ?  metoprolol tartrate (LOPRESSOR) 25 MG tablet, Take 25 mg by mouth daily as needed (afib.).  ?  nitroGLYCERIN (NITROSTAT) 0.4 MG SL tablet, Place 1 tablet (0.4 mg total) under the tongue every 5 (five) minutes as needed. ?  rosuvastatin (CRESTOR) 10 MG tablet, Take 1 tablet (10 mg total) by mouth daily. ? ? ?Current Outpatient Medications (Analgesics):  ?  aspirin EC 81 MG tablet, Take 81 mg by mouth every evening. Swallow whole. ?  oxyCODONE (ROXICODONE) 5 MG immediate release tablet, Take 1-2 tablets (5-10 mg  total) by mouth every 4 (four) hours as needed for severe pain or breakthrough pain. ? ?Current Outpatient Medications (Hematological):  ?  clopidogrel (PLAVIX) 75 MG tablet, Take 1 tablet (75 mg total) by mouth daily. ? ?Current Outpatient Medications (Other):  ?  DULoxetine (CYMBALTA) 20 MG capsule, Take 1 capsule (20 mg total) by mouth daily. ?  B Complex-C (B-COMPLEX WITH VITAMIN C) tablet, Take 1 tablet by mouth daily with lunch. ?  Cholecalciferol (VITAMIN D3) 50 MCG (2000 UT) TABS, Take 2,000 Units by mouth daily with lunch. B complex, vitamin c 500 ?  Glucosamine-Chondroitin (COSAMIN DS PO), Take 1 tablet by mouth every evening. ?  methocarbamol (ROBAXIN) 500 MG tablet, Take 500 mg by mouth every 8 (eight) hours as needed for muscle spasms. ?  Multiple Vitamin (MULTIVITAMIN WITH MINERALS) TABS tablet, Take 1 tablet by mouth daily with lunch. Adult 50+ ?  scopolamine (TRANSDERM-SCOP) 1 MG/3DAYS, Place 1 patch onto the skin every three (3) days as needed (nausea). ?  vitamin C (ASCORBIC ACID) 500 MG tablet, Take 500 mg by mouth daily with lunch. ?  Zinc 50 MG TABS, Take 50 mg by mouth  every evening. ? ? ?Reviewed prior external information including notes and imaging from  ?primary care provider ?As well as notes that were available from care everywhere and other healthcare systems. ? ?Past Runner, broadcasting/film/video

## 2022-04-26 ENCOUNTER — Ambulatory Visit (INDEPENDENT_AMBULATORY_CARE_PROVIDER_SITE_OTHER): Payer: Medicare Other | Admitting: Family Medicine

## 2022-04-26 DIAGNOSIS — S32009K Unspecified fracture of unspecified lumbar vertebra, subsequent encounter for fracture with nonunion: Secondary | ICD-10-CM

## 2022-04-26 MED ORDER — DULOXETINE HCL 20 MG PO CPEP: 20.0000 mg | ORAL_CAPSULE | Freq: Every day | ORAL | 3 refills | Status: DC

## 2022-04-26 NOTE — Assessment & Plan Note (Signed)
Patient does have significant arthritic changes from the cervical spine all the way down to the lumbar spine.  Has near full lumbar fusion and cervical fusion already and may be having a thoracic fusion in the near future.  Discussed with patient about different alternatives over the course of 34 minutes.  Discussed different medications and patient is elected to try the possibility of Cymbalta.  Warned of potential side effects.  Has had difficulty with nausea with gabapentin but discussed that this could be potentially different.  We will see how patient responds.  Patient will be following up with his neurosurgeon at the end of June and discussing if surgery is necessary.  If not we can consider the possibility of formal physical therapy again.  Patient will follow-up with me again after discussing with the neurosurgeon. ?

## 2022-04-26 NOTE — Patient Instructions (Signed)
Cymbalta '20mg'$  daily ?Watch for side effects and write [place] if necessary ?Keep going to gym consider increasing intensity and decreasing time on elliptical ?Increasing weight and decreasing reps on machine ?Be proud of what you accomplish ?Send update after appointment in June ?

## 2022-05-02 ENCOUNTER — Telehealth: Payer: Self-pay | Admitting: Family Medicine

## 2022-05-02 ENCOUNTER — Encounter: Payer: Self-pay | Admitting: Family Medicine

## 2022-05-02 NOTE — Telephone Encounter (Signed)
Patient called asking to speak to Dr Thompson Caul assistant. He said that it was a long message and wanted to speak to you directly. ?   ?

## 2022-05-03 NOTE — Telephone Encounter (Signed)
Patient scheduled. Will not use Cymbalta at this time until speaking with Dr. Tamala [name].  ?

## 2022-05-03 NOTE — Telephone Encounter (Signed)
Will have to consider other medications. With patient being on other medications we need to be careful, need to take some time discussing.  ?Please tell him to not try the cymbalta at this time again  ?

## 2022-05-06 DIAGNOSIS — W57XXXA Bitten or stung by nonvenomous insect and other nonvenomous arthropods, initial encounter: Secondary | ICD-10-CM | POA: Diagnosis not present

## 2022-05-06 DIAGNOSIS — S20361A Insect bite (nonvenomous) of right front wall of thorax, initial encounter: Secondary | ICD-10-CM | POA: Diagnosis not present

## 2022-05-06 DIAGNOSIS — S70361A Insect bite (nonvenomous), right thigh, initial encounter: Secondary | ICD-10-CM | POA: Diagnosis not present

## 2022-05-06 DIAGNOSIS — S2096XA Insect bite (nonvenomous) of unspecified parts of thorax, initial encounter: Secondary | ICD-10-CM | POA: Diagnosis not present

## 2022-05-23 ENCOUNTER — Other Ambulatory Visit (HOSPITAL_BASED_OUTPATIENT_CLINIC_OR_DEPARTMENT_OTHER): Payer: Self-pay

## 2022-05-24 ENCOUNTER — Other Ambulatory Visit (HOSPITAL_BASED_OUTPATIENT_CLINIC_OR_DEPARTMENT_OTHER): Payer: Self-pay

## 2022-05-24 MED ORDER — OXYCODONE HCL 5 MG PO TABS
ORAL_TABLET | ORAL | 0 refills | Status: DC
  Filled 2022-05-24: qty 120, 30d supply, fill #0

## 2022-05-25 ENCOUNTER — Other Ambulatory Visit (HOSPITAL_BASED_OUTPATIENT_CLINIC_OR_DEPARTMENT_OTHER): Payer: Self-pay

## 2022-05-25 DIAGNOSIS — Z6835 Body mass index (BMI) 35.0-35.9, adult: Secondary | ICD-10-CM | POA: Diagnosis not present

## 2022-05-25 DIAGNOSIS — M5416 Radiculopathy, lumbar region: Secondary | ICD-10-CM | POA: Diagnosis not present

## 2022-06-01 NOTE — Progress Notes (Signed)
[name] [place] [address] [place] Phone: [phone] Subjective:   IVilma Meckel, am serving as a scribe for Dr. Hulan Saas.  I'm seeing this patient by the request  of:  [name], MD  CC: back pain follow up   XTG:[id number]  04/26/2022 Patient does have significant arthritic changes from the cervical spine all the way down to the lumbar spine.  Has near full lumbar fusion and cervical fusion already and may be having a thoracic fusion in the near future.  Discussed with patient about different alternatives over the course of 34 minutes.  Discussed different medications and patient is elected to try the possibility of Cymbalta.  Warned of potential side effects.  Has had difficulty with nausea with gabapentin but discussed that this could be potentially different.  We will see how patient responds.  Patient will be following up with his neurosurgeon at the end of June and discussing if surgery is necessary.  If not we can consider the possibility of formal physical therapy again.  Patient will follow-up with me again after discussing with the neurosurgeon.  Update 06/02/2022 [name] is a 76 y.o. male coming in with complaint of low back pain. Patient on Cymbalta '20mg'$  daily. Patient states same per usual. Just an update. Not taking Cymbalta.  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   Paroxysmal atrial flutter ([place])    Past Surgical History:  Procedure Laterality Date   ABDOMINAL EXPOSURE N/A  01/06/2020   Procedure: ABDOMINAL EXPOSURE;  Surgeon: Rosetta Posner, MD;  Location: [facility];  Service: Vascular;  Laterality: N/A;   ANTERIOR LUMBAR FUSION N/A 01/06/2020   Procedure: Lumbar Five- Sacral One  Anterior lumbar interbody fusion with infuse;  Surgeon: Kristeen Miss, MD;  Location: [facility];  Service: Neurosurgery;  Laterality: N/A;  Lumbar Five- Sacral One  Anterior lumbar interbody fusion with infuse   CERVICAL FUSION  1996, 1997   2 surgery- skull to C7   COLON RESECTION  2007   COLONOSCOPY W/ POLYPECTOMY     CORONARY ATHERECTOMY N/A 07/01/2021   Procedure: CORONARY ATHERECTOMY;  Surgeon: Burnell Blanks, MD;  Location: [facility];  Service: Cardiovascular;  Laterality: N/A;   CORONARY STENT INTERVENTION N/A 07/01/2021   Procedure: CORONARY STENT INTERVENTION;  Surgeon: Burnell Blanks, MD;  Location: [facility];  Service: Cardiovascular;  Laterality: N/A;   HEMORRHOID SURGERY N/A 01/13/2016   Procedure: HEMORRHOIDECTOMY AND REMOVAL OF ANAL SKIN TAG;  Surgeon: Coralie Keens, MD;  Location: [facility];  Service: General;  Laterality: N/A;   INTRAVASCULAR PRESSURE WIRE/FFR STUDY N/A 06/18/2021   Procedure: INTRAVASCULAR PRESSURE WIRE/FFR STUDY;  Surgeon: Burnell Blanks, MD;  Location: [facility];  Service: Cardiovascular;  Laterality: N/A;   INTRAVASCULAR ULTRASOUND/IVUS N/A 07/01/2021   Procedure: Intravascular Ultrasound/IVUS;  Surgeon: Burnell Blanks, MD;  Location: [facility];  Service: Cardiovascular;  Laterality: N/A;  REPLACEMENT TOTAL KNEE Left 2005   left   RIGHT/LEFT HEART CATH AND CORONARY ANGIOGRAPHY N/A 06/18/2021   Procedure: RIGHT/LEFT HEART CATH AND CORONARY ANGIOGRAPHY;  Surgeon: Burnell Blanks, MD;  Location: [facility];  Service: Cardiovascular;  Laterality: N/A;   SYNOVIAL CYST EXCISION     lumbar spine   TONSILLECTOMY     TOTAL KNEE ARTHROPLASTY Right 01/06/2017   Procedure: RIGHT TOTAL KNEE  ARTHROPLASTY;  Surgeon: Sydnee Cabal, MD;  Location: [facility];  Service: Orthopedics;  Laterality: Right;  Adductior Block   Social History   Socioeconomic History   Marital status: Married    Spouse name: Zigmund Daniel   Number of children: 4   Years of education: 12   Highest education level: Not on file  Occupational History   Occupation: Retired    Fish farm manager: RETIRED  Tobacco Use   Smoking status: Never   Smokeless tobacco: Never  Vaping Use   Vaping Use: Never used  Substance and Sexual Activity   Alcohol use: No   Drug use: No   Sexual activity: Not on file  Other Topics Concern   Not on file  Social History Narrative   Patient lives at home with Wife Zigmund Daniel.    Patient has 4 children.    Patient is currently not working. He is retired and disabled.    Patient has a high school education.    Social Determinants of Health   Financial Resource Strain: Not on file  Food Insecurity: Not on file  Transportation Needs: Not on file  Physical Activity: Not on file  Stress: Not on file  Social Connections: Not on file   Allergies  Allergen Reactions   Alfuzosin Other (See Comments)    Felt winded/faint   Gabapentin Nausea Only   Pregabalin Nausea Only and Other (See Comments)    Felt poorly   Tamsulosin Hcl Other (See Comments)    Orthostatic hypotension   Contrast Media [Iodinated Contrast Media] Rash    Cath dye 06/18/21 rash started next day   Niacin Diarrhea   Family History  Problem Relation Age of Onset   Coronary artery disease Mother    Coronary artery disease Father    Colon cancer Neg Hx    Stomach cancer Neg Hx      Current Outpatient Medications (Cardiovascular):    amLODipine (NORVASC) 5 MG tablet, TAKE 1 TABLET BY MOUTH TWICE A DAY   benazepril (LOTENSIN) 20 MG tablet, Take 1 tablet (20 mg total) by mouth 2 (two) times daily.   hydrochlorothiazide (,MICROZIDE/HYDRODIURIL,) 12.5 MG capsule, Take 12.5 mg by mouth daily with lunch.    metoprolol tartrate  (LOPRESSOR) 25 MG tablet, Take 25 mg by mouth daily as needed (afib.).    nitroGLYCERIN (NITROSTAT) 0.4 MG SL tablet, Place 1 tablet (0.4 mg total) under the tongue every 5 (five) minutes as needed.   rosuvastatin (CRESTOR) 10 MG tablet, Take 1 tablet (10 mg total) by mouth daily.   Current Outpatient Medications (Analgesics):    aspirin EC 81 MG tablet, Take 81 mg by mouth every evening. Swallow whole.   oxyCODONE (OXY IR/ROXICODONE) 5 MG immediate release tablet, take 1 tablet by oral route  every 6 hours as needed   oxyCODONE (ROXICODONE) 5 MG immediate release tablet, Take 1-2 tablets (5-10 mg total) by mouth every 4 (four) hours as needed for severe pain or breakthrough pain.  Current Outpatient Medications (Hematological):    clopidogrel (PLAVIX) 75 MG tablet, Take 1 tablet (75  mg total) by mouth daily.  Current Outpatient Medications (Other):    B Complex-C (B-COMPLEX WITH VITAMIN C) tablet, Take 1 tablet by mouth daily with lunch.   Cholecalciferol (VITAMIN D3) 50 MCG (2000 UT) TABS, Take 2,000 Units by mouth daily with lunch. B complex, vitamin c 500   DULoxetine (CYMBALTA) 20 MG capsule, Take 1 capsule (20 mg total) by mouth daily.   Glucosamine-Chondroitin (COSAMIN DS PO), Take 1 tablet by mouth every evening.   methocarbamol (ROBAXIN) 500 MG tablet, Take 500 mg by mouth every 8 (eight) hours as needed for muscle spasms.   Multiple Vitamin (MULTIVITAMIN WITH MINERALS) TABS tablet, Take 1 tablet by mouth daily with lunch. Adult 50+   scopolamine (TRANSDERM-SCOP) 1 MG/3DAYS, Place 1 patch onto the skin every three (3) days as needed (nausea).   vitamin C (ASCORBIC ACID) 500 MG tablet, Take 500 mg by mouth daily with lunch.   Zinc 50 MG TABS, Take 50 mg by mouth every evening.   Reviewed prior external information including notes and imaging from  primary care provider As well as notes that were available from care everywhere and other healthcare systems.  Past medical history,  social, surgical and family history all reviewed in electronic medical record.  No pertanent information unless stated regarding to the chief complaint.   Review of Systems:  No headache, visual changes, nausea, vomiting, diarrhea, constipation, dizziness, abdominal pain, skin rash, fevers, chills, night sweats, weight loss, swollen lymph nodes, body aches, joint swelling, chest pain, shortness of breath, mood changes. POSITIVE muscle aches  Objective  Blood pressure 126/68, pulse 69, height '5\' 11"'$  (1.803 m), weight 255 lb (115.7 kg), SpO2 96 %.   General: No apparent distress alert and oriented x3 mood and affect normal, dressed appropriately.  HEENT: Pupils equal, extraocular movements intact  Respiratory: Patient's speak in full sentences and does not appear short of breath  Cardiovascular: No lower extremity edema, non tender, no erythema  Low back still significant loss of lordosis.  Has less than 5 degrees of extension of the back noted.  Tender to palpation diffusely.  Patient has improvement in strength in the lower extremities.  Patient is able to get out of a seated chair much easier and improvement in hip abductor strengthening.    Impression and Recommendations:

## 2022-06-02 ENCOUNTER — Ambulatory Visit (INDEPENDENT_AMBULATORY_CARE_PROVIDER_SITE_OTHER): Payer: Medicare Other | Admitting: Family Medicine

## 2022-06-02 DIAGNOSIS — S32009K Unspecified fracture of unspecified lumbar vertebra, subsequent encounter for fracture with nonunion: Secondary | ICD-10-CM

## 2022-06-02 NOTE — Assessment & Plan Note (Signed)
Significant arthritic changes noted but patient has had significant surgical and neck surgical interventions over the course of time.  We discussed with patient about icing regimen and home exercises.  We discussed with patient to continue with the physical therapy if he thinks it is beneficial.  We discussed with patient that injections every 3 to 4 months with his neurosurgeon is also I think very reasonable.  Follow-up with me again 2 to 3 months.  Total time discussing with patient 31 minutes

## 2022-06-02 NOTE — Patient Instructions (Signed)
Good to see you! Proud of the accomplishment you've made Keep going to gym Dr. Harrington Challenger will be happy for you Heart rate should decrease 12 beats per min after 1st min of rest See you again in 3 months

## 2022-06-14 ENCOUNTER — Ambulatory Visit (INDEPENDENT_AMBULATORY_CARE_PROVIDER_SITE_OTHER): Payer: Medicare Other | Admitting: Internal Medicine

## 2022-06-14 ENCOUNTER — Encounter: Payer: Self-pay | Admitting: Internal Medicine

## 2022-06-14 VITALS — BP 124/72 | HR 60 | Ht 71.0 in | Wt 253.2 lb

## 2022-06-14 DIAGNOSIS — I25118 Atherosclerotic heart disease of native coronary artery with other forms of angina pectoris: Secondary | ICD-10-CM | POA: Diagnosis not present

## 2022-06-14 DIAGNOSIS — I4892 Unspecified atrial flutter: Secondary | ICD-10-CM

## 2022-06-16 ENCOUNTER — Other Ambulatory Visit: Payer: Self-pay | Admitting: *Deleted

## 2022-06-16 MED ORDER — CLOPIDOGREL BISULFATE 75 MG PO TABS: 75.0000 mg | ORAL_TABLET | Freq: Every day | ORAL | 3 refills | Status: DC

## 2022-06-27 ENCOUNTER — Other Ambulatory Visit (HOSPITAL_BASED_OUTPATIENT_CLINIC_OR_DEPARTMENT_OTHER): Payer: Self-pay

## 2022-06-27 MED ORDER — OXYCODONE HCL 5 MG PO TABS
ORAL_TABLET | ORAL | 0 refills | Status: DC
  Filled 2022-06-27: qty 120, 30d supply, fill #0

## 2022-07-13 ENCOUNTER — Other Ambulatory Visit (HOSPITAL_BASED_OUTPATIENT_CLINIC_OR_DEPARTMENT_OTHER): Payer: Self-pay

## 2022-07-13 MED ORDER — OXYCODONE-ACETAMINOPHEN 10-325 MG PO TABS
ORAL_TABLET | ORAL | 0 refills | Status: DC
  Filled 2022-08-02: qty 120, 30d supply, fill #0

## 2022-07-21 DIAGNOSIS — M5416 Radiculopathy, lumbar region: Secondary | ICD-10-CM | POA: Diagnosis not present

## 2022-07-21 DIAGNOSIS — M5116 Intervertebral disc disorders with radiculopathy, lumbar region: Secondary | ICD-10-CM | POA: Diagnosis not present

## 2022-08-01 DIAGNOSIS — N419 Inflammatory disease of prostate, unspecified: Secondary | ICD-10-CM | POA: Diagnosis not present

## 2022-08-02 ENCOUNTER — Other Ambulatory Visit (HOSPITAL_BASED_OUTPATIENT_CLINIC_OR_DEPARTMENT_OTHER): Payer: Self-pay

## 2022-08-11 ENCOUNTER — Telehealth: Payer: Self-pay | Admitting: Internal Medicine

## 2022-08-11 NOTE — Telephone Encounter (Signed)
Will address at clinic visit.   Stana Bayon S Shakeya Kerkman, NP  

## 2022-08-11 NOTE — Telephone Encounter (Signed)
.  Pt c/o Shortness Of Breath: STAT if SOB developed within the last 24 hours or pt is noticeably SOB on the phone  1. Are you currently SOB (can you hear that pt is SOB on the phone)? Patient is not short of breath at this time  2. How long have you been experiencing SOB?  About a week and a half  3. Are you SOB when sitting or when up moving around? When he is moving around  4. Are you currently experiencing any other symptoms? Dizziness and a little nausea- pt wanted to be seen- made an appointment for tomorrow with [facility]

## 2022-08-11 NOTE — Telephone Encounter (Signed)
The patient is calling about shortness of breath, nausea and dizziness he is not short of breath during the phone call. Of note the patient mentions that he was ordered a 10 day course on Doxy for a prostate infection. He took 7 days and stopped it on Monday 8/21 because he worried that the Doxy was causing the problem. He is not getting better so now is worried that it could be his heart. He has not messed any doses of medication, has no swelling, and no weight gain. Vital signs have been stable BP ~130/60's HR ~60's. He has no chest pain and is still taking his Plavix, post stents July 2022. He has an exercise program 3 times a week at the [place] office which he has been able to continue. Yesterday 8/23 he used the Elliptical for 15 minutes and he was okay, however if he walks to the car, mailbox, from room to room in his house he becomes SOB every time. Noted he will see an APP tomorrow 8/25. Gave ED precautions.  Patient verbalized understanding and agreement.

## 2022-08-12 ENCOUNTER — Encounter (HOSPITAL_BASED_OUTPATIENT_CLINIC_OR_DEPARTMENT_OTHER): Payer: Self-pay | Admitting: Family

## 2022-08-12 ENCOUNTER — Ambulatory Visit (INDEPENDENT_AMBULATORY_CARE_PROVIDER_SITE_OTHER): Payer: Medicare Other | Admitting: Family

## 2022-08-12 VITALS — BP 140/70 | HR 60 | Ht 71.0 in | Wt 247.0 lb

## 2022-08-12 DIAGNOSIS — I491 Atrial premature depolarization: Secondary | ICD-10-CM

## 2022-08-12 DIAGNOSIS — I25118 Atherosclerotic heart disease of native coronary artery with other forms of angina pectoris: Secondary | ICD-10-CM | POA: Diagnosis not present

## 2022-08-12 DIAGNOSIS — I10 Essential (primary) hypertension: Secondary | ICD-10-CM

## 2022-08-12 DIAGNOSIS — I4892 Unspecified atrial flutter: Secondary | ICD-10-CM

## 2022-08-12 DIAGNOSIS — R0609 Other forms of dyspnea: Secondary | ICD-10-CM

## 2022-08-12 DIAGNOSIS — E785 Hyperlipidemia, unspecified: Secondary | ICD-10-CM

## 2022-08-12 NOTE — Progress Notes (Signed)
Office Visit    Patient Name: [name] Date of Encounter: 08/12/2022  PCP:  [name], [facility]  Cardiologist:  [name], MD  Advanced Practice Provider:  No care team member to display Electrophysiologist:  None      Chief Complaint    [name] is a 76 y.o. 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   Paroxysmal atrial flutter ([place])    Past Surgical History:  Procedure Laterality Date   ABDOMINAL EXPOSURE N/A 01/06/2020   Procedure: ABDOMINAL EXPOSURE;  Surgeon: Rosetta Posner, MD;  Location: [facility];  Service: Vascular;  Laterality: N/A;   ANTERIOR LUMBAR FUSION N/A 01/06/2020   Procedure: Lumbar Five- Sacral One  Anterior lumbar interbody fusion with infuse;  Surgeon: Kristeen Miss, MD;  Location: [place];  Service: Neurosurgery;  Laterality: N/A;  Lumbar Five- Sacral One  Anterior lumbar interbody fusion with infuse   CERVICAL FUSION  1996, 1997   2 surgery- skull to C7   COLON RESECTION  2007   COLONOSCOPY W/ POLYPECTOMY     CORONARY ATHERECTOMY N/A 07/01/2021   Procedure: CORONARY ATHERECTOMY;  Surgeon: Burnell Blanks, MD;  Location: [place] [facility];  Service: Cardiovascular;  Laterality: N/A;   CORONARY STENT INTERVENTION N/A 07/01/2021   Procedure: CORONARY STENT INTERVENTION;  Surgeon: Burnell Blanks, MD;  Location: [place] [facility];  Service: Cardiovascular;  Laterality: N/A;   HEMORRHOID SURGERY N/A 01/13/2016    Procedure: HEMORRHOIDECTOMY AND REMOVAL OF ANAL SKIN TAG;  Surgeon: Coralie Keens, MD;  Location: [place];  Service: General;  Laterality: N/A;   INTRAVASCULAR PRESSURE WIRE/FFR STUDY N/A 06/18/2021   Procedure: INTRAVASCULAR PRESSURE WIRE/FFR STUDY;  Surgeon: Burnell Blanks, MD;  Location: [place] [facility];  Service: Cardiovascular;  Laterality: N/A;   INTRAVASCULAR ULTRASOUND/IVUS N/A 07/01/2021   Procedure: Intravascular Ultrasound/IVUS;  Surgeon: Burnell Blanks, MD;  Location: [place] [facility];  Service: Cardiovascular;  Laterality: N/A;   REPLACEMENT TOTAL KNEE Left 2005   left   RIGHT/LEFT HEART CATH AND CORONARY ANGIOGRAPHY N/A 06/18/2021   Procedure: RIGHT/LEFT HEART CATH AND CORONARY ANGIOGRAPHY;  Surgeon: Burnell Blanks, MD;  Location: [place] [facility];  Service: Cardiovascular;  Laterality: N/A;   SYNOVIAL CYST EXCISION     lumbar spine   TONSILLECTOMY     TOTAL KNEE ARTHROPLASTY Right 01/06/2017   Procedure: RIGHT TOTAL KNEE ARTHROPLASTY;  Surgeon: Sydnee Cabal, MD;  Location: [facility];  Service: Orthopedics;  Laterality: Right;  Adductior Block    Allergies  Allergies  Allergen Reactions   Alfuzosin Other (See Comments)    Felt winded/faint   Cymbalta [Duloxetine Hcl]     "Nausea, dizzy, sick"   Gabapentin Nausea Only   Pregabalin Nausea Only and Other (See Comments)    Felt poorly   Tamsulosin Hcl Other (See Comments)    Orthostatic hypotension  Contrast Media [Iodinated Contrast Media] Rash    Cath dye 06/18/21 rash started next day   Niacin Diarrhea    History of Present Illness    [name] is a 76 y.o. male with a hx of paroxysmal atrial flutter, hypertension, hyperlipidemia, PAC, CAD (atherectomy with DES to LAD) last seen 06/14/2022 Dr. Harrington Challenger.  Prior echo 2017 with normal LVEF.  In 2018 complained of shortness of breath with CT negative for PE.  Calcifications noted on coronary arteries.  Myoview was unremarkable.  He was seen  June 2022 with recurrent exertional dyspnea.  CT coronary angiogram with significant coronary artery calcification with subsequent LHC requiring atherectomy with DES-LAD.  He was last seen 06/14/2022 doing well from a cardiac perspective.  He was exercising at [facility].  He presents today after contacting the office noting shortness of breath, nausea, dizziness.  Tells me 2 weeks ago he had prostate infection and was started on Doxycycline which cleared it up. Around the time he started the Doxycycline felt headache, nausea which were overall mild. Also short of breath with activity such sa walking to the car. Not as significant as his anginal equivalent. Notes he often does not tolerate medications well. Has not taken Doxycycline since Monday. Symptoms with some improvement.   Exercising at [employer] 3 times per week. Using elliptical 25 minutes .Has been now working to reduce time but increase the resistance at direction of Dr. Creig Hines. Heart rate increasing appropriately with exercise. No exertional dyspnea with exercise - more notable when walking in the store or to his car. No chest pain, pressure, tightness.   EKGs/Labs/Other Studies Reviewed:   The following studies were reviewed today:   ATHRECTOMY  07/01/21     Prox LAD to Mid LAD lesion is 80% stenosed.   Mid LAD lesion is 80% stenosed.   A drug-eluting stent was successfully placed using a STENT ONYX FRONTIER 2.75X12, STENT ONYX FRONTIER 2.5 x 15, STENT ONYX FRONTIER 2.25 x 8.   Post intervention, there is a 0% residual stenosis.   Post intervention, there is a 0% residual stenosis.   Severe mid LAD stenosis Successful orbital atherectomy mid LAD with placement of 3 overlapping drug eluting stents.    Recommendations: Continue DAPT with ASA and Plavix for at least 12 months given long overlapping stent segment. Will discharge home today after 6 hours bedrest.    Cardiac cath 06/18/21   Ost RCA to Prox RCA lesion is 50% stenosed. Mid  RCA lesion is 40% stenosed. Dist RCA lesion is 30% stenosed. Prox LAD to Mid LAD lesion is 80% stenosed. Mid LAD lesion is 80% stenosed.   1. Severe, heavily calcified proximal and mid LAD stenosis. This is flow limiting by pressure wire analysis.  2. No obstructive disease in the Circumflex 3. Moderate non-obstructive disease in the ostial/proximal RCA, mid RCA 4. No significant elevation of right and left heart pressures   Recommendations: He has complex heavily calcified disease in the proximal and mid LAD. The more proximal lesion involves a large septal branch and the mid lesion involves a large diagonal branch. He will be brought back in several weeks for staged PCI of the LAD with orbital atherectomy and stenting. I suspect that it will require a long segment of stenting from the proximal through mid LAD. Will load with Plavix as an outpatient.  Chest CT   1. Stable subpleural density in the peripheral right middle lobe likely represents scarring. 2. Mildly prominent bilateral axillary lymph  nodes again noted of unclear significance and potentially representing reactive nodes. The largest right axillary lymph node shows slight decrease in size since 2018.   CT coronary angiogram   Coronary calcium score: The patient's coronary artery calcium score is 1948, which places the patient in the 88th percentile.   Coronary arteries: Normal coronary origins.  Right dominance.   Right Coronary Artery: Dominant. Heavily calcified. Diffuse mixed mild 25-49% stenoses (CADRADS2), with likely moderate (CADRADS3) stenosis in the mid-vessel.   Left Main Coronary Artery: Normal. Trifurcates into the LAD, small ramus intermedius branch and LCX arteries.   Left Anterior Descending Coronary Artery: Heavily calcified proximal LAD with at least moderate 50-69% (CADRADS3) to possibly severe proximal stenosis. Smaller mid-diagonal branch with minimal 1-24% mixed (CADRADS1) stenosis.   Ramus  Intermedius Coronary Artery: Smaller caliber, calcified proximally with probably minimal 1-24% stenosis (CADRADS1).   Left Circumflex Artery: The LCx is poorly visualized. There is at least mild mixed 25-49% proximal stenosis (CADRADS2).   Aorta: Normal size, 30 mm at the mid ascending aorta (level of the PA bifurcation) measured double oblique. Aortic atherosclerosis. No dissection.   Aortic Valve: Trileaflet.  Annular calcification.   Other findings:   Normal pulmonary vein drainage into the left atrium.   Normal left atrial appendage without a thrombus.   Normal size of the pulmonary artery.   IMPRESSION: 1. Heavy multivessel coronary calcification with moderate to possibly severe CAD of the LAD, CADRADS = 3. CT FFR will be performed and reported separately.   2. Coronary calcium score of 1948. This was 88th percentile for age and sex matched control.   3. Normal coronary origin with right dominance.   4. Aortic atherosclerosis.    EKG:  EKG is  ordered today.  The ekg ordered today demonstrates NSR 60 bpm with PAC in pattern of bigeminy.   Recent Labs: 10/04/2021: ALT 24; BUN 32; Creatinine, Ser 1.17; Potassium 5.1; Sodium 140  Recent Lipid Panel    Component Value Date/Time   CHOL 99 (L) 10/04/2021 1119   TRIG 106 10/04/2021 1119   HDL 30 (L) 10/04/2021 1119   CHOLHDL 3.3 10/04/2021 1119   CHOLHDL 4.4 09/05/2016 1029   VLDL 22 09/05/2016 1029   LDLCALC 49 10/04/2021 1119    Home Medications   Current Meds  Medication Sig   amLODipine (NORVASC) 5 MG tablet TAKE 1 TABLET BY MOUTH TWICE A DAY   aspirin EC 81 MG tablet Take 81 mg by mouth every evening. Swallow whole.   B Complex-C (B-COMPLEX WITH VITAMIN C) tablet Take 1 tablet by mouth daily with lunch.   benazepril (LOTENSIN) 20 MG tablet Take 1 tablet (20 mg total) by mouth 2 (two) times daily.   Cholecalciferol (VITAMIN D3) 50 MCG (2000 UT) TABS Take 2,000 Units by mouth daily with lunch. B complex,  vitamin c 500   clopidogrel (PLAVIX) 75 MG tablet Take 1 tablet (75 mg total) by mouth daily.   Glucosamine-Chondroitin (COSAMIN DS PO) Take 1 tablet by mouth every evening.   hydrochlorothiazide (,MICROZIDE/HYDRODIURIL,) 12.5 MG capsule Take 12.5 mg by mouth daily with lunch.    methocarbamol (ROBAXIN) 500 MG tablet Take 500 mg by mouth every 8 (eight) hours as needed for muscle spasms.   metoprolol tartrate (LOPRESSOR) 25 MG tablet Take 25 mg by mouth daily as needed (afib.).    Multiple Vitamin (MULTIVITAMIN WITH MINERALS) TABS tablet Take 1 tablet by mouth daily with lunch. Adult 50+   nitroGLYCERIN (NITROSTAT) 0.4 MG SL tablet  Place 1 tablet (0.4 mg total) under the tongue every 5 (five) minutes as needed.   oxyCODONE (ROXICODONE) 5 MG immediate release tablet Take 1-2 tablets (5-10 mg total) by mouth every 4 (four) hours as needed for severe pain or breakthrough pain.   oxyCODONE-acetaminophen (PERCOCET) 10-325 MG tablet take 1 tablet by oral route  every 6 hours as needed for pain (DNF 07/27/22)   rosuvastatin (CRESTOR) 10 MG tablet Take 1 tablet (10 mg total) by mouth daily.   scopolamine (TRANSDERM-SCOP) 1 MG/3DAYS Place 1 patch onto the skin every three (3) days as needed (nausea).   vitamin C (ASCORBIC ACID) 500 MG tablet Take 500 mg by mouth daily with lunch.   Zinc 50 MG TABS Take 50 mg by mouth every evening.    Review of Systems     All other systems reviewed and are otherwise negative except as noted above.  Physical Exam    VS:  BP (!) 140/70   Pulse 60   Ht '5\' 11"'$  (1.803 m)   Wt 247 lb (112 kg)   BMI 34.45 kg/m  , BMI Body mass index is 34.45 kg/m.  Wt Readings from Last 3 Encounters:  08/12/22 247 lb (112 kg)  06/14/22 253 lb 3.2 oz (114.9 kg)  06/02/22 255 lb (115.7 kg)    GEN: Well nourished, overweight, well developed, in no acute distress. HEENT: normal. Neck: Supple, no JVD, carotid bruits, or masses. Cardiac: RRR, no murmurs, rubs, or gallops. No clubbing,  cyanosis, edema.  Radials/PT 2+ and equal bilaterally.  Respiratory:  Respirations regular and unlabored, clear to auscultation bilaterally. GI: Soft, nontender, nondistended. MS: No deformity or atrophy. Skin: Warm and dry, no rash. Neuro:  Strength and sensation are intact. Psych: Normal affect.  Assessment & Plan    Dyspnea -2-week history of exertional dyspnea particularly with activity such as walking to the car walking in the store.  Interestingly no dyspnea with exercising indoors on the elliptical.  Consider possibility that this is related to the recent heat and humidity.  EKG today no acute ST/T wave changes.  We will update echocardiogram to rule out newly reduced LVEF, valvular abnormality, wall motion abnormalities.  Update CBC to rule out anemia or infection as contributory.    CAD s/p atherectomy and overlapping Onyx stents to LAD -   Persistent atrial flutter -remote history which was extremely symptomatic.  No documented recurrence.  PAC - EKG today with PAC in pattern of bigeminy. Overall asymptomatic. No recent palpitations. Continue Metoprolol PRN.   HTN  - BP mildly elevated in clinic today.  Reports readings more often in the 130s at home.  Recommend home BP monitoring.  If BP consistently greater than 130/80 at home we could consider increasing amlodipine or benazepril.  BMP today for monitoring of renal function benazepril.  Readdress at follow-up  Hyperlipidemia, LDL goal less than 70 - 04/2022 LDL 51. Continue Rosuvastatin. Denies myalgias.   Obesity -  Weight loss via diet and exercise encouraged. Discussed the impact being overweight would have on cardiovascular risk. Encouraged to continue working out at [employer].   HYPERTENSION CONTROL Vitals:   08/12/22 0848 08/12/22 0909  BP: (!) 146/74 (!) 140/70    The patient's blood pressure is elevated above target today.  In order to address the patient's elevated BP: Blood pressure will be monitored at home to  determine if medication changes need to be made.; Follow up with general cardiology has been recommended. (Bring log of BP to upcoming  appointment. Home BP often 130s/80s per his report.)         Disposition: Follow up in 2 month(s) with [name], MD or APP.  Signed, Loel Dubonnet, NP 08/12/2022, 9:41 AM Hines

## 2022-08-12 NOTE — Patient Instructions (Signed)
Medication Instructions:  Continue your current medications.   *If you need a refill on your cardiac medications before your next appointment, please call your pharmacy*   Lab Work: Your physician recommends that you return for lab work today: CBC, BMP  If you have labs (blood work) drawn today and your tests are completely normal, you will receive your results only by: [place] (if you have [place]) OR A paper copy in the mail If you have any lab test that is abnormal or we need to change your treatment, we will call you to review the results.   Testing/Procedures: Your physician has requested that you have an echocardiogram. Echocardiography is a painless test that uses sound waves to create images of your heart. It provides your doctor with information about the size and shape of your heart and how well your heart's chambers and valves are working. This procedure takes approximately one hour. There are no restrictions for this procedure.    Follow-Up: At [facility], you and your health needs are our priority.  As part of our continuing mission to provide you with exceptional heart care, we have created designated Provider Care Teams.  These Care Teams include your primary Cardiologist (physician) and Advanced Practice Providers (APPs -  Physician Assistants and Nurse Practitioners) who all work together to provide you with the care you need, when you need it.  We recommend signing up for the patient portal called "MyChart".  Sign up information is provided on this After Visit Summary.  MyChart is used to connect with patients for Virtual Visits (Telemedicine).  Patients are able to view lab/test results, encounter notes, upcoming appointments, etc.  Non-urgent messages can be sent to your provider as well.   To learn more about what you can do with MyChart, go to [email or web address].    Your next appointment:   2-3 month(s)  The format for your next appointment:   In  Person  Provider:   [name], MD or Loel Dubonnet, NP     Other Instructions  Heart Healthy Diet Recommendations: A low-salt diet is recommended. Meats should be grilled, baked, or boiled. Avoid fried foods. Focus on lean protein sources like fish or chicken with vegetables and fruits. The American Heart Association is a Microbiologist!  American Heart Association Diet and Lifeystyle Recommendations   Exercise recommendations: The American Heart Association recommends 150 minutes of moderate intensity exercise weekly. Try 30 minutes of moderate intensity exercise 4-5 times per week. This could include walking, jogging, or swimming.  Keep up the good work at [employer]!  Important Information About Sugar

## 2022-08-13 LAB — BASIC METABOLIC PANEL
BUN/Creatinine Ratio: 26 — ABNORMAL HIGH (ref 10–24)
BUN: 33 mg/dL — ABNORMAL HIGH (ref 8–27)
CO2: 18 mmol/L — ABNORMAL LOW (ref 20–29)
Calcium: 9.7 mg/dL (ref 8.6–10.2)
Chloride: 109 mmol/L — ABNORMAL HIGH (ref 96–106)
Creatinine, Ser: 1.26 mg/dL (ref 0.76–1.27)
Glucose: 113 mg/dL — ABNORMAL HIGH (ref 70–99)
Potassium: 5 mmol/L (ref 3.5–5.2)
Sodium: 139 mmol/L (ref 134–144)
eGFR: 59 mL/min/{1.73_m2} — ABNORMAL LOW (ref >59)

## 2022-08-13 LAB — CBC
Hematocrit: 38.9 % (ref 37.5–51.0)
Hemoglobin: 13 g/dL (ref 13.0–17.7)
MCH: 32.9 pg (ref 26.6–33.0)
MCHC: 33.4 g/dL (ref 31.5–35.7)
MCV: 99 fL — ABNORMAL HIGH (ref 79–97)
Platelets: 134 10*3/uL — ABNORMAL LOW (ref 150–450)
RBC: 3.95 x10E6/uL — ABNORMAL LOW (ref 4.14–5.80)
RDW: 13.2 % (ref 11.6–15.4)
WBC: 7.1 10*3/uL (ref 3.4–10.8)

## 2022-08-15 ENCOUNTER — Telehealth (HOSPITAL_BASED_OUTPATIENT_CLINIC_OR_DEPARTMENT_OTHER): Payer: Self-pay

## 2022-08-15 ENCOUNTER — Other Ambulatory Visit: Payer: Self-pay | Admitting: Physician Assistant

## 2022-08-15 DIAGNOSIS — I10 Essential (primary) hypertension: Secondary | ICD-10-CM

## 2022-08-15 NOTE — Telephone Encounter (Addendum)
Seen by patient [name] on 08/15/2022 11:52 AM; labs ordered and mailed to patient, follow up mychart message sent to patient.     ----- Message from Loel Dubonnet, NP sent at 08/15/2022  8:02 AM EDT ----- Overall stable kidney function. Ensure staying well hydrated. No significant anemia nor infection. Platelet count mildly low, recommend repeat in 1 month for monitoring.   BMP/CBC in 1 month please.

## 2022-08-16 ENCOUNTER — Telehealth: Payer: Self-pay

## 2022-08-16 MED ORDER — AMLODIPINE BESYLATE 5 MG PO TABS: 5.0000 mg | ORAL_TABLET | Freq: Two times a day (BID) | ORAL | 2 refills | Status: DC

## 2022-08-16 NOTE — Telephone Encounter (Signed)
Received a call from the patient requesting a refill on Norvasc. He states he requested the refill last week and has not heard anything back. He states he is at the pharmacy now and they informed him that they have not heard back from our office.   Chart reviewed.   Rx(s) sent to pharmacy electronically. Patient voiced understanding and thanked me for helping with his medication.

## 2022-08-22 ENCOUNTER — Encounter (HOSPITAL_BASED_OUTPATIENT_CLINIC_OR_DEPARTMENT_OTHER): Payer: Self-pay

## 2022-08-31 NOTE — Progress Notes (Signed)
[facility] De[facility] [place] [address] [place] Phone: [phone] Subjective:   IVilma Meckel, am serving as a scribe for Dr. Hulan Saas.  I'm seeing this patient by the request  of:  [name], MD  CC: Low back pain  FIE:[id number]  06/02/2022 Significant arthritic changes noted but patient has had significant surgical and neck surgical interventions over the course of time.  We discussed with patient about icing regimen and home exercises.  We discussed with patient to continue with the physical therapy if he thinks it is beneficial.  We discussed with patient that injections every 3 to 4 months with his neurosurgeon is also I think very reasonable.  Follow-up with me again 2 to 3 months.  Total time discussing with patient 31 minutes  Update 09/01/2022 JAYMARI CROMIE is a 76 y.o. male coming in with complaint of LBP. Patient states same as usual. Back injection give him enough relief for 4-6 weeks. Exercise helps with mental, but not with the walking aspect.  Patient continues to have pain on a daily basis.  Still if he walks greater than 15 minutes has to stop.  Patient notices though that he can do 25 to 30 minutes on an elliptical with significantly less pain.     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   Paroxysmal atrial flutter ([place])    Past Surgical History:  Procedure Laterality Date   ABDOMINAL EXPOSURE N/A 01/06/2020   Procedure: ABDOMINAL EXPOSURE;  Surgeon: Rosetta Posner, MD;  [facility];  Service: Vascular;   Laterality: N/A;   ANTERIOR LUMBAR FUSION N/A 01/06/2020   Procedure: Lumbar Five- Sacral One  Anterior lumbar interbody fusion with infuse;  Surgeon: Kristeen Miss, MD;  Location: [facility];  Service: Neurosurgery;  Laterality: N/A;  Lumbar Five- Sacral One  Anterior lumbar interbody fusion with infuse   CERVICAL FUSION  1996, 1997   2 surgery- skull to C7   COLON RESECTION  2007   COLONOSCOPY W/ POLYPECTOMY     CORONARY ATHERECTOMY N/A 07/01/2021   Procedure: CORONARY ATHERECTOMY;  Surgeon: Burnell Blanks, MD;  Location: [facility];  Service: Cardiovascular;  Laterality: N/A;   CORONARY STENT INTERVENTION N/A 07/01/2021   Procedure: CORONARY STENT INTERVENTION;  Surgeon: Burnell Blanks, MD;  Location: [facility];  Service: Cardiovascular;  Laterality: N/A;   HEMORRHOID SURGERY N/A 01/13/2016   Procedure: HEMORRHOIDECTOMY AND REMOVAL OF ANAL SKIN TAG;  Surgeon: Coralie Keens, MD;  Location: [facility];  Service: General;  Laterality: N/A;   INTRAVASCULAR PRESSURE WIRE/FFR STUDY N/A 06/18/2021   Procedure: INTRAVASCULAR PRESSURE WIRE/FFR STUDY;  Surgeon: Burnell Blanks, MD;  Location: [facility];  Service: Cardiovascular;  Laterality: N/A;   INTRAVASCULAR ULTRASOUND/IVUS N/A 07/01/2021   Procedure: Intravascular Ultrasound/IVUS;  Surgeon: Burnell Blanks, MD;  Location: [facility];  Service: Cardiovascular;  Laterality: N/A;   REPLACEMENT TOTAL KNEE Left 2005   left   RIGHT/LEFT  HEART CATH AND CORONARY ANGIOGRAPHY N/A 06/18/2021   Procedure: RIGHT/LEFT HEART CATH AND CORONARY ANGIOGRAPHY;  Surgeon: Burnell Blanks, MD;  Location: [facility];  Service: Cardiovascular;  Laterality: N/A;   SYNOVIAL CYST EXCISION     lumbar spine   TONSILLECTOMY     TOTAL KNEE ARTHROPLASTY Right 01/06/2017   Procedure: RIGHT TOTAL KNEE ARTHROPLASTY;  Surgeon: Sydnee Cabal, MD;  Location: [facility];  Service: Orthopedics;  Laterality: Right;   Adductior Block   Social History   Socioeconomic History   Marital status: Married    Spouse name: Zigmund Daniel   Number of children: 4   Years of education: 12   Highest education level: Not on file  Occupational History   Occupation: Retired    Fish farm manager: RETIRED  Tobacco Use   Smoking status: Never   Smokeless tobacco: Never  Vaping Use   Vaping Use: Never used  Substance and Sexual Activity   Alcohol use: No   Drug use: No   Sexual activity: Not on file  Other Topics Concern   Not on file  Social History Narrative   Patient lives at home with Wife Zigmund Daniel.    Patient has 4 children.    Patient is currently not working. He is retired and disabled.    Patient has a high school education.    Social Determinants of Health   Financial Resource Strain: Not on file  Food Insecurity: Not on file  Transportation Needs: Not on file  Physical Activity: Not on file  Stress: Not on file  Social Connections: Not on file   Allergies  Allergen Reactions   Alfuzosin Other (See Comments)    Felt winded/faint   Cymbalta [Duloxetine Hcl]     "Nausea, dizzy, sick"   Gabapentin Nausea Only   Pregabalin Nausea Only and Other (See Comments)    Felt poorly   Tamsulosin Hcl Other (See Comments)    Orthostatic hypotension   Contrast Media [Iodinated Contrast Media] Rash    Cath dye 06/18/21 rash started next day   Niacin Diarrhea   Family History  Problem Relation Age of Onset   Coronary artery disease Mother    Coronary artery disease Father    Colon cancer Neg Hx    Stomach cancer Neg Hx      Current Outpatient Medications (Cardiovascular):    amLODipine (NORVASC) 5 MG tablet, Take 1 tablet (5 mg total) by mouth 2 (two) times daily.   benazepril (LOTENSIN) 20 MG tablet, Take 1 tablet (20 mg total) by mouth 2 (two) times daily.   hydrochlorothiazide (,MICROZIDE/HYDRODIURIL,) 12.5 MG capsule, Take 12.5 mg by mouth daily with lunch.    metoprolol tartrate (LOPRESSOR) 25 MG tablet, Take 25  mg by mouth daily as needed (afib.).    nitroGLYCERIN (NITROSTAT) 0.4 MG SL tablet, Place 1 tablet (0.4 mg total) under the tongue every 5 (five) minutes as needed.   rosuvastatin (CRESTOR) 10 MG tablet, Take 1 tablet (10 mg total) by mouth daily.   Current Outpatient Medications (Analgesics):    aspirin EC 81 MG tablet, Take 81 mg by mouth every evening. Swallow whole.   oxyCODONE (ROXICODONE) 5 MG immediate release tablet, Take 1-2 tablets (5-10 mg total) by mouth every 4 (four) hours as needed for severe pain or breakthrough pain.   oxyCODONE-acetaminophen (PERCOCET) 10-325 MG tablet, take 1 tablet by oral route  every 6 hours as needed for pain (DNF 07/27/22)  Current Outpatient Medications (Hematological):    clopidogrel (PLAVIX) 75  MG tablet, Take 1 tablet (75 mg total) by mouth daily.  Current Outpatient Medications (Other):    B Complex-C (B-COMPLEX WITH VITAMIN C) tablet, Take 1 tablet by mouth daily with lunch.   Cholecalciferol (VITAMIN D3) 50 MCG (2000 UT) TABS, Take 2,000 Units by mouth daily with lunch. B complex, vitamin c 500   Glucosamine-Chondroitin (COSAMIN DS PO), Take 1 tablet by mouth every evening.   methocarbamol (ROBAXIN) 500 MG tablet, Take 500 mg by mouth every 8 (eight) hours as needed for muscle spasms.   Multiple Vitamin (MULTIVITAMIN WITH MINERALS) TABS tablet, Take 1 tablet by mouth daily with lunch. Adult 50+   scopolamine (TRANSDERM-SCOP) 1 MG/3DAYS, Place 1 patch onto the skin every three (3) days as needed (nausea).   vitamin C (ASCORBIC ACID) 500 MG tablet, Take 500 mg by mouth daily with lunch.   Zinc 50 MG TABS, Take 50 mg by mouth every evening.   Reviewed prior external information including notes and imaging from  primary care provider As well as notes that were available from care everywhere and other healthcare systems.  Past medical history, social, surgical and family history all reviewed in electronic medical record.  No pertanent information  unless stated regarding to the chief complaint.   Review of Systems:  No headache, visual changes, nausea, vomiting, diarrhea, constipation, dizziness, abdominal pain, skin rash, fevers, chills, night sweats, weight loss, swollen lymph nodes, body aches, joint swelling, chest pain, shortness of breath, mood changes. POSITIVE muscle aches  Objective  Blood pressure 116/64, pulse 74, height '5\' 11"'$  (1.803 m), weight 245 lb (111.1 kg), SpO2 94 %.   General: No apparent distress alert and oriented x3 mood and affect normal, dressed appropriately.  HEENT: Pupils equal, extraocular movements intact  Respiratory: Patient's speak in full sentences and does not appear short of breath  Cardiovascular: No lower extremity edema, non tender, no erythema  Low back does have significant loss of lordosis.  Does have difficulty going from a seated to standing position.  The patient does not go past neutral position with extension of the back.  Does seem to have some neuropathy of the lower extremities.    Impression and Recommendations:    The above documentation has been reviewed and is accurate and complete Lyndal Pulley, DO

## 2022-09-01 ENCOUNTER — Ambulatory Visit (INDEPENDENT_AMBULATORY_CARE_PROVIDER_SITE_OTHER): Payer: Medicare Other | Admitting: Family Medicine

## 2022-09-01 DIAGNOSIS — I25118 Atherosclerotic heart disease of native coronary artery with other forms of angina pectoris: Secondary | ICD-10-CM

## 2022-09-01 DIAGNOSIS — S32009K Unspecified fracture of unspecified lumbar vertebra, subsequent encounter for fracture with nonunion: Secondary | ICD-10-CM | POA: Diagnosis not present

## 2022-09-01 NOTE — Patient Instructions (Addendum)
Good to see you! I think you need to proud We can watch the weight, but not concerned B6 200 mg daily will help B12 Continue sport drink with working out

## 2022-09-01 NOTE — Assessment & Plan Note (Addendum)
Continued some discussion with patient's pain and arthritic changes.  Still holding out to try to avoid any surgical intervention.  Discussed with patient about icing regimen and home exercises, discussed with patient continue with formal physical therapy.  Can continue with the epidurals.  Follow-up with me again in 3 to 4 months total time discussing with patient 33 minutes

## 2022-09-02 ENCOUNTER — Ambulatory Visit (INDEPENDENT_AMBULATORY_CARE_PROVIDER_SITE_OTHER): Payer: Medicare Other

## 2022-09-02 DIAGNOSIS — R0609 Other forms of dyspnea: Secondary | ICD-10-CM | POA: Diagnosis not present

## 2022-09-02 DIAGNOSIS — I25118 Atherosclerotic heart disease of native coronary artery with other forms of angina pectoris: Secondary | ICD-10-CM | POA: Diagnosis not present

## 2022-09-02 DIAGNOSIS — I10 Essential (primary) hypertension: Secondary | ICD-10-CM

## 2022-09-02 LAB — ECHOCARDIOGRAM COMPLETE
Area-P 1/2: 3.21 cm2
S' Lateral: 4.24 cm

## 2022-09-14 DIAGNOSIS — L814 Other melanin hyperpigmentation: Secondary | ICD-10-CM | POA: Diagnosis not present

## 2022-09-14 DIAGNOSIS — L570 Actinic keratosis: Secondary | ICD-10-CM | POA: Diagnosis not present

## 2022-09-14 DIAGNOSIS — Z08 Encounter for follow-up examination after completed treatment for malignant neoplasm: Secondary | ICD-10-CM | POA: Diagnosis not present

## 2022-09-14 DIAGNOSIS — L989 Disorder of the skin and subcutaneous tissue, unspecified: Secondary | ICD-10-CM | POA: Diagnosis not present

## 2022-09-14 DIAGNOSIS — Z85820 Personal history of malignant melanoma of skin: Secondary | ICD-10-CM | POA: Diagnosis not present

## 2022-09-14 DIAGNOSIS — I10 Essential (primary) hypertension: Secondary | ICD-10-CM | POA: Diagnosis not present

## 2022-09-14 DIAGNOSIS — D225 Melanocytic nevi of trunk: Secondary | ICD-10-CM | POA: Diagnosis not present

## 2022-09-14 DIAGNOSIS — D492 Neoplasm of unspecified behavior of bone, soft tissue, and skin: Secondary | ICD-10-CM | POA: Diagnosis not present

## 2022-09-14 DIAGNOSIS — L821 Other seborrheic keratosis: Secondary | ICD-10-CM | POA: Diagnosis not present

## 2022-09-14 DIAGNOSIS — Z85828 Personal history of other malignant neoplasm of skin: Secondary | ICD-10-CM | POA: Diagnosis not present

## 2022-09-14 LAB — BASIC METABOLIC PANEL
BUN/Creatinine Ratio: 29 — ABNORMAL HIGH (ref 10–24)
BUN: 33 mg/dL — ABNORMAL HIGH (ref 8–27)
CO2: 21 mmol/L (ref 20–29)
Calcium: 9.4 mg/dL (ref 8.6–10.2)
Chloride: 102 mmol/L (ref 96–106)
Creatinine, Ser: 1.14 mg/dL (ref 0.76–1.27)
Glucose: 109 mg/dL — ABNORMAL HIGH (ref 70–99)
Potassium: 4.2 mmol/L (ref 3.5–5.2)
Sodium: 139 mmol/L (ref 134–144)
eGFR: 66 mL/min/{1.73_m2} (ref >59)

## 2022-09-14 LAB — CBC
Hematocrit: 39.2 % (ref 37.5–51.0)
Hemoglobin: 12.9 g/dL — ABNORMAL LOW (ref 13.0–17.7)
MCH: 32.9 pg (ref 26.6–33.0)
MCHC: 32.9 g/dL (ref 31.5–35.7)
MCV: 100 fL — ABNORMAL HIGH (ref 79–97)
Platelets: 169 10*3/uL (ref 150–450)
RBC: 3.92 x10E6/uL — ABNORMAL LOW (ref 4.14–5.80)
RDW: 14.5 % (ref 11.6–15.4)
WBC: 7.3 10*3/uL (ref 3.4–10.8)

## 2022-09-28 ENCOUNTER — Other Ambulatory Visit: Payer: Self-pay | Admitting: Internal Medicine

## 2022-09-28 DIAGNOSIS — Z23 Encounter for immunization: Secondary | ICD-10-CM | POA: Diagnosis not present

## 2022-09-29 ENCOUNTER — Other Ambulatory Visit (HOSPITAL_BASED_OUTPATIENT_CLINIC_OR_DEPARTMENT_OTHER): Payer: Self-pay

## 2022-09-29 MED ORDER — OXYCODONE-ACETAMINOPHEN 5-325 MG PO TABS
1.0000 | ORAL_TABLET | Freq: Four times a day (QID) | ORAL | 0 refills | Status: DC | PRN
  Filled 2022-09-29: qty 120, 30d supply, fill #0

## 2022-09-29 MED ORDER — OXYCODONE-ACETAMINOPHEN 5-325 MG PO TABS: 1.0000 | ORAL_TABLET | Freq: Four times a day (QID) | ORAL | 0 refills | Status: DC | PRN

## 2022-09-30 ENCOUNTER — Other Ambulatory Visit (HOSPITAL_BASED_OUTPATIENT_CLINIC_OR_DEPARTMENT_OTHER): Payer: Self-pay

## 2022-10-17 DIAGNOSIS — D034 Melanoma in situ of scalp and neck: Secondary | ICD-10-CM | POA: Diagnosis not present

## 2022-10-19 DIAGNOSIS — R7303 Prediabetes: Secondary | ICD-10-CM | POA: Diagnosis not present

## 2022-10-19 DIAGNOSIS — E782 Mixed hyperlipidemia: Secondary | ICD-10-CM | POA: Diagnosis not present

## 2022-10-19 DIAGNOSIS — M5136 Other intervertebral disc degeneration, lumbar region: Secondary | ICD-10-CM | POA: Diagnosis not present

## 2022-10-19 DIAGNOSIS — D039 Melanoma in situ, unspecified: Secondary | ICD-10-CM | POA: Diagnosis not present

## 2022-10-19 DIAGNOSIS — N411 Chronic prostatitis: Secondary | ICD-10-CM | POA: Diagnosis not present

## 2022-10-19 DIAGNOSIS — I700 Atherosclerosis of aorta: Secondary | ICD-10-CM | POA: Diagnosis not present

## 2022-10-19 DIAGNOSIS — I2510 Atherosclerotic heart disease of native coronary artery without angina pectoris: Secondary | ICD-10-CM | POA: Diagnosis not present

## 2022-10-19 DIAGNOSIS — Z23 Encounter for immunization: Secondary | ICD-10-CM | POA: Diagnosis not present

## 2022-10-19 DIAGNOSIS — I4892 Unspecified atrial flutter: Secondary | ICD-10-CM | POA: Diagnosis not present

## 2022-10-19 DIAGNOSIS — I10 Essential (primary) hypertension: Secondary | ICD-10-CM | POA: Diagnosis not present

## 2022-10-19 DIAGNOSIS — M1990 Unspecified osteoarthritis, unspecified site: Secondary | ICD-10-CM | POA: Diagnosis not present

## 2022-10-19 DIAGNOSIS — E538 Deficiency of other specified B group vitamins: Secondary | ICD-10-CM | POA: Diagnosis not present

## 2022-10-28 ENCOUNTER — Telehealth: Payer: Self-pay | Admitting: Internal Medicine

## 2022-10-28 NOTE — Telephone Encounter (Signed)
Pt would like a callback regarding lab bill. Please advise

## 2022-10-31 NOTE — Telephone Encounter (Signed)
I returned [name] call and he advised that he thinks issue is resolved.  [facility] was going to attach hs insurance information to a charge he was billed for.  I advised him to call us back if it was not resolved.

## 2022-11-01 DIAGNOSIS — H25813 Combined forms of age-related cataract, bilateral: Secondary | ICD-10-CM | POA: Diagnosis not present

## 2022-11-09 DIAGNOSIS — D034 Melanoma in situ of scalp and neck: Secondary | ICD-10-CM | POA: Diagnosis not present

## 2022-11-09 DIAGNOSIS — L989 Disorder of the skin and subcutaneous tissue, unspecified: Secondary | ICD-10-CM | POA: Diagnosis not present

## 2022-11-15 DIAGNOSIS — M5416 Radiculopathy, lumbar region: Secondary | ICD-10-CM | POA: Diagnosis not present

## 2022-11-15 DIAGNOSIS — M5116 Intervertebral disc disorders with radiculopathy, lumbar region: Secondary | ICD-10-CM | POA: Diagnosis not present

## 2022-11-15 NOTE — Progress Notes (Unsigned)
Cardiology Office Note   Date:  11/17/2022   ID:  [name], [name] November 14, 1946, MRN [id number]  PCP:  [name], MD  Cardiologist:   [name], MD  Pt presents for f/u of CAD     History of Present Illness: [name] is a 76 y.o. male with a history of paroxy atrial flutter (rare, sensed), HTN and HL  Echo in 2017 LVEF was normal   Holter monitor showed 8.7% PVCs  Freq PACs    In 2018    Complained of SOB with activity CT negatvie for PE   Calcifications of coronary arteries noted   Myoview normal   In June 2022  he complained complained of  DOE      He went on to have a CT coronary angiogram and then LHC (see below) and eventual atherectomy with DES to LAD (see below)   I saw the pt in clinic in June 2023   He was seen  by [name] since for SOB   Echo done  LVEF normal     The pt continues to see Z Tamala [name]   Working out at [employer] 3x per week  Biggest problem is balance and back  Since seen he says his breathing is OK   Denies  CP   Remains active    Just had melanoma removed for 2nd time on scalp  Outpatient Medications Prior to Visit  Medication Sig Dispense Refill   amLODipine (NORVASC) 5 MG tablet Take 1 tablet (5 mg total) by mouth 2 (two) times daily. 180 tablet 2   aspirin EC 81 MG tablet Take 81 mg by mouth every evening. Swallow whole.     B Complex-C (B-COMPLEX WITH VITAMIN C) tablet Take 1 tablet by mouth daily with lunch.     benazepril (LOTENSIN) 20 MG tablet Take 1 tablet (20 mg total) by mouth 2 (two) times daily. 180 tablet 3   celecoxib (CELEBREX) 200 MG capsule Take 200 mg by mouth as directed. As needed for pain     Cholecalciferol (VITAMIN D3) 50 MCG (2000 UT) TABS Take 2,000 Units by mouth daily with lunch. B complex, vitamin c 500     clopidogrel (PLAVIX) 75 MG tablet Take 1 tablet (75 mg total) by mouth daily. 90 tablet 3   Glucosamine-Chondroitin (COSAMIN DS PO) Take 1 tablet by mouth every evening.     hydrochlorothiazide  (,MICROZIDE/HYDRODIURIL,) 12.5 MG capsule Take 12.5 mg by mouth daily with lunch.      methocarbamol (ROBAXIN) 500 MG tablet Take 500 mg by mouth every 8 (eight) hours as needed for muscle spasms.     metoprolol tartrate (LOPRESSOR) 25 MG tablet Take 25 mg by mouth daily as needed (afib.).      Multiple Vitamin (MULTIVITAMIN WITH MINERALS) TABS tablet Take 1 tablet by mouth daily with lunch. Adult 50+     nitroGLYCERIN (NITROSTAT) 0.4 MG SL tablet Place 1 tablet (0.4 mg total) under the tongue every 5 (five) minutes as needed. 25 tablet 2   oxyCODONE (ROXICODONE) 5 MG immediate release tablet Take 1-2 tablets (5-10 mg total) by mouth every 4 (four) hours as needed for severe pain or breakthrough pain. 120 tablet 0   oxyCODONE-acetaminophen (PERCOCET/ROXICET) 5-325 MG tablet Take 1 tablet by mouth every 6 (six) hours as needed. 120 tablet 0   oxyCODONE-acetaminophen (PERCOCET/ROXICET) 5-325 MG tablet Take 1 tablet by mouth every 6 (six) hours as needed. 120 tablet 0   [START ON 11/27/2022]  oxyCODONE-acetaminophen (PERCOCET/ROXICET) 5-325 MG tablet Take 1 tablet by mouth every 6 (six) hours as needed. 120 tablet 0   rosuvastatin (CRESTOR) 10 MG tablet Take 1 tablet (10 mg total) by mouth daily. 90 tablet 3   rosuvastatin (CRESTOR) 5 MG tablet Take 1 tablet (5 mg total) by mouth daily. Please keep scheduled appointment for future refills. Thank you. 90 tablet 0   scopolamine (TRANSDERM-SCOP) 1 MG/3DAYS Place 1 patch onto the skin every three (3) days as needed (nausea).     vitamin C (ASCORBIC ACID) 500 MG tablet Take 500 mg by mouth daily with lunch.     Zinc 50 MG TABS Take 50 mg by mouth every evening.     No facility-administered medications prior to visit.     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   Paroxysmal atrial flutter ([place])     Past Surgical History:  Procedure Laterality Date   ABDOMINAL EXPOSURE N/A 01/06/2020   Procedure: ABDOMINAL EXPOSURE;  Surgeon: Rosetta Posner, MD;  [facility];  Service: Vascular;  Laterality: N/A;   ANTERIOR LUMBAR FUSION N/A 01/06/2020   Procedure: Lumbar Five- Sacral One  Anterior lumbar interbody fusion with infuse;  Surgeon: Kristeen Miss, MD;  Location: [facility];  Service: Neurosurgery;  Laterality: N/A;  Lumbar Five- Sacral One  Anterior lumbar interbody fusion with infuse   CERVICAL FUSION  1996, 1997   2 surgery- skull to C7   COLON RESECTION  2007   COLONOSCOPY W/ POLYPECTOMY     CORONARY ATHERECTOMY N/A 07/01/2021   Procedure: CORONARY ATHERECTOMY;  Surgeon: Burnell Blanks, MD;  Location: [facility];  Service: Cardiovascular;  Laterality: N/A;   CORONARY STENT INTERVENTION N/A 07/01/2021   Procedure: CORONARY STENT INTERVENTION;  Surgeon: Burnell Blanks, MD;  Location: [facility];  Service: Cardiovascular;  Laterality: N/A;   HEMORRHOID SURGERY N/A 01/13/2016   Procedure: HEMORRHOIDECTOMY AND REMOVAL OF ANAL SKIN TAG;  Surgeon: Coralie Keens, MD;  Location: [facility];  Service: General;  Laterality: N/A;   INTRAVASCULAR PRESSURE WIRE/FFR STUDY N/A 06/18/2021   Procedure: INTRAVASCULAR PRESSURE WIRE/FFR STUDY;  Surgeon: Burnell Blanks, MD;  Location: [facility];  Service: Cardiovascular;  Laterality: N/A;   INTRAVASCULAR ULTRASOUND/IVUS N/A 07/01/2021   Procedure: Intravascular Ultrasound/IVUS;  Surgeon: Burnell Blanks, MD;  Location: [facility];  Service: Cardiovascular;  Laterality: N/A;   REPLACEMENT TOTAL KNEE Left 2005   left   RIGHT/LEFT HEART CATH AND CORONARY ANGIOGRAPHY N/A 06/18/2021   Procedure: RIGHT/LEFT HEART CATH AND CORONARY ANGIOGRAPHY;  Surgeon: Burnell Blanks, MD;  Location: [facility];  Service: Cardiovascular;  Laterality: N/A;   SYNOVIAL CYST EXCISION     lumbar spine   TONSILLECTOMY     TOTAL KNEE ARTHROPLASTY Right 01/06/2017   Procedure: RIGHT TOTAL KNEE ARTHROPLASTY;  Surgeon: Sydnee Cabal, MD;  [facility];  Service: Orthopedics;  Laterality: Right;  Adductior Block     Social History:  The patient  reports that he has never smoked. He has never used smokeless tobacco. He reports that he does not drink alcohol and does not use drugs.   Family History:  The patient's family history includes Coronary artery disease in his father and mother.    ROS:  Please see the history of present illness. All other systems are reviewed and  Negative to the above problem except as noted.    PHYSICAL EXAM: VS:  BP 118/60   Pulse (!) 55   Ht '5\' 11"'$  (1.803 m)   Wt 259 lb 3.2 oz (117.6 kg)   SpO2 97%   BMI 36.15 kg/m    GEN:  Morbidly obese  in no acute distress  HEENT: normal  Neck: JVP normal  No carotid bruit Cardiac: RRR.  No  murmur  No LE edema  Respiratory:  clear to auscultation bilaterally,  GI: soft, nontender, nondistended, + BS   MS: no deformity Moving all extremities   Skin: warm and dry, no rash Neuro:  Deferred  Psych: euthymic mood, full affect   EKG:  EKG is  not ordered    CARDIAC STUDIES     Echo Sept 2023 Left ventricular ejection fraction, by estimation, is 55 to 60%. The left ventricle has normal function. The left ventricle has no regional wall motion abnormalities. The left ventricular internal cavity size was mildly dilated. Left ventricular diastolic parameters are consistent with Grade I diastolic dysfunction  (impaired relaxation). 1. Right ventricular systolic function is normal. The right ventricular size is normal. Tricuspid regurgitation signal is inadequate for assessing PA pressure. 2. The mitral valve is grossly normal. Trivial mitral valve regurgitation. No evidence of mitral stenosis. 3. The aortic valve is tricuspid. There is mild calcification of the aortic valve. Aortic valve regurgitation is not visualized. Aortic valve sclerosis is present, with no evidence of aortic valve stenosis. 4. The inferior vena cava is dilated in size with >50% respiratory variability, suggesting right atrial pressure of 8 mmHg.  ATHRECTOMY  07/01/21    Prox LAD to Mid LAD lesion is 80% stenosed.   Mid LAD lesion is 80% stenosed.   A drug-eluting stent was successfully placed using a STENT ONYX FRONTIER 2.75X12, STENT ONYX FRONTIER 2.5 x 15, STENT ONYX FRONTIER 2.25 x 8.   Post intervention, there is a 0% residual stenosis.   Post intervention, there is a 0% residual stenosis.   Severe mid LAD stenosis Successful orbital atherectomy mid LAD with placement of 3 overlapping drug eluting stents.    Recommendations: Continue DAPT with ASA and Plavix for at least 12 months given long overlapping stent segment. Will discharge home today after 6 hours bedrest.   Cardiac cath 06/18/21  Ost RCA to Prox RCA lesion is 50% stenosed. Mid RCA lesion is 40% stenosed. Dist RCA lesion is 30% stenosed. Prox LAD to Mid LAD lesion is 80% stenosed. Mid LAD lesion is 80% stenosed.   1. Severe, heavily calcified proximal and mid LAD stenosis. This is flow limiting by pressure wire analysis.  2. No obstructive disease in the Circumflex 3. Moderate non-obstructive disease in the ostial/proximal RCA, mid RCA 4. No significant elevation of right and left heart pressures   Recommendations: He has complex heavily calcified disease in the proximal and mid LAD. The more proximal lesion involves a large septal branch and the  mid lesion involves a large  diagonal branch. He will be brought back in several weeks for staged PCI of the LAD with orbital atherectomy and stenting. I suspect that it will require a long segment of stenting from the proximal through mid LAD. Will load with Plavix as an outpatient.  Chest CT  1. Stable subpleural density in the peripheral right middle lobe likely represents scarring. 2. Mildly prominent bilateral axillary lymph nodes again noted of unclear significance and potentially representing reactive nodes. The largest right axillary lymph node shows slight decrease in size since 2018.   CT coronary angiogram  Coronary calcium score: The patient's coronary artery calcium score is 1948, which places the patient in the 88th percentile.   Coronary arteries: Normal coronary origins.  Right dominance.   Right Coronary Artery: Dominant. Heavily calcified. Diffuse mixed mild 25-49% stenoses (CADRADS2), with likely moderate (CADRADS3) stenosis in the mid-vessel.   Left Main Coronary Artery: Normal. Trifurcates into the LAD, small ramus intermedius branch and LCX arteries.   Left Anterior Descending Coronary Artery: Heavily calcified proximal LAD with at least moderate 50-69% (CADRADS3) to possibly severe proximal stenosis. Smaller mid-diagonal branch with minimal 1-24% mixed (CADRADS1) stenosis.   Ramus Intermedius Coronary Artery: Smaller caliber, calcified proximally with probably minimal 1-24% stenosis (CADRADS1).   Left Circumflex Artery: The LCx is poorly visualized. There is at least mild mixed 25-49% proximal stenosis (CADRADS2).   Aorta: Normal size, 30 mm at the mid ascending aorta (level of the PA bifurcation) measured double oblique. Aortic atherosclerosis. No dissection.   Aortic Valve: Trileaflet.  Annular calcification.   Other findings:   Normal pulmonary vein drainage into the left atrium.   Normal left atrial appendage without a thrombus.   Normal size  of the pulmonary artery.   IMPRESSION: 1. Heavy multivessel coronary calcification with moderate to possibly severe CAD of the LAD, CADRADS = 3. CT FFR will be performed and reported separately.   2. Coronary calcium score of 1948. This was 88th percentile for age and sex matched control.   3. Normal coronary origin with right dominance.   4. Aortic atherosclerosis.   Lipid Panel    Component Value Date/Time   CHOL 99 (L) 10/04/2021 1119   TRIG 106 10/04/2021 1119   HDL 30 (L) 10/04/2021 1119   CHOLHDL 3.3 10/04/2021 1119   CHOLHDL 4.4 09/05/2016 1029   VLDL 22 09/05/2016 1029   LDLCALC 49 10/04/2021 1119      Wt Readings from Last 3 Encounters:  11/17/22 259 lb 3.2 oz (117.6 kg)  09/01/22 245 lb (111.1 kg)  08/12/22 247 lb (112 kg)      ASSESSMENT AND PLAN:  1   CAD   PT with high Ca score and severe CAD, particularly of LAD   He is now s/p athrectomy and placement of overlapping Onyx stents.   Doing very well  He stopped ASA a few wks ago before skin surgery  He has not resumed   Continue Plavix  2  paoxysmal atrial flutter  Extremely symptomatic when he has had in past   REmote  No documented recurrence  Pt denies palpitations   3 HTN BP is excellent   4  HL  Continue Crestor   LDL 51  HDL 41     Continue to watch diet   Keep on statin    65   DJD   Pt is followed by Creig Hines  and H Elsner  Goes to [place] regularly     7  Obesity  Discussed diet  Limit carbs   He has done in the past    Disposition:   FU with  Me in 6 mnths     ADDENDUM:  Pt had CT coronary angiogram    THis showed CAD with dicreased FFR in the mid LAD FFR 071    LCx, RCA and Ramus without significant stenoses Given this I would recomm Lheart cath (and R if nothing significant found given symptoms of dyspnea)  Risk/benefits explained  Pt understands and agrees to proceed   Hydrate prior    Repeat labs    Signed, [name], MD  11/17/2022 9:52 AM    [facility] [place], [place], [place]  [address] Phone: [phone]; Fax: [phone]

## 2022-11-17 ENCOUNTER — Encounter: Payer: Self-pay | Admitting: Internal Medicine

## 2022-11-17 ENCOUNTER — Ambulatory Visit: Payer: Medicare Other | Attending: Internal Medicine | Admitting: Internal Medicine

## 2022-11-17 VITALS — BP 118/60 | HR 55 | Ht 71.0 in | Wt 259.2 lb

## 2022-11-17 DIAGNOSIS — I2510 Atherosclerotic heart disease of native coronary artery without angina pectoris: Secondary | ICD-10-CM | POA: Insufficient documentation

## 2022-11-17 DIAGNOSIS — D034 Melanoma in situ of scalp and neck: Secondary | ICD-10-CM | POA: Diagnosis not present

## 2022-11-17 NOTE — Patient Instructions (Signed)
Medication Instructions:   *If you need a refill on your cardiac medications before your next appointment, please call your pharmacy*   Lab Work:  If you have labs (blood work) drawn today and your tests are completely normal, you will receive your results only by: [place] (if you have MyChart) OR A paper copy in the mail If you have any lab test that is abnormal or we need to change your treatment, we will call you to review the results.   Testing/Procedures:    Follow-Up: At [facility], you and your health needs are our priority.  As part of our continuing mission to provide you with exceptional heart care, we have created designated Provider Care Teams.  These Care Teams include your primary Cardiologist (physician) and Advanced Practice Providers (APPs -  Physician Assistants and Nurse Practitioners) who all work together to provide you with the care you need, when you need it.  We recommend signing up for the patient portal called "MyChart".  Sign up information is provided on this After Visit Summary.  MyChart is used to connect with patients for Virtual Visits (Telemedicine).  Patients are able to view lab/test results, encounter notes, upcoming appointments, etc.  Non-urgent messages can be sent to your provider as well.   To learn more about what you can do with MyChart, go to [email or web address].    Your next appointment:   6 month(s)  The format for your next appointment:   In Person  Provider:   [name], MD     Other Instructions   Important Information About [place]

## 2022-11-24 DIAGNOSIS — Z86006 Personal history of melanoma in-situ: Secondary | ICD-10-CM | POA: Diagnosis not present

## 2022-11-24 DIAGNOSIS — L570 Actinic keratosis: Secondary | ICD-10-CM | POA: Diagnosis not present

## 2022-11-24 DIAGNOSIS — Z08 Encounter for follow-up examination after completed treatment for malignant neoplasm: Secondary | ICD-10-CM | POA: Diagnosis not present

## 2022-11-24 DIAGNOSIS — R229 Localized swelling, mass and lump, unspecified: Secondary | ICD-10-CM | POA: Diagnosis not present

## 2022-11-24 DIAGNOSIS — C439 Malignant melanoma of skin, unspecified: Secondary | ICD-10-CM | POA: Diagnosis not present

## 2022-12-02 ENCOUNTER — Other Ambulatory Visit: Payer: Self-pay | Admitting: Internal Medicine

## 2022-12-20 ENCOUNTER — Other Ambulatory Visit (HOSPITAL_BASED_OUTPATIENT_CLINIC_OR_DEPARTMENT_OTHER): Payer: Self-pay

## 2022-12-20 ENCOUNTER — Other Ambulatory Visit: Payer: Self-pay

## 2022-12-20 MED ORDER — OXYCODONE HCL 5 MG PO TABS
5.0000 mg | ORAL_TABLET | Freq: Four times a day (QID) | ORAL | 0 refills | Status: DC | PRN
  Filled 2023-02-13: qty 120, 30d supply, fill #0

## 2022-12-20 MED ORDER — OXYCODONE HCL 5 MG PO TABS
5.0000 mg | ORAL_TABLET | Freq: Four times a day (QID) | ORAL | 0 refills | Status: DC | PRN
  Filled 2022-12-20 – 2022-12-23 (×2): qty 120, 30d supply, fill #0

## 2022-12-20 MED ORDER — METHOCARBAMOL 500 MG PO TABS
500.0000 mg | ORAL_TABLET | Freq: Four times a day (QID) | ORAL | 2 refills | Status: DC | PRN
  Filled 2022-12-20 – 2022-12-23 (×2): qty 60, 15d supply, fill #0

## 2022-12-20 MED ORDER — CELECOXIB 200 MG PO CAPS
200.0000 mg | ORAL_CAPSULE | Freq: Every day | ORAL | 2 refills | Status: DC | PRN
  Filled 2022-12-20: qty 30, 30d supply, fill #0

## 2022-12-20 MED ORDER — OXYCODONE HCL 5 MG PO TABS: 5.0000 mg | ORAL_TABLET | Freq: Four times a day (QID) | ORAL | 0 refills | Status: DC | PRN

## 2022-12-21 ENCOUNTER — Other Ambulatory Visit (HOSPITAL_BASED_OUTPATIENT_CLINIC_OR_DEPARTMENT_OTHER): Payer: Self-pay

## 2022-12-23 ENCOUNTER — Other Ambulatory Visit (HOSPITAL_BASED_OUTPATIENT_CLINIC_OR_DEPARTMENT_OTHER): Payer: Self-pay

## 2022-12-26 ENCOUNTER — Other Ambulatory Visit: Payer: Self-pay

## 2022-12-27 ENCOUNTER — Other Ambulatory Visit: Payer: Self-pay | Admitting: Internal Medicine

## 2022-12-27 ENCOUNTER — Other Ambulatory Visit (HOSPITAL_BASED_OUTPATIENT_CLINIC_OR_DEPARTMENT_OTHER): Payer: Self-pay

## 2022-12-27 ENCOUNTER — Other Ambulatory Visit: Payer: Self-pay

## 2022-12-28 ENCOUNTER — Other Ambulatory Visit (HOSPITAL_BASED_OUTPATIENT_CLINIC_OR_DEPARTMENT_OTHER): Payer: Self-pay

## 2022-12-28 ENCOUNTER — Telehealth: Payer: Self-pay | Admitting: Internal Medicine

## 2022-12-28 ENCOUNTER — Other Ambulatory Visit: Payer: Self-pay

## 2022-12-28 NOTE — Telephone Encounter (Signed)
Pt c/o medication issue:  1. Name of Medication:   rosuvastatin (CRESTOR) 10 MG tablet  2. How are you currently taking this medication (dosage and times per day)?   As prescribed  3. Are you having a reaction (difficulty breathing--STAT)?   No  4. What is your medication issue?     Patient stated he has 3 days left on both of these medications.  Patient stated he will need assistance getting this medication from [facility].  Patient stated his insurance will not recognize Dr. Harrington Challenger as his cardiologist.

## 2022-12-29 NOTE — Telephone Encounter (Signed)
Spoke with [facility] to clarify concerns with medication coverage and costs.  Advised by pharmacy that medications were ran through not using insurance and the price was lower than when ran on patient's insurance.  Spoke with patient to update him on the status and cost of his medications and advised him they are ready for pick up. Patient verbalized understanding and had no questions.

## 2022-12-29 NOTE — Telephone Encounter (Signed)
Patient called again stating his pharmacy, [facility] will not dispense his clopidogrel (PLAVIX) 75 MG tablet and rosuvastatin because they told him Dr. Harrington Challenger was not on the patient's insurance list.  Patient wants someone to call his pharmacy to clear this up with them so he can get his medication.  Patient noted he only had 2 tablets left.  Patient would like a call back for assistance with this.

## 2023-01-02 NOTE — Progress Notes (Signed)
[facility] [place] Phone: [phone] Subjective:   Rito Ehrlich, am serving as a scribe for Dr. Hulan Saas.  I'm seeing this patient by the request  of:  [name], MD  CC: back pain   [id number]  09/01/2022 Continued some discussion with patient's pain and arthritic changes.  Still holding out to try to avoid any surgical intervention.  Discussed with patient about icing regimen and home exercises, discussed with patient continue with formal physical therapy.  Can continue with the epidurals.  Follow-up with me again in 3 to 4 months total time discussing with patient 33 minutes   Update 01/05/2023 DEMON VOLANTE is a 77 y.o. male coming in with complaint of lumbar spine. Patient states that his back is doing terrible. States he is still working out to try and help.       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   Paroxysmal atrial flutter ([place])    Past Surgical History:  Procedure Laterality Date   ABDOMINAL EXPOSURE N/A 01/06/2020   Procedure: ABDOMINAL EXPOSURE;  Surgeon: Rosetta Posner, MD;  Location: [facility];  Service: Vascular;  Laterality: N/A;   ANTERIOR LUMBAR FUSION N/A 01/06/2020   Procedure: Lumbar Five- Sacral One  Anterior lumbar interbody fusion with infuse;  Surgeon: Kristeen Miss, MD;  Location: [facility];  Service: Neurosurgery;  Laterality: N/A;  Lumbar Five- Sacral One  Anterior lumbar interbody fusion with infuse   CERVICAL FUSION  1996, 1997   2 surgery- skull to C7    COLON RESECTION  2007   COLONOSCOPY W/ POLYPECTOMY     CORONARY ATHERECTOMY N/A 07/01/2021   Procedure: CORONARY ATHERECTOMY;  Surgeon: Burnell Blanks, MD;  Location: [place] [facility];  Service: Cardiovascular;  Laterality: N/A;   CORONARY STENT INTERVENTION N/A 07/01/2021   Procedure: CORONARY STENT INTERVENTION;  Surgeon: Burnell Blanks, MD;  Location: [place] [facility];  Service: Cardiovascular;  Laterality: N/A;   HEMORRHOID SURGERY N/A 01/13/2016   Procedure: HEMORRHOIDECTOMY AND REMOVAL OF ANAL SKIN TAG;  Surgeon: Coralie Keens, MD;  Location: [place];  Service: General;  Laterality: N/A;   INTRAVASCULAR PRESSURE WIRE/FFR STUDY N/A 06/18/2021   Procedure: INTRAVASCULAR PRESSURE WIRE/FFR STUDY;  Surgeon: Burnell Blanks, MD;  Location: [place] [facility];  Service: Cardiovascular;  Laterality: N/A;   INTRAVASCULAR ULTRASOUND/IVUS N/A 07/01/2021   Procedure: Intravascular Ultrasound/IVUS;  Surgeon: Burnell Blanks, MD;  Location: [place] [facility];  Service: Cardiovascular;  Laterality: N/A;   REPLACEMENT TOTAL KNEE Left 2005   left   RIGHT/LEFT HEART CATH AND CORONARY ANGIOGRAPHY N/A 06/18/2021   Procedure: RIGHT/LEFT HEART CATH AND CORONARY ANGIOGRAPHY;  Surgeon: Burnell Blanks, MD;  Location: [place] [facility];  Service: Cardiovascular;  Laterality: N/A;   SYNOVIAL CYST EXCISION     lumbar spine   TONSILLECTOMY     TOTAL KNEE ARTHROPLASTY Right 01/06/2017   Procedure: RIGHT TOTAL KNEE ARTHROPLASTY;  Surgeon: Herbie Baltimore  Theda Sers, MD;  Location: [facility];  Service: Orthopedics;  Laterality: Right;  Adductior Block   Social History   Socioeconomic History   Marital status: Married    Spouse name: Zigmund Daniel   Number of children: 4   Years of education: 12   Highest education level: Not on file  Occupational History   Occupation: Retired    Fish farm manager: RETIRED  Tobacco Use   Smoking status: Never   Smokeless tobacco: Never  Vaping Use   Vaping Use:  Never used  Substance and Sexual Activity   Alcohol use: No   Drug use: No   Sexual activity: Not on file  Other Topics Concern   Not on file  Social History Narrative   Patient lives at home with Wife Zigmund Daniel.    Patient has 4 children.    Patient is currently not working. He is retired and disabled.    Patient has a high school education.    Social Determinants of Health   Financial Resource Strain: Not on file  Food Insecurity: Not on file  Transportation Needs: Not on file  Physical Activity: Not on file  Stress: Not on file  Social Connections: Not on file   Allergies  Allergen Reactions   Alfuzosin Other (See Comments)    Felt winded/faint   Cymbalta [Duloxetine Hcl]     "Nausea, dizzy, sick"   Gabapentin Nausea Only   Pregabalin Nausea Only and Other (See Comments)    Felt poorly   Tamsulosin Hcl Other (See Comments)    Orthostatic hypotension   Contrast Media [Iodinated Contrast Media] Rash    Cath dye 06/18/21 rash started next day   Niacin Diarrhea   Family History  Problem Relation Age of Onset   Coronary artery disease Mother    Coronary artery disease Father    Colon cancer Neg Hx    Stomach cancer Neg Hx      Current Outpatient Medications (Cardiovascular):    amLODipine (NORVASC) 5 MG tablet, Take 1 tablet (5 mg total) by mouth 2 (two) times daily.   benazepril (LOTENSIN) 20 MG tablet, TAKE 1 TABLET BY MOUTH TWICE DAILY   hydrochlorothiazide (,MICROZIDE/HYDRODIURIL,) 12.5 MG capsule, Take 12.5 mg by mouth daily with lunch.    metoprolol tartrate (LOPRESSOR) 25 MG tablet, Take 25 mg by mouth daily as needed (afib.).    nitroGLYCERIN (NITROSTAT) 0.4 MG SL tablet, Place 1 tablet (0.4 mg total) under the tongue every 5 (five) minutes as needed.   rosuvastatin (CRESTOR) 10 MG tablet, Take 1 tablet (10 mg total) by mouth daily.   rosuvastatin (CRESTOR) 5 MG tablet, TAKE 1 TABLET BY MOUTH DAILY   Current Outpatient Medications (Analgesics):    aspirin EC 81  MG tablet, Take 81 mg by mouth every evening. Swallow whole.   celecoxib (CELEBREX) 200 MG capsule, Take 200 mg by mouth as directed. As needed for pain   celecoxib (CELEBREX) 200 MG capsule, Take 1 capsule (200 mg total) by mouth daily as needed.   [START ON 01/19/2023] oxyCODONE (OXY IR/ROXICODONE) 5 MG immediate release tablet, Take 1 tablet (5 mg total) by mouth every 6 (six) hours as needed. (DNF 01/19/23)   oxyCODONE (OXY IR/ROXICODONE) 5 MG immediate release tablet, Take 1 tablet (5 mg total) by mouth every 6 (six) hours as needed.   [START ON 02/17/2023] oxyCODONE (OXY IR/ROXICODONE) 5 MG immediate release tablet, Take 1 tablet (5 mg total) by mouth every 6 (six) hours as needed.  (DNF 02/17/23)   oxyCODONE (  ROXICODONE) 5 MG immediate release tablet, Take 1-2 tablets (5-10 mg total) by mouth every 4 (four) hours as needed for severe pain or breakthrough pain.  Current Outpatient Medications (Hematological):    clopidogrel (PLAVIX) 75 MG tablet, Take 1 tablet (75 mg total) by mouth daily.  Current Outpatient Medications (Other):    B Complex-C (B-COMPLEX WITH VITAMIN C) tablet, Take 1 tablet by mouth daily with lunch.   Cholecalciferol (VITAMIN D3) 50 MCG (2000 UT) TABS, Take 2,000 Units by mouth daily with lunch. B complex, vitamin c 500   Glucosamine-Chondroitin (COSAMIN DS PO), Take 1 tablet by mouth every evening.   methocarbamol (ROBAXIN) 500 MG tablet, Take 500 mg by mouth every 8 (eight) hours as needed for muscle spasms.   methocarbamol (ROBAXIN) 500 MG tablet, Take 1 tablet (500 mg total) by mouth every 6 (six) hours as needed for spasms.   Multiple Vitamin (MULTIVITAMIN WITH MINERALS) TABS tablet, Take 1 tablet by mouth daily with lunch. Adult 50+   scopolamine (TRANSDERM-SCOP) 1 MG/3DAYS, Place 1 patch onto the skin every three (3) days as needed (nausea).   vitamin C (ASCORBIC ACID) 500 MG tablet, Take 500 mg by mouth daily with lunch.   Zinc 50 MG TABS, Take 50 mg by mouth every  evening.   Reviewed prior external information including notes and imaging from  primary care provider As well as notes that were available from care everywhere and other healthcare systems.  Past medical history, social, surgical and family history all reviewed in electronic medical record.  No pertanent information unless stated regarding to the chief complaint.   Review of Systems:  No headache, visual changes, nausea, vomiting, diarrhea, constipation, dizziness, abdominal pain, skin rash, fevers, chills, night sweats, weight loss, swollen lymph nodes, body aches, joint swelling, chest pain, shortness of breath, mood changes. POSITIVE muscle aches  Objective  Blood pressure (!) 140/90, pulse 73, height '5\' 11"'$  (1.803 m), weight 253 lb (114.8 kg), SpO2 97 %.   General: No apparent distress alert and oriented x3 mood and affect normal, dressed appropriately.  HEENT: Pupils equal, extraocular movements intact  Respiratory: Patient's speak in full sentences and does not appear short of breath  Cardiovascular: No lower extremity edema, non tender, no erythema  Patient has significant loss of lordosis.  Patient neck has no range of motion.  Back exam also has limited range of motion.  Difficulty with FABER test noted.  Neurovascularly seems to be intact.    Impression and Recommendations:     The above documentation has been reviewed and is accurate and complete Lyndal Pulley, DO

## 2023-01-05 ENCOUNTER — Ambulatory Visit (INDEPENDENT_AMBULATORY_CARE_PROVIDER_SITE_OTHER): Payer: Medicare Other | Admitting: Family Medicine

## 2023-01-05 VITALS — BP 140/90 | HR 73 | Ht 71.0 in | Wt 253.0 lb

## 2023-01-05 DIAGNOSIS — S32009K Unspecified fracture of unspecified lumbar vertebra, subsequent encounter for fracture with nonunion: Secondary | ICD-10-CM | POA: Diagnosis not present

## 2023-01-05 NOTE — Assessment & Plan Note (Signed)
We discussed again at great length about the back.  The patient was unable to tolerate the Cymbalta, patient does not want to repeat physical therapy, we did discuss his dizziness as well as the neck pain that he has and how he could potentially respond to vestibular neuro.  Patient declined that at the moment and would like to continue with the exercises on his own.  We discussed again the pain that he is having with activity that seems to be better with rest does correspond with more of the spinal stenosis.  We were going to discuss more medications such as nortriptyline but patient would like to avoid any medications at the moment.  He is frustrated he is not making as much improvement but I do think he needs to be proud of going from only 1 minute on the elliptical to 25 minutes.  No large changes and follow-up again in 3 months.  Total time discussing with patient 33 minutes

## 2023-01-05 NOTE — Patient Instructions (Addendum)
Good to see you  I really think you need to be really proud of where you are at.  We have vestibular neuro rehab if need it Call me if you need something Follow up in 3 months

## 2023-01-06 ENCOUNTER — Telehealth: Payer: Self-pay

## 2023-01-06 MED ORDER — ROSUVASTATIN CALCIUM 10 MG PO TABS: 10.0000 mg | ORAL_TABLET | Freq: Every day | ORAL | 3 refills | Status: DC

## 2023-01-06 NOTE — Telephone Encounter (Signed)
Reviewed with patient that Dr. Harrington Challenger wanted him to be on 10 mg dose of crestor. Had patient check vial of recently reordered crestor, patient reporting it was only 5 mg dose. Refill sent to pharmacy for 10 mg dose, medication list updated. Patient verbalizes understanding to take 10 mg dose of crestor instead of 5 mg dose.

## 2023-01-17 DIAGNOSIS — C439 Malignant melanoma of skin, unspecified: Secondary | ICD-10-CM | POA: Diagnosis not present

## 2023-01-17 DIAGNOSIS — L814 Other melanin hyperpigmentation: Secondary | ICD-10-CM | POA: Diagnosis not present

## 2023-01-17 DIAGNOSIS — L02821 Furuncle of head [any part, except face]: Secondary | ICD-10-CM | POA: Diagnosis not present

## 2023-01-17 DIAGNOSIS — D225 Melanocytic nevi of trunk: Secondary | ICD-10-CM | POA: Diagnosis not present

## 2023-01-17 DIAGNOSIS — Z08 Encounter for follow-up examination after completed treatment for malignant neoplasm: Secondary | ICD-10-CM | POA: Diagnosis not present

## 2023-01-17 DIAGNOSIS — Z86006 Personal history of melanoma in-situ: Secondary | ICD-10-CM | POA: Diagnosis not present

## 2023-01-17 DIAGNOSIS — L821 Other seborrheic keratosis: Secondary | ICD-10-CM | POA: Diagnosis not present

## 2023-01-17 DIAGNOSIS — L570 Actinic keratosis: Secondary | ICD-10-CM | POA: Diagnosis not present

## 2023-01-17 DIAGNOSIS — R229 Localized swelling, mass and lump, unspecified: Secondary | ICD-10-CM | POA: Diagnosis not present

## 2023-01-19 ENCOUNTER — Other Ambulatory Visit: Payer: Medicare Other

## 2023-01-19 ENCOUNTER — Other Ambulatory Visit: Payer: Self-pay

## 2023-01-19 DIAGNOSIS — N138 Other obstructive and reflux uropathy: Secondary | ICD-10-CM | POA: Diagnosis not present

## 2023-01-19 DIAGNOSIS — N401 Enlarged prostate with lower urinary tract symptoms: Secondary | ICD-10-CM

## 2023-01-20 LAB — PSA: Prostate Specific Ag, Serum: 0.6 ng/mL (ref 0.0–4.0)

## 2023-01-26 DIAGNOSIS — M5416 Radiculopathy, lumbar region: Secondary | ICD-10-CM | POA: Diagnosis not present

## 2023-01-26 DIAGNOSIS — M5116 Intervertebral disc disorders with radiculopathy, lumbar region: Secondary | ICD-10-CM | POA: Diagnosis not present

## 2023-02-03 ENCOUNTER — Ambulatory Visit (INDEPENDENT_AMBULATORY_CARE_PROVIDER_SITE_OTHER): Payer: Medicare Other | Admitting: Urology

## 2023-02-03 ENCOUNTER — Encounter: Payer: Self-pay | Admitting: Urology

## 2023-02-03 VITALS — BP 130/62 | HR 72

## 2023-02-03 DIAGNOSIS — N138 Other obstructive and reflux uropathy: Secondary | ICD-10-CM | POA: Diagnosis not present

## 2023-02-03 DIAGNOSIS — N411 Chronic prostatitis: Secondary | ICD-10-CM

## 2023-02-03 DIAGNOSIS — N401 Enlarged prostate with lower urinary tract symptoms: Secondary | ICD-10-CM

## 2023-02-03 DIAGNOSIS — N5201 Erectile dysfunction due to arterial insufficiency: Secondary | ICD-10-CM

## 2023-02-03 DIAGNOSIS — R300 Dysuria: Secondary | ICD-10-CM

## 2023-02-03 LAB — URINALYSIS, ROUTINE W REFLEX MICROSCOPIC
Bilirubin, UA: NEGATIVE
Glucose, UA: NEGATIVE
Leukocytes,UA: NEGATIVE
Nitrite, UA: NEGATIVE
RBC, UA: NEGATIVE
Specific Gravity, UA: 1.02 (ref 1.005–1.030)
Urobilinogen, Ur: 0.2 mg/dL (ref 0.2–1.0)
pH, UA: 5 (ref 5.0–7.5)

## 2023-02-03 LAB — MICROSCOPIC EXAMINATION: Bacteria, UA: NONE SEEN

## 2023-02-03 MED ORDER — TADALAFIL 20 MG PO TABS: 20.0000 mg | ORAL_TABLET | Freq: Every day | ORAL | 5 refills | Status: DC | PRN

## 2023-02-03 NOTE — Patient Instructions (Signed)
Erectile Dysfunction ?Erectile dysfunction (ED) is the inability to get or keep an erection in order to have sexual intercourse. ED is considered a symptom of an underlying disorder and is not considered a disease. ED may include: ?Inability to get an erection. ?Lack of enough hardness of the erection to allow penetration. ?Loss of erection before sex is finished. ?What are the causes? ?This condition may be caused by: ?Physical causes, such as: ?Artery problems. This may include heart disease, high blood pressure, atherosclerosis, and diabetes. ?Hormonal problems, such as low testosterone. ?Obesity. ?Nerve problems. This may include back or pelvic injuries, multiple sclerosis, Parkinson's disease, spinal cord injury, and stroke. ?Certain medicines, such as: ?Pain relievers. ?Antidepressants. ?Blood pressure medicines and water pills (diuretics). ?Cancer medicines. ?Antihistamines. ?Muscle relaxants. ?Lifestyle factors, such as: ?Use of drugs such as marijuana, cocaine, or opioids. ?Excessive use of alcohol. ?Smoking. ?Lack of physical activity or exercise. ?Psychological causes, such as: ?Anxiety or stress. ?Sadness or depression. ?Exhaustion. ?Fear about sexual performance. ?Guilt. ?What are the signs or symptoms? ?Symptoms of this condition include: ?Inability to get an erection. ?Lack of enough hardness of the erection to allow penetration. ?Loss of the erection before sex is finished. ?Sometimes having normal erections, but with frequent unsatisfactory episodes. ?Low sexual satisfaction in either partner due to erection problems. ?A curved penis occurring with erection. The curve may cause pain, or the penis may be too curved to allow for intercourse. ?Never having nighttime or morning erections. ?How is this diagnosed? ?This condition is often diagnosed by: ?Performing a physical exam to find other diseases or specific problems with the penis. ?Asking you detailed questions about the problem. ?Doing tests,  such as: ?Blood tests to check for diabetes mellitus or high cholesterol, or to measure hormone levels. ?Other tests to check for underlying health conditions. ?An ultrasound exam to check for scarring. ?A test to check blood flow to the penis. ?Doing a sleep study at home to measure nighttime erections. ?How is this treated? ?This condition may be treated by: ?Medicines, such as: ?Medicine taken by mouth to help you achieve an erection (oral medicine). ?Hormone replacement therapy to replace low testosterone levels. ?Medicine that is injected into the penis. Your health care provider may instruct you how to give yourself these injections at home. ?Medicine that is delivered with a short applicator tube. The tube is inserted into the opening at the tip of the penis, which is the opening of the urethra. A tiny pellet of medicine is put in the urethra. The pellet dissolves and enhances erectile function. This is also called MUSE (medicated urethral system for erections) therapy. ?Vacuum pump. This is a pump with a ring on it. The pump and ring are placed on the penis and used to create pressure that helps the penis become erect. ?Penile implant surgery. In this procedure, you may receive: ?An inflatable implant. This consists of cylinders, a pump, and a reservoir. The cylinders can be inflated with a fluid that helps to create an erection, and they can be deflated after intercourse. ?A semi-rigid implant. This consists of two silicone rubber rods. The rods provide some rigidity. They are also flexible, so the penis can both curve downward in its normal position and become straight for sexual intercourse. ?Blood vessel surgery to improve blood flow to the penis. During this procedure, a blood vessel from a different part of the body is placed into the penis to allow blood to flow around (bypass) damaged or blocked blood vessels. ?Lifestyle changes,   such as exercising more, losing weight, and quitting smoking. ?Follow  these instructions at home: ?Medicines ? ?Take over-the-counter and prescription medicines only as told by your health care provider. Do not increase the dosage without first discussing it with your health care provider. ?If you are using self-injections, do injections as directed by your health care provider. Make sure you avoid any veins that are on the surface of the penis. After giving an injection, apply pressure to the injection site for 5 minutes. ?Talk to your health care provider about how to prevent headaches while taking ED medicines. These medicines may cause a sudden headache due to the increase in blood flow in your body. ?General instructions ?Exercise regularly, as directed by your health care provider. Work with your health care provider to lose weight, if needed. ?Do not use any products that contain nicotine or tobacco. These products include cigarettes, chewing tobacco, and vaping devices, such as e-cigarettes. If you need help quitting, ask your health care provider. ?Before using a vacuum pump, read the instructions that come with the pump and discuss any questions with your health care provider. ?Keep all follow-up visits. This is important. ?Contact a health care provider if: ?You feel nauseous. ?You are vomiting. ?You get sudden headaches while taking ED medicines. ?You have any concerns about your sexual health. ?Get help right away if: ?You are taking oral or injectable medicines and you have an erection that lasts longer than 4 hours. If your health care provider is unavailable, go to the nearest emergency room for evaluation. An erection that lasts much longer than 4 hours can result in permanent damage to your penis. ?You have severe pain in your groin or abdomen. ?You develop redness or severe swelling of your penis. ?You have redness spreading at your groin or lower abdomen. ?You are unable to urinate. ?You experience chest pain or a rapid heartbeat (palpitations) after taking oral  medicines. ?These symptoms may represent a serious problem that is an emergency. Do not wait to see if the symptoms will go away. Get medical help right away. Call your local emergency services (911 in the [place] Do not drive yourself to the hospital. ?Summary ?Erectile dysfunction (ED) is the inability to get or keep an erection during sexual intercourse. ?This condition is diagnosed based on a physical exam, your symptoms, and tests to determine the cause. Treatment varies depending on the cause and may include medicines, hormone therapy, surgery, or a vacuum pump. ?You may need follow-up visits to make sure that you are using your medicines or devices correctly. ?Get help right away if you are taking or injecting medicines and you have an erection that lasts longer than 4 hours. ?This information is not intended to replace advice given to you by your health care provider. Make sure you discuss any questions you have with your health care provider. ?Document Revised: 03/03/2021 Document Reviewed: 03/03/2021 ?Elsevier Patient Education ? 2023 [employer] ? ?

## 2023-02-03 NOTE — Progress Notes (Signed)
02/03/2023 12:32 PM   [name] [birth year] [id number]  Referring provider: [name], MD [address],  [place] [address]  Followup BPh and chronic prostatitis   HPI: [name] is a 77yo here for followup for BPH, chronic prostatitis and new complaint of erectile dysfunction. He used sildenafil 75m. IPSS 3 QOl 0. No dysuria or hematuria. No prostatitis flares. 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   Paroxysmal atrial flutter ([facility]     Surgical History: Past Surgical History:  Procedure Laterality Date   ABDOMINAL EXPOSURE N/A 01/06/2020   Procedure: ABDOMINAL EXPOSURE;  Surgeon: ERosetta Posner MD;  [facility];  Service: Vascular;  Laterality: N/A;   ANTERIOR LUMBAR FUSION N/A 01/06/2020   Procedure: Lumbar Five- Sacral One  Anterior lumbar interbody fusion with infuse;  Surgeon: EKristeen Miss MD;  [facility]  Service: Neurosurgery;  Laterality: N/A;  Lumbar Five- Sacral One  Anterior lumbar interbody fusion with infuse   CERVICAL FUSION  1996, 1997   2 surgery- skull to C7   COLON RESECTION  2007   COLONOSCOPY W/ POLYPECTOMY     CORONARY ATHERECTOMY N/A 07/01/2021   Procedure: CORONARY ATHERECTOMY;  Surgeon: MBurnell Blanks MD;  [facility];  Service: Cardiovascular;  Laterality: N/A;   CORONARY STENT INTERVENTION N/A 07/01/2021   Procedure: CORONARY STENT INTERVENTION;  Surgeon: MBurnell Blanks MD;  [facility];  Service: Cardiovascular;   Laterality: N/A;   HEMORRHOID SURGERY N/A 01/13/2016   Procedure: HEMORRHOIDECTOMY AND REMOVAL OF ANAL SKIN TAG;  Surgeon: DCoralie Keens MD;  [facility]  Service: General;  Laterality: N/A;   INTRAVASCULAR PRESSURE WIRE/FFR STUDY N/A 06/18/2021   Procedure: INTRAVASCULAR PRESSURE WIRE/FFR STUDY;  Surgeon: MBurnell Blanks MD;  [facility];  Service: Cardiovascular;  Laterality: N/A;   INTRAVASCULAR ULTRASOUND/IVUS N/A 07/01/2021   Procedure: Intravascular Ultrasound/IVUS;  Surgeon: MBurnell Blanks MD;  [facility];  Service: Cardiovascular;  Laterality: N/A;   REPLACEMENT TOTAL KNEE Left 2005   left   RIGHT/LEFT HEART CATH AND CORONARY ANGIOGRAPHY N/A 06/18/2021   Procedure: RIGHT/LEFT HEART CATH AND CORONARY ANGIOGRAPHY;  Surgeon: MBurnell Blanks MD;  [facility];  Service: Cardiovascular;  Laterality: N/A;   SYNOVIAL CYST EXCISION     lumbar spine   TONSILLECTOMY     TOTAL KNEE ARTHROPLASTY Right 01/06/2017   Procedure: RIGHT TOTAL KNEE ARTHROPLASTY;  Surgeon: RSydnee Cabal MD;  [facility];  Service: Orthopedics;  Laterality: Right;  Adductior Block    Home Medications:  Allergies as of 02/03/2023       Reactions   Alfuzosin Other (See Comments)   Felt winded/faint   Cymbalta [duloxetine Hcl]    "Nausea, dizzy, sick"   Gabapentin Nausea Only   Pregabalin Nausea Only, Other (See Comments)   Felt poorly   Tamsulosin Hcl Other (See Comments)   Orthostatic  hypotension   Contrast Media [iodinated Contrast Media] Rash   Cath dye 06/18/21 rash started next day   Niacin Diarrhea        Medication List        Accurate as of February 03, 2023 12:32 PM. If you have any questions, ask your nurse or doctor.          amLODipine 5 MG tablet Commonly known as: NORVASC Take 1 tablet (5 mg total) by mouth 2 (two) times daily.   ascorbic acid 500 MG tablet Commonly known as: VITAMIN C Take 500 mg  by mouth daily with lunch.   aspirin EC 81 MG tablet Take 81 mg by mouth every evening. Swallow whole.   B-complex with vitamin C tablet Take 1 tablet by mouth daily with lunch.   benazepril 20 MG tablet Commonly known as: LOTENSIN TAKE 1 TABLET BY MOUTH TWICE DAILY   CeleBREX 200 MG capsule Generic drug: celecoxib Take 200 mg by mouth as directed. As needed for pain   celecoxib 200 MG capsule Commonly known as: CELEBREX Take 1 capsule (200 mg total) by mouth daily as needed.   clopidogrel 75 MG tablet Commonly known as: PLAVIX Take 1 tablet (75 mg total) by mouth daily.   COSAMIN DS PO Take 1 tablet by mouth every evening.   hydrochlorothiazide 12.5 MG capsule Commonly known as: MICROZIDE Take 12.5 mg by mouth daily with lunch.   methocarbamol 500 MG tablet Commonly known as: ROBAXIN Take 500 mg by mouth every 8 (eight) hours as needed for muscle spasms.   methocarbamol 500 MG tablet Commonly known as: ROBAXIN Take 1 tablet (500 mg total) by mouth every 6 (six) hours as needed for spasms.   metoprolol tartrate 25 MG tablet Commonly known as: LOPRESSOR Take 25 mg by mouth daily as needed (afib.).   multivitamin with minerals Tabs tablet Take 1 tablet by mouth daily with lunch. Adult 50+   nitroGLYCERIN 0.4 MG SL tablet Commonly known as: Nitrostat Place 1 tablet (0.4 mg total) under the tongue every 5 (five) minutes as needed.   oxyCODONE 5 MG immediate release tablet Commonly known as: Roxicodone Take 1-2 tablets (5-10 mg total) by mouth every 4 (four) hours as needed for severe pain or breakthrough pain.   oxyCODONE 5 MG immediate release tablet Commonly known as: Oxy IR/ROXICODONE Take 1 tablet (5 mg total) by mouth every 6 (six) hours as needed.   oxyCODONE 5 MG immediate release tablet Commonly known as: Oxy IR/ROXICODONE Take 1 tablet (5 mg total) by mouth every 6 (six) hours as needed. (DNF 01/19/23)   oxyCODONE 5 MG immediate release  tablet Commonly known as: Oxy IR/ROXICODONE Take 1 tablet (5 mg total) by mouth every 6 (six) hours as needed.  (DNF 02/17/23) Start taking on: February 17, 2023   rosuvastatin 10 MG tablet Commonly known as: CRESTOR Take 1 tablet (10 mg total) by mouth daily.   scopolamine 1 MG/3DAYS Commonly known as: TRANSDERM-SCOP Place 1 patch onto the skin every three (3) days as needed (nausea).   Vitamin D3 50 MCG (2000 UT) Tabs Generic drug: Cholecalciferol Take 2,000 Units by mouth daily with lunch. B complex, vitamin c 500   Zinc 50 MG Tabs Take 50 mg by mouth every evening.        Allergies:  Allergies  Allergen Reactions   Alfuzosin Other (See Comments)    Felt winded/faint   Cymbalta [Duloxetine Hcl]     "Nausea, dizzy, sick"   Gabapentin  Nausea Only   Pregabalin Nausea Only and Other (See Comments)    Felt poorly   Tamsulosin Hcl Other (See Comments)    Orthostatic hypotension   Contrast Media [Iodinated Contrast Media] Rash    Cath dye 06/18/21 rash started next day   Niacin Diarrhea    Family History: Family History  Problem Relation Age of Onset   Coronary artery disease Mother    Coronary artery disease Father    Colon cancer Neg Hx    Stomach cancer Neg Hx     Social History:  reports that he has never smoked. He has never used smokeless tobacco. He reports that he does not drink alcohol and does not use drugs.  ROS: All other review of systems were reviewed and are negative except what is noted above in HPI  Physical Exam: BP 130/62   Pulse 72   Constitutional:  Alert and oriented, No acute distress. HEENT: [place] AT, moist mucus membranes.  Trachea midline, no masses. Cardiovascular: No clubbing, cyanosis, or edema. Respiratory: Normal respiratory effort, no increased work of breathing. GI: Abdomen is soft, nontender, nondistended, no abdominal masses GU: No CVA tenderness.  Lymph: No cervical or inguinal lymphadenopathy. Skin: No rashes, bruises or  suspicious lesions. Neurologic: Grossly intact, no focal deficits, moving all 4 extremities. Psychiatric: Normal mood and affect.  Laboratory Data: Lab Results  Component Value Date   WBC 7.3 09/14/2022   HGB 12.9 (L) 09/14/2022   HCT 39.2 09/14/2022   MCV 100 (H) 09/14/2022   PLT 169 09/14/2022    Lab Results  Component Value Date   CREATININE 1.14 09/14/2022    Lab Results  Component Value Date   PSA 0.5 09/05/2016   PSA 0.49 09/10/2015    No results found for: "TESTOSTERONE"  Lab Results  Component Value Date   HGBA1C 5.3 09/05/2016    Urinalysis    Component Value Date/Time   COLORURINE AMBER (A) 12/30/2016 1046   APPEARANCEUR Clear 01/28/2022 1439   LABSPEC 1.020 12/30/2016 1046   PHURINE 5.0 12/30/2016 1046   GLUCOSEU Negative 01/28/2022 1439   HGBUR NEGATIVE 12/30/2016 1046   BILIRUBINUR Negative 01/28/2022 1439   KETONESUR NEGATIVE 12/30/2016 1046   PROTEINUR Negative 01/28/2022 1439   PROTEINUR NEGATIVE 12/30/2016 1046   UROBILINOGEN 0.2 08/31/2007 1221   NITRITE Negative 01/28/2022 1439   NITRITE NEGATIVE 12/30/2016 1046   LEUKOCYTESUR Negative 01/28/2022 1439    Lab Results  Component Value Date   LABMICR Comment 01/28/2022   WBCUA None seen 07/27/2020   LABEPIT 0-10 07/27/2020   BACTERIA None seen 07/27/2020    Pertinent Imaging:  No results found for this or any previous visit.  No results found for this or any previous visit.  No results found for this or any previous visit.  No results found for this or any previous visit.  No results found for this or any previous visit.  No valid procedures specified. No results found for this or any previous visit.  No results found for this or any previous visit.   Assessment & Plan:    1. Benign prostatic hyperplasia with urinary obstruction -patient defers therapy at this time - Urinalysis, Routine w reflex microscopic  2. Chronic prostatitis without hematuria -continue  observation since patient has not had any flares in over 1 year  3. Dysuria Resolved  4. Erectile dysfunction -tadalafil 28m prn   No follow-ups on file.  PNicolette Bang MD  [facility]

## 2023-02-13 ENCOUNTER — Other Ambulatory Visit (HOSPITAL_BASED_OUTPATIENT_CLINIC_OR_DEPARTMENT_OTHER): Payer: Self-pay

## 2023-02-14 ENCOUNTER — Other Ambulatory Visit (HOSPITAL_BASED_OUTPATIENT_CLINIC_OR_DEPARTMENT_OTHER): Payer: Self-pay

## 2023-03-01 ENCOUNTER — Other Ambulatory Visit (HOSPITAL_BASED_OUTPATIENT_CLINIC_OR_DEPARTMENT_OTHER): Payer: Self-pay

## 2023-03-01 MED ORDER — OXYCODONE HCL 5 MG PO TABS
5.0000 mg | ORAL_TABLET | Freq: Four times a day (QID) | ORAL | 0 refills | Status: DC | PRN
  Filled 2023-04-26: qty 120, 30d supply, fill #0

## 2023-03-01 MED ORDER — METHOCARBAMOL 500 MG PO TABS
500.0000 mg | ORAL_TABLET | Freq: Four times a day (QID) | ORAL | 2 refills | Status: DC | PRN
  Filled 2023-03-01: qty 60, 15d supply, fill #0

## 2023-03-01 MED ORDER — OXYCODONE HCL 5 MG PO TABS: 5.0000 mg | ORAL_TABLET | Freq: Four times a day (QID) | ORAL | 0 refills | Status: DC | PRN

## 2023-03-08 ENCOUNTER — Other Ambulatory Visit (HOSPITAL_BASED_OUTPATIENT_CLINIC_OR_DEPARTMENT_OTHER): Payer: Self-pay

## 2023-03-17 ENCOUNTER — Other Ambulatory Visit (HOSPITAL_BASED_OUTPATIENT_CLINIC_OR_DEPARTMENT_OTHER): Payer: Self-pay

## 2023-04-05 NOTE — Progress Notes (Unsigned)
[name] [address] [place] [address] Phone: [phone] Subjective:   [name], am serving as a scribe for Dr. Antoine Primas.  I'm seeing this patient by the request  of:  Georgann Housekeeper, MD  CC: Low back and neck pain follow-up  ONG:[id number]  01/05/2023 We discussed again at great length about the back.  The patient was unable to tolerate the Cymbalta, patient does not want to repeat physical therapy, we did discuss his dizziness as well as the neck pain that he has and how he could potentially respond to vestibular neuro.  Patient declined that at the moment and would like to continue with the exercises on his own.  We discussed again the pain that he is having with activity that seems to be better with rest does correspond with more of the spinal stenosis.  We were going to discuss more medications such as nortriptyline but patient would like to avoid any medications at the moment.  He is frustrated he is not making as much improvement but I do think he needs to be proud of going from only 1 minute on the elliptical to 25 minutes.  No large changes and follow-up again in 3 months.  Total time discussing with patient 33 minutes      Update 04/06/2023 [name] is a 77 y.o. male coming in with complaint of lumbar spine pain. Patient states that he feels the same as the last visit. No better and no worse. Has been in the lap pool working on walking as he feels that his endurance is his biggest deficit. Able to walk 100 yards but no further due to a tight feeling in his back.   Has been increasing the weight he is lifting and does feel like he is stronger.       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   Paroxysmal atrial flutter ([facility])    Past Surgical History:  Procedure Laterality Date   ABDOMINAL EXPOSURE N/A 01/06/2020   Procedure: ABDOMINAL EXPOSURE;  Surgeon: Larina Earthly, MD;  [facility];  Service: Vascular;  Laterality: N/A;   ANTERIOR LUMBAR FUSION N/A 01/06/2020   Procedure: Lumbar Five- Sacral One  Anterior lumbar interbody fusion with infuse;  Surgeon: Barnett Abu, MD;  [facility];  Service: Neurosurgery;  Laterality: N/A;  Lumbar Five- Sacral One  Anterior lumbar interbody fusion with infuse   CERVICAL FUSION  1996, 1997   2 surgery- skull to C7   COLON RESECTION  2007   COLONOSCOPY W/ POLYPECTOMY     CORONARY ATHERECTOMY N/A 07/01/2021   Procedure: CORONARY ATHERECTOMY;  Surgeon: Kathleene Hazel, MD;  [facility];  Service: Cardiovascular;  Laterality: N/A;   CORONARY PRESSURE/FFR STUDY N/A 06/18/2021   Procedure: INTRAVASCULAR PRESSURE WIRE/FFR STUDY;  Surgeon: Kathleene Hazel, MD;  [facility];  Service: Cardiovascular;  Laterality: N/A;   CORONARY STENT INTERVENTION N/A 07/01/2021   Procedure:  CORONARY STENT INTERVENTION;  Surgeon: Kathleene Hazel, MD;  [facility];  Service: Cardiovascular;  Laterality: N/A;   CORONARY ULTRASOUND/IVUS N/A 07/01/2021   Procedure: Intravascular Ultrasound/IVUS;  Surgeon: Kathleene Hazel, MD;  [facility];  Service: Cardiovascular;  Laterality: N/A;   HEMORRHOID SURGERY N/A 01/13/2016   Procedure: HEMORRHOIDECTOMY AND REMOVAL OF ANAL SKIN TAG;  Surgeon: Abigail Miyamoto, MD;  [facility];  Service: General;  Laterality: N/A;   REPLACEMENT TOTAL KNEE Left 2005   left   RIGHT/LEFT HEART CATH AND CORONARY ANGIOGRAPHY N/A 06/18/2021   Procedure:  RIGHT/LEFT HEART CATH AND CORONARY ANGIOGRAPHY;  Surgeon: Kathleene Hazel, MD;  [facility];  Service: Cardiovascular;  Laterality: N/A;   SYNOVIAL CYST EXCISION     lumbar spine   TONSILLECTOMY     TOTAL KNEE ARTHROPLASTY Right 01/06/2017   Procedure: RIGHT TOTAL KNEE ARTHROPLASTY;  Surgeon: Eugenia Mcalpine, MD;  [facility];  Service: Orthopedics;  Laterality: Right;  Adductior Block   Social History   Socioeconomic History   Marital status: Married    Spouse name: Joyce Gross   Number of children: 4   Years of education: 12   Highest education level: Not on file  Occupational History   Occupation: Retired    Associate Professor: RETIRED  Tobacco Use   Smoking status: Never   Smokeless tobacco: Never  Vaping Use   Vaping Use: Never used  Substance and Sexual Activity   Alcohol use: No   Drug use: No   Sexual activity: Not on file  Other Topics Concern   Not on file  Social History Narrative   Patient lives at home with Wife Joyce Gross.    Patient has 4 children.    Patient is currently not working. He is retired and disabled.    Patient has a high school education.    Social Determinants of Health   Financial Resource Strain: Not on file  Food Insecurity: Not on file  Transportation Needs: Not on file  Physical Activity: Not on file  Stress: Not on file  Social Connections: Not on file   Allergies  Allergen Reactions   Alfuzosin Other (See Comments)    Felt winded/faint   Cymbalta [Duloxetine Hcl]     "Nausea, dizzy, sick"   Gabapentin Nausea Only   Pregabalin Nausea Only and Other (See Comments)    Felt poorly   Tamsulosin Hcl Other (See Comments)    Orthostatic hypotension   Contrast Media [Iodinated Contrast Media] Rash    Cath dye 06/18/21 rash started next day   Niacin Diarrhea   Family History  Problem Relation Age of Onset   Coronary artery disease Mother    Coronary artery disease Father    Colon cancer Neg Hx    Stomach cancer Neg Hx       Current Outpatient Medications (Cardiovascular):    amLODipine (NORVASC) 5 MG tablet, Take 1 tablet (5 mg total) by mouth 2 (two) times daily.   benazepril (LOTENSIN) 20 MG tablet, TAKE 1 TABLET BY MOUTH TWICE DAILY   hydrochlorothiazide (,MICROZIDE/HYDRODIURIL,) 12.5 MG capsule, Take 12.5 mg by mouth daily with lunch.    metoprolol tartrate (LOPRESSOR) 25 MG tablet, Take 25 mg by mouth daily as needed (afib.).    nitroGLYCERIN (NITROSTAT) 0.4 MG SL tablet, Place 1 tablet (0.4 mg total) under the tongue every 5 (five) minutes as needed.   rosuvastatin (CRESTOR) 10 MG tablet, Take 1 tablet (10 mg total)  by mouth daily.   tadalafil (CIALIS) 20 MG tablet, Take 1 tablet (20 mg total) by mouth daily as needed.   Current Outpatient Medications (Analgesics):    aspirin EC 81 MG tablet, Take 81 mg by mouth every evening. Swallow whole.   celecoxib (CELEBREX) 200 MG capsule, Take 200 mg by mouth as directed. As needed for pain   celecoxib (CELEBREX) 200 MG capsule, Take 1 capsule (200 mg total) by mouth daily as needed.   [START ON 05/11/2023] oxyCODONE (OXY IR/ROXICODONE) 5 MG immediate release tablet, Take 1 tablet (5 mg total) by mouth every 6 (six) hours as needed.   [START ON 04/12/2023] oxyCODONE (OXY IR/ROXICODONE) 5 MG immediate release tablet, Take 1 tablet (5 mg total) by mouth every 6 (six) hours as needed.   oxyCODONE (OXY IR/ROXICODONE) 5 MG immediate release tablet, Take 1 tablet (5 mg total) by mouth every 6 (six) hours as needed.   oxyCODONE (ROXICODONE) 5 MG immediate release tablet, Take 1-2 tablets (5-10 mg total) by mouth every 4 (four) hours as needed for severe pain or breakthrough pain.  Current Outpatient Medications (Hematological):    clopidogrel (PLAVIX) 75 MG tablet, Take 1 tablet (75 mg total) by mouth daily.  Current Outpatient Medications (Other):    B Complex-C (B-COMPLEX WITH VITAMIN C) tablet, Take 1 tablet by mouth daily with lunch.   Cholecalciferol (VITAMIN  D3) 50 MCG (2000 UT) TABS, Take 2,000 Units by mouth daily with lunch. B complex, vitamin c 500   Glucosamine-Chondroitin (COSAMIN DS PO), Take 1 tablet by mouth every evening.   methocarbamol (ROBAXIN) 500 MG tablet, Take 500 mg by mouth every 8 (eight) hours as needed for muscle spasms.   methocarbamol (ROBAXIN) 500 MG tablet, Take 1 tablet (500 mg total) by mouth every 6 (six) hours as needed for spasms.   methocarbamol (ROBAXIN) 500 MG tablet, Take 1 tablet (500 mg total) by mouth every 6 (six) hours as needed for muscle spasm   Multiple Vitamin (MULTIVITAMIN WITH MINERALS) TABS tablet, Take 1 tablet by mouth daily with lunch. Adult 50+   scopolamine (TRANSDERM-SCOP) 1 MG/3DAYS, Place 1 patch onto the skin every three (3) days as needed (nausea).   vitamin C (ASCORBIC ACID) 500 MG tablet, Take 500 mg by mouth daily with lunch.   Zinc 50 MG TABS, Take 50 mg by mouth every evening.    Review of Systems:  No headache, visual changes, nausea, vomiting, diarrhea, constipation, dizziness, abdominal pain, skin rash, fevers, chills, night sweats, weight loss, swollen lymph nodes, body aches, joint swelling, chest pain, shortness of breath, mood changes. POSITIVE muscle aches but patient states that it is stable  Objective  Blood pressure (!) 140/62, pulse 64, height  (1.803 m), weight 251 lb (113.9 kg), SpO2 97 %.   General: No apparent distress alert and oriented x3 mood and affect normal, dressed appropriately.  HEENT: Pupils equal, extraocular movements intact  Respiratory: Patient's speak in full sentences and does not appear short of breath  Cardiovascular: No lower extremity edema, non tender, no erythema  Sitting relatively comfortably in his chair.  Patient has 0 degrees of extension of the neck and only 2 degrees of extension of the back.  Significant tightness with straight leg test.  Neurovascular intact distally.    Impression and Recommendations:     The above documentation  has been reviewed and is accurate and complete Judi Saa, DO

## 2023-04-06 ENCOUNTER — Encounter: Payer: Self-pay | Admitting: Family Medicine

## 2023-04-06 ENCOUNTER — Ambulatory Visit (INDEPENDENT_AMBULATORY_CARE_PROVIDER_SITE_OTHER): Payer: Medicare Other | Admitting: Family Medicine

## 2023-04-06 VITALS — BP 140/62 | HR 64 | Ht 71.0 in | Wt 251.0 lb

## 2023-04-06 DIAGNOSIS — S32009K Unspecified fracture of unspecified lumbar vertebra, subsequent encounter for fracture with nonunion: Secondary | ICD-10-CM | POA: Diagnosis not present

## 2023-04-06 NOTE — Assessment & Plan Note (Addendum)
Discussed with patient again at great length.  Patient is seen in the neurosurgeon as well as a pain medicine specialist as well.  We discussed with patient that aquatic therapy could be good for him to decrease the amount of force on his joints.  Patient will consider doing this.  We also discussed the importance of weight loss.  Patient's BMI is over 35 and if we can get his weight down to 225 even I do think patient would feel significantly better.  We discussed different medications but he feels his oxycodone is still what is necessary and he gets this from his pain medicine specialist.  Patient is continuing to get injections in the lumbar spine by neurosurgery every 3 months.  Patient states he is due for that in the next weeks patient wants to hold on another recurrent physical therapy at the moment.  Will continue to do some lifting at the gym.  Follow-up again 3 months.  Total time discussed with patient 31 minutes.

## 2023-04-06 NOTE — Patient Instructions (Signed)
Great to see you  Overall doing well  Do more in the pool! Goal is to be down 12 pounds next visit See me again in 3 months

## 2023-04-25 DIAGNOSIS — D039 Melanoma in situ, unspecified: Secondary | ICD-10-CM | POA: Diagnosis not present

## 2023-04-25 DIAGNOSIS — I700 Atherosclerosis of aorta: Secondary | ICD-10-CM | POA: Diagnosis not present

## 2023-04-25 DIAGNOSIS — M5136 Other intervertebral disc degeneration, lumbar region: Secondary | ICD-10-CM | POA: Diagnosis not present

## 2023-04-25 DIAGNOSIS — I4892 Unspecified atrial flutter: Secondary | ICD-10-CM | POA: Diagnosis not present

## 2023-04-25 DIAGNOSIS — N411 Chronic prostatitis: Secondary | ICD-10-CM | POA: Diagnosis not present

## 2023-04-25 DIAGNOSIS — E538 Deficiency of other specified B group vitamins: Secondary | ICD-10-CM | POA: Diagnosis not present

## 2023-04-25 DIAGNOSIS — I10 Essential (primary) hypertension: Secondary | ICD-10-CM | POA: Diagnosis not present

## 2023-04-25 DIAGNOSIS — Z0000 Encounter for general adult medical examination without abnormal findings: Secondary | ICD-10-CM | POA: Diagnosis not present

## 2023-04-25 DIAGNOSIS — I2510 Atherosclerotic heart disease of native coronary artery without angina pectoris: Secondary | ICD-10-CM | POA: Diagnosis not present

## 2023-04-25 DIAGNOSIS — R7303 Prediabetes: Secondary | ICD-10-CM | POA: Diagnosis not present

## 2023-04-25 DIAGNOSIS — Z1389 Encounter for screening for other disorder: Secondary | ICD-10-CM | POA: Diagnosis not present

## 2023-04-25 DIAGNOSIS — E782 Mixed hyperlipidemia: Secondary | ICD-10-CM | POA: Diagnosis not present

## 2023-04-26 ENCOUNTER — Other Ambulatory Visit (HOSPITAL_BASED_OUTPATIENT_CLINIC_OR_DEPARTMENT_OTHER): Payer: Self-pay

## 2023-04-26 ENCOUNTER — Other Ambulatory Visit: Payer: Self-pay

## 2023-05-04 DIAGNOSIS — M5416 Radiculopathy, lumbar region: Secondary | ICD-10-CM | POA: Diagnosis not present

## 2023-05-04 DIAGNOSIS — M5116 Intervertebral disc disorders with radiculopathy, lumbar region: Secondary | ICD-10-CM | POA: Diagnosis not present

## 2023-05-18 DIAGNOSIS — C4449 Other specified malignant neoplasm of skin of scalp and neck: Secondary | ICD-10-CM | POA: Diagnosis not present

## 2023-05-18 DIAGNOSIS — R229 Localized swelling, mass and lump, unspecified: Secondary | ICD-10-CM | POA: Diagnosis not present

## 2023-05-18 DIAGNOSIS — L814 Other melanin hyperpigmentation: Secondary | ICD-10-CM | POA: Diagnosis not present

## 2023-05-18 DIAGNOSIS — Z86006 Personal history of melanoma in-situ: Secondary | ICD-10-CM | POA: Diagnosis not present

## 2023-05-18 DIAGNOSIS — D225 Melanocytic nevi of trunk: Secondary | ICD-10-CM | POA: Diagnosis not present

## 2023-05-18 DIAGNOSIS — L570 Actinic keratosis: Secondary | ICD-10-CM | POA: Diagnosis not present

## 2023-05-18 DIAGNOSIS — C434 Malignant melanoma of scalp and neck: Secondary | ICD-10-CM | POA: Diagnosis not present

## 2023-05-18 DIAGNOSIS — L821 Other seborrheic keratosis: Secondary | ICD-10-CM | POA: Diagnosis not present

## 2023-05-18 DIAGNOSIS — Z08 Encounter for follow-up examination after completed treatment for malignant neoplasm: Secondary | ICD-10-CM | POA: Diagnosis not present

## 2023-05-30 ENCOUNTER — Other Ambulatory Visit (HOSPITAL_BASED_OUTPATIENT_CLINIC_OR_DEPARTMENT_OTHER): Payer: Self-pay

## 2023-06-09 DIAGNOSIS — Z1212 Encounter for screening for malignant neoplasm of rectum: Secondary | ICD-10-CM | POA: Diagnosis not present

## 2023-06-09 DIAGNOSIS — Z1211 Encounter for screening for malignant neoplasm of colon: Secondary | ICD-10-CM | POA: Diagnosis not present

## 2023-06-19 NOTE — Progress Notes (Signed)
Cardiology Office Note:    Date:  06/20/2023  ID:  [name], DOB [birth year], MRN [id number] PCP: Tony Housekeeper, MD  [facility] Providers Cardiologist:  Tony Pates, MD       Patient Profile:      Paroxysmal AFlutter TTE 06/15/16: EF 55-60, no RWMA, normal diastolic function, trivial MR, normal RVSF, PASP 32  TTE 09/02/2022: EF 55-60, no RWMA, GR 1 DD, normal RVSF, trivial MR, AV sclerosis, RAP 8  Coronary artery disease  Myoview 10/31/17: EF 50 no ischemia  CCTA 06/14/2021: CAC score 1948 (88%), Heavy multivessel disease coronary calcification 07/01/2021: s/p orbital atherectomy, 2.75 x 12 mm, 2.5 x 15 mm and 2.25 x 8 mm DES to the prox-mid LAD LHC 06/18/2021: Ostial RCA 50, mid RCA 40, distal RCA 30, proximal LAD 80, mid LAD 80 Hypertension  Hyperlipidemia  PACs/PVCs Monitor 05/2016: PVC burden 8.7% S/p lumbar fusion 11/19       History of Present Illness:   [name] is a 77 y.o. male who returns for follow-up of CAD, atrial flutter.  He was last seen by Dr. Tenny Craw in November 2023.  He is here alone.  He is overall doing well.  He has been exercising every week.  He spends about 25 minutes on the elliptical.  He can go 1 mile and 13 minutes.  He has difficulty with his back and has trouble walking.  He has been trying to walk in the pool for exercise as well.  He has not had chest pain, shortness of breath, syncope, orthopnea, leg edema.  He does note significant bruising and would like to come off of Plavix.  Review of Systems  Hematologic/Lymphatic: Bruises/bleeds easily.   See HPI    Studies Reviewed:       Risk Assessment/Calculations:     HYPERTENSION CONTROL Vitals:   06/20/23 1005 06/20/23 1326  BP: (!) 142/60 (!) 142/70    The patient's blood pressure is elevated above target today.  In order to address the patient's elevated BP:        Physical Exam:   VS:  BP (!) 142/70   Pulse 68   Ht 5\' 11"  (1.803 m)   Wt 243 lb 3.2 oz (110.3 kg)   SpO2 96%    BMI 33.92 kg/m    Wt Readings from Last 3 Encounters:  06/20/23 243 lb 3.2 oz (110.3 kg)  04/06/23 251 lb (113.9 kg)  01/05/23 253 lb (114.8 kg)    Constitutional:      Appearance: Healthy appearance. Not in distress.  Neck:     Vascular: No carotid bruit. JVD normal.  Pulmonary:     Breath sounds: Normal breath sounds. No wheezing. No rales.  Cardiovascular:     Normal rate. Irregular rhythm.     Murmurs: There is no murmur.  Edema:    Peripheral edema absent.  Abdominal:     Palpations: Abdomen is soft.      ASSESSMENT AND PLAN:   CAD (coronary artery disease) Status post DES x 3 to the LAD in July 2022.  He had moderate nonobstructive disease in the RCA.  He is doing well without angina.  He is very active and exercises on a regular basis.  He does have significant bruising with Plavix.  He would like to switch to aspirin which seems reasonable as he is 2 years out from his PCI.  I will review further with Dr. Clifton James who did his PCI as well as Dr.  Ross to see if we could change him from Plavix to aspirin monotherapy.  For now, continue Plavix 75 mg daily, Crestor 10 mg daily.  Follow-up 6 months.  ATRIAL FLUTTER, PAROXYSMAL No apparent recurrence.  He does have a history of PACs.  This was evident on exam today.  He takes metoprolol tartrate 25 mg as needed.  Continue current therapy.  Essential hypertension Blood pressure somewhat above target today.  He notes better blood pressures at home (130s/60s).  Continue amlodipine 5 mg twice daily, benazepril 20 mg twice daily, HCTZ 12.5 mg daily, metoprolol as needed.  Monitor blood pressure at home.  If BP continues to increase, consider increasing HCTZ versus adding hydralazine.  Pure hypercholesterolemia Goal LDL is <55.  Labs from primary care reviewed.  LDL in May 2024 was at goal at 49.  Continue Crestor 10 mg daily.    Dispo:  Return in about 6 months (around 12/21/2023) for Routine Follow Up, w/ Dr. Tenny Craw.  Signed, Tereso Newcomer, PA-C

## 2023-06-20 ENCOUNTER — Ambulatory Visit: Payer: Medicare Other | Attending: Physician Assistant | Admitting: Physician Assistant

## 2023-06-20 ENCOUNTER — Encounter: Payer: Self-pay | Admitting: Physician Assistant

## 2023-06-20 ENCOUNTER — Other Ambulatory Visit (HOSPITAL_BASED_OUTPATIENT_CLINIC_OR_DEPARTMENT_OTHER): Payer: Self-pay

## 2023-06-20 VITALS — BP 142/70 | HR 68 | Ht 71.0 in | Wt 243.2 lb

## 2023-06-20 DIAGNOSIS — I4892 Unspecified atrial flutter: Secondary | ICD-10-CM | POA: Diagnosis not present

## 2023-06-20 DIAGNOSIS — I2510 Atherosclerotic heart disease of native coronary artery without angina pectoris: Secondary | ICD-10-CM | POA: Insufficient documentation

## 2023-06-20 DIAGNOSIS — E7800 Pure hypercholesterolemia, unspecified: Secondary | ICD-10-CM | POA: Diagnosis not present

## 2023-06-20 DIAGNOSIS — I10 Essential (primary) hypertension: Secondary | ICD-10-CM | POA: Diagnosis not present

## 2023-06-20 MED ORDER — CELECOXIB 200 MG PO CAPS
200.0000 mg | ORAL_CAPSULE | Freq: Every day | ORAL | 11 refills | Status: AC | PRN
  Filled 2023-06-20: qty 30, 30d supply, fill #0
  Filled 2023-10-11: qty 30, 30d supply, fill #1
  Filled 2024-01-12 (×2): qty 30, 30d supply, fill #2
  Filled 2024-03-21: qty 30, 30d supply, fill #3

## 2023-06-20 NOTE — Assessment & Plan Note (Signed)
Status post DES x 3 to the LAD in July 2022.  He had moderate nonobstructive disease in the RCA.  He is doing well without angina.  He is very active and exercises on a regular basis.  He does have significant bruising with Plavix.  He would like to switch to aspirin which seems reasonable as he is 2 years out from his PCI.  I will review further with Dr. Clifton James who did his PCI as well as Dr. Tenny Craw to see if we could change him from Plavix to aspirin monotherapy.  For now, continue Plavix 75 mg daily, Crestor 10 mg daily.  Follow-up 6 months.

## 2023-06-20 NOTE — Assessment & Plan Note (Signed)
No apparent recurrence.  He does have a history of PACs.  This was evident on exam today.  He takes metoprolol tartrate 25 mg as needed.  Continue current therapy.

## 2023-06-20 NOTE — Assessment & Plan Note (Signed)
Blood pressure somewhat above target today.  He notes better blood pressures at home (130s/60s).  Continue amlodipine 5 mg twice daily, benazepril 20 mg twice daily, HCTZ 12.5 mg daily, metoprolol as needed.  Monitor blood pressure at home.  If BP continues to increase, consider increasing HCTZ versus adding hydralazine.

## 2023-06-20 NOTE — Patient Instructions (Signed)
Medication Instructions:  Your physician recommends that you continue on your current medications as directed. Please refer to the Current Medication list given to you today.  *If you need a refill on your cardiac medications before your next appointment, please call your pharmacy*   Lab Work: None  If you have labs (blood work) drawn today and your tests are completely normal, you will receive your results only by: MyChart Message (if you have MyChart) OR A paper copy in the mail If you have any lab test that is abnormal or we need to change your treatment, we will call you to review the results.   Testing/Procedures: None   Follow-Up: At [facility], you and your health needs are our priority.  As part of our continuing mission to provide you with exceptional heart care, we have created designated Provider Care Teams.  These Care Teams include your primary Cardiologist (physician) and Advanced Practice Providers (APPs -  Physician Assistants and Nurse Practitioners) who all work together to provide you with the care you need, when you need it.  We recommend signing up for the patient portal called "MyChart".  Sign up information is provided on this After Visit Summary.  MyChart is used to connect with patients for Virtual Visits (Telemedicine).  Patients are able to view lab/test results, encounter notes, upcoming appointments, etc.  Non-urgent messages can be sent to your provider as well.   To learn more about what you can do with MyChart, go to [email or web address].    Your next appointment:   6 month(s)  Provider:   Dietrich Pates, MD

## 2023-06-20 NOTE — Assessment & Plan Note (Signed)
Goal LDL is <55.  Labs from primary care reviewed.  LDL in May 2024 was at goal at 49.  Continue Crestor 10 mg daily.

## 2023-06-21 ENCOUNTER — Other Ambulatory Visit (HOSPITAL_BASED_OUTPATIENT_CLINIC_OR_DEPARTMENT_OTHER): Payer: Self-pay

## 2023-06-25 ENCOUNTER — Other Ambulatory Visit: Payer: Self-pay | Admitting: Physician Assistant

## 2023-06-25 MED ORDER — ASPIRIN 81 MG PO TBEC: 81.0000 mg | DELAYED_RELEASE_TABLET | Freq: Every day | ORAL | 3 refills | Status: AC

## 2023-07-05 NOTE — Progress Notes (Signed)
[name] [address] [place] [address] Phone: [phone] Subjective:   [name], am serving as a scribe for Dr. Antoine Primas.  I'm seeing this patient by the request  of:  Georgann Housekeeper, MD  CC: Back pain and pain all over  [id number]  04/06/2023 Discussed with patient again at great length.  Patient is seen in the neurosurgeon as well as a pain medicine specialist as well.  We discussed with patient that aquatic therapy could be good for him to decrease the amount of force on his joints.  Patient will consider doing this.  We also discussed the importance of weight loss.  Patient's BMI is over 35 and if we can get his weight down to 225 even I do think patient would feel significantly better.  We discussed different medications but he feels his oxycodone is still what is necessary and he gets this from his pain medicine specialist.  Patient is continuing to get injections in the lumbar spine by neurosurgery every 3 months.  Patient states he is due for that in the next weeks patient wants to hold on another recurrent physical therapy at the moment.  Will continue to do some lifting at the gym.  Follow-up again 3 months.  Total time discussed with patient 31 minutes.      Update 07/06/2023 [name] is a 77 y.o. male coming in with complaint of lumbar spine pain. Patient states same pain as usual. Doing exercises and walking laps in the pool. Not seeing any progress. Does elliptical for a good amount of time. Has to use shopping cart when in stores and walking sticks when attempting to go long distances.     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   Paroxysmal atrial flutter (HCC)    Past Surgical History:  Procedure Laterality Date   ABDOMINAL EXPOSURE N/A 01/06/2020   Procedure: ABDOMINAL EXPOSURE;  Surgeon: Larina Earthly, MD;  Location: [facility] OR;  Service: Vascular;  Laterality: N/A;   ANTERIOR LUMBAR FUSION N/A 01/06/2020   Procedure: Lumbar Five- Sacral One  Anterior lumbar interbody fusion with infuse;  Surgeon: Barnett Abu, MD;  Location: [facility] OR;  Service: Neurosurgery;  Laterality: N/A;  Lumbar Five- Sacral One  Anterior lumbar interbody fusion with infuse   CERVICAL FUSION  1996, 1997   2 surgery- skull to C7   COLON RESECTION  2007   COLONOSCOPY W/ POLYPECTOMY     CORONARY ATHERECTOMY N/A 07/01/2021   Procedure: CORONARY ATHERECTOMY;  Surgeon: Kathleene Hazel, MD;  Location: [facility] INVASIVE [facility];  Service: Cardiovascular;  Laterality: N/A;   CORONARY PRESSURE/FFR STUDY N/A 06/18/2021   Procedure: INTRAVASCULAR PRESSURE WIRE/FFR STUDY;  Surgeon: Kathleene Hazel, MD;  Location: [facility];  Service: Cardiovascular;  Laterality: N/A;   CORONARY STENT INTERVENTION N/A 07/01/2021   Procedure: CORONARY STENT INTERVENTION;  Surgeon: Kathleene Hazel, MD;  Location: [facility];  Service: Cardiovascular;  Laterality: N/A;  CORONARY ULTRASOUND/IVUS N/A 07/01/2021   Procedure: Intravascular Ultrasound/IVUS;  Surgeon: Kathleene Hazel, MD;  Location: [facility];  Service: Cardiovascular;  Laterality: N/A;   HEMORRHOID SURGERY N/A 01/13/2016   Procedure: HEMORRHOIDECTOMY AND REMOVAL OF ANAL SKIN TAG;  Surgeon: Abigail Miyamoto, MD;  Location: [facility] OR;  Service: General;  Laterality: N/A;   REPLACEMENT TOTAL KNEE Left 2005   left   RIGHT/LEFT HEART CATH AND CORONARY ANGIOGRAPHY N/A 06/18/2021   Procedure: RIGHT/LEFT HEART CATH AND CORONARY ANGIOGRAPHY;  Surgeon:  Kathleene Hazel, MD;  Location: [facility];  Service: Cardiovascular;  Laterality: N/A;   SYNOVIAL CYST EXCISION     lumbar spine   TONSILLECTOMY     TOTAL KNEE ARTHROPLASTY Right 01/06/2017   Procedure: RIGHT TOTAL KNEE ARTHROPLASTY;  Surgeon: Eugenia Mcalpine, MD;  Location: [facility];  Service: Orthopedics;  Laterality: Right;  Adductior Block   Social History   Socioeconomic History   Marital status: Married    Spouse name: Joyce Gross   Number of children: 4   Years of education: 12   Highest education level: Not on file  Occupational History   Occupation: Retired    Associate Professor: RETIRED  Tobacco Use   Smoking status: Never   Smokeless tobacco: Never  Vaping Use   Vaping status: Never Used  Substance and Sexual Activity   Alcohol use: No   Drug use: No   Sexual activity: Not on file  Other Topics Concern   Not on file  Social History Narrative   Patient lives at home with Wife Joyce Gross.    Patient has 4 children.    Patient is currently not working. He is retired and disabled.    Patient has a high school education.    Social Determinants of Health   Financial Resource Strain: Not on file  Food Insecurity: Not on file  Transportation Needs: Not on file  Physical Activity: Not on file  Stress: Not on file  Social Connections: Unknown (05/02/2022)   [facility]   Social Network    Social Network: Not on file   Allergies  Allergen Reactions   Alfuzosin Other (See Comments) and Swelling    Felt winded/faint   Duloxetine Hcl Other (See Comments) and Swelling    "Nausea, dizzy, sick"  Other Reaction(s): Dizziness   Gabapentin Nausea Only and Nausea And Vomiting    Other Reaction(s): dizzy   Pregabalin Nausea Only, Other (See Comments), Nausea And Vomiting and Itching    Felt poorly  Other Reaction(s): dizzy   Tamsulosin Hcl Other (See Comments), Nausea Only and Swelling    Orthostatic hypotension   Contrast Media [Iodinated Contrast Media] Rash     Cath dye 06/18/21 rash started next day   Niacin Diarrhea   Family History  Problem Relation Age of Onset   Coronary artery disease Mother    Coronary artery disease Father    Colon cancer Neg Hx    Stomach cancer Neg Hx      Current Outpatient Medications (Cardiovascular):    amLODipine (NORVASC) 5 MG tablet, Take 1 tablet (5 mg total) by mouth 2 (two) times daily.   benazepril (LOTENSIN) 20 MG tablet, TAKE 1 TABLET BY MOUTH TWICE DAILY   hydrochlorothiazide (,MICROZIDE/HYDRODIURIL,) 12.5 MG capsule, Take 12.5 mg by mouth daily with lunch.    metoprolol tartrate (LOPRESSOR) 25 MG tablet, Take 25 mg by mouth daily as needed (afib.).    nitroGLYCERIN (NITROSTAT) 0.4 MG SL tablet, Place 1 tablet (  0.4 mg total) under the tongue every 5 (five) minutes as needed.   rosuvastatin (CRESTOR) 10 MG tablet, Take 1 tablet (10 mg total) by mouth daily.   tadalafil (CIALIS) 20 MG tablet, Take 1 tablet (20 mg total) by mouth daily as needed.   Current Outpatient Medications (Analgesics):    aspirin EC 81 MG tablet, Take 1 tablet (81 mg total) by mouth daily. Swallow whole.   celecoxib (CELEBREX) 200 MG capsule, Take 200 mg by mouth as directed. As needed for pain   celecoxib (CELEBREX) 200 MG capsule, Take 1 capsule (200 mg total) by mouth daily as needed.   oxyCODONE (ROXICODONE) 5 MG immediate release tablet, Take 1-2 tablets (5-10 mg total) by mouth every 4 (four) hours as needed for severe pain or breakthrough pain.   Current Outpatient Medications (Other):    B Complex-C (B-COMPLEX WITH VITAMIN C) tablet, Take 1 tablet by mouth daily with lunch.   Cholecalciferol (VITAMIN D3) 50 MCG (2000 UT) TABS, Take 2,000 Units by mouth daily with lunch. B complex, vitamin c 500   Glucosamine-Chondroitin (COSAMIN DS PO), Take 1 tablet by mouth every evening.   methocarbamol (ROBAXIN) 500 MG tablet, Take 500 mg by mouth every 8 (eight) hours as needed for muscle spasms.   Multiple Vitamin (MULTIVITAMIN  WITH MINERALS) TABS tablet, Take 1 tablet by mouth daily with lunch. Adult 50+   scopolamine (TRANSDERM-SCOP) 1 MG/3DAYS, Place 1 patch onto the skin every three (3) days as needed (nausea).   vitamin C (ASCORBIC ACID) 500 MG tablet, Take 500 mg by mouth daily with lunch.   Reviewed prior external information including notes and imaging from  primary care provider As well as notes that were available from care everywhere and other healthcare systems.  Past medical history, social, surgical and family history all reviewed in electronic medical record.  No pertanent information unless stated regarding to the chief complaint.   Review of Systems:  No headache, visual changes, nausea, vomiting, diarrhea, constipation, dizziness, abdominal pain, skin rash, fevers, chills, night sweats, weight loss, swollen lymph nodes,  joint swelling, chest pain, shortness of breath, mood changes. POSITIVE muscle aches, body aches, fatigue  Objective  Blood pressure 132/60, pulse 77, height 5\' 11"  (1.803 m), weight 242 lb (109.8 kg), SpO2 96%.   General: No apparent distress alert and oriented x3 mood and affect normal, dressed appropriately.  HEENT: Pupils equal, extraocular movements intact  Respiratory: Patient's speak in full sentences and does not appear short of breath  Cardiovascular: No lower extremity edema, non tender, no erythema  Patient is sitting but it does seem to be uncomfortable.  Has no range of motion of the neck at all.  Patient's low back does make it difficult for him to stand up.  Lower extremities no neurovascular intact. Bilateral knee replacements noted.    Impression and Recommendations:    The above documentation has been reviewed and is accurate and complete Judi Saa, DO

## 2023-07-06 ENCOUNTER — Encounter: Payer: Self-pay | Admitting: Family Medicine

## 2023-07-06 ENCOUNTER — Ambulatory Visit: Payer: Medicare Other | Admitting: Family Medicine

## 2023-07-06 VITALS — BP 132/60 | HR 77 | Ht 71.0 in | Wt 242.0 lb

## 2023-07-06 DIAGNOSIS — S32009K Unspecified fracture of unspecified lumbar vertebra, subsequent encounter for fracture with nonunion: Secondary | ICD-10-CM | POA: Diagnosis not present

## 2023-07-06 NOTE — Patient Instructions (Signed)
Good to see you! Take celebrex 2x a day when you take it Would recommend another day in pool If you want we can check in in 3-4 months

## 2023-07-06 NOTE — Assessment & Plan Note (Signed)
Patient is still complaining of the same symptomatology at this point.  We discussed again about different treatment options including increasing his aquatic therapy which patient does state seems to help him in every facet of life.  Patient states that that transportation is concerning on left.  Patient is walking with the aid of a shopping cart in different stores as other treatment.  Patient is seen in neurosurgery and is having intermittent spinal injections done but they do seem to be far less beneficial than what he is having recently.  We discussed different medications which she was trying to avoid some but is continuing to take the oxycodone and warned of the potential harm as well as tolerance.  We discussed with patient about Celebrex and using the more of a burst being as needed dosing.  Discussed with patient can come back again in 3 to 4 months if he would like to discuss otherwise.  Total time with patient 31 minutes

## 2023-07-10 ENCOUNTER — Other Ambulatory Visit: Payer: Self-pay | Admitting: Internal Medicine

## 2023-08-02 ENCOUNTER — Other Ambulatory Visit (HOSPITAL_BASED_OUTPATIENT_CLINIC_OR_DEPARTMENT_OTHER): Payer: Self-pay

## 2023-08-02 MED ORDER — OXYCODONE HCL 5 MG PO TABS
5.0000 mg | ORAL_TABLET | Freq: Four times a day (QID) | ORAL | 0 refills | Status: DC | PRN
  Filled 2023-08-02: qty 120, 30d supply, fill #0

## 2023-08-03 DIAGNOSIS — H6120 Impacted cerumen, unspecified ear: Secondary | ICD-10-CM | POA: Diagnosis not present

## 2023-08-04 DIAGNOSIS — H6121 Impacted cerumen, right ear: Secondary | ICD-10-CM | POA: Diagnosis not present

## 2023-08-04 DIAGNOSIS — R0982 Postnasal drip: Secondary | ICD-10-CM | POA: Diagnosis not present

## 2023-08-05 ENCOUNTER — Other Ambulatory Visit (HOSPITAL_BASED_OUTPATIENT_CLINIC_OR_DEPARTMENT_OTHER): Payer: Self-pay

## 2023-08-15 ENCOUNTER — Telehealth: Payer: Self-pay | Admitting: Internal Medicine

## 2023-08-15 DIAGNOSIS — I491 Atrial premature depolarization: Secondary | ICD-10-CM

## 2023-08-15 DIAGNOSIS — I2510 Atherosclerotic heart disease of native coronary artery without angina pectoris: Secondary | ICD-10-CM

## 2023-08-15 DIAGNOSIS — I10 Essential (primary) hypertension: Secondary | ICD-10-CM

## 2023-08-15 DIAGNOSIS — I4892 Unspecified atrial flutter: Secondary | ICD-10-CM

## 2023-08-15 NOTE — Telephone Encounter (Signed)
Pt c/o BP issue: STAT if pt c/o blurred vision, one-sided weakness or slurred speech  1. What are your last 5 BP readings? 177/72 HR 62   2. Are you having any other symptoms (ex. Dizziness, headache, blurred vision, passed out)? Dizziness and headaches  3. What is your BP issue? Patient states his BP has been gradually going up for the past several weeks.

## 2023-08-15 NOTE — Telephone Encounter (Signed)
Patient states last week he noticed his BP has been elevated mostly SBP in the 170's.States heart rate has been normal.  He does not have any readings just range. Last  BP 146/76 He sometimes be dizzy and have headaches. He feels his medication is not helping. He is currently asymptomatic. He states he is hydrated and his diet has not changed. He will continue to monitor BP. Discussed ED precautions. Will forward to provider.

## 2023-08-17 NOTE — Telephone Encounter (Signed)
Recheck CBC and BMET before making any adjustments in meds

## 2023-08-22 ENCOUNTER — Telehealth: Payer: Self-pay

## 2023-08-22 ENCOUNTER — Other Ambulatory Visit (HOSPITAL_BASED_OUTPATIENT_CLINIC_OR_DEPARTMENT_OTHER): Payer: Self-pay

## 2023-08-22 ENCOUNTER — Other Ambulatory Visit: Payer: Self-pay

## 2023-08-22 DIAGNOSIS — M545 Low back pain, unspecified: Secondary | ICD-10-CM

## 2023-08-22 NOTE — Telephone Encounter (Signed)
Patient called states that he would like to get some more PT at [facility] with david caldwell, for his back.

## 2023-08-23 NOTE — Telephone Encounter (Signed)
PT ordered.

## 2023-08-23 NOTE — Telephone Encounter (Signed)
Patient notified through My Chart

## 2023-08-23 NOTE — Telephone Encounter (Signed)
I spoke with the pt and he will have labs this Friday... he says his BP has improved some he did not have readings in front of him.. he says he thinks it was the steroidal nasal spray given to him by the ENT that he has stopped.  BP has bene around 130/602.

## 2023-08-24 DIAGNOSIS — M5416 Radiculopathy, lumbar region: Secondary | ICD-10-CM | POA: Diagnosis not present

## 2023-08-24 DIAGNOSIS — M48061 Spinal stenosis, lumbar region without neurogenic claudication: Secondary | ICD-10-CM | POA: Diagnosis not present

## 2023-08-24 DIAGNOSIS — M5116 Intervertebral disc disorders with radiculopathy, lumbar region: Secondary | ICD-10-CM | POA: Diagnosis not present

## 2023-08-25 ENCOUNTER — Ambulatory Visit: Payer: Medicare Other | Attending: Internal Medicine

## 2023-08-25 DIAGNOSIS — I2510 Atherosclerotic heart disease of native coronary artery without angina pectoris: Secondary | ICD-10-CM | POA: Diagnosis not present

## 2023-08-25 DIAGNOSIS — I491 Atrial premature depolarization: Secondary | ICD-10-CM | POA: Diagnosis not present

## 2023-08-25 DIAGNOSIS — I10 Essential (primary) hypertension: Secondary | ICD-10-CM

## 2023-08-25 DIAGNOSIS — I4892 Unspecified atrial flutter: Secondary | ICD-10-CM

## 2023-08-26 LAB — CBC
Hematocrit: 38.3 % (ref 37.5–51.0)
Hemoglobin: 12.8 g/dL — ABNORMAL LOW (ref 13.0–17.7)
MCH: 33.1 pg — ABNORMAL HIGH (ref 26.6–33.0)
MCHC: 33.4 g/dL (ref 31.5–35.7)
MCV: 99 fL — ABNORMAL HIGH (ref 79–97)
Platelets: 197 10*3/uL (ref 150–450)
RBC: 3.87 x10E6/uL — ABNORMAL LOW (ref 4.14–5.80)
RDW: 13.3 % (ref 11.6–15.4)
WBC: 15.1 10*3/uL — ABNORMAL HIGH (ref 3.4–10.8)

## 2023-08-26 LAB — BASIC METABOLIC PANEL
BUN/Creatinine Ratio: 24 (ref 10–24)
BUN: 28 mg/dL — ABNORMAL HIGH (ref 8–27)
CO2: 20 mmol/L (ref 20–29)
Calcium: 9.7 mg/dL (ref 8.6–10.2)
Chloride: 102 mmol/L (ref 96–106)
Creatinine, Ser: 1.15 mg/dL (ref 0.76–1.27)
Glucose: 120 mg/dL — ABNORMAL HIGH (ref 70–99)
Potassium: 4.2 mmol/L (ref 3.5–5.2)
Sodium: 138 mmol/L (ref 134–144)
eGFR: 65 mL/min/{1.73_m2} (ref >59)

## 2023-08-29 ENCOUNTER — Other Ambulatory Visit (HOSPITAL_BASED_OUTPATIENT_CLINIC_OR_DEPARTMENT_OTHER): Payer: Self-pay

## 2023-08-29 ENCOUNTER — Other Ambulatory Visit: Payer: Self-pay

## 2023-08-29 DIAGNOSIS — D72829 Elevated white blood cell count, unspecified: Secondary | ICD-10-CM

## 2023-08-31 ENCOUNTER — Other Ambulatory Visit: Payer: Self-pay

## 2023-08-31 ENCOUNTER — Encounter (HOSPITAL_BASED_OUTPATIENT_CLINIC_OR_DEPARTMENT_OTHER): Payer: Self-pay | Admitting: Physical Therapy

## 2023-08-31 ENCOUNTER — Ambulatory Visit (HOSPITAL_BASED_OUTPATIENT_CLINIC_OR_DEPARTMENT_OTHER): Payer: Medicare Other | Attending: Family Medicine | Admitting: Physical Therapy

## 2023-08-31 DIAGNOSIS — R2689 Other abnormalities of gait and mobility: Secondary | ICD-10-CM | POA: Insufficient documentation

## 2023-08-31 DIAGNOSIS — M542 Cervicalgia: Secondary | ICD-10-CM | POA: Diagnosis not present

## 2023-08-31 DIAGNOSIS — M545 Low back pain, unspecified: Secondary | ICD-10-CM | POA: Insufficient documentation

## 2023-08-31 DIAGNOSIS — G8929 Other chronic pain: Secondary | ICD-10-CM | POA: Insufficient documentation

## 2023-08-31 NOTE — Therapy (Signed)
OUTPATIENT PHYSICAL THERAPY THORACOLUMBAR EVALUATION   Patient Name: [name] MRN: [id number] DOB:1946/08/17, 77 y.o., male Today's Date: 09/01/2023  END OF SESSION:  PT End of Session - 08/31/23 1628     Visit Number 1    Number of Visits 16    Date for PT Re-Evaluation 10/26/23    PT Start Time 1430    PT Stop Time 1512    PT Time Calculation (min) 42 min    Activity Tolerance Patient tolerated treatment well    Behavior During Therapy [facility] for tasks assessed/performed             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   Paroxysmal atrial flutter (HCC)    Past Surgical History:  Procedure Laterality Date   ABDOMINAL EXPOSURE N/A 01/06/2020   Procedure: ABDOMINAL EXPOSURE;  Surgeon: Larina Earthly, MD;  Location: [facility];  Service: Vascular;  Laterality: N/A;   ANTERIOR LUMBAR FUSION N/A 01/06/2020   Procedure: Lumbar Five- Sacral One  Anterior lumbar interbody fusion with infuse;  Surgeon: Barnett Abu, MD;  Location: [facility];  Service: Neurosurgery;  Laterality: N/A;  Lumbar Five- Sacral One  Anterior lumbar interbody fusion with infuse   CERVICAL FUSION  1996, 1997   2 surgery- skull to C7   COLON RESECTION  2007   COLONOSCOPY W/ POLYPECTOMY     CORONARY ATHERECTOMY N/A 07/01/2021   Procedure: CORONARY ATHERECTOMY;  Surgeon: Kathleene Hazel, MD;  [facility]: [facility];  Service: Cardiovascular;  Laterality: N/A;   CORONARY PRESSURE/FFR STUDY N/A 06/18/2021   Procedure: INTRAVASCULAR PRESSURE WIRE/FFR STUDY;  Surgeon: Kathleene Hazel, MD;  [facility];  Service: Cardiovascular;  Laterality: N/A;   CORONARY STENT INTERVENTION N/A 07/01/2021   Procedure: CORONARY STENT INTERVENTION;  Surgeon: Kathleene Hazel, MD;  [facility];  Service: Cardiovascular;  Laterality: N/A;   CORONARY ULTRASOUND/IVUS N/A 07/01/2021   Procedure: Intravascular Ultrasound/IVUS;  Surgeon: Kathleene Hazel, MD;  [facility];  Service: Cardiovascular;  Laterality: N/A;   HEMORRHOID SURGERY N/A 01/13/2016   Procedure: HEMORRHOIDECTOMY AND REMOVAL OF ANAL SKIN TAG;  Surgeon: Abigail Miyamoto, MD;  Location: [facility];  Service: General;  Laterality: N/A;   REPLACEMENT TOTAL KNEE Left 2005   left   RIGHT/LEFT HEART CATH AND CORONARY ANGIOGRAPHY N/A 06/18/2021   Procedure: RIGHT/LEFT HEART CATH AND CORONARY ANGIOGRAPHY;  Surgeon: Kathleene Hazel, MD;  [facility]: [facility];  Service: Cardiovascular;  Laterality: N/A;   SYNOVIAL CYST EXCISION     lumbar spine   TONSILLECTOMY     TOTAL KNEE ARTHROPLASTY Right 01/06/2017   Procedure: RIGHT TOTAL KNEE ARTHROPLASTY;  Surgeon: Eugenia Mcalpine, MD;  Location: [facility];  Service: Orthopedics;  Laterality: Right;  Adductior Block   Patient Active Problem List   Diagnosis Date Noted   Pure hypercholesterolemia 06/20/2023   Degenerative cervical disc 07/07/2021   Coronary artery disease involving native coronary artery of native heart  with unstable angina pectoris (HCC)    CAD (coronary artery disease)    Pseudoarthrosis of lumbar spine 01/06/2020   Coronary artery calcification seen on CT scan 01/29/2019   Change in bowel habits 06/12/2018   Primary osteoarthritis of right knee 01/06/2017   S/P knee replacement 01/06/2017   Diplopia 07/10/2013   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   Sore throat 07/26/2011   DIZZINESS 09/10/2009   ATRIAL FLUTTER, PAROXYSMAL 07/09/2009   Dyslipidemia 07/07/2009   Essential hypertension 07/07/2009     PCP: Tyson Dense MD  REFERRING PROVIDER: Celine Mans  REFERRING DIAG:  Diagnosis  M54.50 (ICD-10-CM) - Lumbar spine pain    Rationale for Evaluation and Treatment: Rehabilitation  THERAPY DIAG:  Cervicalgia  Chronic bilateral low back pain without sciatica  Other abnormalities of gait and mobility  ONSET DATE: Greater than 20 years  SUBJECTIVE:                                                                                                                                                                                           SUBJECTIVE STATEMENT: Patient has a significant history of low back and neck pain.  He had a cervical fusion greater than 20 years ago stemming from a significant injury.  He has then had subsequent lower back surgeries over time.  At this time he has significant low back pain.  He feels like he is bending over forward more.  He feels like he cannot stand straight. He fells at this time like he can't walk more then 20 min.   PERTINENT HISTORY:  Multijoint arthritis, A-fib, multiple low back surgeries,  PAIN:  Are you having pain? Yes: NPRS scale: 4-8/10 depending on activity Pain location: Bilateral lower back right greater than left Pain description: Aching Aggravating factors: Standing and walking Relieving factors: Not standing and walking  PRECAUTIONS: None  RED FLAGS: None   WEIGHT BEARING RESTRICTIONS: No  FALLS:  Has patient fallen in last 6 months? 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  Comes to the gym Monday Wednesday and Friday.  PLOF: Independent  PATIENT GOALS: To have less pain in his back, to be able to walk 1 mile, to be able to stand straighter.  NEXT MD VISIT: Nothing currently scheduled  OBJECTIVE:   DIAGNOSTIC FINDINGS:  Nothing recent or pertinent    SCREENING FOR RED FLAGS: Bowel or bladder incontinence: No Spinal tumors:  No Cauda equina syndrome: No Compression fracture: No Abdominal aneurysm: No  COGNITION: Overall cognitive status: Within functional limits for tasks assessed  POSTURE: No Significant postural limitations  PALPATION: Significant tenderness to palpation in bilateral lower lumbar paraspinals and bilateral gluteals  LUMBAR ROM:   AROM eval  Flexion 70  Extension Stands -10 degrees from neutral  Right lateral flexion   Left lateral flexion   Right rotation 25% limited  Left rotation 25% limited   (Blank rows = not tested)  LOWER EXTREMITY ROM:     Passive  Right eval Left eval  Hip flexion    Hip extension    Hip abduction    Hip adduction    Hip internal rotation    Hip external rotation    Knee flexion    Knee extension    Ankle dorsiflexion    Ankle plantarflexion    Ankle inversion    Ankle eversion     (Blank rows = not tested)  LOWER EXTREMITY MMT:    MMT Right eval Left eval  Hip flexion 35 37.5  Hip extension    Hip abduction 37.3 36.6  Hip adduction    Hip internal rotation    Hip external rotation    Knee flexion    Knee extension 46.1 54.7  Ankle dorsiflexion    Ankle plantarflexion    Ankle inversion    Ankle eversion     (Blank rows = not tested)   GAIT: Walks with flexed trunk  TODAY'S TREATMENT:                                                                                                                              DATE:   Manual manual therapy performed in prone with head down position on table.  Trigger point release to lumbar spine paraspinals, trigger point release to bilateral gluteals gentle PA mobilizations throughout lower lumbar spine grade 1, supine LAD x 10 bilateral   PATIENT EDUCATION:  Education details: HEP, symptom management, benefits and risks of dry needling. Person educated: Patient Education method: Explanation, Demonstration, Tactile cues, Verbal cues, and Handouts Education comprehension:  verbalized understanding, returned demonstration, verbal cues required, tactile cues required, and needs further education  HOME EXERCISE PROGRAM: Will review gym program on future visits  ASSESSMENT:  CLINICAL IMPRESSION: Patient is a 77 year old long history of low back pain.  He has been coming to the gym and working on a program given to him previously by physical therapy.  Despite program he feels like he is having a progressive loss of ability to ambulate and ability to stand straight.  At this time he can walk less than 150 feet before he has a significant onset of pain.  He has significant spasming in his lower back.  He has difficulty coming to a full extension position.  Following manual therapy today he was much closer to a full extension position.  He was advised to see how long the benefits of manual therapy last for.  It is questionable how long the carryover will last.  Hope is we can  combine manual therapy with his current exercise program to improve overall ability to stand straight.  We will also take a look at his current program and look for potential triggers of pain. OBJECTIVE IMPAIRMENTS: Abnormal gait, decreased activity tolerance, difficulty walking, decreased ROM, improper body mechanics, and pain.   ACTIVITY LIMITATIONS: carrying, lifting, standing, squatting, stairs, transfers, and locomotion level  PARTICIPATION LIMITATIONS: driving, shopping, community activity, occupation, and yard work  PERSONAL FACTORS: 1-2 comorbidities: Spine surgeries A-fib  are also affecting patient's functional outcome.   REHAB POTENTIAL: Excellent  CLINICAL DECISION MAKING: Evolving/moderate complexity  EVALUATION COMPLEXITY: Moderate   GOALS: Goals reviewed with patient? Yes  SHORT TERM GOALS: Target date:   Patient will increase standing lumbar extension by 5 degrees Baseline: Goal status: INITIAL  2.  Therapy will review gym program and modify exercises that are potential  triggers of pain Baseline:  Goal status: INITIAL  3.  Patient will report a 25% reduction in pain standing Baseline:  Goal status: INITIAL  LONG TERM GOALS: Target date: 10/26/2023    Patient will ambulate 1/2 mile without increased lower back pain in order to perform daily tasks Baseline:  Goal status: INITIAL  2.  Patient will perform full gym program without increased low back pain Baseline:  Goal status: INITIAL  3.  Patient will be independent with complete program Baseline:  Goal status: INITIAL   PLAN:  PT FREQUENCY: 2x/week  PT DURATION: 8 weeks  PLANNED INTERVENTIONS: Therapeutic exercises, Therapeutic activity, Neuromuscular re-education, Balance training, Gait training, Patient/Family education, Self Care, Joint mobilization, Stair training, DME instructions, Aquatic Therapy, Dry Needling, Electrical stimulation, Cryotherapy, Moist heat, Taping, Manual therapy, and Re-evaluation. Marland Kitchen  PLAN FOR NEXT SESSION: Review gym program.  Consider tightening patient's range of motion.  If patient feels like he is not getting as much work increased weight.  Continue with manual therapy to lumbar spine.  Consider trigger point dry needling.   Dessie Coma, PT 09/01/2023, 2:19 PM

## 2023-09-14 ENCOUNTER — Encounter (HOSPITAL_BASED_OUTPATIENT_CLINIC_OR_DEPARTMENT_OTHER): Payer: Self-pay | Admitting: Physical Therapy

## 2023-09-14 ENCOUNTER — Ambulatory Visit (HOSPITAL_BASED_OUTPATIENT_CLINIC_OR_DEPARTMENT_OTHER): Payer: Medicare Other | Admitting: Physical Therapy

## 2023-09-14 DIAGNOSIS — M542 Cervicalgia: Secondary | ICD-10-CM

## 2023-09-14 DIAGNOSIS — G8929 Other chronic pain: Secondary | ICD-10-CM | POA: Diagnosis not present

## 2023-09-14 DIAGNOSIS — R2689 Other abnormalities of gait and mobility: Secondary | ICD-10-CM | POA: Diagnosis not present

## 2023-09-14 DIAGNOSIS — M545 Low back pain, unspecified: Secondary | ICD-10-CM | POA: Diagnosis not present

## 2023-09-15 ENCOUNTER — Encounter (HOSPITAL_BASED_OUTPATIENT_CLINIC_OR_DEPARTMENT_OTHER): Payer: Self-pay | Admitting: Physical Therapy

## 2023-09-15 NOTE — Therapy (Signed)
OUTPATIENT PHYSICAL THERAPY THORACOLUMBAR EVALUATION   Patient Name: [name] MRN: [id number] DOB:[birth year], 77 y.o., male Today's Date: 09/15/2023  END OF SESSION:  PT End of Session - 09/14/23 1602     Visit Number 2    Number of Visits 16    Date for PT Re-Evaluation 10/26/23    PT Start Time 1345    PT Stop Time 1428    PT Time Calculation (min) 43 min    Activity Tolerance Patient tolerated treatment well    Behavior During Therapy [facility] for tasks assessed/performed             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   Paroxysmal atrial flutter (HCC)    Past Surgical History:  Procedure Laterality Date   ABDOMINAL EXPOSURE N/A 01/06/2020   Procedure: ABDOMINAL EXPOSURE;  Surgeon: Larina Earthly, MD;  [facility]: [facility];  Service: Vascular;  Laterality: N/A;   ANTERIOR LUMBAR FUSION N/A 01/06/2020   Procedure: Lumbar Five- Sacral One  Anterior lumbar interbody fusion with infuse;  Surgeon: Barnett Abu, MD;  Location: [facility];  Service: Neurosurgery;  Laterality: N/A;  Lumbar Five- Sacral One  Anterior lumbar interbody fusion with infuse   CERVICAL FUSION  1996, 1997   2 surgery- skull to C7   COLON RESECTION  2007   COLONOSCOPY W/ POLYPECTOMY     CORONARY ATHERECTOMY N/A 07/01/2021   Procedure: CORONARY ATHERECTOMY;  Surgeon: Kathleene Hazel, MD;  [facility];  Service: Cardiovascular;  Laterality: N/A;   CORONARY PRESSURE/FFR STUDY N/A 06/18/2021   Procedure: INTRAVASCULAR PRESSURE WIRE/FFR STUDY;  Surgeon: Kathleene Hazel, MD;  [facility];  Service: Cardiovascular;  Laterality: N/A;   CORONARY STENT INTERVENTION N/A 07/01/2021   Procedure: CORONARY STENT INTERVENTION;  Surgeon: Kathleene Hazel, MD;  [facility];  Service: Cardiovascular;  Laterality: N/A;   CORONARY ULTRASOUND/IVUS N/A 07/01/2021   Procedure: Intravascular Ultrasound/IVUS;  Surgeon: Kathleene Hazel, MD;  [facility]: [facility];  Service: Cardiovascular;  Laterality: N/A;   HEMORRHOID SURGERY N/A 01/13/2016   Procedure: HEMORRHOIDECTOMY AND REMOVAL OF ANAL SKIN TAG;  Surgeon: Abigail Miyamoto, MD;  Location: [facility];  Service: General;  Laterality: N/A;   REPLACEMENT TOTAL KNEE Left 2005   left   RIGHT/LEFT HEART CATH AND CORONARY ANGIOGRAPHY N/A 06/18/2021   Procedure: RIGHT/LEFT HEART CATH AND CORONARY ANGIOGRAPHY;  Surgeon: Kathleene Hazel, MD;  [facility]: [facility];  Service: Cardiovascular;  Laterality: N/A;   SYNOVIAL CYST EXCISION     lumbar spine   TONSILLECTOMY     TOTAL KNEE ARTHROPLASTY Right 01/06/2017   Procedure: RIGHT TOTAL KNEE ARTHROPLASTY;  Surgeon: Eugenia Mcalpine, MD;  Location: [facility];  Service: Orthopedics;  Laterality: Right;  Adductior Block   Patient Active Problem List   Diagnosis Date Noted   Pure hypercholesterolemia 06/20/2023   Degenerative cervical disc 07/07/2021   Coronary artery disease involving native coronary artery of native heart  with unstable angina pectoris (HCC)    CAD (coronary artery disease)    Pseudoarthrosis of lumbar spine 01/06/2020   Coronary artery calcification seen on CT scan 01/29/2019   Change in bowel habits 06/12/2018   Primary osteoarthritis of right knee 01/06/2017   S/P knee replacement 01/06/2017   Diplopia 07/10/2013   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   Sore throat 07/26/2011   DIZZINESS 09/10/2009   ATRIAL FLUTTER, PAROXYSMAL 07/09/2009   Dyslipidemia 07/07/2009   Essential hypertension 07/07/2009     PCP: Tyson Dense MD  REFERRING PROVIDER: Celine Mans  REFERRING DIAG:  Diagnosis  M54.50 (ICD-10-CM) - Lumbar spine pain    Rationale for Evaluation and Treatment: Rehabilitation  THERAPY DIAG:  Cervicalgia  Chronic bilateral low back pain without sciatica  Other abnormalities of gait and mobility  ONSET DATE: Greater than 20 years  SUBJECTIVE:                                                                                                                                                                                           SUBJECTIVE STATEMENT: The patient reports good improvement for a few hours after the last visit. It has since gone back.   Eval:Patient has a significant history of low back and neck pain.  He had a cervical fusion greater than 20 years ago stemming from a significant injury.  He has then had subsequent lower back surgeries over time.  At this time he has significant low back pain.  He feels like he is bending over forward more.  He feels like he cannot stand straight. He fells at this time like he can't walk more then 20 min.   PERTINENT HISTORY:  Multijoint arthritis, A-fib, multiple low back surgeries,  PAIN:  Are you having pain? Yes: NPRS scale: 4-8/10 depending on activity Pain location: Bilateral lower back right greater than left Pain description: Aching Aggravating factors: Standing and walking Relieving factors: Not standing and walking  PRECAUTIONS: None  RED FLAGS: None   WEIGHT BEARING RESTRICTIONS: No  FALLS:  Has patient fallen in last 6 months? 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  Comes to the gym Monday Wednesday and Friday.  PLOF: Independent  PATIENT GOALS: To have less pain in his back, to be able to walk 1 mile, to be able to stand straighter.  NEXT MD VISIT: Nothing currently scheduled  OBJECTIVE:   DIAGNOSTIC FINDINGS:   Nothing recent or pertinent    SCREENING FOR RED FLAGS: Bowel or bladder incontinence: No Spinal tumors: No Cauda equina syndrome: No Compression fracture: No  Abdominal aneurysm: No  COGNITION: Overall cognitive status: Within functional limits for tasks assessed         POSTURE: No Significant postural limitations  PALPATION: Significant tenderness to palpation in bilateral lower lumbar paraspinals and bilateral gluteals  LUMBAR ROM:   AROM eval  Flexion 70  Extension Stands -10 degrees from neutral  Right lateral flexion   Left lateral flexion   Right rotation 25% limited  Left rotation 25% limited   (Blank rows = not tested)  LOWER EXTREMITY ROM:     Passive  Right eval Left eval  Hip flexion    Hip extension    Hip abduction    Hip adduction    Hip internal rotation    Hip external rotation    Knee flexion    Knee extension    Ankle dorsiflexion    Ankle plantarflexion    Ankle inversion    Ankle eversion     (Blank rows = not tested)  LOWER EXTREMITY MMT:    MMT Right eval Left eval  Hip flexion 35 37.5  Hip extension    Hip abduction 37.3 36.6  Hip adduction    Hip internal rotation    Hip external rotation    Knee flexion    Knee extension 46.1 54.7  Ankle dorsiflexion    Ankle plantarflexion    Ankle inversion    Ankle eversion     (Blank rows = not tested)   GAIT: Walks with flexed trunk  TODAY'S TREATMENT:                                                                                                                              DATE:  9/27 Manual manual therapy performed in prone with head down position on table.  Trigger point release to lumbar spine paraspinals, trigger point release to bilateral gluteals gentle PA mobilizations throughout lower lumbar spine grade 1, supine LAD x 10 bilateral  Reviewed gym exercises not to do including the back extension   Chop: reviewed technique 20 lbs 2x10  Pallof press 2x15 15 lbs       Eval: Manual manual therapy performed in prone with head down position on table.  Trigger point release to lumbar spine paraspinals, trigger point release to bilateral gluteals gentle PA mobilizations throughout lower lumbar spine grade 1, supine LAD x 10 bilateral   PATIENT EDUCATION:  Education details: HEP, symptom management, benefits and risks of dry needling. Person educated: Patient Education method: Explanation, Demonstration, Tactile cues, Verbal cues, and Handouts Education comprehension: verbalized understanding, returned demonstration, verbal cues required, tactile cues required, and needs further education  HOME EXERCISE PROGRAM: Will review gym program on future visits  ASSESSMENT:  CLINICAL IMPRESSION: The patient has a good immediate response to manual therapy. He reports over a few hours it goes back to the same. We advised him to continue self soft tissue mobilization at home. We reviewed his chop exercise for the  gym. He was advised to shorten his movement. He was also advised to stop doing back extensions loaded. We will continue to modify his exercises. We will consider trigger point dry needling next visit.    Eval: Patient is a 77 year old long history of low back pain.  He has been coming to the gym and working on a program given to him previously by physical therapy.  Despite program he feels like he is having a progressive loss of ability to ambulate and ability to stand straight.  At this time he can walk less than 150 feet before he has a significant onset of pain.  He has significant spasming in his lower back.  He has difficulty coming to a full extension position.  Following manual therapy today he was much closer to a full extension position.  He was advised to see how long the benefits of manual therapy last for.  It is questionable how long the carryover will last.  Hope is we can combine manual therapy with his current exercise program to improve  overall ability to stand straight.  We will also take a look at his current program and look for potential triggers of pain. OBJECTIVE IMPAIRMENTS: Abnormal gait, decreased activity tolerance, difficulty walking, decreased ROM, improper body mechanics, and pain.   ACTIVITY LIMITATIONS: carrying, lifting, standing, squatting, stairs, transfers, and locomotion level  PARTICIPATION LIMITATIONS: driving, shopping, community activity, occupation, and yard work  PERSONAL FACTORS: 1-2 comorbidities: Spine surgeries A-fib  are also affecting patient's functional outcome.   REHAB POTENTIAL: Excellent  CLINICAL DECISION MAKING: Evolving/moderate complexity  EVALUATION COMPLEXITY: Moderate   GOALS: Goals reviewed with patient? Yes  SHORT TERM GOALS: Target date:   Patient will increase standing lumbar extension by 5 degrees Baseline: Goal status: INITIAL  2.  Therapy will review gym program and modify exercises that are potential triggers of pain Baseline:  Goal status: INITIAL  3.  Patient will report a 25% reduction in pain standing Baseline:  Goal status: INITIAL  LONG TERM GOALS: Target date: 10/26/2023    Patient will ambulate 1/2 mile without increased lower back pain in order to perform daily tasks Baseline:  Goal status: INITIAL  2.  Patient will perform full gym program without increased low back pain Baseline:  Goal status: INITIAL  3.  Patient will be independent with complete program Baseline:  Goal status: INITIAL   PLAN:  PT FREQUENCY: 2x/week  PT DURATION: 8 weeks  PLANNED INTERVENTIONS: Therapeutic exercises, Therapeutic activity, Neuromuscular re-education, Balance training, Gait training, Patient/Family education, Self Care, Joint mobilization, Stair training, DME instructions, Aquatic Therapy, Dry Needling, Electrical stimulation, Cryotherapy, Moist heat, Taping, Manual therapy, and Re-evaluation. Marland Kitchen  PLAN FOR NEXT SESSION: Review gym program.  Consider  tightening patient's range of motion.  If patient feels like he is not getting as much work increased weight.  Continue with manual therapy to lumbar spine.  Consider trigger point dry needling.   Dessie Coma, PT 09/15/2023, 8:19 AM

## 2023-09-19 ENCOUNTER — Ambulatory Visit: Payer: Medicare Other | Attending: Cardiovascular Disease

## 2023-09-19 ENCOUNTER — Encounter (HOSPITAL_BASED_OUTPATIENT_CLINIC_OR_DEPARTMENT_OTHER): Payer: Medicare Other | Admitting: Physical Therapy

## 2023-09-19 DIAGNOSIS — R229 Localized swelling, mass and lump, unspecified: Secondary | ICD-10-CM | POA: Diagnosis not present

## 2023-09-19 DIAGNOSIS — Z85820 Personal history of malignant melanoma of skin: Secondary | ICD-10-CM | POA: Diagnosis not present

## 2023-09-19 DIAGNOSIS — D72829 Elevated white blood cell count, unspecified: Secondary | ICD-10-CM

## 2023-09-19 DIAGNOSIS — Z08 Encounter for follow-up examination after completed treatment for malignant neoplasm: Secondary | ICD-10-CM | POA: Diagnosis not present

## 2023-09-19 DIAGNOSIS — L821 Other seborrheic keratosis: Secondary | ICD-10-CM | POA: Diagnosis not present

## 2023-09-19 DIAGNOSIS — D225 Melanocytic nevi of trunk: Secondary | ICD-10-CM | POA: Diagnosis not present

## 2023-09-19 DIAGNOSIS — D492 Neoplasm of unspecified behavior of bone, soft tissue, and skin: Secondary | ICD-10-CM | POA: Diagnosis not present

## 2023-09-19 DIAGNOSIS — C434 Malignant melanoma of scalp and neck: Secondary | ICD-10-CM | POA: Diagnosis not present

## 2023-09-19 DIAGNOSIS — Z85828 Personal history of other malignant neoplasm of skin: Secondary | ICD-10-CM | POA: Diagnosis not present

## 2023-09-19 DIAGNOSIS — L814 Other melanin hyperpigmentation: Secondary | ICD-10-CM | POA: Diagnosis not present

## 2023-09-19 DIAGNOSIS — L0212 Furuncle of neck: Secondary | ICD-10-CM | POA: Diagnosis not present

## 2023-09-19 DIAGNOSIS — Z86006 Personal history of melanoma in-situ: Secondary | ICD-10-CM | POA: Diagnosis not present

## 2023-09-20 ENCOUNTER — Ambulatory Visit (HOSPITAL_BASED_OUTPATIENT_CLINIC_OR_DEPARTMENT_OTHER): Payer: Medicare Other | Attending: Family Medicine | Admitting: Physical Therapy

## 2023-09-20 DIAGNOSIS — M542 Cervicalgia: Secondary | ICD-10-CM | POA: Diagnosis not present

## 2023-09-20 DIAGNOSIS — M545 Low back pain, unspecified: Secondary | ICD-10-CM | POA: Insufficient documentation

## 2023-09-20 DIAGNOSIS — G8929 Other chronic pain: Secondary | ICD-10-CM | POA: Insufficient documentation

## 2023-09-20 DIAGNOSIS — R2689 Other abnormalities of gait and mobility: Secondary | ICD-10-CM | POA: Diagnosis not present

## 2023-09-20 LAB — CBC
Hematocrit: 39.6 % (ref 37.5–51.0)
Hemoglobin: 13.4 g/dL (ref 13.0–17.7)
MCH: 34.3 pg — ABNORMAL HIGH (ref 26.6–33.0)
MCHC: 33.8 g/dL (ref 31.5–35.7)
MCV: 101 fL — ABNORMAL HIGH (ref 79–97)
Platelets: 155 10*3/uL (ref 150–450)
RBC: 3.91 x10E6/uL — ABNORMAL LOW (ref 4.14–5.80)
RDW: 13.4 % (ref 11.6–15.4)
WBC: 6.4 10*3/uL (ref 3.4–10.8)

## 2023-09-20 NOTE — Therapy (Signed)
OUTPATIENT PHYSICAL THERAPY THORACOLUMBAR EVALUATION   Patient Name: [name] MRN: [id number] DOB:22-Sep-1946, 77 y.o., male Today's Date: 09/21/2023  END OF SESSION:  PT End of Session - 09/21/23 1038     Visit Number 3    Number of Visits 16    Date for PT Re-Evaluation 10/26/23    PT Start Time 1145    PT Stop Time 1226    PT Time Calculation (min) 41 min    Activity Tolerance Patient tolerated treatment well    Behavior During Therapy [facility] for tasks assessed/performed              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   Paroxysmal atrial flutter (HCC)    Past Surgical History:  Procedure Laterality Date   ABDOMINAL EXPOSURE N/A 01/06/2020   Procedure: ABDOMINAL EXPOSURE;  Surgeon: Larina Earthly, MD;  [facility]: [facility] OR;  Service: Vascular;  Laterality: N/A;   ANTERIOR LUMBAR FUSION N/A 01/06/2020   Procedure: Lumbar Five- Sacral One  Anterior lumbar interbody fusion with infuse;  Surgeon: Barnett Abu, MD;  Location: [facility] OR;  Service: Neurosurgery;  Laterality: N/A;  Lumbar Five- Sacral One  Anterior lumbar interbody fusion with infuse   CERVICAL FUSION  1996, 1997   2 surgery- skull to C7   COLON RESECTION  2007   COLONOSCOPY W/ POLYPECTOMY     CORONARY ATHERECTOMY N/A 07/01/2021   Procedure: CORONARY ATHERECTOMY;  Surgeon: Kathleene Hazel, MD;  [facility];  Service: Cardiovascular;  Laterality: N/A;   CORONARY PRESSURE/FFR STUDY N/A 06/18/2021   Procedure: INTRAVASCULAR PRESSURE WIRE/FFR STUDY;  Surgeon: Kathleene Hazel, MD;  [facility];  Service: Cardiovascular;  Laterality: N/A;   CORONARY STENT INTERVENTION N/A 07/01/2021   Procedure: CORONARY STENT INTERVENTION;  Surgeon: Kathleene Hazel, MD;  [facility];  Service: Cardiovascular;  Laterality: N/A;   CORONARY ULTRASOUND/IVUS N/A 07/01/2021   Procedure: Intravascular Ultrasound/IVUS;  Surgeon: Kathleene Hazel, MD;  [facility];  Service: Cardiovascular;  Laterality: N/A;   HEMORRHOID SURGERY N/A 01/13/2016   Procedure: HEMORRHOIDECTOMY AND REMOVAL OF ANAL SKIN TAG;  Surgeon: Abigail Miyamoto, MD;  [facility];  Service: General;  Laterality: N/A;   REPLACEMENT TOTAL KNEE Left 2005   left   RIGHT/LEFT HEART CATH AND CORONARY ANGIOGRAPHY N/A 06/18/2021   Procedure: RIGHT/LEFT HEART CATH AND CORONARY ANGIOGRAPHY;  Surgeon: Kathleene Hazel, MD;  [facility];  Service: Cardiovascular;  Laterality: N/A;   SYNOVIAL CYST EXCISION     lumbar spine   TONSILLECTOMY     TOTAL KNEE ARTHROPLASTY Right 01/06/2017   Procedure: RIGHT TOTAL KNEE ARTHROPLASTY;  Surgeon: Eugenia Mcalpine, MD;  Location: [facility];  Service: Orthopedics;  Laterality: Right;  Adductior Block   Patient Active Problem List   Diagnosis Date Noted   Pure hypercholesterolemia 06/20/2023   Degenerative cervical disc 07/07/2021   Coronary artery disease involving native coronary artery of native  heart with unstable angina pectoris (HCC)    CAD (coronary artery disease)    Pseudoarthrosis of lumbar spine 01/06/2020   Coronary artery calcification seen on CT scan 01/29/2019   Change in bowel habits 06/12/2018   Primary osteoarthritis of right knee 01/06/2017   S/P knee replacement 01/06/2017   Diplopia 07/10/2013   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   Sore throat 07/26/2011   DIZZINESS 09/10/2009   ATRIAL FLUTTER, PAROXYSMAL 07/09/2009   Dyslipidemia 07/07/2009   Essential hypertension  07/07/2009    PCP: Tyson Dense MD  REFERRING PROVIDER: Celine Mans  REFERRING DIAG:  Diagnosis  M54.50 (ICD-10-CM) - Lumbar spine pain    Rationale for Evaluation and Treatment: Rehabilitation  THERAPY DIAG:  Cervicalgia  Chronic bilateral low back pain without sciatica  Other abnormalities of gait and mobility  ONSET DATE: Greater than 20 years  SUBJECTIVE:                                                                                                                                                                                           SUBJECTIVE STATEMENT: The patient has modified his exercises. He feels like it is about the same. It does well for a short time after therapy. We will trial needling today,    Eval:Patient has a significant history of low back and neck pain.  He had a cervical fusion greater than 20 years ago stemming from a significant injury.  He has then had subsequent lower back surgeries over time.  At this time he has significant low back pain.  He feels like he is bending over forward more.  He feels like he cannot stand straight. He fells at this time like he can't walk more then 20 min.   PERTINENT HISTORY:  Multijoint arthritis, A-fib, multiple low back surgeries,  PAIN:  Are you having pain? Yes: NPRS scale: 4-8/10 depending on activity Pain location: Bilateral lower back right greater than left Pain description: Aching Aggravating factors: Standing and walking Relieving factors: Not standing and walking  PRECAUTIONS: None  RED FLAGS: None   WEIGHT BEARING RESTRICTIONS: No  FALLS:  Has patient fallen in last 6 months? 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  Comes to the gym Monday Wednesday and Friday.  PLOF: Independent  PATIENT GOALS: To have less pain in his back, to be able to walk 1 mile, to be able to stand straighter.  NEXT MD VISIT: Nothing  currently scheduled  OBJECTIVE:   DIAGNOSTIC FINDINGS:  Nothing recent or pertinent    SCREENING FOR RED FLAGS: Bowel or bladder  incontinence: No Spinal tumors: No Cauda equina syndrome: No Compression fracture: No Abdominal aneurysm: No  COGNITION: Overall cognitive status: Within functional limits for tasks assessed         POSTURE: No Significant postural limitations  PALPATION: Significant tenderness to palpation in bilateral lower lumbar paraspinals and bilateral gluteals  LUMBAR ROM:   AROM eval  Flexion 70  Extension Stands -10 degrees from neutral  Right lateral flexion   Left lateral flexion   Right rotation 25% limited  Left rotation 25% limited   (Blank rows = not tested)  LOWER EXTREMITY ROM:     Passive  Right eval Left eval  Hip flexion    Hip extension    Hip abduction    Hip adduction    Hip internal rotation    Hip external rotation    Knee flexion    Knee extension    Ankle dorsiflexion    Ankle plantarflexion    Ankle inversion    Ankle eversion     (Blank rows = not tested)  LOWER EXTREMITY MMT:    MMT Right eval Left eval  Hip flexion 35 37.5  Hip extension    Hip abduction 37.3 36.6  Hip adduction    Hip internal rotation    Hip external rotation    Knee flexion    Knee extension 46.1 54.7  Ankle dorsiflexion    Ankle plantarflexion    Ankle inversion    Ankle eversion     (Blank rows = not tested)   GAIT: Walks with flexed trunk  TODAY'S TREATMENT:                                                                                                                              DATE:    10/2 Trigger Point Dry-Needling  Treatment instructions: Expect mild to moderate muscle soreness. S/S of pneumothorax if dry needled over a lung field, and to seek immediate medical attention should they occur. Patient verbalized understanding of these instructions and education.  Patient Consent Given: Yes Education handout  provided: Yes Muscles treated: L1-2-3 parapsinals bilateral; bilateral gluteals  Electrical stimulation performed: No Parameters: N/A Treatment response/outcome: great twitch   Ball stretch fwd x10 5 sec hold   Shown how to do in the gym as well.   Manual manual therapy performed in prone with head down position on table.  Trigger point release to lumbar spine paraspinals, trigger point release to bilateral gluteals gentle PA mobilizations throughout upper lumbar spine grade 1, supine LAD with grade II occilations   9/27 Manual manual therapy performed in prone with head down position on table.  Trigger point release to lumbar spine paraspinals, trigger point release to bilateral gluteals gentle PA mobilizations throughout lower lumbar spine grade 1, supine LAD x 10 bilateral  Reviewed gym exercises not to do including the back extension   Chop: reviewed technique 20 lbs 2x10  Pallof press 2x15 15 lbs  Eval: Manual manual therapy performed in prone with head down position on table.  Trigger point release to lumbar spine paraspinals, trigger point release to bilateral gluteals gentle PA mobilizations throughout lower lumbar spine grade 1, supine LAD x 10 bilateral   PATIENT EDUCATION:  Education details: HEP, symptom management, benefits and risks of dry needling. Person educated: Patient Education method: Explanation, Demonstration, Tactile cues, Verbal cues, and Handouts Education comprehension: verbalized understanding, returned demonstration, verbal cues required, tactile cues required, and needs further education  HOME EXERCISE PROGRAM: Will review gym program on future visits  ASSESSMENT:  CLINICAL IMPRESSION: The patient had a great twitch with needling. He had a palpable decrease in muscle tension following. He was advised to continue to monitor his carryover. We reviewed a lengthening stretch he can do before he works out at [employer]. He wll continue to hold on back  extension exercises. We will assess benefit of needling next visit. We will also continue to work on his exercises.   Eval: Patient is a 77 year old long history of low back pain.  He has been coming to the gym and working on a program given to him previously by physical therapy.  Despite program he feels like he is having a progressive loss of ability to ambulate and ability to stand straight.  At this time he can walk less than 150 feet before he has a significant onset of pain.  He has significant spasming in his lower back.  He has difficulty coming to a full extension position.  Following manual therapy today he was much closer to a full extension position.  He was advised to see how long the benefits of manual therapy last for.  It is questionable how long the carryover will last.  Hope is we can combine manual therapy with his current exercise program to improve overall ability to stand straight.  We will also take a look at his current program and look for potential triggers of pain. OBJECTIVE IMPAIRMENTS: Abnormal gait, decreased activity tolerance, difficulty walking, decreased ROM, improper body mechanics, and pain.   ACTIVITY LIMITATIONS: carrying, lifting, standing, squatting, stairs, transfers, and locomotion level  PARTICIPATION LIMITATIONS: driving, shopping, community activity, occupation, and yard work  PERSONAL FACTORS: 1-2 comorbidities: Spine surgeries A-fib  are also affecting patient's functional outcome.   REHAB POTENTIAL: Excellent  CLINICAL DECISION MAKING: Evolving/moderate complexity  EVALUATION COMPLEXITY: Moderate   GOALS: Goals reviewed with patient? Yes  SHORT TERM GOALS: Target date: 10/19/2023    Patient will increase standing lumbar extension by 5 degrees Baseline: Goal status: Progressing 10/3   2.  Therapy will review gym program and modify exercises that are potential triggers of pain Baseline:  Goal status: INITIAL  3.  Patient will report a  25% reduction in pain standing Baseline:  Goal status: INITIAL  LONG TERM GOALS: Target date: 10/26/2023    Patient will ambulate 1/2 mile without increased lower back pain in order to perform daily tasks Baseline:  Goal status: INITIAL  2.  Patient will perform full gym program without increased low back pain Baseline:  Goal status: INITIAL  3.  Patient will be independent with complete program Baseline:  Goal status: INITIAL   PLAN:  PT FREQUENCY: 2x/week  PT DURATION: 8 weeks  PLANNED INTERVENTIONS: Therapeutic exercises, Therapeutic activity, Neuromuscular re-education, Balance training, Gait training, Patient/Family education, Self Care, Joint mobilization, Stair training, DME instructions, Aquatic Therapy, Dry Needling, Electrical stimulation, Cryotherapy, Moist heat, Taping, Manual therapy, and Re-evaluation. Marland Kitchen  PLAN FOR  NEXT SESSION: Review gym program.  Consider tightening patient's range of motion.  If patient feels like he is not getting as much work increased weight.  Continue with manual therapy to lumbar spine.  Consider trigger point dry needling.   Dessie Coma, PT 09/21/2023, 10:42 AM

## 2023-09-21 ENCOUNTER — Encounter (HOSPITAL_BASED_OUTPATIENT_CLINIC_OR_DEPARTMENT_OTHER): Payer: Self-pay | Admitting: Physical Therapy

## 2023-09-22 ENCOUNTER — Other Ambulatory Visit (HOSPITAL_BASED_OUTPATIENT_CLINIC_OR_DEPARTMENT_OTHER): Payer: Self-pay

## 2023-09-22 DIAGNOSIS — Z23 Encounter for immunization: Secondary | ICD-10-CM | POA: Diagnosis not present

## 2023-09-22 MED ORDER — INFLUENZA VAC A&B SURF ANT ADJ 0.5 ML IM SUSY
0.5000 mL | PREFILLED_SYRINGE | Freq: Once | INTRAMUSCULAR | 0 refills | Status: AC
  Filled 2023-09-22: qty 0.5, 1d supply, fill #0

## 2023-09-22 MED ORDER — COVID-19 MRNA VAC-TRIS(PFIZER) 30 MCG/0.3ML IM SUSY
0.3000 mL | PREFILLED_SYRINGE | Freq: Once | INTRAMUSCULAR | 0 refills | Status: AC
  Filled 2023-09-22: qty 0.3, 1d supply, fill #0

## 2023-09-28 ENCOUNTER — Encounter (HOSPITAL_BASED_OUTPATIENT_CLINIC_OR_DEPARTMENT_OTHER): Payer: Medicare Other | Admitting: Physical Therapy

## 2023-10-03 ENCOUNTER — Encounter (HOSPITAL_BASED_OUTPATIENT_CLINIC_OR_DEPARTMENT_OTHER): Payer: Medicare Other | Admitting: Physical Therapy

## 2023-10-04 NOTE — Progress Notes (Unsigned)
[name] [address] [place] [address] Phone: [phone] Subjective:   [name], am serving as a scribe for Dr. Antoine [name]. \ I'm seeing this patient by the request  of:  Tony Housekeeper, MD  CC: Low back pain follow-up  BJY:[id number]  07/06/2023 Patient is still complaining of the same symptomatology at this point.  We discussed again about different treatment options including increasing his aquatic therapy which patient does state seems to help him in every facet of life.  Patient states that that transportation is concerning on left.  Patient is walking with the aid of a shopping cart in different stores as other treatment.  Patient is seen in neurosurgery and is having intermittent spinal injections done but they do seem to be far less beneficial than what he is having recently.  We discussed different medications which she was trying to avoid some but is continuing to take the oxycodone and warned of the potential harm as well as tolerance.  We discussed with patient about Celebrex and using the more of a burst being as needed dosing.  Discussed with patient can come back again in 3 to 4 months if he would like to discuss otherwise.  Total time with patient 31 minutes    Update 10/05/2023 [name] is a 77 y.o. male coming in with complaint of lumbar spine pain. Patient states talking about the same old thing. Back has taken a turn for the worse. See ing Neurosurgeon beginning of Nov, thinks he may be getting for surgery. At a point that has stopped all activity. Enjoyed PT and the dry needling.      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   Paroxysmal atrial flutter (HCC)    Past Surgical History:  Procedure Laterality Date   ABDOMINAL EXPOSURE N/A 01/06/2020   Procedure: ABDOMINAL EXPOSURE;  Surgeon: Tony Earthly, MD;  [facility];  Service: Vascular;  Laterality: N/A;   ANTERIOR LUMBAR FUSION N/A 01/06/2020   Procedure: Lumbar Five- Sacral One  Anterior lumbar interbody fusion with infuse;  Surgeon: Tony Abu, MD;  [facility];  Service: Neurosurgery;  Laterality: N/A;  Lumbar Five- Sacral One  Anterior lumbar interbody fusion with infuse   CERVICAL FUSION  1996, 1997   2 surgery- skull to C7   COLON RESECTION  2007   COLONOSCOPY W/ POLYPECTOMY     CORONARY ATHERECTOMY N/A 07/01/2021   Procedure: CORONARY ATHERECTOMY;  Surgeon: Tony Hazel, MD;  [facility];  Service: Cardiovascular;  Laterality: N/A;   CORONARY PRESSURE/FFR STUDY N/A 06/18/2021   Procedure: INTRAVASCULAR PRESSURE WIRE/FFR STUDY;  Surgeon: Tony Hazel, MD;  [facility];  Service: Cardiovascular;  Laterality: N/A;   CORONARY STENT INTERVENTION N/A 07/01/2021   Procedure: CORONARY STENT INTERVENTION;  Surgeon: Tony Hazel, MD;  [facility];  Service: Cardiovascular;  Laterality: N/A;   CORONARY ULTRASOUND/IVUS N/A 07/01/2021   Procedure:  Intravascular Ultrasound/IVUS;  Surgeon: Tony Hazel, MD;  [facility];  Service: Cardiovascular;  Laterality: N/A;   HEMORRHOID SURGERY N/A 01/13/2016   Procedure: HEMORRHOIDECTOMY AND REMOVAL OF ANAL SKIN TAG;  Surgeon: Tony Miyamoto, MD;  [facility];  Service: General;  Laterality: N/A;   REPLACEMENT TOTAL KNEE Left 2005   left   RIGHT/LEFT HEART CATH AND CORONARY ANGIOGRAPHY N/A 06/18/2021   Procedure: RIGHT/LEFT HEART CATH AND CORONARY ANGIOGRAPHY;  Surgeon:  Tony Hazel, MD;  [facility];  Service: Cardiovascular;  Laterality: N/A;   SYNOVIAL CYST EXCISION     lumbar spine   TONSILLECTOMY     TOTAL KNEE ARTHROPLASTY Right 01/06/2017   Procedure: RIGHT TOTAL KNEE ARTHROPLASTY;  Surgeon: Tony Mcalpine, MD;  Location: [facility];  Service: Orthopedics;  Laterality: Right;  Adductior Block   Social History   Socioeconomic History   Marital status: Married    Spouse name: [name]   Number of children: 4   Years of education: 12   Highest education level: Not on file  Occupational History   Occupation: Retired    Associate Professor: RETIRED  Tobacco Use   Smoking status: Never   Smokeless tobacco: Never  Vaping Use   Vaping status: Never Used  Substance and Sexual Activity   Alcohol use: No   Drug use: No   Sexual activity: Not on file  Other Topics Concern   Not on file  Social History Narrative   Patient lives at home with Wife [name].    Patient has 4 children.    Patient is currently not working. He is retired and disabled.    Patient has a high school education.    Social Determinants of Health   Financial Resource Strain: Not on file  Food Insecurity: Not on file  Transportation Needs: Not on file  Physical Activity: Not on file  Stress: Not on file  Social Connections: Unknown (05/02/2022)   Received from [facility], [facility]   Social Network    Social Network: Not on file   Allergies  Allergen Reactions   Alfuzosin Other (See Comments) and Swelling    Felt winded/faint   Duloxetine Hcl Other (See Comments) and Swelling    "Nausea, dizzy, sick"  Other Reaction(s): Dizziness   Gabapentin Nausea Only and Nausea And Vomiting    Other Reaction(s): dizzy   Pregabalin Nausea Only, Other (See Comments), Nausea And Vomiting and Itching    Felt poorly  Other Reaction(s): dizzy   Tamsulosin Hcl Other (See Comments), Nausea Only and Swelling    Orthostatic hypotension   Contrast Media [Iodinated  Contrast Media] Rash    Cath dye 06/18/21 rash started next day   Niacin Diarrhea   Family History  Problem Relation Age of Onset   Coronary artery disease Mother    Coronary artery disease Father    Colon cancer Neg Hx    Stomach cancer Neg Hx      Current Outpatient Medications (Cardiovascular):    amLODipine (NORVASC) 5 MG tablet, TAKE 1 TABLET BY MOUTH TWICE DAILY   benazepril (LOTENSIN) 20 MG tablet, TAKE 1 TABLET BY MOUTH TWICE DAILY   hydrochlorothiazide (,MICROZIDE/HYDRODIURIL,) 12.5 MG capsule, Take 12.5 mg by mouth daily with lunch.    metoprolol tartrate (LOPRESSOR) 25 MG tablet, Take 25 mg by mouth daily as needed (afib.).    nitroGLYCERIN (NITROSTAT) 0.4 MG SL tablet, Place 1 tablet (0.4 mg total) under the tongue every 5 (five) minutes  as needed.   rosuvastatin (CRESTOR) 10 MG tablet, Take 1 tablet (10 mg total) by mouth daily.   tadalafil (CIALIS) 20 MG tablet, Take 1 tablet (20 mg total) by mouth daily as needed.   Current Outpatient Medications (Analgesics):    aspirin EC 81 MG tablet, Take 1 tablet (81 mg total) by mouth daily. Swallow whole.   celecoxib (CELEBREX) 200 MG capsule, Take 200 mg by mouth as directed. As needed for pain   celecoxib (CELEBREX) 200 MG capsule, Take 1 capsule (200 mg total) by mouth daily as needed.   oxyCODONE (OXY IR/ROXICODONE) 5 MG immediate release tablet, Take 1 tablet (5 mg total) by mouth every 6 (six) hours as needed.   oxyCODONE (ROXICODONE) 5 MG immediate release tablet, Take 1-2 tablets (5-10 mg total) by mouth every 4 (four) hours as needed for severe pain or breakthrough pain.   Current Outpatient Medications (Other):    B Complex-C (B-COMPLEX WITH VITAMIN C) tablet, Take 1 tablet by mouth daily with lunch.   Cholecalciferol (VITAMIN D3) 50 MCG (2000 UT) TABS, Take 2,000 Units by mouth daily with lunch. B complex, vitamin c 500   Glucosamine-Chondroitin (COSAMIN DS PO), Take 1 tablet by mouth every evening.   methocarbamol  (ROBAXIN) 500 MG tablet, Take 500 mg by mouth every 8 (eight) hours as needed for muscle spasms.   Multiple Vitamin (MULTIVITAMIN WITH MINERALS) TABS tablet, Take 1 tablet by mouth daily with lunch. Adult 50+   scopolamine (TRANSDERM-SCOP) 1 MG/3DAYS, Place 1 patch onto the skin every three (3) days as needed (nausea).   vitamin C (ASCORBIC ACID) 500 MG tablet, Take 500 mg by mouth daily with lunch.   Reviewed prior external information including notes and imaging from  primary care provider As well as notes that were available from care everywhere and other healthcare systems.  Past medical history, social, surgical and family history all reviewed in electronic medical record.  No pertanent information unless stated regarding to the chief complaint.   Review of Systems:  No headache, visual changes, nausea, vomiting, diarrhea, constipation, dizziness, abdominal pain, skin rash, fevers, chills, night sweats, weight loss, swollen lymph nodes,, joint swelling, chest pain, shortness of breath, mood changes. POSITIVE muscle aches, body aches  Objective  Blood pressure 128/72, pulse 72, height 5\' 11"  (1.803 m), weight 242 lb (109.8 kg), SpO2 95%.   General: No apparent distress alert and oriented x3 mood and affect normal, dressed appropriately.  HEENT: Pupils equal, extraocular movements intact  Patient has more of an antalgic gait noted.  Patient does have some tightness noted with straight leg test bilaterally.  Worsening pain noted with any type of extension of the back by more than 5 degrees. Patient has radicular symptoms with extension of the back noted.  Patient has tenderness palpation diffusely in the lower back.  Deep tendon reflexes 1+ of the Achilles but symmetric.  No beats of clonus    Impression and Recommendations:    The above documentation has been reviewed and is accurate and complete Judi Saa, DO

## 2023-10-05 ENCOUNTER — Encounter: Payer: Self-pay | Admitting: Family Medicine

## 2023-10-05 ENCOUNTER — Ambulatory Visit: Payer: Medicare Other

## 2023-10-05 ENCOUNTER — Ambulatory Visit: Payer: Medicare Other | Admitting: Family Medicine

## 2023-10-05 VITALS — BP 128/72 | HR 72 | Ht 71.0 in | Wt 242.0 lb

## 2023-10-05 DIAGNOSIS — M5136 Other intervertebral disc degeneration, lumbar region with discogenic back pain only: Secondary | ICD-10-CM | POA: Diagnosis not present

## 2023-10-05 DIAGNOSIS — M545 Low back pain, unspecified: Secondary | ICD-10-CM

## 2023-10-05 DIAGNOSIS — I700 Atherosclerosis of aorta: Secondary | ICD-10-CM | POA: Diagnosis not present

## 2023-10-05 DIAGNOSIS — Z981 Arthrodesis status: Secondary | ICD-10-CM | POA: Diagnosis not present

## 2023-10-05 DIAGNOSIS — M48061 Spinal stenosis, lumbar region without neurogenic claudication: Secondary | ICD-10-CM | POA: Diagnosis not present

## 2023-10-05 MED ORDER — METHYLPREDNISOLONE ACETATE 80 MG/ML IJ SUSP
80.0000 mg | Freq: Once | INTRAMUSCULAR | Status: AC
Start: 2023-10-05 — End: 2023-10-05
  Administered 2023-10-05: 80 mg via INTRAMUSCULAR

## 2023-10-05 MED ORDER — KETOROLAC TROMETHAMINE 60 MG/2ML IM SOLN
60.0000 mg | Freq: Once | INTRAMUSCULAR | Status: AC
Start: 2023-10-05 — End: 2023-10-05
  Administered 2023-10-05: 60 mg via INTRAMUSCULAR

## 2023-10-05 NOTE — Patient Instructions (Addendum)
Cocktail injection We are here if you need [place] See. Dr. Jarvis [facility] [phone] Call Today  When we receive your results we will contact you.

## 2023-10-05 NOTE — Assessment & Plan Note (Signed)
Patient has done relatively well but has had the fusion from L4-S1 and no concern for adjacent segment disease mostly at the L3-L4 area based on patient's presentation and symptoms.  Do feel that there is a chance also for worsening spinal stenosis.  Discussed which activities to do and which ones to avoid.  Increasing activity slowly over the course of the next weeks otherwise.  Patient given Toradol and Depo-Medrol today but do not want to make any other significant changes in medication.  He does have the Celebrex he can use on 5-day burst when necessary.  Patient is going to follow-up with neurosurgery.  Do feel that advanced imaging though is warranted with the amount of difficulty he is having even with doing all the modalities including physical therapy for greater than 3 months.  Depending on MRI hopefully this will tell us different treatment options that are available.  Patient knows we are here if he has any other questions.

## 2023-10-07 ENCOUNTER — Ambulatory Visit
Admission: RE | Admit: 2023-10-07 | Discharge: 2023-10-07 | Disposition: A | Discharge status: Home / Self Care | Payer: Medicare Other | Source: Ambulatory Visit | Attending: Family Medicine | Admitting: Family Medicine

## 2023-10-07 DIAGNOSIS — M545 Low back pain, unspecified: Secondary | ICD-10-CM | POA: Diagnosis not present

## 2023-10-12 ENCOUNTER — Encounter (HOSPITAL_BASED_OUTPATIENT_CLINIC_OR_DEPARTMENT_OTHER): Payer: Medicare Other | Admitting: Physical Therapy

## 2023-10-12 ENCOUNTER — Other Ambulatory Visit (HOSPITAL_BASED_OUTPATIENT_CLINIC_OR_DEPARTMENT_OTHER): Payer: Self-pay

## 2023-10-17 ENCOUNTER — Encounter (HOSPITAL_BASED_OUTPATIENT_CLINIC_OR_DEPARTMENT_OTHER): Payer: Medicare Other | Admitting: Physical Therapy

## 2023-10-24 ENCOUNTER — Encounter (HOSPITAL_BASED_OUTPATIENT_CLINIC_OR_DEPARTMENT_OTHER): Payer: Medicare Other | Admitting: Physical Therapy

## 2023-10-24 ENCOUNTER — Encounter: Payer: Self-pay | Admitting: Family Medicine

## 2023-10-24 DIAGNOSIS — R7303 Prediabetes: Secondary | ICD-10-CM | POA: Diagnosis not present

## 2023-10-24 DIAGNOSIS — M5090 Cervical disc disorder, unspecified, unspecified cervical region: Secondary | ICD-10-CM | POA: Diagnosis not present

## 2023-10-24 DIAGNOSIS — H53143 Visual discomfort, bilateral: Secondary | ICD-10-CM | POA: Diagnosis not present

## 2023-10-24 DIAGNOSIS — I4892 Unspecified atrial flutter: Secondary | ICD-10-CM | POA: Diagnosis not present

## 2023-10-24 DIAGNOSIS — I700 Atherosclerosis of aorta: Secondary | ICD-10-CM | POA: Diagnosis not present

## 2023-10-24 DIAGNOSIS — E538 Deficiency of other specified B group vitamins: Secondary | ICD-10-CM | POA: Diagnosis not present

## 2023-10-24 DIAGNOSIS — N529 Male erectile dysfunction, unspecified: Secondary | ICD-10-CM | POA: Diagnosis not present

## 2023-10-24 DIAGNOSIS — I10 Essential (primary) hypertension: Secondary | ICD-10-CM | POA: Diagnosis not present

## 2023-10-24 DIAGNOSIS — I2510 Atherosclerotic heart disease of native coronary artery without angina pectoris: Secondary | ICD-10-CM | POA: Diagnosis not present

## 2023-10-24 DIAGNOSIS — N411 Chronic prostatitis: Secondary | ICD-10-CM | POA: Diagnosis not present

## 2023-10-24 DIAGNOSIS — Z23 Encounter for immunization: Secondary | ICD-10-CM | POA: Diagnosis not present

## 2023-10-24 DIAGNOSIS — E782 Mixed hyperlipidemia: Secondary | ICD-10-CM | POA: Diagnosis not present

## 2023-10-24 DIAGNOSIS — D039 Melanoma in situ, unspecified: Secondary | ICD-10-CM | POA: Diagnosis not present

## 2023-10-27 DIAGNOSIS — Z6833 Body mass index (BMI) 33.0-33.9, adult: Secondary | ICD-10-CM | POA: Diagnosis not present

## 2023-10-27 DIAGNOSIS — M48061 Spinal stenosis, lumbar region without neurogenic claudication: Secondary | ICD-10-CM | POA: Diagnosis not present

## 2023-10-30 ENCOUNTER — Other Ambulatory Visit (HOSPITAL_BASED_OUTPATIENT_CLINIC_OR_DEPARTMENT_OTHER): Payer: Self-pay

## 2023-10-30 MED ORDER — OXYCODONE HCL 5 MG PO TABS
5.0000 mg | ORAL_TABLET | Freq: Four times a day (QID) | ORAL | 0 refills | Status: DC | PRN
  Filled 2023-10-30: qty 120, 30d supply, fill #0

## 2023-10-31 ENCOUNTER — Encounter (HOSPITAL_BASED_OUTPATIENT_CLINIC_OR_DEPARTMENT_OTHER): Payer: Medicare Other | Admitting: Physical Therapy

## 2023-10-31 ENCOUNTER — Other Ambulatory Visit (HOSPITAL_BASED_OUTPATIENT_CLINIC_OR_DEPARTMENT_OTHER): Payer: Self-pay

## 2023-11-14 ENCOUNTER — Other Ambulatory Visit (HOSPITAL_BASED_OUTPATIENT_CLINIC_OR_DEPARTMENT_OTHER): Payer: Self-pay

## 2023-11-14 MED ORDER — OXYCODONE HCL 5 MG PO TABS: 5.0000 mg | ORAL_TABLET | Freq: Four times a day (QID) | ORAL | 0 refills | Status: DC | PRN

## 2023-11-14 MED ORDER — OXYCODONE HCL 5 MG PO TABS
5.0000 mg | ORAL_TABLET | Freq: Four times a day (QID) | ORAL | 0 refills | Status: DC | PRN
  Filled 2024-01-17 – 2024-01-18 (×2): qty 120, 30d supply, fill #0

## 2023-11-21 DIAGNOSIS — H40013 Open angle with borderline findings, low risk, bilateral: Secondary | ICD-10-CM | POA: Diagnosis not present

## 2023-11-21 DIAGNOSIS — H40053 Ocular hypertension, bilateral: Secondary | ICD-10-CM | POA: Diagnosis not present

## 2023-11-23 ENCOUNTER — Ambulatory Visit (HOSPITAL_BASED_OUTPATIENT_CLINIC_OR_DEPARTMENT_OTHER): Payer: Medicare Other | Attending: Family Medicine | Admitting: Physical Therapy

## 2023-11-23 ENCOUNTER — Encounter (HOSPITAL_BASED_OUTPATIENT_CLINIC_OR_DEPARTMENT_OTHER): Payer: Self-pay | Admitting: Physical Therapy

## 2023-11-23 DIAGNOSIS — M545 Low back pain, unspecified: Secondary | ICD-10-CM | POA: Insufficient documentation

## 2023-11-23 DIAGNOSIS — R2689 Other abnormalities of gait and mobility: Secondary | ICD-10-CM | POA: Diagnosis not present

## 2023-11-23 DIAGNOSIS — G8929 Other chronic pain: Secondary | ICD-10-CM | POA: Diagnosis not present

## 2023-11-23 DIAGNOSIS — M542 Cervicalgia: Secondary | ICD-10-CM | POA: Diagnosis not present

## 2023-11-23 NOTE — Therapy (Signed)
OUTPATIENT PHYSICAL THERAPY THORACOLUMBAR Progress note    Patient Name: [name] MRN: [id number] DOB:1946/10/10, 77 y.o., male Today's Date: 11/23/2023  END OF SESSION:  PT End of Session - 11/23/23 1346     Visit Number 4    Number of Visits 16    Date for PT Re-Evaluation 01/18/24    Authorization Type progress note done on visit 4    PT Start Time 1100    PT Stop Time 1143    PT Time Calculation (min) 43 min    Activity Tolerance Patient tolerated treatment well    Behavior During Therapy WFL for tasks assessed/performed               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   Paroxysmal atrial flutter (HCC)    Past Surgical History:  Procedure Laterality Date   ABDOMINAL EXPOSURE N/A 01/06/2020   Procedure: ABDOMINAL EXPOSURE;  Surgeon: Larina Earthly, MD;  Location: [facility];  Service: Vascular;  Laterality: N/A;   ANTERIOR LUMBAR FUSION N/A 01/06/2020   Procedure: Lumbar Five- Sacral One  Anterior lumbar interbody fusion with infuse;  Surgeon: Barnett Abu, MD;  Location: [facility];  Service: Neurosurgery;  Laterality: N/A;  Lumbar Five- Sacral One  Anterior lumbar interbody fusion with infuse   CERVICAL FUSION  1996, 1997   2 surgery- skull to C7   COLON RESECTION  2007   COLONOSCOPY W/ POLYPECTOMY     CORONARY ATHERECTOMY N/A 07/01/2021   Procedure: CORONARY ATHERECTOMY;  Surgeon: Kathleene Hazel, MD;  Location: [facility];  Service: Cardiovascular;  Laterality: N/A;   CORONARY PRESSURE/FFR STUDY N/A 06/18/2021   Procedure:  INTRAVASCULAR PRESSURE WIRE/FFR STUDY;  Surgeon: Kathleene Hazel, MD;  Location: [facility];  Service: Cardiovascular;  Laterality: N/A;   CORONARY STENT INTERVENTION N/A 07/01/2021   Procedure: CORONARY STENT INTERVENTION;  Surgeon: Kathleene Hazel, MD;  Location: [facility];  Service: Cardiovascular;  Laterality: N/A;   CORONARY ULTRASOUND/IVUS N/A 07/01/2021   Procedure: Intravascular Ultrasound/IVUS;  Surgeon: Kathleene Hazel, MD;  Location: [facility];  Service: Cardiovascular;  Laterality: N/A;   HEMORRHOID SURGERY N/A 01/13/2016   Procedure: HEMORRHOIDECTOMY AND REMOVAL OF ANAL SKIN TAG;  Surgeon: Abigail Miyamoto, MD;  Location: [facility];  Service: General;  Laterality: N/A;   REPLACEMENT TOTAL KNEE Left 2005   left   RIGHT/LEFT HEART CATH AND CORONARY ANGIOGRAPHY N/A 06/18/2021   Procedure: RIGHT/LEFT HEART CATH AND CORONARY ANGIOGRAPHY;  Surgeon: Kathleene Hazel, MD;  Location: [facility];  Service: Cardiovascular;  Laterality: N/A;   SYNOVIAL CYST EXCISION     lumbar spine   TONSILLECTOMY     TOTAL KNEE ARTHROPLASTY Right 01/06/2017   Procedure: RIGHT TOTAL KNEE ARTHROPLASTY;  Surgeon: Eugenia Mcalpine, MD;  Location: [facility];  Service: Orthopedics;  Laterality: Right;  Adductior Block   Patient Active Problem List   Diagnosis Date Noted   Pure hypercholesterolemia 06/20/2023   Degenerative  cervical disc 07/07/2021   Coronary artery disease involving native coronary artery of native heart with unstable angina pectoris (HCC)    CAD (coronary artery disease)    Pseudoarthrosis of lumbar spine 01/06/2020   Coronary artery calcification seen on CT scan 01/29/2019   Change in bowel habits 06/12/2018   Primary osteoarthritis of right knee 01/06/2017   S/P knee replacement 01/06/2017   Diplopia 07/10/2013   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       PCP: Tyson Dense MD  REFERRING PROVIDER: Celine Mans  REFERRING DIAG:  Diagnosis  M54.50 (ICD-10-CM) - Lumbar spine pain    Rationale for Evaluation and Treatment: Rehabilitation  THERAPY DIAG:  Chronic bilateral low back pain without sciatica  Other abnormalities of gait and mobility  ONSET DATE: Greater than 20 years  SUBJECTIVE:                                                                                                                                                                                           SUBJECTIVE STATEMENT: The patient has modified his exercises. He feels like it is about the same. It does well for a short time after therapy. We will trial needling today,    Eval:Patient has a significant history of low back and neck pain.  He had a cervical fusion greater than 20 years ago stemming from a significant injury.  He has then had subsequent lower back surgeries over time.  At this time he has significant low back pain.  He feels like he is bending over forward more.  He feels like he cannot stand straight. He fells at this time like he can't walk more then 20 min.   PERTINENT HISTORY:  Multijoint arthritis, A-fib, multiple low back surgeries,  PAIN:  Are you having pain? Yes: NPRS scale: 4-8/10 depending on activity Pain location: Bilateral lower back right greater than left Pain description: Aching Aggravating factors: Standing and walking Relieving factors: Not standing and walking  PRECAUTIONS: None  RED FLAGS: None   WEIGHT BEARING RESTRICTIONS: No  FALLS:  Has patient fallen in last 6 months? 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  Comes to the gym  Monday Wednesday and Friday.  PLOF: Independent  PATIENT GOALS: To have less pain in his back, to be able to walk 1  mile, to be able to stand straighter.  NEXT MD VISIT: Nothing currently scheduled  OBJECTIVE:   DIAGNOSTIC FINDINGS:  Nothing recent or pertinent    SCREENING FOR RED FLAGS: Bowel or bladder incontinence: No Spinal tumors: No Cauda equina syndrome: No Compression fracture: No Abdominal aneurysm: No  COGNITION: Overall cognitive status: Within functional limits for tasks assessed         POSTURE: No Significant postural limitations  PALPATION: Significant tenderness to palpation in bilateral lower lumbar paraspinals and bilateral gluteals  LUMBAR ROM:   AROM eval  Flexion 70  Extension Stands -10 degrees from neutral  Right lateral flexion   Left lateral flexion   Right rotation 25% limited  Left rotation 25% limited   (Blank rows = not tested)  LOWER EXTREMITY ROM:     Passive  Right eval Left eval  Hip flexion    Hip extension    Hip abduction    Hip adduction    Hip internal rotation    Hip external rotation    Knee flexion    Knee extension    Ankle dorsiflexion    Ankle plantarflexion    Ankle inversion    Ankle eversion     (Blank rows = not tested)  LOWER EXTREMITY MMT:    MMT Right eval Left eval Right  12/5  Left 12/5  Hip flexion 35 37.5 35.3 39  Hip extension      Hip abduction 37.3 36.6 35.3 34.6  Hip adduction      Hip internal rotation      Hip external rotation      Knee flexion      Knee extension 46.1 54.7 44.2 50  Ankle dorsiflexion      Ankle plantarflexion      Ankle inversion      Ankle eversion       (Blank rows = not tested)   GAIT: Walks with flexed trunk  TODAY'S TREATMENT:                                                                                                                              DATE:   12/5 Trigger Point Dry-Needling  Treatment instructions: Expect mild to moderate  muscle soreness. S/S of pneumothorax if dry needled over a lung field, and to seek immediate medical attention should they occur. Patient verbalized understanding of these instructions and education.  Patient Consent Given: Yes Education handout provided: Yes Muscles treated: L1-2-3 parapsinals bilateral; bilateral gluteals  Electrical stimulation performed: No Parameters: N/A Treatment response/outcome: great twitch   Reviewed pallof press 3x10 15 lbs  Bilateral chop 3x10 15 lbs  Reviewed chop up bt had mild pain so stopped   Manual manual therapy performed in prone with head down position on table.  Trigger point release to lumbar spine paraspinals, trigger point release to bilateral gluteals gentle  10/2 Trigger Point Dry-Needling  Treatment instructions: Expect mild to moderate muscle soreness. S/S  of pneumothorax if dry needled over a lung field, and to seek immediate medical attention should they occur. Patient verbalized understanding of these instructions and education.  Patient Consent Given: Yes Education handout provided: Yes Muscles treated: L1-2-3 parapsinals bilateral; bilateral gluteals  Electrical stimulation performed: No Parameters: N/A Treatment response/outcome: great twitch   Ball stretch fwd x10 5 sec hold   Shown how to do in the gym as well.   Manual manual therapy performed in prone with head down position on table.  Trigger point release to lumbar spine paraspinals, trigger point release to bilateral gluteals gentle PA mobilizations throughout upper lumbar spine grade 1, supine LAD with grade II occilations   9/27 Manual manual therapy performed in prone with head down position on table.  Trigger point release to lumbar spine paraspinals, trigger point release to bilateral gluteals gentle PA mobilizations throughout lower lumbar spine grade 1, supine LAD x 10 bilateral  Reviewed gym exercises not to do including the back extension   Chop: reviewed  technique 20 lbs 2x10  Pallof press 2x15 15 lbs      Eval: Manual manual therapy performed in prone with head down position on table.  Trigger point release to lumbar spine paraspinals, trigger point release to bilateral gluteals gentle PA mobilizations throughout lower lumbar spine grade 1, supine LAD x 10 bilateral   PATIENT EDUCATION:  Education details: HEP, symptom management, benefits and risks of dry needling. Person educated: Patient Education method: Explanation, Demonstration, Tactile cues, Verbal cues, and Handouts Education comprehension: verbalized understanding, returned demonstration, verbal cues required, tactile cues required, and needs further education  HOME EXERCISE PROGRAM: Will review gym program on future visits  ASSESSMENT:  CLINICAL IMPRESSION: The patient presents today following a fall several weeks ago.  He held on physical therapy secondary to increased pain.  He is now back to the gym.  Prior to his fall he had done well with trigger point dry needling.  He reports following needling he has a significant improvement in ability to stand straight.  Results been transient so far but he is only had 1 trial.  We continue to review his exercise program and modify her exercises do not cause increased pain.  He is advised not to do exercises that put him past neutral extension.  Requested to expand his cable exercises today.  We reviewed treatment options for the cables that do not cause excessive extension.  Overall his measurements are about the same as his initial eval.  He would benefit from further skilled therapy to continue to work on manual therapy and needling.  We will also continue to modify his gym program to make it more beneficial and more effective. Eval: Patient is a 77 year old long history of low back pain.  He has been coming to the gym and working on a program given to him previously by physical therapy.  Despite program he feels like he is having a  progressive loss of ability to ambulate and ability to stand straight.  At this time he can walk less than 150 feet before he has a significant onset of pain.  He has significant spasming in his lower back.  He has difficulty coming to a full extension position.  Following manual therapy today he was much closer to a full extension position.  He was advised to see how long the benefits of manual therapy last for.  It is questionable how long the carryover will last.  Hope is we can combine manual therapy  with his current exercise program to improve overall ability to stand straight.  We will also take a look at his current program and look for potential triggers of pain. OBJECTIVE IMPAIRMENTS: Abnormal gait, decreased activity tolerance, difficulty walking, decreased ROM, improper body mechanics, and pain.   ACTIVITY LIMITATIONS: carrying, lifting, standing, squatting, stairs, transfers, and locomotion level  PARTICIPATION LIMITATIONS: driving, shopping, community activity, occupation, and yard work  PERSONAL FACTORS: 1-2 comorbidities: Spine surgeries A-fib  are also affecting patient's functional outcome.   REHAB POTENTIAL: Excellent  CLINICAL DECISION MAKING: Evolving/moderate complexity  EVALUATION COMPLEXITY: Moderate   GOALS: Goals reviewed with patient? Yes  SHORT TERM GOALS: Target date: 10/19/2023    Patient will increase standing lumbar extension by 5 degrees Baseline: Goal status: Progressing 10/3   2.  Therapy will review gym program and modify exercises that are potential triggers of pain Baseline:  Goal status: Updated 12/5  3.  Patient will report a 25% reduction in pain standing Baseline:  Goal status: Had initially improved but now back to baseline number following fall 2012/5  LONG TERM GOALS: Target date: 10/26/2023    Patient will ambulate 1/2 mile without increased lower back pain in order to perform daily tasks Baseline:  Goal status: Will continue to  monitor and progress towards being able to ambulate half mile 12/5  2.  Patient will perform full gym program without increased low back pain Baseline:  Goal status: INITIAL  3.  Patient will be independent with complete program Baseline:  Goal status: INITIAL   PLAN:  PT FREQUENCY: 2x/week  PT DURATION: 8 weeks  PLANNED INTERVENTIONS: Therapeutic exercises, Therapeutic activity, Neuromuscular re-education, Balance training, Gait training, Patient/Family education, Self Care, Joint mobilization, Stair training, DME instructions, Aquatic Therapy, Dry Needling, Electrical stimulation, Cryotherapy, Moist heat, Taping, Manual therapy, and Re-evaluation. Marland Kitchen  PLAN FOR NEXT SESSION: Review gym program.  Consider tightening patient's range of motion.  If patient feels like he is not getting as much work increased weight.  Continue with manual therapy to lumbar spine.  Consider trigger point dry needling.   Dessie Coma, PT 11/23/2023, 3:54 PM

## 2023-11-28 ENCOUNTER — Encounter (HOSPITAL_BASED_OUTPATIENT_CLINIC_OR_DEPARTMENT_OTHER): Payer: Self-pay | Admitting: Physical Therapy

## 2023-11-29 ENCOUNTER — Ambulatory Visit (HOSPITAL_BASED_OUTPATIENT_CLINIC_OR_DEPARTMENT_OTHER): Payer: Medicare Other | Admitting: Physical Therapy

## 2023-12-18 ENCOUNTER — Ambulatory Visit (HOSPITAL_BASED_OUTPATIENT_CLINIC_OR_DEPARTMENT_OTHER): Payer: Medicare Other | Admitting: Physical Therapy

## 2023-12-18 ENCOUNTER — Encounter (HOSPITAL_BASED_OUTPATIENT_CLINIC_OR_DEPARTMENT_OTHER): Payer: Self-pay | Admitting: Physical Therapy

## 2023-12-18 DIAGNOSIS — R2689 Other abnormalities of gait and mobility: Secondary | ICD-10-CM | POA: Diagnosis not present

## 2023-12-18 DIAGNOSIS — G8929 Other chronic pain: Secondary | ICD-10-CM | POA: Diagnosis not present

## 2023-12-18 DIAGNOSIS — M545 Low back pain, unspecified: Secondary | ICD-10-CM

## 2023-12-18 DIAGNOSIS — M542 Cervicalgia: Secondary | ICD-10-CM

## 2023-12-18 NOTE — Therapy (Signed)
OUTPATIENT PHYSICAL THERAPY THORACOLUMBAR Progress note    Patient Name: [name] MRN: [id number] DOB:27-Jun-1946, 77 y.o., male Today's Date: 12/19/2023  END OF SESSION:  PT End of Session - 12/19/23 0836     Visit Number 5    Number of Visits 16    Date for PT Re-Evaluation 01/18/24    Authorization Type progress note done on visit 4    PT Start Time 1315    PT Stop Time 1357    PT Time Calculation (min) 42 min    Activity Tolerance Patient tolerated treatment well    Behavior During Therapy [facility] for tasks assessed/performed                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   Paroxysmal atrial flutter (HCC)    Past Surgical History:  Procedure Laterality Date   ABDOMINAL EXPOSURE N/A 01/06/2020   Procedure: ABDOMINAL EXPOSURE;  Surgeon: Larina Earthly, MD;  Location: [facility] OR;  Service: Vascular;  Laterality: N/A;   ANTERIOR LUMBAR FUSION N/A 01/06/2020   Procedure: Lumbar Five- Sacral One  Anterior lumbar interbody fusion with infuse;  Surgeon: Barnett Abu, MD;  Location: [facility] OR;  Service: Neurosurgery;  Laterality: N/A;  Lumbar Five- Sacral One  Anterior lumbar interbody fusion with infuse   CERVICAL FUSION  1996, 1997   2 surgery- skull to C7   COLON RESECTION  2007   COLONOSCOPY W/ POLYPECTOMY     CORONARY ATHERECTOMY N/A 07/01/2021   Procedure: CORONARY ATHERECTOMY;  Surgeon: Kathleene Hazel, MD;  Location: [facility] INVASIVE [facility];  Service: Cardiovascular;  Laterality: N/A;   CORONARY PRESSURE/FFR STUDY N/A 06/18/2021   Procedure:  INTRAVASCULAR PRESSURE WIRE/FFR STUDY;  Surgeon: Kathleene Hazel, MD;  Location: [facility];  Service: Cardiovascular;  Laterality: N/A;   CORONARY STENT INTERVENTION N/A 07/01/2021   Procedure: CORONARY STENT INTERVENTION;  Surgeon: Kathleene Hazel, MD;  Location: [facility];  Service: Cardiovascular;  Laterality: N/A;   CORONARY ULTRASOUND/IVUS N/A 07/01/2021   Procedure: Intravascular Ultrasound/IVUS;  Surgeon: Kathleene Hazel, MD;  Location: [facility];  Service: Cardiovascular;  Laterality: N/A;   HEMORRHOID SURGERY N/A 01/13/2016   Procedure: HEMORRHOIDECTOMY AND REMOVAL OF ANAL SKIN TAG;  Surgeon: Abigail Miyamoto, MD;  Location: [facility] OR;  Service: General;  Laterality: N/A;   REPLACEMENT TOTAL KNEE Left 2005   left   RIGHT/LEFT HEART CATH AND CORONARY ANGIOGRAPHY N/A 06/18/2021   Procedure: RIGHT/LEFT HEART CATH AND CORONARY ANGIOGRAPHY;  Surgeon: Kathleene Hazel, MD;  Location: [facility];  Service: Cardiovascular;  Laterality: N/A;   SYNOVIAL CYST EXCISION     lumbar spine   TONSILLECTOMY     TOTAL KNEE ARTHROPLASTY Right 01/06/2017   Procedure: RIGHT TOTAL KNEE ARTHROPLASTY;  Surgeon: Eugenia Mcalpine, MD;  Location: [facility] ORS;  Service: Orthopedics;  Laterality: Right;  Adductior Block   Patient Active Problem List   Diagnosis Date Noted   Pure hypercholesterolemia 06/20/2023  Degenerative cervical disc 07/07/2021   Coronary artery disease involving native coronary artery of native heart with unstable angina pectoris (HCC)    CAD (coronary artery disease)    Pseudoarthrosis of lumbar spine 01/06/2020   Coronary artery calcification seen on CT scan 01/29/2019   Change in bowel habits 06/12/2018   Primary osteoarthritis of right knee 01/06/2017   S/P knee replacement 01/06/2017   Diplopia 07/10/2013   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       PCP: Tyson Dense MD  REFERRING PROVIDER: Celine Mans  REFERRING DIAG:  Diagnosis  M54.50 (ICD-10-CM) - Lumbar spine pain    Rationale for Evaluation and Treatment: Rehabilitation  THERAPY DIAG:  Chronic bilateral low back pain without sciatica  Other abnormalities of gait and mobility  Cervicalgia  ONSET DATE: Greater than 20 years  SUBJECTIVE:                                                                                                                                                                                           SUBJECTIVE STATEMENT: The patient has had a bad week. His back was bad yesterday. He had difficulty getting out of bed. Itr was slightly better today but still very sore.    Eval:Patient has a significant history of low back and neck pain.  He had a cervical fusion greater than 20 years ago stemming from a significant injury.  He has then had subsequent lower back surgeries over time.  At this time he has significant low back pain.  He feels like he is bending over forward more.  He feels like he cannot stand straight. He fells at this time like he can't walk more then 20 min.   PERTINENT HISTORY:  Multijoint arthritis, A-fib, multiple low back surgeries,  PAIN:  Are you having pain? Yes: NPRS scale 5-610 depending on activity Pain location: Bilateral lower back right greater than left Pain description: Aching Aggravating factors: Standing and walking Relieving factors: Not standing and walking  PRECAUTIONS: None  RED FLAGS: None   WEIGHT BEARING RESTRICTIONS: No  FALLS:  Has patient fallen in last 6 months? 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  Comes  to the gym Monday Wednesday and Friday.  PLOF: Independent  PATIENT GOALS: To have less pain in his back, to be able  to walk 1 mile, to be able to stand straighter.  NEXT MD VISIT: Nothing currently scheduled  OBJECTIVE:   DIAGNOSTIC FINDINGS:  Nothing recent or pertinent    SCREENING FOR RED FLAGS: Bowel or bladder incontinence: No Spinal tumors: No Cauda equina syndrome: No Compression fracture: No Abdominal aneurysm: No  COGNITION: Overall cognitive status: Within functional limits for tasks assessed         POSTURE: No Significant postural limitations  PALPATION: Significant tenderness to palpation in bilateral lower lumbar paraspinals and bilateral gluteals  LUMBAR ROM:   AROM eval  Flexion 70  Extension Stands -10 degrees from neutral  Right lateral flexion   Left lateral flexion   Right rotation 25% limited  Left rotation 25% limited   (Blank rows = not tested)  LOWER EXTREMITY ROM:     Passive  Right eval Left eval  Hip flexion    Hip extension    Hip abduction    Hip adduction    Hip internal rotation    Hip external rotation    Knee flexion    Knee extension    Ankle dorsiflexion    Ankle plantarflexion    Ankle inversion    Ankle eversion     (Blank rows = not tested)  LOWER EXTREMITY MMT:    MMT Right eval Left eval Right  12/5  Left 12/5  Hip flexion 35 37.5 35.3 39  Hip extension      Hip abduction 37.3 36.6 35.3 34.6  Hip adduction      Hip internal rotation      Hip external rotation      Knee flexion      Knee extension 46.1 54.7 44.2 50  Ankle dorsiflexion      Ankle plantarflexion      Ankle inversion      Ankle eversion       (Blank rows = not tested)   GAIT: Walks with flexed trunk  TODAY'S TREATMENT:                                                                                                                              DATE:  12/31 Trigger Point Dry-Needling  Treatment instructions: Expect mild to  moderate muscle soreness. S/S of pneumothorax if dry needled over a lung field, and to seek immediate medical attention should they occur. Patient verbalized understanding of these instructions and education.  Patient Consent Given: Yes Education handout provided: Yes Muscles treated: L1-2-3 parapsinals bilateral; bilateral gluteals  Electrical stimulation performed: No Parameters: N/A Treatment response/outcome: great twitch   Ball roll out fwd and lateral x10 5 sec hold   Ball press 2x10 with cuing for breathing      12/5 Trigger Point Dry-Needling  Treatment instructions: Expect mild to moderate muscle soreness. S/S of pneumothorax if dry needled over a lung field, and to seek immediate medical attention should they occur. Patient verbalized understanding of these instructions and education.  Patient Consent Given: Yes Education handout provided: Yes Muscles treated: L1-2-3 parapsinals bilateral; bilateral gluteals  Electrical stimulation performed: No Parameters: N/A Treatment response/outcome: great twitch   Reviewed pallof press 3x10 15 lbs  Bilateral chop 3x10 15 lbs  Reviewed chop up bt had mild pain so stopped   Manual manual therapy performed in prone with head down position on table.  Trigger point release to lumbar spine paraspinals, trigger point release to bilateral gluteals gentle  10/2 Trigger Point Dry-Needling  Treatment instructions: Expect mild to moderate muscle soreness. S/S of pneumothorax if dry needled over a lung field, and to seek immediate medical attention should they occur. Patient verbalized understanding of these instructions and education.  Patient Consent Given: Yes Education handout provided: Yes Muscles treated: L1-2-3 parapsinals bilateral; bilateral gluteals  Electrical stimulation performed: No Parameters: N/A Treatment response/outcome: great twitch   Ball stretch fwd x10 5 sec hold   Shown how to do in the gym as well.   Manual  manual therapy performed in prone with head down position on table.  Trigger point release to lumbar spine paraspinals, trigger point release to bilateral gluteals gentle PA mobilizations throughout upper lumbar spine grade 1, supine LAD with grade II occilations   9/27 Manual manual therapy performed in prone with head down position on table.  Trigger point release to lumbar spine paraspinals, trigger point release to bilateral gluteals gentle PA mobilizations throughout lower lumbar spine grade 1, supine LAD x 10 bilateral  Reviewed gym exercises not to do including the back extension   Chop: reviewed technique 20 lbs 2x10  Pallof press 2x15 15 lbs      Eval: Manual manual therapy performed in prone with head down position on table.  Trigger point release to lumbar spine paraspinals, trigger point release to bilateral gluteals gentle PA mobilizations throughout lower lumbar spine grade 1, supine LAD x 10 bilateral   PATIENT EDUCATION:  Education details: HEP, symptom management, benefits and risks of dry needling. Person educated: Patient Education method: Explanation, Demonstration, Tactile cues, Verbal cues, and Handouts Education comprehension: verbalized understanding, returned demonstration, verbal cues required, tactile cues required, and needs further education  HOME EXERCISE PROGRAM: Will review gym program on future visits  ASSESSMENT:  CLINICAL IMPRESSION: The patient had a great twitch with all needles today. Following manual therapy he is able to stand straighter. Over the next few weeks we hope to get a few sessions in a row to see if we can bring down the baseline pain level. We reviewed the ball stretch and ball press. He can do those in the gym.    Eval: Patient is a 77 year old long history of low back pain.  He has been coming to the gym and working on a program given to him previously by physical therapy.  Despite program he feels like he is having a progressive  loss of ability to ambulate and ability to stand straight.  At this time he can walk less than 150 feet before he has a significant onset of pain.  He has significant spasming in his lower back.  He has difficulty coming to a full extension position.  Following manual therapy today he was much closer to a full extension position.  He was advised to see how long the benefits of manual therapy last for.  It is questionable how long the carryover will last.  Hope is we can combine manual therapy with his current exercise program to improve overall ability to stand straight.  We will also take a look at his current program and look for potential triggers of pain. OBJECTIVE IMPAIRMENTS: Abnormal gait, decreased activity tolerance, difficulty walking, decreased ROM, improper body mechanics, and pain.   ACTIVITY LIMITATIONS: carrying, lifting, standing, squatting, stairs, transfers, and locomotion level  PARTICIPATION LIMITATIONS: driving, shopping, community activity, occupation, and yard work  PERSONAL FACTORS: 1-2 comorbidities: Spine surgeries A-fib  are also affecting patient's functional outcome.   REHAB POTENTIAL: Excellent  CLINICAL DECISION MAKING: Evolving/moderate complexity  EVALUATION COMPLEXITY: Moderate   GOALS: Goals reviewed with patient? Yes  SHORT TERM GOALS: Target date: 10/19/2023    Patient will increase standing lumbar extension by 5 degrees Baseline: Goal status: Progressing 10/3   2.  Therapy will review gym program and modify exercises that are potential triggers of pain Baseline:  Goal status: Updated 12/5  3.  Patient will report a 25% reduction in pain standing Baseline:  Goal status: Had initially improved but now back to baseline number following fall 2012/5  LONG TERM GOALS: Target date: 10/26/2023    Patient will ambulate 1/2 mile without increased lower back pain in order to perform daily tasks Baseline:  Goal status: Will continue to monitor and  progress towards being able to ambulate half mile 12/5  2.  Patient will perform full gym program without increased low back pain Baseline:  Goal status: INITIAL  3.  Patient will be independent with complete program Baseline:  Goal status: INITIAL   PLAN:  PT FREQUENCY: 2x/week  PT DURATION: 8 weeks  PLANNED INTERVENTIONS: Therapeutic exercises, Therapeutic activity, Neuromuscular re-education, Balance training, Gait training, Patient/Family education, Self Care, Joint mobilization, Stair training, DME instructions, Aquatic Therapy, Dry Needling, Electrical stimulation, Cryotherapy, Moist heat, Taping, Manual therapy, and Re-evaluation. Marland Kitchen  PLAN FOR NEXT SESSION: Review gym program.  Consider tightening patient's range of motion.  If patient feels like he is not getting as much work increased weight.  Continue with manual therapy to lumbar spine.  Consider trigger point dry needling.   Dessie Coma, PT 12/19/2023, 8:38 AM

## 2023-12-19 ENCOUNTER — Encounter (HOSPITAL_BASED_OUTPATIENT_CLINIC_OR_DEPARTMENT_OTHER): Payer: Self-pay | Admitting: Physical Therapy

## 2023-12-26 ENCOUNTER — Ambulatory Visit (HOSPITAL_BASED_OUTPATIENT_CLINIC_OR_DEPARTMENT_OTHER): Payer: Medicare Other | Attending: Family Medicine | Admitting: Physical Therapy

## 2023-12-26 ENCOUNTER — Encounter (HOSPITAL_BASED_OUTPATIENT_CLINIC_OR_DEPARTMENT_OTHER): Payer: Self-pay | Admitting: Physical Therapy

## 2023-12-26 DIAGNOSIS — M545 Low back pain, unspecified: Secondary | ICD-10-CM | POA: Diagnosis not present

## 2023-12-26 DIAGNOSIS — G8929 Other chronic pain: Secondary | ICD-10-CM

## 2023-12-26 DIAGNOSIS — M542 Cervicalgia: Secondary | ICD-10-CM | POA: Diagnosis not present

## 2023-12-26 DIAGNOSIS — R2689 Other abnormalities of gait and mobility: Secondary | ICD-10-CM

## 2023-12-26 NOTE — Therapy (Signed)
 OUTPATIENT PHYSICAL THERAPY THORACOLUMBAR Progress note    Patient Name: [name] MRN: [id number] DOB:02-28-1946, 78 y.o., male Today's Date: 12/26/2023  END OF SESSION:  PT End of Session - 12/26/23 1418     Visit Number 6    Number of Visits 16    Date for PT Re-Evaluation 01/18/24    Authorization Type progress note done on visit 4    PT Start Time 1300    PT Stop Time 1343    PT Time Calculation (min) 43 min    Activity Tolerance Patient tolerated treatment well    Behavior During Therapy [facility] for tasks assessed/performed                 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   Paroxysmal atrial flutter (HCC)    Past Surgical History:  Procedure Laterality Date   ABDOMINAL EXPOSURE N/A 01/06/2020   Procedure: ABDOMINAL EXPOSURE;  Surgeon: Oris Krystal FALCON, MD;  Location: [facility] OR;  Service: Vascular;  Laterality: N/A;   ANTERIOR LUMBAR FUSION N/A 01/06/2020   Procedure: Lumbar Five- Sacral One  Anterior lumbar interbody fusion with infuse;  Surgeon: Colon Shove, MD;  Location: [facility] OR;  Service: Neurosurgery;  Laterality: N/A;  Lumbar Five- Sacral One  Anterior lumbar interbody fusion with infuse   CERVICAL FUSION  1996, 1997   2 surgery- skull to C7   COLON RESECTION  2007   COLONOSCOPY W/ POLYPECTOMY     CORONARY ATHERECTOMY N/A 07/01/2021   Procedure: CORONARY ATHERECTOMY;  Surgeon: Verlin Lonni BIRCH, MD;  Location: [facility] IN[facility];  Service: Cardiovascular;  Laterality: N/A;   CORONARY PRESSURE/FFR STUDY N/A 06/18/2021   Procedure:  INTRAVASCULAR PRESSURE WIRE/FFR STUDY;  Surgeon: Verlin Lonni BIRCH, MD;  Location: [facility];  Service: Cardiovascular;  Laterality: N/A;   CORONARY STENT INTERVENTION N/A 07/01/2021   Procedure: CORONARY STENT INTERVENTION;  Surgeon: Verlin Lonni BIRCH, MD;  Location: [facility];  Service: Cardiovascular;  Laterality: N/A;   CORONARY ULTRASOUND/IVUS N/A 07/01/2021   Procedure: Intravascular Ultrasound/IVUS;  Surgeon: Verlin Lonni BIRCH, MD;  Location: [facility];  Service: Cardiovascular;  Laterality: N/A;   HEMORRHOID SURGERY N/A 01/13/2016   Procedure: HEMORRHOIDECTOMY AND REMOVAL OF ANAL SKIN TAG;  Surgeon: Vicenta Poli, MD;  Location: [facility] OR;  Service: General;  Laterality: N/A;   REPLACEMENT TOTAL KNEE Left 2005   left   RIGHT/LEFT HEART CATH AND CORONARY ANGIOGRAPHY N/A 06/18/2021   Procedure: RIGHT/LEFT HEART CATH AND CORONARY ANGIOGRAPHY;  Surgeon: Verlin Lonni BIRCH, MD;  Location: [facility];  Service: Cardiovascular;  Laterality: N/A;   SYNOVIAL CYST EXCISION     lumbar spine   TONSILLECTOMY     TOTAL KNEE ARTHROPLASTY Right 01/06/2017   Procedure: RIGHT TOTAL KNEE ARTHROPLASTY;  Surgeon: Lamar Collet, MD;  Location: [facility];  Service: Orthopedics;  Laterality: Right;  Adductior Block   Patient Active Problem List   Diagnosis Date Noted   Pure hypercholesterolemia 06/20/2023  Degenerative cervical disc 07/07/2021   Coronary artery disease involving native coronary artery of native heart with unstable angina pectoris (HCC)    CAD (coronary artery disease)    Pseudoarthrosis of lumbar spine 01/06/2020   Coronary artery calcification seen on CT scan 01/29/2019   Change in bowel habits 06/12/2018   Primary osteoarthritis of right knee 01/06/2017   S/P knee replacement 01/06/2017   Diplopia 07/10/2013   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       PCP: Ardell Seen MD  REFERRING PROVIDER: Arthea Claudene HAS  REFERRING DIAG:  Diagnosis  M54.50 (ICD-10-CM) - Lumbar spine pain    Rationale for Evaluation and Treatment: Rehabilitation  THERAPY DIAG:  Chronic bilateral low back pain without sciatica  Other abnormalities of gait and mobility  Cervicalgia  ONSET DATE: Greater than 20 years  SUBJECTIVE:                                                                                                                                                                                           SUBJECTIVE STATEMENT: The patient had 4 to 5 days without pain following last needling session without significant pain.  He reports this week has been doing pretty well.  He has had a subtle increase in pain over the past few days but nothing too bad.  He continues to work on his exercises in the gym.  Eval:Patient has a significant history of low back and neck pain.  He had a cervical fusion greater than 20 years ago stemming from a significant injury.  He has then had subsequent lower back surgeries over time.  At this time he has significant low back pain.  He feels like he is bending over forward more.  He feels like he cannot stand straight. He fells at this time like he can't walk more then 20 min.   PERTINENT HISTORY:  Multijoint arthritis, A-fib, multiple low back surgeries,  PAIN:  Are you having pain? Yes: NPRS scale 5-610 depending on activity Pain location: Bilateral lower back right greater than left Pain description: Aching Aggravating factors: Standing and walking Relieving factors: Not standing and walking  PRECAUTIONS: None  RED FLAGS: None   WEIGHT BEARING RESTRICTIONS: No  FALLS:  Has patient fallen in last 6  months? 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  Comes to the gym Monday Wednesday and Friday.  PLOF: Independent  PATIENT GOALS: To have less pain in his back, to be able to walk 1 mile, to be able to stand straighter.  NEXT MD VISIT: Nothing currently scheduled  OBJECTIVE:   DIAGNOSTIC FINDINGS:  Nothing recent or pertinent    SCREENING FOR RED FLAGS: Bowel or bladder incontinence: No Spinal tumors: No Cauda equina syndrome: No Compression fracture: No Abdominal aneurysm: No  COGNITION: Overall cognitive status: Within functional limits for tasks assessed         POSTURE: No Significant postural limitations  PALPATION: Significant tenderness to palpation in bilateral lower lumbar paraspinals and bilateral gluteals  LUMBAR ROM:   AROM eval  Flexion 70  Extension Stands -10 degrees from neutral  Right lateral flexion   Left lateral flexion   Right rotation 25% limited  Left rotation 25% limited   (Blank rows = not tested)  LOWER EXTREMITY ROM:     Passive  Right eval Left eval  Hip flexion    Hip extension    Hip abduction    Hip adduction    Hip internal rotation    Hip external rotation    Knee flexion    Knee extension    Ankle dorsiflexion    Ankle plantarflexion    Ankle inversion    Ankle eversion     (Blank rows = not tested)  LOWER EXTREMITY MMT:    MMT Right eval Left eval Right  12/5  Left 12/5  Hip flexion 35 37.5 35.3 39  Hip extension      Hip abduction 37.3 36.6 35.3 34.6  Hip adduction      Hip internal rotation      Hip external rotation      Knee flexion      Knee extension 46.1 54.7 44.2 50  Ankle dorsiflexion      Ankle plantarflexion      Ankle inversion      Ankle eversion       (Blank rows = not tested)   GAIT: Walks with flexed trunk  TODAY'S TREATMENT:                                                                                                                               DATE:  1/7 Trigger Point Dry-Needling  Treatment instructions: Expect mild to moderate muscle soreness. S/S of pneumothorax if dry needled over a lung field, and to seek immediate medical attention should they occur. Patient verbalized understanding of these instructions and education.  Patient Consent Given: Yes Education handout provided: Yes Muscles treated: L1-2-3 parapsinals bilateral; bilateral gluteals  Electrical stimulation performed: No Parameters: N/A Treatment response/outcome: great twitch   Ball roll out fwd and lateral x10 5 sec hold   Manual manual therapy performed in prone with head down position on table.  Trigger point release to lumbar spine paraspinals, trigger point release to bilateral gluteals gentle  12/31 Trigger Point Dry-Needling  Treatment instructions: Expect mild to moderate muscle soreness. S/S of pneumothorax if dry needled over a lung field, and to seek immediate medical attention should they occur. Patient verbalized understanding of these instructions and education.  Patient Consent Given: Yes Education handout provided: Yes Muscles treated: L1-2-3 parapsinals bilateral; bilateral gluteals  Electrical stimulation performed: No Parameters: N/A Treatment response/outcome: great twitch   Ball roll out fwd and lateral x10 5 sec hold   Ball press 2x10 with cuing for breathing      12/5 Trigger Point Dry-Needling  Treatment instructions: Expect mild to moderate muscle soreness. S/S of pneumothorax if dry needled over a lung field, and to seek immediate medical attention should they occur. Patient verbalized understanding of these instructions and education.  Patient Consent Given: Yes Education handout provided: Yes Muscles treated: L1-2-3 parapsinals bilateral; bilateral gluteals  Electrical stimulation performed: No Parameters: N/A Treatment response/outcome: great twitch   Reviewed  pallof press 3x10 15 lbs  Bilateral chop 3x10 15 lbs  Reviewed chop up bt had mild pain so stopped   Manual manual therapy performed in prone with head down position on table.  Trigger point release to lumbar spine paraspinals, trigger point release to bilateral gluteals gentle  10/2 Trigger Point Dry-Needling  Treatment instructions: Expect mild to moderate muscle soreness. S/S of pneumothorax if dry needled over a lung field, and to seek immediate medical attention should they occur. Patient verbalized understanding of these instructions and education.  Patient Consent Given: Yes Education handout provided: Yes Muscles treated: L1-2-3 parapsinals bilateral; bilateral gluteals  Electrical stimulation performed: No Parameters: N/A Treatment response/outcome: great twitch   Ball stretch fwd x10 5 sec hold   Shown how to do in the gym as well.   Manual manual therapy performed in prone with head down position on table.  Trigger point release to lumbar spine paraspinals, trigger point release to bilateral gluteals gentle PA mobilizations throughout upper lumbar spine grade 1, supine LAD with grade II occilations   9/27 Manual manual therapy performed in prone with head down position on table.  Trigger point release to lumbar spine paraspinals, trigger point release to bilateral gluteals gentle PA mobilizations throughout lower lumbar spine grade 1, supine LAD x 10 bilateral  Reviewed gym exercises not to do including the back extension   Chop: reviewed technique 20 lbs 2x10  Pallof press 2x15 15 lbs      Eval: Manual manual therapy performed in prone with head down position on table.  Trigger point release to lumbar spine paraspinals, trigger point release to bilateral gluteals gentle PA mobilizations throughout lower lumbar spine grade 1, supine LAD x 10 bilateral   PATIENT EDUCATION:  Education details: HEP, symptom management, benefits and risks of dry needling. Person educated:  Patient Education method: Explanation, Demonstration, Tactile cues, Verbal cues, and Handouts Education comprehension: verbalized understanding, returned demonstration, verbal cues required, tactile cues required, and needs further education  HOME EXERCISE PROGRAM: Will review gym program on future visits  ASSESSMENT:  CLINICAL IMPRESSION: The patient had his second consecutive week of trigger point dry needling.  We hope that he will have continued increasing carryover between sessions.  Will continue to work on ball roll out for improved spinal mobility.  Continues to have significant spasming in his gluteals.  He will continue his independent work in the gym.  He is advised to focus on posterior chain strengthening.   Eval: Patient is a 78 year old long history of low back pain.  He has been coming to the gym and working on a program given to him previously by physical therapy.  Despite program he feels like he is having a progressive loss of ability to ambulate and ability to stand straight.  At this time he can walk less than 150 feet before he has a significant onset of pain.  He has significant spasming in his lower back.  He has difficulty coming to a full extension position.  Following manual therapy today he was much closer to a full extension position.  He was advised to see how long the benefits of manual therapy last for.  It is questionable how long the carryover will last.  Hope is we can combine manual therapy with his current exercise program to improve overall ability to stand straight.  We will also take a look at his current program and look for potential triggers of pain. OBJECTIVE IMPAIRMENTS: Abnormal gait, decreased activity tolerance, difficulty walking, decreased ROM, improper body mechanics, and pain.   ACTIVITY LIMITATIONS: carrying, lifting, standing, squatting, stairs, transfers, and locomotion level  PARTICIPATION LIMITATIONS: driving, shopping, community activity,  occupation, and yard work  PERSONAL FACTORS: 1-2 comorbidities: Spine surgeries A-fib  are also affecting patient's functional outcome.   REHAB POTENTIAL: Excellent  CLINICAL DECISION MAKING: Evolving/moderate complexity  EVALUATION COMPLEXITY: Moderate   GOALS: Goals reviewed with patient? Yes  SHORT TERM GOALS: Target date: 10/19/2023    Patient will increase standing lumbar extension by 5 degrees Baseline: Goal status: Progressing 10/3   2.  Therapy will review gym program and modify exercises that are potential triggers of pain Baseline:  Goal status: Updated 12/5  3.  Patient will report a 25% reduction in pain standing Baseline:  Goal status: Had initially improved but now back to baseline number following fall 2012/5  LONG TERM GOALS: Target date: 10/26/2023    Patient will ambulate 1/2 mile without increased lower back pain in order to perform daily tasks Baseline:  Goal status: Will continue to monitor and progress towards being able to ambulate half mile 12/5  2.  Patient will perform full gym program without increased low back pain Baseline:  Goal status: INITIAL  3.  Patient will be independent with complete program Baseline:  Goal status: INITIAL   PLAN:  PT FREQUENCY: 2x/week  PT DURATION: 8 weeks  PLANNED INTERVENTIONS: Therapeutic exercises, Therapeutic activity, Neuromuscular re-education, Balance training, Gait training, Patient/Family education, Self Care, Joint mobilization, Stair training, DME instructions, Aquatic Therapy, Dry Needling, Electrical stimulation, Cryotherapy, Moist heat, Taping, Manual therapy, and Re-evaluation. SABRA  PLAN FOR NEXT SESSION: Review gym program.  Consider tightening patient's range of motion.  If patient feels like he is not getting as much work increased weight.  Continue with manual therapy to lumbar spine.  Consider trigger point dry needling.   Alm JINNY Don, PT 12/26/2023, 2:19 PM

## 2024-01-01 ENCOUNTER — Encounter (HOSPITAL_BASED_OUTPATIENT_CLINIC_OR_DEPARTMENT_OTHER): Payer: Self-pay | Admitting: Physical Therapy

## 2024-01-01 ENCOUNTER — Ambulatory Visit (HOSPITAL_BASED_OUTPATIENT_CLINIC_OR_DEPARTMENT_OTHER): Payer: Medicare Other | Admitting: Physical Therapy

## 2024-01-01 DIAGNOSIS — G8929 Other chronic pain: Secondary | ICD-10-CM | POA: Diagnosis not present

## 2024-01-01 DIAGNOSIS — R2689 Other abnormalities of gait and mobility: Secondary | ICD-10-CM

## 2024-01-01 DIAGNOSIS — M545 Low back pain, unspecified: Secondary | ICD-10-CM | POA: Diagnosis not present

## 2024-01-01 DIAGNOSIS — M542 Cervicalgia: Secondary | ICD-10-CM | POA: Diagnosis not present

## 2024-01-02 ENCOUNTER — Encounter (HOSPITAL_BASED_OUTPATIENT_CLINIC_OR_DEPARTMENT_OTHER): Payer: Self-pay | Admitting: Physical Therapy

## 2024-01-02 NOTE — Therapy (Signed)
 OUTPATIENT PHYSICAL THERAPY THORACOLUMBAR Progress note    Patient Name: [name] MRN: [id number] DOB:03-12-1946, 78 y.o., male Today's Date: 01/02/2024  END OF SESSION:  PT End of Session - 01/01/24 1216     Visit Number 7    Number of Visits 16    Date for PT Re-Evaluation 01/18/24    Authorization Type progress note done on visit 4    PT Start Time 1145    PT Stop Time 1227    PT Time Calculation (min) 42 min    Activity Tolerance Patient tolerated treatment well    Behavior During Therapy [facility] for tasks assessed/performed                 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   Paroxysmal atrial flutter (HCC)    Past Surgical History:  Procedure Laterality Date   ABDOMINAL EXPOSURE N/A 01/06/2020   Procedure: ABDOMINAL EXPOSURE;  Surgeon: Oris Krystal FALCON, MD;  Location: [facility] OR;  Service: Vascular;  Laterality: N/A;   ANTERIOR LUMBAR FUSION N/A 01/06/2020   Procedure: Lumbar Five- Sacral One  Anterior lumbar interbody fusion with infuse;  Surgeon: Colon Shove, MD;  Location: [facility] OR;  Service: Neurosurgery;  Laterality: N/A;  Lumbar Five- Sacral One  Anterior lumbar interbody fusion with infuse   CERVICAL FUSION  1996, 1997   2 surgery- skull to C7   COLON RESECTION  2007   COLONOSCOPY W/ POLYPECTOMY     CORONARY ATHERECTOMY N/A 07/01/2021   Procedure: CORONARY ATHERECTOMY;  Surgeon: Verlin Lonni BIRCH, MD;  Location: [facility] INVASIVE CV [facility];  Service: Cardiovascular;  Laterality: N/A;   CORONARY PRESSURE/FFR STUDY N/A 06/18/2021   Procedure:  INTRAVASCULAR PRESSURE WIRE/FFR STUDY;  Surgeon: Verlin Lonni BIRCH, MD;  Location: [facility] IN[facility];  Service: Cardiovascular;  Laterality: N/A;   CORONARY STENT INTERVENTION N/A 07/01/2021   Procedure: CORONARY STENT INTERVENTION;  Surgeon: Verlin Lonni BIRCH, MD;  Location: [facility];  Service: Cardiovascular;  Laterality: N/A;   CORONARY ULTRASOUND/IVUS N/A 07/01/2021   Procedure: Intravascular Ultrasound/IVUS;  Surgeon: Verlin Lonni BIRCH, MD;  Location: [facility];  Service: Cardiovascular;  Laterality: N/A;   HEMORRHOID SURGERY N/A 01/13/2016   Procedure: HEMORRHOIDECTOMY AND REMOVAL OF ANAL SKIN TAG;  Surgeon: Vicenta Poli, MD;  Location: [facility] OR;  Service: General;  Laterality: N/A;   REPLACEMENT TOTAL KNEE Left 2005   left   RIGHT/LEFT HEART CATH AND CORONARY ANGIOGRAPHY N/A 06/18/2021   Procedure: RIGHT/LEFT HEART CATH AND CORONARY ANGIOGRAPHY;  Surgeon: Verlin Lonni BIRCH, MD;  Location: [facility];  Service: Cardiovascular;  Laterality: N/A;   SYNOVIAL CYST EXCISION     lumbar spine   TONSILLECTOMY     TOTAL KNEE ARTHROPLASTY Right 01/06/2017   Procedure: RIGHT TOTAL KNEE ARTHROPLASTY;  Surgeon: Lamar Collet, MD;  Location: [facility] ORS;  Service: Orthopedics;  Laterality: Right;  Adductior Block   Patient Active Problem List   Diagnosis Date Noted   Pure hypercholesterolemia 06/20/2023  Degenerative cervical disc 07/07/2021   Coronary artery disease involving native coronary artery of native heart with unstable angina pectoris (HCC)    CAD (coronary artery disease)    Pseudoarthrosis of lumbar spine 01/06/2020   Coronary artery calcification seen on CT scan 01/29/2019   Change in bowel habits 06/12/2018   Primary osteoarthritis of right knee 01/06/2017   S/P knee replacement 01/06/2017   Diplopia 07/10/2013   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       PCP: Ardell Seen MD  REFERRING PROVIDER: Arthea Claudene HAS  REFERRING DIAG:  Diagnosis  M54.50 (ICD-10-CM) - Lumbar spine pain    Rationale for Evaluation and Treatment: Rehabilitation  THERAPY DIAG:  Chronic bilateral low back pain without sciatica  Other abnormalities of gait and mobility  ONSET DATE: Greater than 20 years  SUBJECTIVE:                                                                                                                                                                                           SUBJECTIVE STATEMENT: The patient reports his back is about as good as it can be.  He continues to be able to workout.  He reports after the needling he is sore for about a day.  He reports that he has a few good days.  The back seems to go back to where it was before.  He also reports after the session he feels like he cannot stand straighter for period of time.   Eval:Patient has a significant history of low back and neck pain.  He had a cervical fusion greater than 20 years ago stemming from a significant injury.  He has then had subsequent lower back surgeries over time.  At this time he has significant low back pain.  He feels like he is bending over forward more.  He feels like he cannot stand straight. He fells at this time like he can't walk more then 20 min.   PERTINENT HISTORY:  Multijoint arthritis, A-fib, multiple low back surgeries,  PAIN:  Are you having pain? Yes: NPRS scale 5-610 depending on activity Pain location: Bilateral lower back right greater than left Pain description: Aching Aggravating factors: Standing and walking Relieving factors: Not standing and walking  PRECAUTIONS: None  RED FLAGS: None   WEIGHT BEARING  RESTRICTIONS: No  FALLS:  Has patient fallen in last  6 months? 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  Comes to the gym Monday Wednesday and Friday.  PLOF: Independent  PATIENT GOALS: To have less pain in his back, to be able to walk 1 mile, to be able to stand straighter.  NEXT MD VISIT: Nothing currently scheduled  OBJECTIVE:   DIAGNOSTIC FINDINGS:  Nothing recent or pertinent    SCREENING FOR RED FLAGS: Bowel or bladder incontinence: No Spinal tumors: No Cauda equina syndrome: No Compression fracture: No Abdominal aneurysm: No  COGNITION: Overall cognitive status: Within functional limits for tasks assessed         POSTURE: No Significant postural limitations  PALPATION: Significant tenderness to palpation in bilateral lower lumbar paraspinals and bilateral gluteals  LUMBAR ROM:   AROM eval  Flexion 70  Extension Stands -10 degrees from neutral  Right lateral flexion   Left lateral flexion   Right rotation 25% limited  Left rotation 25% limited   (Blank rows = not tested)  LOWER EXTREMITY ROM:     Passive  Right eval Left eval  Hip flexion    Hip extension    Hip abduction    Hip adduction    Hip internal rotation    Hip external rotation    Knee flexion    Knee extension    Ankle dorsiflexion    Ankle plantarflexion    Ankle inversion    Ankle eversion     (Blank rows = not tested)  LOWER EXTREMITY MMT:    MMT Right eval Left eval Right  12/5  Left 12/5  Hip flexion 35 37.5 35.3 39  Hip extension      Hip abduction 37.3 36.6 35.3 34.6  Hip adduction      Hip internal rotation      Hip external rotation      Knee flexion      Knee extension 46.1 54.7 44.2 50  Ankle dorsiflexion      Ankle plantarflexion      Ankle inversion      Ankle eversion       (Blank rows = not tested)   GAIT: Walks with flexed trunk  TODAY'S TREATMENT:                                                                                                                               DATE:  1/14  Trigger Point Dry-Needling  Treatment instructions: Expect mild to moderate muscle soreness. S/S of pneumothorax if dry needled over a lung field, and to seek immediate medical attention should they occur. Patient verbalized understanding of these instructions and education.  Patient Consent Given: Yes Education handout provided: Yes Muscles treated: L1-2-3 parapsinals bilateral; bilateral gluteals  Electrical stimulation performed: No Parameters: N/A Treatment response/outcome: great twitch   Ball roll out fwd and lateral x10 5 sec hold   Manual manual therapy performed in prone with head  down position on table.  Trigger point release to lumbar spine paraspinals, trigger point release to bilateral gluteals gentle   Paloff press 10 pounds 2 x 10 each side Row 2 x 15 20 pounds Shoulder extension 2 x 15 20 pounds  Reviewed posture and core stabilization for each exercise  1/7 Trigger Point Dry-Needling  Treatment instructions: Expect mild to moderate muscle soreness. S/S of pneumothorax if dry needled over a lung field, and to seek immediate medical attention should they occur. Patient verbalized understanding of these instructions and education.  Patient Consent Given: Yes Education handout provided: Yes Muscles treated: L1-2-3 parapsinals bilateral; bilateral gluteals  Electrical stimulation performed: No Parameters: N/A Treatment response/outcome: great twitch   Ball roll out fwd and lateral x10 5 sec hold   Manual manual therapy performed in prone with head down position on table.  Trigger point release to lumbar spine paraspinals, trigger point release to bilateral gluteals gentle      12/31 Trigger Point Dry-Needling  Treatment instructions: Expect mild to moderate muscle soreness. S/S of pneumothorax if dry needled over a lung field, and to seek immediate  medical attention should they occur. Patient verbalized understanding of these instructions and education.  Patient Consent Given: Yes Education handout provided: Yes Muscles treated: L1-2-3 parapsinals bilateral; bilateral gluteals  Electrical stimulation performed: No Parameters: N/A Treatment response/outcome: great twitch   Ball roll out fwd and lateral x10 5 sec hold   Ball press 2x10 with cuing for breathing      12/5 Trigger Point Dry-Needling  Treatment instructions: Expect mild to moderate muscle soreness. S/S of pneumothorax if dry needled over a lung field, and to seek immediate medical attention should they occur. Patient verbalized understanding of these instructions and education.  Patient Consent Given: Yes Education handout provided: Yes Muscles treated: L1-2-3 parapsinals bilateral; bilateral gluteals  Electrical stimulation performed: No Parameters: N/A Treatment response/outcome: great twitch   Reviewed pallof press 3x10 15 lbs  Bilateral chop 3x10 15 lbs  Reviewed chop up bt had mild pain so stopped   Manual manual therapy performed in prone with head down position on table.  Trigger point release to lumbar spine paraspinals, trigger point release to bilateral gluteals gentle  10/2 Trigger Point Dry-Needling  Treatment instructions: Expect mild to moderate muscle soreness. S/S of pneumothorax if dry needled over a lung field, and to seek immediate medical attention should they occur. Patient verbalized understanding of these instructions and education.  Patient Consent Given: Yes Education handout provided: Yes Muscles treated: L1-2-3 parapsinals bilateral; bilateral gluteals  Electrical stimulation performed: No Parameters: N/A Treatment response/outcome: great twitch   Ball stretch fwd x10 5 sec hold   Shown how to do in the gym as well.   Manual manual therapy performed in prone with head down position on table.  Trigger point release to lumbar  spine paraspinals, trigger point release to bilateral gluteals gentle PA mobilizations throughout upper lumbar spine grade 1, supine LAD with grade II occilations   9/27 Manual manual therapy performed in prone with head down position on table.  Trigger point release to lumbar spine paraspinals, trigger point release to bilateral gluteals gentle PA mobilizations throughout lower lumbar spine grade 1, supine LAD x 10 bilateral  Reviewed gym exercises not to do including the back extension   Chop: reviewed technique 20 lbs 2x10  Pallof press 2x15 15 lbs      Eval: Manual manual therapy performed in prone with head down position on table.  Trigger  point release to lumbar spine paraspinals, trigger point release to bilateral gluteals gentle PA mobilizations throughout lower lumbar spine grade 1, supine LAD x 10 bilateral   PATIENT EDUCATION:  Education details: HEP, symptom management, benefits and risks of dry needling. Person educated: Patient Education method: Explanation, Demonstration, Tactile cues, Verbal cues, and Handouts Education comprehension: verbalized understanding, returned demonstration, verbal cues required, tactile cues required, and needs further education  HOME EXERCISE PROGRAM: Will review gym program on future visits  ASSESSMENT:  CLINICAL IMPRESSION: The patient continues to demonstrate significant improvement in ability to stand straight following trigger point dry needling.  At this point we have not shown much carryover but we have not had many consistent weeks in a row to see if we will have carryover.  He is advised to continue his posterior chain strengthening.  Is also advised to continue working on his cardiovascular exercise.  Therapy will continue to progress as tolerated.See below for goal specific progress    Eval: Patient is a 78 year old long history of low back pain.  He has been coming to the gym and working on a program given to him previously by  physical therapy.  Despite program he feels like he is having a progressive loss of ability to ambulate and ability to stand straight.  At this time he can walk less than 150 feet before he has a significant onset of pain.  He has significant spasming in his lower back.  He has difficulty coming to a full extension position.  Following manual therapy today he was much closer to a full extension position.  He was advised to see how long the benefits of manual therapy last for.  It is questionable how long the carryover will last.  Hope is we can combine manual therapy with his current exercise program to improve overall ability to stand straight.  We will also take a look at his current program and look for potential triggers of pain. OBJECTIVE IMPAIRMENTS: Abnormal gait, decreased activity tolerance, difficulty walking, decreased ROM, improper body mechanics, and pain.   ACTIVITY LIMITATIONS: carrying, lifting, standing, squatting, stairs, transfers, and locomotion level  PARTICIPATION LIMITATIONS: driving, shopping, community activity, occupation, and yard work  PERSONAL FACTORS: 1-2 comorbidities: Spine surgeries A-fib  are also affecting patient's functional outcome.   REHAB POTENTIAL: Excellent  CLINICAL DECISION MAKING: Evolving/moderate complexity  EVALUATION COMPLEXITY: Moderate   GOALS: Goals reviewed with patient? Yes  SHORT TERM GOALS: Target date: 10/19/2023    Patient will increase standing lumbar extension by 5 degrees Baseline: Goal status: Progressing 10/3   2.  Therapy will review gym program and modify exercises that are potential triggers of pain Baseline:  Goal status: Updated 12/5  3.  Patient will report a 25% reduction in pain standing Baseline:  Goal status: Had initially improved but now back to baseline number following fall 2012/5  LONG TERM GOALS: Target date: 10/26/2023    Patient will ambulate 1/2 mile without increased lower back pain in order to  perform daily tasks Baseline:  Goal status: Can walk about 1/4 mile at this time 1/14  2.  Patient will perform full gym program without increased low back pain Baseline:  Goal status: Continue to work on his exercise program 1/14  3.  Patient will be independent with complete program Baseline:  Goal status: INITIAL   PLAN:  PT FREQUENCY: 2x/week  PT DURATION: 8 weeks  PLANNED INTERVENTIONS: Therapeutic exercises, Therapeutic activity, Neuromuscular re-education, Balance training, Gait training, Patient/Family education, Self  Care, Joint mobilization, Stair training, DME instructions, Aquatic Therapy, Dry Needling, Electrical stimulation, Cryotherapy, Moist heat, Taping, Manual therapy, and Re-evaluation. SABRA  PLAN FOR NEXT SESSION: Review gym program.  Consider tightening patient's range of motion.  If patient feels like he is not getting as much work increased weight.  Continue with manual therapy to lumbar spine.  Consider trigger point dry needling.   Alm JINNY Don, PT 01/02/2024, 8:59 AM

## 2024-01-12 ENCOUNTER — Other Ambulatory Visit: Payer: Self-pay

## 2024-01-12 ENCOUNTER — Other Ambulatory Visit (HOSPITAL_BASED_OUTPATIENT_CLINIC_OR_DEPARTMENT_OTHER): Payer: Self-pay

## 2024-01-16 ENCOUNTER — Ambulatory Visit (HOSPITAL_BASED_OUTPATIENT_CLINIC_OR_DEPARTMENT_OTHER): Payer: Medicare Other | Admitting: Physical Therapy

## 2024-01-16 DIAGNOSIS — M542 Cervicalgia: Secondary | ICD-10-CM | POA: Diagnosis not present

## 2024-01-16 DIAGNOSIS — M545 Low back pain, unspecified: Secondary | ICD-10-CM | POA: Diagnosis not present

## 2024-01-16 DIAGNOSIS — G8929 Other chronic pain: Secondary | ICD-10-CM

## 2024-01-16 DIAGNOSIS — R2689 Other abnormalities of gait and mobility: Secondary | ICD-10-CM | POA: Diagnosis not present

## 2024-01-16 NOTE — Therapy (Unsigned)
OUTPATIENT PHYSICAL THERAPY THORACOLUMBAR Progress note    Patient Name: [name] MRN: [id number] DOB:[birth year], 78 y.o., male Today's Date: 01/17/2024  END OF SESSION:  PT End of Session - 01/17/24 1133     Visit Number 8    Number of Visits 16    Date for PT Re-Evaluation 01/18/24    Authorization Type progress note done on visit 4    PT Start Time 1300    PT Stop Time 1335    PT Time Calculation (min) 35 min    Activity Tolerance Patient tolerated treatment well    Behavior During Therapy [facility] for tasks assessed/performed                  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   Paroxysmal atrial flutter (HCC)    Past Surgical History:  Procedure Laterality Date   ABDOMINAL EXPOSURE N/A 01/06/2020   Procedure: ABDOMINAL EXPOSURE;  Surgeon: Larina Earthly, MD;  Location: [facility] OR;  Service: Vascular;  Laterality: N/A;   ANTERIOR LUMBAR FUSION N/A 01/06/2020   Procedure: Lumbar Five- Sacral One  Anterior lumbar interbody fusion with infuse;  Surgeon: Barnett Abu, MD;  Location: [facility] OR;  Service: Neurosurgery;  Laterality: N/A;  Lumbar Five- Sacral One  Anterior lumbar interbody fusion with infuse   CERVICAL FUSION  1996, 1997   2 surgery- skull to C7   COLON RESECTION  2007   COLONOSCOPY W/ POLYPECTOMY     CORONARY ATHERECTOMY N/A 07/01/2021   Procedure: CORONARY ATHERECTOMY;  Surgeon: Kathleene Hazel, MD;  Location: [facility];  Service: Cardiovascular;  Laterality: N/A;   CORONARY PRESSURE/FFR STUDY N/A 06/18/2021   Procedure:  INTRAVASCULAR PRESSURE WIRE/FFR STUDY;  Surgeon: Kathleene Hazel, MD;  Location: [facility];  Service: Cardiovascular;  Laterality: N/A;   CORONARY STENT INTERVENTION N/A 07/01/2021   Procedure: CORONARY STENT INTERVENTION;  Surgeon: Kathleene Hazel, MD;  Location: [facility];  Service: Cardiovascular;  Laterality: N/A;   CORONARY ULTRASOUND/IVUS N/A 07/01/2021   Procedure: Intravascular Ultrasound/IVUS;  Surgeon: Kathleene Hazel, MD;  Location: [facility];  Service: Cardiovascular;  Laterality: N/A;   HEMORRHOID SURGERY N/A 01/13/2016   Procedure: HEMORRHOIDECTOMY AND REMOVAL OF ANAL SKIN TAG;  Surgeon: Abigail Miyamoto, MD;  Location: [facility] OR;  Service: General;  Laterality: N/A;   REPLACEMENT TOTAL KNEE Left 2005   left   RIGHT/LEFT HEART CATH AND CORONARY ANGIOGRAPHY N/A 06/18/2021   Procedure: RIGHT/LEFT HEART CATH AND CORONARY ANGIOGRAPHY;  Surgeon: Kathleene Hazel, MD;  Location: [facility];  Service: Cardiovascular;  Laterality: N/A;   SYNOVIAL CYST EXCISION     lumbar spine   TONSILLECTOMY     TOTAL KNEE ARTHROPLASTY Right 01/06/2017   Procedure: RIGHT TOTAL KNEE ARTHROPLASTY;  Surgeon: Eugenia Mcalpine, MD;  Location: [facility];  Service: Orthopedics;  Laterality: Right;  Adductior Block   Patient Active Problem List   Diagnosis Date Noted   Pure hypercholesterolemia 06/20/2023  Degenerative cervical disc 07/07/2021   Coronary artery disease involving native coronary artery of native heart with unstable angina pectoris (HCC)    CAD (coronary artery disease)    Pseudoarthrosis of lumbar spine 01/06/2020   Coronary artery calcification seen on CT scan 01/29/2019   Change in bowel habits 06/12/2018   Primary osteoarthritis of right knee 01/06/2017   S/P knee replacement 01/06/2017   Diplopia 07/10/2013   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       PCP: Tyson Dense MD  REFERRING PROVIDER: Celine Mans  REFERRING DIAG:  Diagnosis  M54.50 (ICD-10-CM) - Lumbar spine pain    Rationale for Evaluation and Treatment: Rehabilitation  THERAPY DIAG:  Chronic bilateral low back pain without sciatica  Other abnormalities of gait and mobility  Cervicalgia  ONSET DATE: Greater than 20 years  SUBJECTIVE:                                                                                                                                                                                           SUBJECTIVE STATEMENT: The patient reports the back over the past 2 weeks has been about the best that ithas been. He is still in his baseline pain. He does feel more fatigued. He was advised to talk to his MD as he has chrincally low B12   Eval:Patient has a significant history of low back and neck pain.  He had a cervical fusion greater than 20 years ago stemming from a significant injury.  He has then had subsequent lower back surgeries over time.  At this time he has significant low back pain.  He feels like he is bending over forward more.  He feels like he cannot stand straight. He fells at this time like he can't walk more then 20 min.   PERTINENT HISTORY:  Multijoint arthritis, A-fib, multiple low back surgeries,  PAIN:  Are you having pain? Yes: NPRS scale 5-610 depending on activity Pain location: Bilateral lower back right greater than left Pain description: Aching Aggravating factors: Standing and walking Relieving factors: Not standing and walking  PRECAUTIONS: None  RED FLAGS: None   WEIGHT BEARING RESTRICTIONS: No  FALLS:  Has patient fallen in last 6 months? 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  Comes to the gym Monday Wednesday and Friday.  PLOF: Independent  PATIENT GOALS: To have less pain in his back, to be able to walk 1 mile, to be able to stand straighter.  NEXT MD VISIT: Nothing currently scheduled  OBJECTIVE:   DIAGNOSTIC FINDINGS:  Nothing recent or pertinent    SCREENING FOR RED FLAGS: Bowel or bladder incontinence: No Spinal tumors: No Cauda equina syndrome: No Compression fracture: No Abdominal aneurysm: No  COGNITION: Overall cognitive status: Within functional limits for tasks assessed         POSTURE: No Significant postural limitations  PALPATION: Significant tenderness to palpation in bilateral lower lumbar paraspinals and bilateral gluteals  LUMBAR ROM:   AROM eval  Flexion 70  Extension Stands -10 degrees from neutral  Right lateral flexion   Left lateral flexion   Right rotation 25% limited  Left rotation 25% limited   (Blank rows = not tested)  LOWER EXTREMITY ROM:     Passive  Right eval Left eval  Hip flexion    Hip extension    Hip abduction    Hip adduction    Hip internal rotation    Hip external rotation    Knee flexion    Knee extension    Ankle dorsiflexion    Ankle plantarflexion    Ankle inversion    Ankle eversion     (Blank rows = not tested)  LOWER EXTREMITY MMT:    MMT Right eval Left eval Right  12/5  Left 12/5  Hip flexion 35 37.5 35.3 39  Hip extension      Hip abduction 37.3 36.6 35.3 34.6  Hip adduction      Hip internal rotation      Hip external rotation      Knee flexion      Knee extension 46.1 54.7 44.2 50  Ankle dorsiflexion      Ankle plantarflexion      Ankle inversion      Ankle eversion       (Blank rows = not tested)   GAIT: Walks with flexed trunk  TODAY'S TREATMENT:                                                                                                                               DATE:  1/28 [place] Dry-Needling  Treatment instructions: Expect mild to moderate muscle soreness. S/S of pneumothorax if dry needled over a lung field, and to seek immediate medical attention should they occur. Patient verbalized understanding of these instructions and education.  Patient Consent Given: Yes Education handout provided: Yes Muscles treated: L1-2-3 parapsinals bilateral; bilateral gluteals  Electrical stimulation performed: No Parameters: N/A Treatment response/outcome: great twitch   Ball roll out fwd and lateral x10 5 sec hold   Manual manual therapy performed in prone with head down position on table.  Trigger point release to lumbar spine paraspinals, trigger point release to bilateral gluteals gentle   1/14  Trigger Point Dry-Needling  Treatment instructions:  Expect mild to moderate muscle soreness. S/S of pneumothorax if dry needled over a lung field, and to seek immediate medical attention should they occur. Patient verbalized understanding of these instructions and education.  Patient Consent Given: Yes Education handout provided: Yes Muscles treated: L1-2-3 parapsinals bilateral; bilateral gluteals  Electrical stimulation performed: No Parameters: N/A Treatment response/outcome: great twitch   Ball roll out fwd and lateral x10 5 sec hold   Manual manual therapy performed in prone with head down position on table.  Trigger point release to lumbar spine paraspinals, trigger point release to bilateral gluteals gentle   Paloff press 10 pounds 2 x 10 each side Row 2 x 15 20 pounds Shoulder extension 2 x 15 20 pounds  Reviewed posture and core stabilization for each exercise  1/7 Trigger Point Dry-Needling  Treatment instructions: Expect mild to moderate muscle soreness. S/S of pneumothorax if dry needled over a lung field, and to seek immediate medical attention should they occur. Patient verbalized understanding of these instructions and  education.  Patient Consent Given: Yes Education handout provided: Yes Muscles treated: L1-2-3 parapsinals bilateral; bilateral gluteals  Electrical stimulation performed: No Parameters: N/A Treatment response/outcome: great twitch   Ball roll out fwd and lateral x10 5 sec hold   Manual manual therapy performed in prone with head down position on table.  Trigger point release to lumbar spine paraspinals, trigger point release to bilateral gluteals gentle      12/31 Trigger Point Dry-Needling  Treatment instructions: Expect mild to moderate muscle soreness. S/S of pneumothorax if dry needled over a lung field, and to seek immediate medical attention should they occur. Patient verbalized understanding of these instructions and education.  Patient Consent Given: Yes Education handout provided: Yes Muscles treated: L1-2-3 parapsinals bilateral; bilateral gluteals  Electrical stimulation performed: No Parameters: N/A Treatment response/outcome: great twitch   Ball roll out fwd and lateral x10 5 sec hold   Ball press 2x10 with cuing for breathing      12/5 Trigger Point Dry-Needling  Treatment instructions: Expect mild to moderate muscle soreness. S/S of pneumothorax if dry needled over a lung field, and to seek immediate medical attention should they occur. Patient verbalized understanding of these instructions and education.  Patient Consent Given: Yes Education handout provided: Yes Muscles treated: L1-2-3 parapsinals bilateral; bilateral gluteals  Electrical stimulation performed: No Parameters: N/A Treatment response/outcome: great twitch   Reviewed pallof press 3x10 15 lbs  Bilateral chop 3x10 15 lbs  Reviewed chop up bt had mild pain so stopped   Manual manual therapy performed in prone with head down position on table.  Trigger point release to lumbar spine paraspinals, trigger point release to bilateral gluteals gentle  10/2 Trigger Point Dry-Needling  Treatment  instructions: Expect mild to moderate muscle soreness. S/S of pneumothorax if dry needled over a lung field, and to seek immediate medical attention should they occur. Patient verbalized understanding of these instructions and education.  Patient Consent Given: Yes Education handout provided: Yes Muscles treated: L1-2-3 parapsinals bilateral; bilateral gluteals  Electrical stimulation performed: No Parameters: N/A Treatment response/outcome: great twitch   Ball stretch fwd x10 5 sec hold   Shown how to do in the gym as well.   Manual manual therapy performed in prone with head down position on table.  Trigger point release to lumbar spine paraspinals, trigger point release to bilateral gluteals gentle PA mobilizations throughout upper lumbar spine grade 1, supine LAD with grade II occilations   9/27 Manual manual therapy  performed in prone with head down position on table.  Trigger point release to lumbar spine paraspinals, trigger point release to bilateral gluteals gentle PA mobilizations throughout lower lumbar spine grade 1, supine LAD x 10 bilateral  Reviewed gym exercises not to do including the back extension   Chop: reviewed technique 20 lbs 2x10  Pallof press 2x15 15 lbs      Eval: Manual manual therapy performed in prone with head down position on table.  Trigger point release to lumbar spine paraspinals, trigger point release to bilateral gluteals gentle PA mobilizations throughout lower lumbar spine grade 1, supine LAD x 10 bilateral   PATIENT EDUCATION:  Education details: HEP, symptom management, benefits and risks of dry needling. Person educated: Patient Education method: Explanation, Demonstration, Tactile cues, Verbal cues, and Handouts Education comprehension: verbalized understanding, returned demonstration, verbal cues required, tactile cues required, and needs further education  HOME EXERCISE PROGRAM: Will review gym program on future  visits  ASSESSMENT:  CLINICAL IMPRESSION: Therapy focused on manual therapy today and needling. He was able to stand straighter following his session. 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 He will talk to his MD about his fatigue. Therapy will continue to progress.  He has been doing his exercises independently.   Eval: Patient is a 78 year old long history of low back pain.  He has been coming to the gym and working on a program given to him previously by physical therapy.  Despite program he feels like he is having a progressive loss of ability to ambulate and ability to stand straight.  At this time he can walk less than 150 feet before he has a significant onset of pain.  He has significant spasming in his lower back.  He has difficulty coming to a full extension position.  Following manual therapy today he was much closer to a full extension position.  He was advised to see how long the benefits of manual therapy last for.  It is questionable how long the carryover will last.  Hope is we can combine manual therapy with his current exercise program to improve overall ability to stand straight.  We will also take a look at his current program and look for potential triggers of pain. OBJECTIVE IMPAIRMENTS: Abnormal gait, decreased activity tolerance, difficulty walking, decreased ROM, improper body mechanics, and pain.   ACTIVITY LIMITATIONS: carrying, lifting, standing, squatting, stairs, transfers, and locomotion level  PARTICIPATION LIMITATIONS: driving, shopping, community activity, occupation, and yard work  PERSONAL FACTORS: 1-2 comorbidities: Spine surgeries A-fib  are also affecting patient's functional outcome.   REHAB POTENTIAL: Excellent  CLINICAL DECISION MAKING: Evolving/moderate complexity  EVALUATION COMPLEXITY: Moderate   GOALS: Goals reviewed with patient? Yes  SHORT TERM GOALS: Target date: 10/19/2023    Patient will increase standing lumbar extension by 5  degrees Baseline: Goal status: Progressing 10/3   2.  Therapy will review gym program and modify exercises that are potential triggers of pain Baseline:  Goal status: Updated 12/5  3.  Patient will report a 25% reduction in pain standing Baseline:  Goal status: Had initially improved but now back to baseline number following fall 2012/5  LONG TERM GOALS: Target date: 10/26/2023    Patient will ambulate 1/2 mile without increased lower back pain in order to perform daily tasks Baseline:  Goal status: Can walk about 1/4 mile at this time 1/14  2.  Patient will perform full gym program without increased low back pain Baseline:  Goal status: Continue to  work on his exercise program 1/14  3.  Patient will be independent with complete program Baseline:  Goal status: INITIAL   PLAN:  PT FREQUENCY: 2x/week  PT DURATION: 8 weeks  PLANNED INTERVENTIONS: Therapeutic exercises, Therapeutic activity, Neuromuscular re-education, Balance training, Gait training, Patient/Family education, Self Care, Joint mobilization, Stair training, DME instructions, Aquatic Therapy, Dry Needling, Electrical stimulation, Cryotherapy, Moist heat, Taping, Manual therapy, and Re-evaluation. Marland Kitchen  PLAN FOR NEXT SESSION: Review gym program.  Consider tightening patient's range of motion.  If patient feels like he is not getting as much work increased weight.  Continue with manual therapy to lumbar spine.  Consider trigger point dry needling.   Dessie Coma, PT 01/17/2024, 11:34 AM

## 2024-01-17 ENCOUNTER — Other Ambulatory Visit (HOSPITAL_BASED_OUTPATIENT_CLINIC_OR_DEPARTMENT_OTHER): Payer: Self-pay

## 2024-01-17 ENCOUNTER — Encounter (HOSPITAL_BASED_OUTPATIENT_CLINIC_OR_DEPARTMENT_OTHER): Payer: Self-pay | Admitting: Physical Therapy

## 2024-01-18 ENCOUNTER — Other Ambulatory Visit (HOSPITAL_BASED_OUTPATIENT_CLINIC_OR_DEPARTMENT_OTHER): Payer: Self-pay

## 2024-01-23 ENCOUNTER — Other Ambulatory Visit: Payer: Self-pay | Admitting: Internal Medicine

## 2024-01-26 ENCOUNTER — Encounter (HOSPITAL_BASED_OUTPATIENT_CLINIC_OR_DEPARTMENT_OTHER): Payer: Self-pay | Admitting: Physical Therapy

## 2024-01-26 ENCOUNTER — Ambulatory Visit (HOSPITAL_BASED_OUTPATIENT_CLINIC_OR_DEPARTMENT_OTHER): Payer: Medicare Other | Attending: Family Medicine | Admitting: Physical Therapy

## 2024-01-26 DIAGNOSIS — G8929 Other chronic pain: Secondary | ICD-10-CM | POA: Diagnosis not present

## 2024-01-26 DIAGNOSIS — R2689 Other abnormalities of gait and mobility: Secondary | ICD-10-CM | POA: Insufficient documentation

## 2024-01-26 DIAGNOSIS — M542 Cervicalgia: Secondary | ICD-10-CM | POA: Diagnosis not present

## 2024-01-26 DIAGNOSIS — M545 Low back pain, unspecified: Secondary | ICD-10-CM | POA: Insufficient documentation

## 2024-01-26 NOTE — Therapy (Signed)
 OUTPATIENT PHYSICAL THERAPY THORACOLUMBAR Progress note    Patient Name: [name] MRN: [id number] DOB:[birth year], 78 y.o., male Today's Date: 01/26/2024  END OF SESSION:  PT End of Session - 01/26/24 1338     Visit Number 9    Number of Visits 16    Date for PT Re-Evaluation 01/18/24    Authorization Type progress note done on visit 4    PT Start Time 1145    PT Stop Time 1225    PT Time Calculation (min) 40 min    Activity Tolerance Patient tolerated treatment well    Behavior During Therapy [facility] for tasks assessed/performed                   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   Paroxysmal atrial flutter (HCC)    Past Surgical History:  Procedure Laterality Date   ABDOMINAL EXPOSURE N/A 01/06/2020   Procedure: ABDOMINAL EXPOSURE;  Surgeon: Oris Krystal FALCON, MD;  Location: [facility] OR;  Service: Vascular;  Laterality: N/A;   ANTERIOR LUMBAR FUSION N/A 01/06/2020   Procedure: Lumbar Five- Sacral One  Anterior lumbar interbody fusion with infuse;  Surgeon: Colon Shove, MD;  Location: [facility] OR;  Service: Neurosurgery;  Laterality: N/A;  Lumbar Five- Sacral One  Anterior lumbar interbody fusion with infuse   CERVICAL FUSION  1996, 1997   2 surgery- skull to C7   COLON RESECTION  2007   COLONOSCOPY W/ POLYPECTOMY     CORONARY ATHERECTOMY N/A 07/01/2021   Procedure: CORONARY ATHERECTOMY;  Surgeon: Verlin Lonni BIRCH, MD;  Location: [facility] INVASIVE CV [facility];  Service: Cardiovascular;  Laterality: N/A;   CORONARY PRESSURE/FFR STUDY N/A 06/18/2021   Procedure:  INTRAVASCULAR PRESSURE WIRE/FFR STUDY;  Surgeon: Verlin Lonni BIRCH, MD;  Location: [facility];  Service: Cardiovascular;  Laterality: N/A;   CORONARY STENT INTERVENTION N/A 07/01/2021   Procedure: CORONARY STENT INTERVENTION;  Surgeon: Verlin Lonni BIRCH, MD;  Location: [facility];  Service: Cardiovascular;  Laterality: N/A;   CORONARY ULTRASOUND/IVUS N/A 07/01/2021   Procedure: Intravascular Ultrasound/IVUS;  Surgeon: Verlin Lonni BIRCH, MD;  Location: [facility];  Service: Cardiovascular;  Laterality: N/A;   HEMORRHOID SURGERY N/A 01/13/2016   Procedure: HEMORRHOIDECTOMY AND REMOVAL OF ANAL SKIN TAG;  Surgeon: Vicenta Poli, MD;  Location: [facility] OR;  Service: General;  Laterality: N/A;   REPLACEMENT TOTAL KNEE Left 2005   left   RIGHT/LEFT HEART CATH AND CORONARY ANGIOGRAPHY N/A 06/18/2021   Procedure: RIGHT/LEFT HEART CATH AND CORONARY ANGIOGRAPHY;  Surgeon: Verlin Lonni BIRCH, MD;  Location: [facility];  Service: Cardiovascular;  Laterality: N/A;   SYNOVIAL CYST EXCISION     lumbar spine   TONSILLECTOMY     TOTAL KNEE ARTHROPLASTY Right 01/06/2017   Procedure: RIGHT TOTAL KNEE ARTHROPLASTY;  Surgeon: Lamar Collet, MD;  Location: [facility] ORS;  Service: Orthopedics;  Laterality: Right;  Adductior Block   Patient Active Problem List   Diagnosis Date Noted   Pure hypercholesterolemia  06/20/2023   Degenerative cervical disc 07/07/2021   Coronary artery disease involving native coronary artery of native heart with unstable angina pectoris (HCC)    CAD (coronary artery disease)    Pseudoarthrosis of lumbar spine 01/06/2020   Coronary artery calcification seen on CT scan 01/29/2019   Change in bowel habits 06/12/2018   Primary osteoarthritis of right knee 01/06/2017   S/P knee replacement 01/06/2017   Diplopia 07/10/2013   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       PCP: Ardell Seen MD  REFERRING PROVIDER: Arthea Claudene HAS  REFERRING DIAG:  Diagnosis  M54.50 (ICD-10-CM) - Lumbar spine pain    Rationale for Evaluation and Treatment: Rehabilitation  THERAPY DIAG:  Chronic bilateral low back pain without sciatica  Other abnormalities of gait and mobility  Cervicalgia  ONSET DATE: Greater than 20 years  SUBJECTIVE:                                                                                                                                                                                           The patient overall feels like his back is the best it has been in a few years.  He is having less pain in the knees had.  He has felt recently like he has whole days where his legs are very weak.  He is having a day like that today.  He feels like weakness in bilateral lower extremities.  He continues to go to the gym frequently    eval:Patient has a significant history of low back and neck pain.  He had a cervical fusion greater than 20 years ago stemming from a significant injury.  He has then had subsequent lower back surgeries over time.  At this time he has significant low back pain.  He feels like he is bending over forward more.  He feels like he cannot stand straight. He fells at this time like he can't walk more then 20 min.   PERTINENT HISTORY:  Multijoint arthritis, A-fib, multiple low back surgeries,  PAIN:  Are you having pain? Yes: NPRS scale 5-610 depending on activity Pain location: Bilateral lower back right greater than left Pain description: Aching Aggravating factors: Standing and walking Relieving factors: Not standing and walking  PRECAUTIONS: None  RED FLAGS: None   WEIGHT BEARING RESTRICTIONS: No  FALLS:   Has patient fallen in last  6 months? 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  Comes to the gym Monday Wednesday and Friday.  PLOF: Independent  PATIENT GOALS: To have less pain in his back, to be able to walk 1 mile, to be able to stand straighter.  NEXT MD VISIT: Nothing currently scheduled  OBJECTIVE:   DIAGNOSTIC FINDINGS:  Nothing recent or pertinent    SCREENING FOR RED FLAGS: Bowel or bladder incontinence: No Spinal tumors: No Cauda equina syndrome: No Compression fracture: No Abdominal aneurysm: No  COGNITION: Overall cognitive status: Within functional limits for tasks assessed         POSTURE: No Significant postural limitations  PALPATION: Significant tenderness to palpation in bilateral lower lumbar paraspinals and bilateral gluteals  LUMBAR ROM:   AROM eval  Flexion 70  Extension Stands -10 degrees from neutral  Right lateral flexion   Left lateral flexion   Right rotation 25% limited  Left rotation 25% limited   (Blank rows = not tested)  LOWER EXTREMITY ROM:     Passive  Right eval Left eval  Hip flexion    Hip extension    Hip abduction    Hip adduction    Hip internal rotation    Hip external rotation    Knee flexion    Knee extension    Ankle dorsiflexion    Ankle plantarflexion    Ankle inversion    Ankle eversion     (Blank rows = not tested)  LOWER EXTREMITY MMT:    MMT Right eval Left eval Right  12/5  Left 12/5  Hip flexion 35 37.5 35.3 39  Hip extension      Hip abduction 37.3 36.6 35.3 34.6  Hip adduction      Hip internal rotation      Hip external rotation      Knee flexion      Knee extension 46.1 54.7 44.2 50  Ankle dorsiflexion      Ankle plantarflexion      Ankle inversion      Ankle eversion       (Blank rows = not tested)   GAIT: Walks with flexed trunk  TODAY'S TREATMENT:                                                                                                                               DATE:  2/7 Trigger Point Dry-Needling  Treatment instructions: Expect mild to moderate muscle soreness. S/S of pneumothorax if dry needled over a lung field, and to seek immediate medical attention should they occur. Patient verbalized understanding of these instructions and education.  Patient Consent Given: Yes Education handout provided: Yes Muscles treated: L1-2-3 parapsinals bilateral; bilateral gluteals  Electrical stimulation performed: No Parameters: N/A Treatment response/outcome: great twitch   Ball roll out fwd and lateral x10 5 sec hold  Ball press 3x15    Manual manual therapy performed  in prone with head down position on table.  Trigger point release to lumbar spine paraspinals, trigger point release to bilateral gluteals gentle     1/28 rigger Point Dry-Needling  Treatment instructions: Expect mild to moderate muscle soreness. S/S of pneumothorax if dry needled over a lung field, and to seek immediate medical attention should they occur. Patient verbalized understanding of these instructions and education.  Patient Consent Given: Yes Education handout provided: Yes Muscles treated: L1-2-3 parapsinals bilateral; bilateral gluteals  Electrical stimulation performed: No Parameters: N/A Treatment response/outcome: great twitch   Ball roll out fwd and lateral x10 5 sec hold   Manual manual therapy performed in prone with head down position on table.  Trigger point release to lumbar spine paraspinals, trigger point release to bilateral gluteals gentle   1/14  Trigger Point Dry-Needling  Treatment instructions: Expect mild to moderate muscle soreness. S/S of pneumothorax if dry needled over a lung field, and to seek immediate medical attention should they occur. Patient verbalized understanding of these instructions and education.  Patient Consent Given: Yes Education handout provided:  Yes Muscles treated: L1-2-3 parapsinals bilateral; bilateral gluteals  Electrical stimulation performed: No Parameters: N/A Treatment response/outcome: great twitch   Ball roll out fwd and lateral x10 5 sec hold   Manual manual therapy performed in prone with head down position on table.  Trigger point release to lumbar spine paraspinals, trigger point release to bilateral gluteals gentle   Paloff press 10 pounds 2 x 10 each side Row 2 x 15 20 pounds Shoulder extension 2 x 15 20 pounds  Reviewed posture and core stabilization for each exercise  1/7 Trigger Point Dry-Needling  Treatment instructions: Expect mild to moderate muscle soreness. S/S of pneumothorax if dry needled over a lung field, and to seek immediate medical attention should they occur. Patient verbalized understanding of these instructions and education.  Patient Consent Given: Yes Education handout provided: Yes Muscles treated: L1-2-3 parapsinals bilateral; bilateral gluteals  Electrical stimulation performed: No Parameters: N/A Treatment response/outcome: great twitch   Ball roll out fwd and lateral x10 5 sec hold   Manual manual therapy performed in prone with head down position on table.  Trigger point release to lumbar spine paraspinals, trigger point release to bilateral gluteals gentle      12/31 Trigger Point Dry-Needling  Treatment instructions: Expect mild to moderate muscle soreness. S/S of pneumothorax if dry needled over a lung field, and to seek immediate medical attention should they occur. Patient verbalized understanding of these instructions and education.  Patient Consent Given: Yes Education handout provided: Yes Muscles treated: L1-2-3 parapsinals bilateral; bilateral gluteals  Electrical stimulation performed: No Parameters: N/A Treatment response/outcome: great twitch   Ball roll out fwd and lateral x10 5 sec hold   Ball press 2x10 with cuing for breathing      12/5 Trigger  Point Dry-Needling  Treatment instructions: Expect mild to moderate muscle soreness. S/S of pneumothorax if dry needled over a lung field, and to seek immediate medical attention should they occur. Patient verbalized understanding of these instructions and education.  Patient Consent Given: Yes Education handout provided: Yes Muscles treated: L1-2-3 parapsinals bilateral; bilateral gluteals  Electrical stimulation performed: No Parameters: N/A Treatment response/outcome: great twitch   Reviewed pallof press 3x10 15 lbs  Bilateral chop 3x10 15 lbs  Reviewed chop up bt had mild pain so stopped   Manual manual therapy performed in prone with head down position on table.  Trigger point release to lumbar spine  paraspinals, trigger point release to bilateral gluteals gentle  10/2 Trigger Point Dry-Needling  Treatment instructions: Expect mild to moderate muscle soreness. S/S of pneumothorax if dry needled over a lung field, and to seek immediate medical attention should they occur. Patient verbalized understanding of these instructions and education.  Patient Consent Given: Yes Education handout provided: Yes Muscles treated: L1-2-3 parapsinals bilateral; bilateral gluteals  Electrical stimulation performed: No Parameters: N/A Treatment response/outcome: great twitch   Ball stretch fwd x10 5 sec hold   Shown how to do in the gym as well.   Manual manual therapy performed in prone with head down position on table.  Trigger point release to lumbar spine paraspinals, trigger point release to bilateral gluteals gentle PA mobilizations throughout upper lumbar spine grade 1, supine LAD with grade II occilations   9/27 Manual manual therapy performed in prone with head down position on table.  Trigger point release to lumbar spine paraspinals, trigger point release to bilateral gluteals gentle PA mobilizations throughout lower lumbar spine grade 1, supine LAD x 10 bilateral  Reviewed gym  exercises not to do including the back extension   Chop: reviewed technique 20 lbs 2x10  Pallof press 2x15 15 lbs      Eval: Manual manual therapy performed in prone with head down position on table.  Trigger point release to lumbar spine paraspinals, trigger point release to bilateral gluteals gentle PA mobilizations throughout lower lumbar spine grade 1, supine LAD x 10 bilateral   PATIENT EDUCATION:  Education details: HEP, symptom management, benefits and risks of dry needling. Person educated: Patient Education method: Explanation, Demonstration, Tactile cues, Verbal cues, and Handouts Education comprehension: verbalized understanding, returned demonstration, verbal cues required, tactile cues required, and needs further education  HOME EXERCISE PROGRAM: Will review gym program on future visits  ASSESSMENT:  CLINICAL IMPRESSION: Therapy focused on manual therapy today and needling. He was able to stand straighter following his session. 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 He will talk to his MD about his fatigue. Therapy will continue to progress.  He has been doing his exercises independently.   Eval: Patient is a 78 year old long history of low back pain.  He has been coming to the gym and working on a program given to him previously by physical therapy.  Despite program he feels like he is having a progressive loss of ability to ambulate and ability to stand straight.  At this time he can walk less than 150 feet before he has a significant onset of pain.  He has significant spasming in his lower back.  He has difficulty coming to a full extension position.  Following manual therapy today he was much closer to a full extension position.  He was advised to see how long the benefits of manual therapy last for.  It is questionable how long the carryover will last.  Hope is we can combine manual therapy with his current exercise program to improve overall ability to stand straight.   We will also take a look at his current program and look for potential triggers of pain. OBJECTIVE IMPAIRMENTS: Abnormal gait, decreased activity tolerance, difficulty walking, decreased ROM, improper body mechanics, and pain.   ACTIVITY LIMITATIONS: carrying, lifting, standing, squatting, stairs, transfers, and locomotion level  PARTICIPATION LIMITATIONS: driving, shopping, community activity, occupation, and yard work  PERSONAL FACTORS: 1-2 comorbidities: Spine surgeries A-fib  are also affecting patient's functional outcome.   REHAB POTENTIAL: Excellent  CLINICAL DECISION MAKING: Evolving/moderate complexity  EVALUATION  COMPLEXITY: Moderate   GOALS: Goals reviewed with patient? Yes  SHORT TERM GOALS: Target date: 10/19/2023    Patient will increase standing lumbar extension by 5 degrees Baseline: Goal status: Progressing 10/3   2.  Therapy will review gym program and modify exercises that are potential triggers of pain Baseline:  Goal status: Updated 12/5  3.  Patient will report a 25% reduction in pain standing Baseline:  Goal status: Had initially improved but now back to baseline number following fall 2012/5  LONG TERM GOALS: Target date: 10/26/2023    Patient will ambulate 1/2 mile without increased lower back pain in order to perform daily tasks Baseline:  Goal status: Can walk about 1/4 mile at this time 1/14  2.  Patient will perform full gym program without increased low back pain Baseline:  Goal status: Continue to work on his exercise program 1/14  3.  Patient will be independent with complete program Baseline:  Goal status: INITIAL   PLAN:  PT FREQUENCY: 2x/week  PT DURATION: 8 weeks  PLANNED INTERVENTIONS: Therapeutic exercises, Therapeutic activity, Neuromuscular re-education, Balance training, Gait training, Patient/Family education, Self Care, Joint mobilization, Stair training, DME instructions, Aquatic Therapy, Dry Needling, Electrical  stimulation, Cryotherapy, Moist heat, Taping, Manual therapy, and Re-evaluation. SABRA  PLAN FOR NEXT SESSION: Review gym program.  Consider tightening patient's range of motion.  If patient feels like he is not getting as much work increased weight.  Continue with manual therapy to lumbar spine.  Consider trigger point dry needling.   Alm JINNY Don, PT 01/26/2024, 3:46 PM

## 2024-01-30 ENCOUNTER — Other Ambulatory Visit: Payer: Self-pay

## 2024-01-30 ENCOUNTER — Other Ambulatory Visit: Payer: Medicare Other

## 2024-01-30 DIAGNOSIS — N401 Enlarged prostate with lower urinary tract symptoms: Secondary | ICD-10-CM | POA: Diagnosis not present

## 2024-01-31 LAB — PSA: Prostate Specific Ag, Serum: 0.6 ng/mL (ref 0.0–4.0)

## 2024-02-06 ENCOUNTER — Ambulatory Visit (HOSPITAL_BASED_OUTPATIENT_CLINIC_OR_DEPARTMENT_OTHER): Payer: Medicare Other | Admitting: Physical Therapy

## 2024-02-06 ENCOUNTER — Ambulatory Visit: Payer: Medicare Other | Admitting: Urology

## 2024-02-06 DIAGNOSIS — E538 Deficiency of other specified B group vitamins: Secondary | ICD-10-CM | POA: Diagnosis not present

## 2024-02-06 DIAGNOSIS — M5116 Intervertebral disc disorders with radiculopathy, lumbar region: Secondary | ICD-10-CM | POA: Diagnosis not present

## 2024-02-06 DIAGNOSIS — M6281 Muscle weakness (generalized): Secondary | ICD-10-CM | POA: Diagnosis not present

## 2024-02-06 DIAGNOSIS — R29898 Other symptoms and signs involving the musculoskeletal system: Secondary | ICD-10-CM | POA: Diagnosis not present

## 2024-02-09 ENCOUNTER — Ambulatory Visit: Payer: Medicare Other | Admitting: Urology

## 2024-02-09 ENCOUNTER — Other Ambulatory Visit: Payer: Self-pay | Admitting: Internal Medicine

## 2024-02-09 VITALS — BP 154/68 | HR 72

## 2024-02-09 DIAGNOSIS — N138 Other obstructive and reflux uropathy: Secondary | ICD-10-CM

## 2024-02-09 DIAGNOSIS — N5201 Erectile dysfunction due to arterial insufficiency: Secondary | ICD-10-CM | POA: Diagnosis not present

## 2024-02-09 DIAGNOSIS — N411 Chronic prostatitis: Secondary | ICD-10-CM | POA: Diagnosis not present

## 2024-02-09 DIAGNOSIS — R300 Dysuria: Secondary | ICD-10-CM

## 2024-02-09 DIAGNOSIS — N401 Enlarged prostate with lower urinary tract symptoms: Secondary | ICD-10-CM | POA: Diagnosis not present

## 2024-02-09 MED ORDER — TADALAFIL 5 MG PO TABS: 5.0000 mg | ORAL_TABLET | Freq: Every day | ORAL | 11 refills | Status: AC

## 2024-02-09 NOTE — Progress Notes (Signed)
02/09/2024 11:45 AM   [name] [birth year] [id number]  Referring provider: Georgann Housekeeper, MD [address] [place],  [place] [address]  Followup prostatitis   HPI: Mr [name] is a 78yo here for followup for BPH and chronic prostatitis. No recent prostatitis flares. IPSS 4 QOL 0 on no BPH therapy. No dysuria or hematuria. No worsening LUTS. He has spine issues and has had multiple spinal fusions. PSA 0.6. He uses tadalafil 20mg  prn with mixed results. If he takes the tadalafil three days in a row his erections are significantly better.    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   Paroxysmal atrial flutter (HCC)     Surgical History: Past Surgical History:  Procedure Laterality Date   ABDOMINAL EXPOSURE N/A 01/06/2020   Procedure: ABDOMINAL EXPOSURE;  Surgeon: Larina Earthly, MD;  Location: [facility];  Service: Vascular;  Laterality: N/A;   ANTERIOR LUMBAR FUSION N/A 01/06/2020   Procedure: Lumbar Five- Sacral One  Anterior lumbar interbody fusion with infuse;  Surgeon: Barnett Abu, MD;  Location: [facility];  Service: Neurosurgery;  Laterality: N/A;  Lumbar Five- Sacral One  Anterior lumbar interbody fusion with infuse   CERVICAL FUSION  1996, 1997   2 surgery- skull to C7   COLON RESECTION  2007   COLONOSCOPY W/ POLYPECTOMY     CORONARY ATHERECTOMY N/A 07/01/2021   Procedure: CORONARY ATHERECTOMY;  Surgeon: Kathleene Hazel, MD;  Location: [facility];  Service: Cardiovascular;  Laterality: N/A;   CORONARY PRESSURE/FFR STUDY N/A  06/18/2021   Procedure: INTRAVASCULAR PRESSURE WIRE/FFR STUDY;  Surgeon: Kathleene Hazel, MD;  Location: [facility];  Service: Cardiovascular;  Laterality: N/A;   CORONARY STENT INTERVENTION N/A 07/01/2021   Procedure: CORONARY STENT INTERVENTION;  Surgeon: Kathleene Hazel, MD;  [facility]: [facility];  Service: Cardiovascular;  Laterality: N/A;   CORONARY ULTRASOUND/IVUS N/A 07/01/2021   Procedure: Intravascular Ultrasound/IVUS;  Surgeon: Kathleene Hazel, MD;  Location: [facility];  Service: Cardiovascular;  Laterality: N/A;   HEMORRHOID SURGERY N/A 01/13/2016   Procedure: HEMORRHOIDECTOMY AND REMOVAL OF ANAL SKIN TAG;  Surgeon: Abigail Miyamoto, MD;  Location: [facility];  Service: General;  Laterality: N/A;   REPLACEMENT TOTAL KNEE Left 2005   left   RIGHT/LEFT HEART CATH AND CORONARY ANGIOGRAPHY N/A 06/18/2021   Procedure: RIGHT/LEFT HEART CATH AND CORONARY ANGIOGRAPHY;  Surgeon: Kathleene Hazel, MD;  Location: [facility];  Service: Cardiovascular;  Laterality: N/A;   SYNOVIAL CYST EXCISION     lumbar spine   TONSILLECTOMY     TOTAL KNEE ARTHROPLASTY Right 01/06/2017   Procedure: RIGHT TOTAL KNEE ARTHROPLASTY;  Surgeon: Eugenia Mcalpine, MD;  Location: [facility];  Service: Orthopedics;  Laterality: Right;  Adductior Block    Home Medications:  Allergies as of 02/09/2024       Reactions   Alfuzosin Other (See Comments), Swelling   Felt winded/faint   Duloxetine Hcl Other (See Comments), Swelling   "  Nausea, dizzy, sick" Other Reaction(s): Dizziness   Gabapentin Nausea Only, Nausea And Vomiting   Other Reaction(s): dizzy   Pregabalin Nausea Only, Other (See Comments), Nausea And Vomiting, Itching   Felt poorly Other Reaction(s): dizzy   Tamsulosin Hcl Other (See Comments), Nausea Only, Swelling   Orthostatic hypotension   Contrast Media [iodinated Contrast Media] Rash   Cath dye 06/18/21 rash started next day   Niacin Diarrhea         Medication List        Accurate as of February 09, 2024 11:45 AM. If you have any questions, ask your nurse or doctor.          amLODipine 5 MG tablet Commonly known as: NORVASC TAKE 1 TABLET BY MOUTH TWICE DAILY   ascorbic acid 500 MG tablet Commonly known as: VITAMIN C Take 500 mg by mouth daily with lunch.   aspirin EC 81 MG tablet Take 1 tablet (81 mg total) by mouth daily. Swallow whole.   B-complex with vitamin C tablet Take 1 tablet by mouth daily with lunch.   benazepril 20 MG tablet Commonly known as: LOTENSIN TAKE 1 TABLET BY MOUTH TWICE DAILY   CeleBREX 200 MG capsule Generic drug: celecoxib Take 200 mg by mouth as directed. As needed for pain   celecoxib 200 MG capsule Commonly known as: CeleBREX Take 1 capsule (200 mg total) by mouth daily as needed.   COSAMIN DS PO Take 1 tablet by mouth every evening.   hydrochlorothiazide 12.5 MG capsule Commonly known as: MICROZIDE Take 12.5 mg by mouth daily with lunch.   methocarbamol 500 MG tablet Commonly known as: ROBAXIN Take 500 mg by mouth every 8 (eight) hours as needed for muscle spasms.   metoprolol tartrate 25 MG tablet Commonly known as: LOPRESSOR Take 25 mg by mouth daily as needed (afib.).   multivitamin with minerals Tabs tablet Take 1 tablet by mouth daily with lunch. Adult 50+   nitroGLYCERIN 0.4 MG SL tablet Commonly known as: Nitrostat Place 1 tablet (0.4 mg total) under the tongue every 5 (five) minutes as needed.   oxyCODONE 5 MG immediate release tablet Commonly known as: Roxicodone Take 1-2 tablets (5-10 mg total) by mouth every 4 (four) hours as needed for severe pain or breakthrough pain.   oxyCODONE 5 MG immediate release tablet Commonly known as: Oxy IR/ROXICODONE Take 1 tablet (5 mg total) by mouth every 6 (six) hours as needed.   oxyCODONE 5 MG immediate release tablet Commonly known as: Oxy IR/ROXICODONE Take 1 tablet (5 mg total) by mouth every 6 (six) hours as  needed.   oxyCODONE 5 MG immediate release tablet Commonly known as: Oxy IR/ROXICODONE Take 1 tablet (5 mg total) by mouth every 6 (six) hours as needed.   oxyCODONE 5 MG immediate release tablet Commonly known as: Oxy IR/ROXICODONE Take 1 tablet (5 mg total) by mouth every 6 (six) hours as needed.   rosuvastatin 10 MG tablet Commonly known as: CRESTOR Take 1 tablet (10 mg total) by mouth daily.   scopolamine 1 MG/3DAYS Commonly known as: TRANSDERM-SCOP Place 1 patch onto the skin every three (3) days as needed (nausea).   tadalafil 20 MG tablet Commonly known as: CIALIS Take 1 tablet (20 mg total) by mouth daily as needed.   Vitamin D3 50 MCG (2000 UT) Tabs Take 2,000 Units by mouth daily with lunch. B complex, vitamin c 500        Allergies:  Allergies  Allergen Reactions  Alfuzosin Other (See Comments) and Swelling    Felt winded/faint   Duloxetine Hcl Other (See Comments) and Swelling    "Nausea, dizzy, sick"  Other Reaction(s): Dizziness   Gabapentin Nausea Only and Nausea And Vomiting    Other Reaction(s): dizzy   Pregabalin Nausea Only, Other (See Comments), Nausea And Vomiting and Itching    Felt poorly  Other Reaction(s): dizzy   Tamsulosin Hcl Other (See Comments), Nausea Only and Swelling    Orthostatic hypotension   Contrast Media [Iodinated Contrast Media] Rash    Cath dye 06/18/21 rash started next day   Niacin Diarrhea    Family History: Family History  Problem Relation Age of Onset   Coronary artery disease Mother    Coronary artery disease Father    Colon cancer Neg Hx    Stomach cancer Neg Hx     Social History:  reports that he has never smoked. He has never used smokeless tobacco. He reports that he does not drink alcohol and does not use drugs.  ROS: All other review of systems were reviewed and are negative except what is noted above in HPI  Physical Exam: BP (!) 154/68   Pulse 72   Constitutional:  Alert and oriented, No acute  distress. HEENT: [place] AT, moist mucus membranes.  Trachea midline, no masses. Cardiovascular: No clubbing, cyanosis, or edema. Respiratory: Normal respiratory effort, no increased work of breathing. GI: Abdomen is soft, nontender, nondistended, no abdominal masses GU: No CVA tenderness.  Lymph: No cervical or inguinal lymphadenopathy. Skin: No rashes, bruises or suspicious lesions. Neurologic: Grossly intact, no focal deficits, moving all 4 extremities. Psychiatric: Normal mood and affect.  Laboratory Data: Lab Results  Component Value Date   WBC 6.4 09/19/2023   HGB 13.4 09/19/2023   HCT 39.6 09/19/2023   MCV 101 (H) 09/19/2023   PLT 155 09/19/2023    Lab Results  Component Value Date   CREATININE 1.15 08/25/2023    Lab Results  Component Value Date   PSA 0.5 09/05/2016   PSA 0.49 09/10/2015    No results found for: "TESTOSTERONE"  Lab Results  Component Value Date   HGBA1C 5.3 09/05/2016    Urinalysis    Component Value Date/Time   COLORURINE AMBER (A) 12/30/2016 1046   APPEARANCEUR Hazy (A) 02/03/2023 1211   LABSPEC 1.020 12/30/2016 1046   PHURINE 5.0 12/30/2016 1046   GLUCOSEU Negative 02/03/2023 1211   HGBUR NEGATIVE 12/30/2016 1046   BILIRUBINUR Negative 02/03/2023 1211   KETONESUR NEGATIVE 12/30/2016 1046   PROTEINUR 1+ (A) 02/03/2023 1211   PROTEINUR NEGATIVE 12/30/2016 1046   UROBILINOGEN 0.2 08/31/2007 1221   NITRITE Negative 02/03/2023 1211   NITRITE NEGATIVE 12/30/2016 1046   LEUKOCYTESUR Negative 02/03/2023 1211    Lab Results  Component Value Date   LABMICR See below: 02/03/2023   WBCUA 0-5 02/03/2023   LABEPIT 0-10 02/03/2023   MUCUS Present (A) 02/03/2023   BACTERIA None seen 02/03/2023    Pertinent Imaging:  No results found for this or any previous visit.  No results found for this or any previous visit.  No results found for this or any previous visit.  No results found for this or any previous visit.  No results found for  this or any previous visit.  No results found for this or any previous visit.  No results found for this or any previous visit.  No results found for this or any previous visit.   Assessment & Plan:  1. Benign prostatic hyperplasia with urinary obstruction (Primary) Patient defers therapy at this time - Urinalysis, Routine w reflex microscopic  2. Chronic prostatitis without hematuria -no recent flares. Continue observation  3. Dysuria -resolved  4. Erectile dysfunction due to arterial insufficiency -we will trial tadalafil 5mg  daily.    No follow-ups on file.  Wilkie Aye, MD  [facility] [place]

## 2024-02-12 LAB — URINALYSIS, ROUTINE W REFLEX MICROSCOPIC
Bilirubin, UA: NEGATIVE
Glucose, UA: NEGATIVE
Ketones, UA: NEGATIVE
Leukocytes,UA: NEGATIVE
Nitrite, UA: NEGATIVE
RBC, UA: NEGATIVE
Specific Gravity, UA: 1.025 (ref 1.005–1.030)
Urobilinogen, Ur: 0.2 mg/dL (ref 0.2–1.0)
pH, UA: 6 (ref 5.0–7.5)

## 2024-02-12 LAB — MICROSCOPIC EXAMINATION: Bacteria, UA: NONE SEEN

## 2024-02-13 ENCOUNTER — Encounter: Payer: Self-pay | Admitting: Urology

## 2024-02-13 NOTE — Patient Instructions (Signed)

## 2024-02-14 ENCOUNTER — Other Ambulatory Visit (HOSPITAL_BASED_OUTPATIENT_CLINIC_OR_DEPARTMENT_OTHER): Payer: Self-pay

## 2024-02-14 MED ORDER — OXYCODONE HCL 5 MG PO TABS
5.0000 mg | ORAL_TABLET | Freq: Four times a day (QID) | ORAL | 0 refills | Status: AC | PRN
  Filled 2024-03-21: qty 120, 30d supply, fill #0

## 2024-02-23 DIAGNOSIS — Z6834 Body mass index (BMI) 34.0-34.9, adult: Secondary | ICD-10-CM | POA: Diagnosis not present

## 2024-02-23 DIAGNOSIS — M48061 Spinal stenosis, lumbar region without neurogenic claudication: Secondary | ICD-10-CM | POA: Diagnosis not present

## 2024-03-19 DIAGNOSIS — L570 Actinic keratosis: Secondary | ICD-10-CM | POA: Diagnosis not present

## 2024-03-19 DIAGNOSIS — L821 Other seborrheic keratosis: Secondary | ICD-10-CM | POA: Diagnosis not present

## 2024-03-19 DIAGNOSIS — L814 Other melanin hyperpigmentation: Secondary | ICD-10-CM | POA: Diagnosis not present

## 2024-03-19 DIAGNOSIS — Z86006 Personal history of melanoma in-situ: Secondary | ICD-10-CM | POA: Diagnosis not present

## 2024-03-19 DIAGNOSIS — D225 Melanocytic nevi of trunk: Secondary | ICD-10-CM | POA: Diagnosis not present

## 2024-03-21 ENCOUNTER — Other Ambulatory Visit (HOSPITAL_BASED_OUTPATIENT_CLINIC_OR_DEPARTMENT_OTHER): Payer: Self-pay

## 2024-03-21 DIAGNOSIS — M5136 Other intervertebral disc degeneration, lumbar region with discogenic back pain only: Secondary | ICD-10-CM | POA: Diagnosis not present

## 2024-03-21 DIAGNOSIS — M48061 Spinal stenosis, lumbar region without neurogenic claudication: Secondary | ICD-10-CM | POA: Diagnosis not present

## 2024-03-21 DIAGNOSIS — Z981 Arthrodesis status: Secondary | ICD-10-CM | POA: Diagnosis not present

## 2024-03-21 DIAGNOSIS — M4856XA Collapsed vertebra, not elsewhere classified, lumbar region, initial encounter for fracture: Secondary | ICD-10-CM | POA: Diagnosis not present

## 2024-03-22 ENCOUNTER — Other Ambulatory Visit (HOSPITAL_BASED_OUTPATIENT_CLINIC_OR_DEPARTMENT_OTHER): Payer: Self-pay

## 2024-03-23 ENCOUNTER — Other Ambulatory Visit (HOSPITAL_BASED_OUTPATIENT_CLINIC_OR_DEPARTMENT_OTHER): Payer: Self-pay

## 2024-03-25 ENCOUNTER — Other Ambulatory Visit (HOSPITAL_BASED_OUTPATIENT_CLINIC_OR_DEPARTMENT_OTHER): Payer: Self-pay

## 2024-04-08 DIAGNOSIS — R059 Cough, unspecified: Secondary | ICD-10-CM | POA: Diagnosis not present

## 2024-04-26 DIAGNOSIS — M48061 Spinal stenosis, lumbar region without neurogenic claudication: Secondary | ICD-10-CM | POA: Diagnosis not present

## 2024-04-26 DIAGNOSIS — Z6834 Body mass index (BMI) 34.0-34.9, adult: Secondary | ICD-10-CM | POA: Diagnosis not present

## 2024-04-30 ENCOUNTER — Other Ambulatory Visit: Payer: Self-pay | Admitting: Neurological Surgery

## 2024-04-30 DIAGNOSIS — E7800 Pure hypercholesterolemia, unspecified: Secondary | ICD-10-CM | POA: Diagnosis not present

## 2024-04-30 DIAGNOSIS — R7303 Prediabetes: Secondary | ICD-10-CM | POA: Diagnosis not present

## 2024-04-30 DIAGNOSIS — I4892 Unspecified atrial flutter: Secondary | ICD-10-CM | POA: Diagnosis not present

## 2024-04-30 DIAGNOSIS — M48061 Spinal stenosis, lumbar region without neurogenic claudication: Secondary | ICD-10-CM

## 2024-04-30 DIAGNOSIS — Z1331 Encounter for screening for depression: Secondary | ICD-10-CM | POA: Diagnosis not present

## 2024-04-30 DIAGNOSIS — D039 Melanoma in situ, unspecified: Secondary | ICD-10-CM | POA: Diagnosis not present

## 2024-04-30 DIAGNOSIS — I10 Essential (primary) hypertension: Secondary | ICD-10-CM | POA: Diagnosis not present

## 2024-04-30 DIAGNOSIS — Z0000 Encounter for general adult medical examination without abnormal findings: Secondary | ICD-10-CM | POA: Diagnosis not present

## 2024-05-01 ENCOUNTER — Encounter: Payer: Self-pay | Admitting: Neurological Surgery

## 2024-05-02 ENCOUNTER — Ambulatory Visit
Admission: RE | Admit: 2024-05-02 | Discharge: 2024-05-02 | Disposition: A | Discharge status: Home / Self Care | Source: Ambulatory Visit | Attending: Neurological Surgery | Admitting: Neurological Surgery

## 2024-05-02 ENCOUNTER — Other Ambulatory Visit (HOSPITAL_BASED_OUTPATIENT_CLINIC_OR_DEPARTMENT_OTHER): Payer: Self-pay

## 2024-05-02 DIAGNOSIS — M48061 Spinal stenosis, lumbar region without neurogenic claudication: Secondary | ICD-10-CM | POA: Diagnosis not present

## 2024-05-02 DIAGNOSIS — F1120 Opioid dependence, uncomplicated: Secondary | ICD-10-CM | POA: Diagnosis not present

## 2024-05-02 DIAGNOSIS — M542 Cervicalgia: Secondary | ICD-10-CM | POA: Diagnosis not present

## 2024-05-02 DIAGNOSIS — Z981 Arthrodesis status: Secondary | ICD-10-CM | POA: Diagnosis not present

## 2024-05-07 DIAGNOSIS — K080 Exfoliation of teeth due to systemic causes: Secondary | ICD-10-CM | POA: Diagnosis not present

## 2024-05-09 ENCOUNTER — Other Ambulatory Visit (HOSPITAL_BASED_OUTPATIENT_CLINIC_OR_DEPARTMENT_OTHER): Payer: Self-pay

## 2024-05-28 ENCOUNTER — Ambulatory Visit (HOSPITAL_BASED_OUTPATIENT_CLINIC_OR_DEPARTMENT_OTHER): Attending: Family Medicine | Admitting: Physical Therapy

## 2024-05-28 DIAGNOSIS — R2689 Other abnormalities of gait and mobility: Secondary | ICD-10-CM | POA: Insufficient documentation

## 2024-05-28 DIAGNOSIS — M542 Cervicalgia: Secondary | ICD-10-CM | POA: Insufficient documentation

## 2024-05-28 DIAGNOSIS — M545 Low back pain, unspecified: Secondary | ICD-10-CM | POA: Insufficient documentation

## 2024-05-28 DIAGNOSIS — G8929 Other chronic pain: Secondary | ICD-10-CM | POA: Diagnosis not present

## 2024-05-28 NOTE — Therapy (Unsigned)
 OUTPATIENT PHYSICAL THERAPY THORACOLUMBAR Progress note    Patient Name: [name] MRN: [id number] DOB:June 11, 1946, 78 y.o., male Today's Date: 05/29/2024  END OF SESSION:  PT End of Session - 05/28/24 1513     Visit Number 10    Number of Visits 16    Date for PT Re-Evaluation 07/24/24    Authorization Type progress note done on visit 4    PT Start Time 1145    PT Stop Time 1228    PT Time Calculation (min) 43 min    Activity Tolerance Patient tolerated treatment well    Behavior During Therapy [facility] for tasks assessed/performed                   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   Paroxysmal atrial flutter (HCC)    Past Surgical History:  Procedure Laterality Date   ABDOMINAL EXPOSURE N/A 01/06/2020   Procedure: ABDOMINAL EXPOSURE;  Surgeon: Mayo Speck, MD;  Location: [facility] OR;  Service: Vascular;  Laterality: N/A;   ANTERIOR LUMBAR FUSION N/A 01/06/2020   Procedure: Lumbar Five- Sacral One  Anterior lumbar interbody fusion with infuse;  Surgeon: Elna Haggis, MD;  Location: [facility] OR;  Service: Neurosurgery;  Laterality: N/A;  Lumbar Five- Sacral One  Anterior lumbar interbody fusion with infuse   CERVICAL FUSION  1996, 1997   2 surgery- skull to C7   COLON RESECTION  2007   COLONOSCOPY W/ POLYPECTOMY     CORONARY ATHERECTOMY N/A 07/01/2021   Procedure: CORONARY ATHERECTOMY;  Surgeon: Odie Benne, MD;  Location: [facility] INVASIVE CV LAB;  Service: Cardiovascular;  Laterality: N/A;   CORONARY PRESSURE/FFR STUDY N/A 06/18/2021   Procedure:  INTRAVASCULAR PRESSURE WIRE/FFR STUDY;  Surgeon: Odie Benne, MD;  Location: [facility] INVAS[facility] CV LAB;  Service: Cardiovascular;  Laterality: N/A;   CORONARY STENT INTERVENTION N/A 07/01/2021   Procedure: CORONARY STENT INTERVENTION;  Surgeon: Odie Benne, MD;  Location: [facility] IN[facility];  Service: Cardiovascular;  Laterality: N/A;   CORONARY ULTRASOUND/IVUS N/A 07/01/2021   Procedure: Intravascular Ultrasound/IVUS;  Surgeon: Odie Benne, MD;  Location: [facility] IN[facility];  Service: Cardiovascular;  Laterality: N/A;   HEMORRHOID SURGERY N/A 01/13/2016   Procedure: HEMORRHOIDECTOMY AND REMOVAL OF ANAL SKIN TAG;  Surgeon: Oza Blumenthal, MD;  Location: [facility] OR;  Service: General;  Laterality: N/A;   REPLACEMENT TOTAL KNEE Left 2005   left   RIGHT/LEFT HEART CATH AND CORONARY ANGIOGRAPHY N/A 06/18/2021   Procedure: RIGHT/LEFT HEART CATH AND CORONARY ANGIOGRAPHY;  Surgeon: Odie Benne, MD;  Location: [facility] INV[facility]IVE CV LAB;  Service: Cardiovascular;  Laterality: N/A;   SYNOVIAL CYST EXCISION     lumbar spine   TONSILLECTOMY     TOTAL KNEE ARTHROPLASTY Right 01/06/2017   Procedure: RIGHT TOTAL KNEE ARTHROPLASTY;  Surgeon: Genevie Kerns, MD;  Location: [facility] ORS;  Service: Orthopedics;  Laterality: Right;  Adductior Block   Patient Active Problem List   Diagnosis Date Noted   Pure hypercholesterolemia  06/20/2023   Degenerative cervical disc 07/07/2021   Coronary artery disease involving native coronary artery of native heart with unstable angina pectoris (HCC)    CAD (coronary artery disease)    Pseudoarthrosis of lumbar spine 01/06/2020   Coronary artery calcification seen on CT scan 01/29/2019   Change in bowel habits 06/12/2018   Primary osteoarthritis of right knee 01/06/2017   S/P knee replacement 01/06/2017   Diplopia 07/10/2013   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       PCP: Arlis Lakes MD  REFERRING PROVIDER: Cheryle Corral  REFERRING DIAG:  Diagnosis  M54.50 (ICD-10-CM) - Lumbar spine pain    Rationale for Evaluation and Treatment: Rehabilitation  THERAPY DIAG:  Chronic bilateral low back pain without sciatica  Other abnormalities of gait and mobility  Cervicalgia  ONSET DATE: Greater than 20 years  SUBJECTIVE:                                                                                                                                                                                           The patient returns to therapy with a significant flair in back pain and weakness into bilateral LE. He is having difficulty standing straight and standing for any period of time. He has been consulting with a Careers adviser. He plans to have surgery as soon as he is able but wants to try to maintain his strength and continue to go to the gym in the meantime.    eval:Patient has a significant history of low back and neck pain.  He had a cervical fusion greater than 20 years ago stemming from a significant injury.  He has then had subsequent lower back surgeries over time.  At this time he has significant low back pain.  He feels like he is bending over forward more.  He feels like he cannot stand straight. He fells at this time like he can't walk more then 20 min.   PERTINENT HISTORY:  Multijoint arthritis, A-fib, multiple low back surgeries,  PAIN:  Are you having pain? Yes: NPRS scale 5-610 depending on activity Pain location: Bilateral lower back right greater than left Pain description: Aching Aggravating factors: Standing and walking Relieving factors: Not standing and walking  PRECAUTIONS: None  RED FLAGS: None   WEIGHT BEARING  RESTRICTIONS: No  FALLS:  Has patient fallen in last 6 months? 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  Comes to the gym Monday Wednesday and Friday.  PLOF: Independent  PATIENT GOALS: To have less pain in his back, to be able to walk 1 mile, to be able to stand straighter.  NEXT MD VISIT: Nothing currently scheduled  OBJECTIVE:   DIAGNOSTIC FINDINGS:  Nothing recent or pertinent    SCREENING FOR RED FLAGS: Bowel or bladder incontinence: No Spinal tumors: No Cauda equina syndrome: No Compression fracture: No Abdominal aneurysm: No  COGNITION: Overall cognitive status: Within functional limits for tasks assessed         POSTURE: No Significant postural limitations  PALPATION: Significant tenderness to palpation in bilateral lower lumbar paraspinals and bilateral gluteals  LUMBAR ROM:   AROM eval  Flexion 70  Extension Stands -10 degrees from neutral  Right lateral flexion   Left lateral flexion   Right rotation 25% limited  Left rotation 25% limited   (Blank rows = not tested)  LOWER EXTREMITY ROM:     Passive  Right eval Left eval  Hip flexion    Hip extension    Hip abduction    Hip adduction    Hip internal rotation    Hip external rotation    Knee flexion    Knee extension    Ankle dorsiflexion    Ankle plantarflexion    Ankle inversion    Ankle eversion     (Blank rows = not tested)  LOWER EXTREMITY MMT:    MMT Right eval Left eval Right  12/5  Left 12/5  Hip flexion 35 37.5 35.3 39  Hip extension      Hip abduction 37.3 36.6 35.3 34.6  Hip adduction      Hip internal rotation      Hip external rotation      Knee flexion      Knee extension 46.1 54.7 44.2 50  Ankle dorsiflexion      Ankle plantarflexion      Ankle inversion      Ankle eversion       (Blank rows = not tested)   GAIT: Walks with flexed trunk  TODAY'S TREATMENT:                                                                                                                               DATE:  6/11 Trigger Point Dry-Needling  Treatment instructions: Expect mild to moderate muscle soreness. S/S of pneumothorax if dry needled over a lung field, and to seek immediate medical attention should they occur. Patient verbalized understanding of these instructions and education.  Patient Consent Given: Yes Education handout provided: Yes Muscles treated: L1-2-3 parapsinals bilateral; bilateral gluteals  Electrical stimulation performed: No Parameters: N/A Treatment response/outcome: great twitch    There-ex:  Ball roll out fwd and lateral x10 5 sec hold  Ball press 3x15    Manual manual therapy performed in  prone with head down position on table.  Trigger point release to lumbar spine paraspinals, trigger point release to bilateral gluteals gentle      2/7 Trigger Point Dry-Needling  Treatment instructions: Expect mild to moderate muscle soreness. S/S of pneumothorax if dry needled over a lung field, and to seek immediate medical attention should they occur. Patient verbalized understanding of these instructions and education.  Patient Consent Given: Yes Education handout provided: Yes Muscles treated: L1-2-3 parapsinals bilateral; bilateral gluteals  Electrical stimulation performed: No Parameters: N/A Treatment response/outcome: great twitch   Ball roll out fwd and lateral x10 5 sec hold  Ball press 3x15    Manual manual therapy performed in prone with head down position on table.  Trigger point release to lumbar spine paraspinals, trigger point release to bilateral gluteals gentle     1/28 rigger Point Dry-Needling  Treatment instructions: Expect mild to moderate muscle soreness. S/S of pneumothorax if dry needled over a lung field, and to seek immediate medical attention should they occur. Patient verbalized understanding of these instructions and  education.  Patient Consent Given: Yes Education handout provided: Yes Muscles treated: L1-2-3 parapsinals bilateral; bilateral gluteals  Electrical stimulation performed: No Parameters: N/A Treatment response/outcome: great twitch   Ball roll out fwd and lateral x10 5 sec hold   Manual manual therapy performed in prone with head down position on table.  Trigger point release to lumbar spine paraspinals, trigger point release to bilateral gluteals gentle   1/14  Trigger Point Dry-Needling  Treatment instructions: Expect mild to moderate muscle soreness. S/S of pneumothorax if dry needled over a lung field, and to seek immediate medical attention should they occur. Patient verbalized understanding of these instructions and education.  Patient Consent Given: Yes Education handout provided: Yes Muscles treated: L1-2-3 parapsinals bilateral; bilateral gluteals  Electrical stimulation performed: No Parameters: N/A Treatment response/outcome: great twitch   Ball roll out fwd and lateral x10 5 sec hold   Manual manual therapy performed in prone with head down position on table.  Trigger point release to lumbar spine paraspinals, trigger point release to bilateral gluteals gentle   Paloff press 10 pounds 2 x 10 each side Row 2 x 15 20 pounds Shoulder extension 2 x 15 20 pounds  Reviewed posture and core stabilization for each exercise  1/7 Trigger Point Dry-Needling  Treatment instructions: Expect mild to moderate muscle soreness. S/S of pneumothorax if dry needled over a lung field, and to seek immediate medical attention should they occur. Patient verbalized understanding of these instructions and education.  Patient Consent Given: Yes Education handout provided: Yes Muscles treated: L1-2-3 parapsinals bilateral; bilateral gluteals  Electrical stimulation performed: No Parameters: N/A Treatment response/outcome: great twitch   Ball roll out fwd and lateral x10 5 sec hold    Manual manual therapy performed in prone with head down position on table.  Trigger point release to lumbar spine paraspinals, trigger point release to bilateral gluteals gentle     PATIENT EDUCATION:  Education details: HEP, symptom management, benefits and risks of dry needling. Person educated: Patient Education method: Explanation, Demonstration, Tactile cues, Verbal cues, and Handouts Education comprehension: verbalized understanding, returned demonstration, verbal cues required, tactile cues required, and needs further education  HOME EXERCISE PROGRAM: Will review gym program on future visits  ASSESSMENT:  CLINICAL IMPRESSION: The patient returns for follow up following an exacerbation of symptoms. He is having difficulty coming to full extension. He reports a significant increase in weakness in bilateral LE.  He is having difficulty walking from the parking deck in. His strength numbers have declined. He continues to come to the gym. Following needling he was able to stand straighter and had a reduction pain. He would benefit from further skilled therapy 1W8 to maintain and improve strength prior to his surgery. See below for goal specific progress    Eval: Patient is a 78 year old long history of low back pain.  He has been coming to the gym and working on a program given to him previously by physical therapy.  Despite program he feels like he is having a progressive loss of ability to ambulate and ability to stand straight.  At this time he can walk less than 150 feet before he has a significant onset of pain.  He has significant spasming in his lower back.  He has difficulty coming to a full extension position.  Following manual therapy today he was much closer to a full extension position.  He was advised to see how long the benefits of manual therapy last for.  It is questionable how long the carryover will last.  Hope is we can combine manual therapy with his current exercise  program to improve overall ability to stand straight.  We will also take a look at his current program and look for potential triggers of pain. OBJECTIVE IMPAIRMENTS: Abnormal gait, decreased activity tolerance, difficulty walking, decreased ROM, improper body mechanics, and pain.   ACTIVITY LIMITATIONS: carrying, lifting, standing, squatting, stairs, transfers, and locomotion level  PARTICIPATION LIMITATIONS: driving, shopping, community activity, occupation, and yard work  PERSONAL FACTORS: 1-2 comorbidities: Spine surgeries A-fib are also affecting patient's functional outcome.   REHAB POTENTIAL: Excellent  CLINICAL DECISION MAKING: Evolving/moderate complexity  EVALUATION COMPLEXITY: Moderate   GOALS: Goals reviewed with patient? Yes  SHORT TERM GOALS: Target date: 06/25/2024       Patient will increase standing lumbar extension by 5 degrees Baseline: Goal status: Progressing 10/3   2.  Therapy will review gym program and modify exercises that are potential triggers of pain Baseline:  Goal status: Updated 12/5  3.  Patient will report a 25% reduction in pain standing Baseline:  Goal status: Had initially improved but now back to baseline number following fall 2012/5  LONG TERM GOALS: Target date: 07/23/2024       Patient will ambulate 1/2 mile without increased lower back pain in order to perform daily tasks Baseline:  Goal status: declining overall ability to ambulate 6/10  2.  Patient will perform full gym program without increased low back pain Baseline:  Goal status: performing but having pain 6/10  3.  Patient will be independent with complete program Baseline:  Goal status:will progress 6/10    PLAN:  PT FREQUENCY: 2x/week  PT DURATION: 8 weeks  PLANNED INTERVENTIONS: Therapeutic exercises, Therapeutic activity, Neuromuscular re-education, Balance training, Gait training, Patient/Family education, Self Care, Joint mobilization, Stair training, DME  instructions, Aquatic Therapy, Dry Needling, Electrical stimulation, Cryotherapy, Moist heat, Taping, Manual therapy, and Re-evaluation. Aaron Aas  PLAN FOR NEXT SESSION:  Continue to work on needling and soft tissue mobilization.    Kitty Perkins, PT 05/29/2024, 2:17 PM

## 2024-05-29 ENCOUNTER — Encounter (HOSPITAL_BASED_OUTPATIENT_CLINIC_OR_DEPARTMENT_OTHER): Payer: Self-pay | Admitting: Physical Therapy

## 2024-06-03 ENCOUNTER — Other Ambulatory Visit: Payer: Self-pay | Admitting: Neurological Surgery

## 2024-06-10 NOTE — Pre-Procedure Instructions (Signed)
 Surgical Instructions   Your procedure is scheduled on Tuesday, July 1st. Report to [facility] Main Entrance A at 10:00 A.M., then check in with the Admitting office. Any questions or running late day of surgery: call [phone]  Questions prior to your surgery date: call [phone], Monday-Friday, 8am-4pm. If you experience any cold or flu symptoms such as cough, fever, chills, shortness of breath, etc. between now and your scheduled surgery, please notify us  at the above number.     Remember:  Do not eat or drink after midnight the night before your surgery     Take these medicines the morning of surgery with A SIP OF WATER   amLODipine  (NORVASC )    May take these medicines IF NEEDED: methocarbamol  (ROBAXIN )  nitroGLYCERIN  (NITROSTAT )- If you have to take this medication prior to surgery, please call [phone] and report this to a nurse  oxyCODONE  (OXY IR/ROXICODONE )    Follow your surgeon's instructions on when to stop Aspirin .  If no instructions were given by your surgeon then you will need to call the office to get those instructions.    One week prior to surgery, STOP taking any Aleve, Naproxen, Ibuprofen, Motrin, Advil, Goody's, BC's, all herbal medications, fish oil, and non-prescription vitamins. This includes celecoxib  (CELEBREX ).                     Do NOT Smoke (Tobacco/Vaping) for 24 hours prior to your procedure.  If you use a CPAP at night, you may bring your mask/headgear for your overnight stay.   You will be asked to remove any contacts, glasses, piercing's, hearing aid's, dentures/partials prior to surgery. Please bring cases for these items if needed.    Patients discharged the day of surgery will not be allowed to drive home, and someone needs to stay with them for 24 hours.  SURGICAL WAITING ROOM VISITATION Patients may have no more than 2 support people in the waiting area - these visitors may rotate.   Pre-op nurse will coordinate an  appropriate time for 1 ADULT support person, who may not rotate, to accompany patient in pre-op.  Children under the age of 49 must have an adult with them who is not the patient and must remain in the main waiting area with an adult.  If the patient needs to stay at the hospital during part of their recovery, the visitor guidelines for inpatient rooms apply.  Please refer to the [facility] website for the visitor guidelines for any additional information.   If you received a COVID test during your pre-op visit  it is requested that you wear a mask when out in public, stay away from anyone that may not be feeling well and notify your surgeon if you develop symptoms. If you have been in contact with anyone that has tested positive in the last 10 days please notify you surgeon.      Pre-operative 5 CHG Bathing Instructions   You can play a key role in reducing the risk of infection after surgery. Your skin needs to be as free of germs as possible. You can reduce the number of germs on your skin by washing with CHG (chlorhexidine  gluconate) soap before surgery. CHG is an antiseptic soap that kills germs and continues to kill germs even after washing.   DO NOT use if you have an allergy to chlorhexidine /CHG or antibacterial soaps. If your skin becomes reddened or irritated, stop using the CHG and notify one of our RNs at  [phone].   Please shower with the CHG soap starting 4 days before surgery using the following schedule:     Please keep in mind the following:  DO NOT shave, including legs and underarms, starting the day of your first shower.   You may shave your face at any point before/day of surgery.  Place clean sheets on your bed the day you start using CHG soap. Use a clean washcloth (not used since being washed) for each shower. DO NOT sleep with pets once you start using the CHG.   CHG Shower Instructions:  Wash your face and private area with normal soap. If you choose to  wash your hair, wash first with your normal shampoo.  After you use shampoo/soap, rinse your hair and body thoroughly to remove shampoo/soap residue.  Turn the water  OFF and apply about 3 tablespoons (45 ml) of CHG soap to a CLEAN washcloth.  Apply CHG soap ONLY FROM YOUR NECK DOWN TO YOUR TOES (washing for 3-5 minutes)  DO NOT use CHG soap on face, private areas, open wounds, or sores.  Pay special attention to the area where your surgery is being performed.  If you are having back surgery, having someone wash your back for you may be helpful. Wait 2 minutes after CHG soap is applied, then you may rinse off the CHG soap.  Pat dry with a clean towel  Put on clean clothes/pajamas   If you choose to wear lotion, please use ONLY the CHG-compatible lotions that are listed below.  Additional instructions for the day of surgery: DO NOT APPLY any lotions, deodorants, cologne, or perfumes.   Do not bring valuables to the hospital. [facility] is not responsible for any belongings/valuables. Do not wear nail polish, gel polish, artificial nails, or any other type of covering on natural nails (fingers and toes) Do not wear jewelry or makeup Put on clean/comfortable clothes.  Please brush your teeth.  Ask your nurse before applying any prescription medications to the skin.     CHG Compatible Lotions   Aveeno Moisturizing lotion  Cetaphil Moisturizing Cream  Cetaphil Moisturizing Lotion  Clairol Herbal Essence Moisturizing Lotion, Dry Skin  Clairol Herbal Essence Moisturizing Lotion, Extra Dry Skin  Clairol Herbal Essence Moisturizing Lotion, Normal Skin  Curel Age Defying Therapeutic Moisturizing Lotion with Alpha Hydroxy  Curel Extreme Care Body Lotion  Curel Soothing Hands Moisturizing Hand Lotion  Curel Therapeutic Moisturizing Cream, Fragrance-Free  Curel Therapeutic Moisturizing Lotion, Fragrance-Free  Curel Therapeutic Moisturizing Lotion, Original Formula  Eucerin Daily Replenishing  Lotion  Eucerin Dry Skin Therapy Plus Alpha Hydroxy Crme  Eucerin Dry Skin Therapy Plus Alpha Hydroxy Lotion  Eucerin Original Crme  Eucerin Original Lotion  Eucerin Plus Crme Eucerin Plus Lotion  Eucerin TriLipid Replenishing Lotion  Keri Anti-Bacterial Hand Lotion  Keri Deep Conditioning Original Lotion Dry Skin Formula Softly Scented  Keri Deep Conditioning Original Lotion, Fragrance Free Sensitive Skin Formula  Keri Lotion Fast Absorbing Fragrance Free Sensitive Skin Formula  Keri Lotion Fast Absorbing Softly Scented Dry Skin Formula  Keri Original Lotion  Keri Skin Renewal Lotion Keri Silky Smooth Lotion  Keri Silky Smooth Sensitive Skin Lotion  Nivea Body Creamy Conditioning Oil  Nivea Body Extra Enriched Teacher, adult education Moisturizing Lotion Nivea Crme  Nivea Skin Firming Lotion  NutraDerm 30 Skin Lotion  NutraDerm Skin Lotion  NutraDerm Therapeutic Skin Cream  NutraDerm Therapeutic Skin Lotion  ProShield Protective Hand Cream  Provon moisturizing lotion  Please read over the following fact sheets that you were given.

## 2024-06-11 ENCOUNTER — Encounter (HOSPITAL_COMMUNITY): Payer: Self-pay

## 2024-06-11 ENCOUNTER — Other Ambulatory Visit: Payer: Self-pay

## 2024-06-11 ENCOUNTER — Encounter (HOSPITAL_COMMUNITY)
Admission: RE | Admit: 2024-06-11 | Discharge: 2024-06-11 | Disposition: A | Discharge status: Home / Self Care | Source: Ambulatory Visit | Attending: Neurological Surgery

## 2024-06-11 VITALS — BP 147/60 | HR 63 | Temp 98.3°F | Resp 18 | Ht 71.0 in | Wt 243.3 lb

## 2024-06-11 DIAGNOSIS — Z01818 Encounter for other preprocedural examination: Secondary | ICD-10-CM | POA: Diagnosis not present

## 2024-06-11 DIAGNOSIS — I2510 Atherosclerotic heart disease of native coronary artery without angina pectoris: Secondary | ICD-10-CM

## 2024-06-11 DIAGNOSIS — I440 Atrioventricular block, first degree: Secondary | ICD-10-CM | POA: Insufficient documentation

## 2024-06-11 DIAGNOSIS — I4892 Unspecified atrial flutter: Secondary | ICD-10-CM | POA: Diagnosis not present

## 2024-06-11 DIAGNOSIS — I493 Ventricular premature depolarization: Secondary | ICD-10-CM | POA: Diagnosis not present

## 2024-06-11 DIAGNOSIS — I10 Essential (primary) hypertension: Secondary | ICD-10-CM | POA: Diagnosis not present

## 2024-06-11 HISTORY — DX: Atherosclerotic heart disease of native coronary artery without angina pectoris: I25.10

## 2024-06-11 LAB — BASIC METABOLIC PANEL WITH GFR
Anion gap: 10 (ref 5–15)
BUN: 35 mg/dL — ABNORMAL HIGH (ref 8–23)
CO2: 24 mmol/L (ref 22–32)
Calcium: 9.9 mg/dL (ref 8.9–10.3)
Chloride: 105 mmol/L (ref 98–111)
Creatinine, Ser: 1.33 mg/dL — ABNORMAL HIGH (ref 0.61–1.24)
GFR, Estimated: 54 mL/min — ABNORMAL LOW (ref >60)
Glucose, Bld: 117 mg/dL — ABNORMAL HIGH (ref 70–99)
Potassium: 4.6 mmol/L (ref 3.5–5.1)
Sodium: 139 mmol/L (ref 135–145)

## 2024-06-11 LAB — TYPE AND SCREEN
ABO/RH(D): O NEG
Antibody Screen: NEGATIVE

## 2024-06-11 LAB — SURGICAL PCR SCREEN
MRSA, PCR: NEGATIVE
Staphylococcus aureus: NEGATIVE

## 2024-06-11 LAB — CBC
HCT: 39 % (ref 39.0–52.0)
Hemoglobin: 12.9 g/dL — ABNORMAL LOW (ref 13.0–17.0)
MCH: 32.7 pg (ref 26.0–34.0)
MCHC: 33.1 g/dL (ref 30.0–36.0)
MCV: 99 fL (ref 80.0–100.0)
Platelets: 177 10*3/uL (ref 150–400)
RBC: 3.94 MIL/uL — ABNORMAL LOW (ref 4.22–5.81)
RDW: 13.5 % (ref 11.5–15.5)
WBC: 7.2 10*3/uL (ref 4.0–10.5)
nRBC: 0 % (ref 0.0–0.2)

## 2024-06-11 NOTE — Progress Notes (Signed)
 PCP - Dr. Ardell Manly Cardiologist - Dr. Vina Gull  PPM/ICD - denies   Chest x-ray - 08/31/2007 EKG - 06/11/24 Stress Test - 10/31/17 ECHO - 09/02/22 Cardiac Cath - 07/01/21  Sleep Study - denies   DM- denies  Last dose of GLP1 agonist-  n/a   Blood Thinner Instructions: n/a Aspirin  Instructions: hold 1 week. Pt took last dose 6/23  ERAS Protcol - no, NPO   COVID TEST- n/a   Anesthesia review: yes, cardiac hx  Patient denies shortness of breath, fever, cough and chest pain at PAT appointment   All instructions explained to the patient, with a verbal understanding of the material. Patient agrees to go over the instructions while at home for a better understanding.The opportunity to ask questions was provided.

## 2024-06-12 ENCOUNTER — Telehealth: Payer: Self-pay | Admitting: *Deleted

## 2024-06-12 NOTE — Telephone Encounter (Signed)
   Name: CAILEAN HEACOCK  DOB: 1946/09/01  MRN: [id number]  Primary Cardiologist: Vina Gull, MD  Chart reviewed as part of pre-operative protocol coverage. Because of SAUL FABIANO past medical history and time since last visit, he will require a follow-up in-office visit in order to better assess preoperative cardiovascular risk.  Pre-op covering staff: - Please schedule appointment and call patient to inform them. If patient already had an upcoming appointment within acceptable timeframe, please add pre-op clearance to the appointment notes so provider is aware. - Please contact requesting surgeon's office via preferred method (i.e, phone, fax) to inform them of need for appointment prior to surgery.  If he does not have CAD, may start holding ASA now.  Destinie Thornsberry, [place]  06/12/2024, 3:05 PM

## 2024-06-12 NOTE — Telephone Encounter (Signed)
 Update: pt called back while I was noting in his chart and he has scheduled an appt 06/13/24 with Barnie Hila, NP in our [place] office. I have cancelled the in office 06/17/24 with Rosaline Bane, NP that I was holding.   I will update the surgery scheduler pt has appt tomorrow 06/13/24.  I will update all parties involved.

## 2024-06-12 NOTE — Progress Notes (Unsigned)
 Cardiology Clinic Note   Date: 06/13/2024 ID: [name] [id number], MRN [id number]  Primary Cardiologist:  Tony Gull, MD  Chief Complaint   [name] is a 78 y.o. male who presents to the clinic today for preoperative risk assessment.   Patient Profile   [name] is followed by Dr. Gull for the history outlined below.      Past medical history significant for: CAD. Coronary CTA with FFR 06/14/2021: Calcium  score 1948.  Heavy multivessel calcifications with moderate to severe CAD of the LAD.  FFR analysis demonstrated flow-limiting stenosis of the proximal to mid LAD. R/LHC 06/18/2021: Ostial proximal RCA 50%.  Mid RCA 40%.  Distal RCA 30%.  Proximal to mid LAD 80%.  Mid LAD 80%. Staged PCI 07/01/2021: PCI with orbital atherectomy and overlapping DES x 3 (2.75 x 12, 2.5 x 15, 2.25 x 8) to mid LAD. Echo 09/02/2022: EF 55 to 60%.  No RWMA.  Grade 1 DD.  Normal RV size/function.  Trivial MR.  Aortic valve sclerosis without stenosis.  Dilated IVC, RA pressure 8 mmHg. Paroxysmal a-flutter. Hypertension. Hyperlipidemia. Lipids checked by PCP.   In summary, patient has a remote history of paroxysmal a-flutter not on anticoagulation.  He was seen in the clinic in June 2022 and noted exertional dyspnea.  Coronary CTA demonstrated flow-limiting stenosis of the proximal to mid LAD confirmed by FFR.  He underwent LHC which demonstrated severe heavily calcified proximal and mid LAD stenosis limiting flow.  Given complex heavily calcified CAD decision was made for patient to undergo staged PCI with orbital atherectomy and PCI with 3 overlapping stents to LAD.  Echo in September 2023 demonstrated normal LV/RV function.  Patient was last seen in the office by Tony Ferrier, PA-C on 06/20/2023 for routine follow-up.  He was doing well from a cardiac standpoint.  He reported significant bruising on Plavix  and requested to discontinue this medication.  [name] reached out to primary  cardiologist and [name] who performed PCI and patient was changed to aspirin  monotherapy.     History of Present Illness    Today, patient is here alone. He is doing well. Patient denies shortness of breath, dyspnea on exertion, orthopnea or PND. Mild lower extremity edema.  No chest pain, pressure, or tightness. No palpitations. He is very active going to [employer] 3 days a week to use the elliptical and strength train. He has a long history of back with multiple surgeries in the past. He is now pending another lumber fusion. He modifies his exercises to protect his back. He has still been able to go to the gym 3 days a week despite his back pain and neuropathy. He reports history of aflutter was 5 years ago. It was never captured, as it had resolved by the time he reached the hospital. He has never had a recurrence.     ROS: All other systems reviewed and are otherwise negative except as noted in History of Present Illness.  EKGs/Labs Reviewed       EKG reviewed from 06/11/2024 and showed NSR with sinus arrhythmia. EKG not repeated today.   06/11/2024: BUN 35; Creatinine, Ser 1.33; Potassium 4.6; Sodium 139   06/11/2024: Hemoglobin 12.9; WBC 7.2    Physical Exam    VS:  BP 134/78   Pulse 76   Ht 5' 11 (1.803 m)   Wt 242 lb 12.8 oz (110.1 kg)   SpO2 95%   BMI 33.86 kg/m  ,  BMI Body mass index is 33.86 kg/m.  GEN: Well nourished, well developed, in no acute distress. Neck: No JVD or carotid bruits. Cardiac:  RRR. No murmur. No rubs or gallops.   Respiratory:  Respirations regular and unlabored. Clear to auscultation without rales, wheezing or rhonchi. GI: Soft, nontender, nondistended. Extremities: Radials/DP/PT 2+ and equal bilaterally. No clubbing or cyanosis. Mild nonpitting edema bilateral lower extremities.  Skin: Warm and dry, no rash. Neuro: Strength intact.  Assessment & Plan   CAD S/p staged PCI with orbital atherectomy and overlapping DES x 3 to  mid LAD July 2022.  Patient denies chest pain, pressure or tightness. He is very active going to the gym 3 days a week.  - Continue aspirin , rosuvastatin , as needed SL NTG.  Hypertension BP today 134/78. No complaints of dizziness or headaches. Home BP typically 133-135/60. - Continue amlodipine , benazepril , hydrochlorothiazide .  Hyperlipidemia Lipids are checked by PCP and reported as well controlled.  - Continue rosuvastatin . - Continue to follow with PCP.   Episode of aflutter Patient reports episode of aflutter that was never captured, as it had resolved by the time he got to the hospital. He has never had a recurrence.  - Continue to monitor.   Preoperative cardiovascular risk assessment Lumbar fusion by Dr. Colon on 06/18/2024.  According to the RCRI, patient has a 0.4-3.9% risk of MACE. Patient reports activity equivalent to >4.0 METS (goes to the gym 3 days week for elliptical and strength training).  -Based on ACC/AHA guidelines, [name] would be at acceptable risk for the planned procedure without further cardiovascular testing.  - Patient has already been instructed to hold aspirin  starting 2 days ago.   Disposition: Return in 1 year or sooner as needed.          Signed, Tony HERO. Cassia Fein, DNP, NP-C

## 2024-06-12 NOTE — Telephone Encounter (Signed)
 Per preop APP pt will need to be seen in the office as he was last seen 06/2023. Per Edsel MORTON CMA ok to use time slot 06/17/24 @ 2:45. I have left very detailed message for the pt to call back to preop team to discuss details further. I will update the requesting office as well we are holding an appt time for pt. When pt calls back we will discuss about ASA as well.

## 2024-06-12 NOTE — Telephone Encounter (Signed)
   Pre-operative Risk Assessment    Patient Name: [name]  DOB: 29-Aug-1946 MRN: [id number]   Date of last office visit: 06/20/23 [name], [name] Date of next office visit: NONE   Request for Surgical Clearance    Procedure:  LUMBAR FUSION  Date of Surgery:  Clearance 06/18/24                                Surgeon:  DR. VICTORY GENS Surgeon's Group or Practice Name:  [name] Phone number:  [phone] [name] Fax number:  [phone]    Type of Clearance Requested:   - Medical  - Pharmacy:  Hold Aspirin      Type of Anesthesia:  General    Additional requests/questions:    [name]   06/12/2024, 2:49 PM

## 2024-06-13 ENCOUNTER — Ambulatory Visit: Attending: Student | Admitting: Student

## 2024-06-13 ENCOUNTER — Encounter: Payer: Self-pay | Admitting: Student

## 2024-06-13 ENCOUNTER — Ambulatory Visit: Admitting: Student

## 2024-06-13 VITALS — BP 134/78 | HR 76 | Ht 71.0 in | Wt 242.8 lb

## 2024-06-13 DIAGNOSIS — Z8679 Personal history of other diseases of the circulatory system: Secondary | ICD-10-CM

## 2024-06-13 DIAGNOSIS — E785 Hyperlipidemia, unspecified: Secondary | ICD-10-CM | POA: Diagnosis not present

## 2024-06-13 DIAGNOSIS — I10 Essential (primary) hypertension: Secondary | ICD-10-CM | POA: Diagnosis not present

## 2024-06-13 DIAGNOSIS — Z01810 Encounter for preprocedural cardiovascular examination: Secondary | ICD-10-CM

## 2024-06-13 DIAGNOSIS — I2510 Atherosclerotic heart disease of native coronary artery without angina pectoris: Secondary | ICD-10-CM | POA: Diagnosis not present

## 2024-06-13 NOTE — Patient Instructions (Signed)
 Medication Instructions:  Your physician recommends that you continue on your current medications as directed. Please refer to the Current Medication list given to you today.  *If you need a refill on your cardiac medications before your next appointment, please call your pharmacy*  Lab Work: No labs ordered today  If you have labs (blood work) drawn today and your tests are completely normal, you will receive your results only by: MyChart Message (if you have MyChart) OR A paper copy in the mail If you have any lab test that is abnormal or we need to change your treatment, we will call you to review the results.  Testing/Procedures: No test ordered today   Follow-Up: At [facility], you and your health needs are our priority.  As part of our continuing mission to provide you with exceptional heart care, our providers are all part of one team.  This team includes your primary Cardiologist (physician) and Advanced Practice Providers or APPs (Physician Assistants and Nurse Practitioners) who all work together to provide you with the care you need, when you need it.  Your next appointment:   12 month(s)  Provider:   You may see Vina Gull, MD or one of the following Advanced Practice Providers on your designated Care Team:   Lonni Meager, NP Lesley Maffucci, PA-C Bernardino Bring, PA-C Cadence Seville, PA-C Tylene Lunch, NP Barnie Hila, NP    We recommend signing up for the patient portal called MyChart.  Sign up information is provided on this After Visit Summary.  MyChart is used to connect with patients for Virtual Visits (Telemedicine).  Patients are able to view lab/test results, encounter notes, upcoming appointments, etc.  Non-urgent messages can be sent to your provider as well.   To learn more about what you can do with MyChart, go to [email or web address].

## 2024-06-17 ENCOUNTER — Ambulatory Visit (HOSPITAL_BASED_OUTPATIENT_CLINIC_OR_DEPARTMENT_OTHER): Admitting: Nurse Practitioner

## 2024-06-17 NOTE — Progress Notes (Signed)
 Called and spoke to patient letting him know to report at 1030 for his 1235 surgery and not to eat / drink anything after midnight.

## 2024-06-17 NOTE — Anesthesia Preprocedure Evaluation (Addendum)
 Anesthesia Evaluation  Patient identified by MRN, date of birth, ID band Patient awake    Reviewed: Allergy & Precautions, NPO status , Patient's Chart, lab work & pertinent test results  History of Anesthesia Complications Negative for: history of anesthetic complications  Airway Mallampati: IV  TM Distance: >3 FB Neck ROM: Limited    Dental  (+) Dental Advisory Given   Pulmonary neg COPD, neg recent URI   breath sounds clear to auscultation       Cardiovascular hypertension, Pt. on medications (-) angina + CAD and + Cardiac Stents  + dysrhythmias  Rhythm:Regular    Prox LAD to Mid LAD lesion is 80% stenosed.   Mid LAD lesion is 80% stenosed.   A drug-eluting stent was successfully placed using a STENT ONYX FRONTIER 2.75X12, STENT ONYX FRONTIER 2.5 x 15, STENT ONYX FRONTIER 2.25 x 8.   Post intervention, there is a 0% residual stenosis.   Post intervention, there is a 0% residual stenosis.   Severe mid LAD stenosis Successful orbital atherectomy mid LAD with placement of 3 overlapping drug eluting stents.    1. Left ventricular ejection fraction, by estimation, is 55 to 60%. The  left ventricle has normal function. The left ventricle has no regional  wall motion abnormalities. The left ventricular internal cavity size was  mildly dilated. Left ventricular  diastolic parameters are consistent with Grade I diastolic dysfunction  (impaired relaxation).   2. Right ventricular systolic function is normal. The right ventricular  size is normal. Tricuspid regurgitation signal is inadequate for assessing  PA pressure.   3. The mitral valve is grossly normal. Trivial mitral valve  regurgitation. No evidence of mitral stenosis.   4. The aortic valve is tricuspid. There is mild calcification of the  aortic valve. Aortic valve regurgitation is not visualized. Aortic valve  sclerosis is present, with no evidence of aortic valve  stenosis.   5. The inferior vena cava is dilated in size with >50% respiratory  variability, suggesting right atrial pressure of 8 mmHg.     Neuro/Psych  Neuromuscular disease    GI/Hepatic negative GI ROS, Neg liver ROS,,,  Endo/Other  negative endocrine ROS    Renal/GU negative Renal ROS     Musculoskeletal  (+) Arthritis ,    Abdominal   Peds  Hematology negative hematology ROS (+)   Anesthesia Other Findings   Reproductive/Obstetrics                              Anesthesia Physical Anesthesia Plan  ASA: 3  Anesthesia Plan: General   Post-op Pain Management:    Induction: Intravenous and Rapid sequence  PONV Risk Score and Plan: 3 and Ondansetron  and Dexamethasone   Airway Management Planned: Oral ETT and Video Laryngoscope Planned  Additional Equipment: None  Intra-op Plan:   Post-operative Plan: Extubation in OR  Informed Consent: I have reviewed the patients History and Physical, chart, labs and discussed the procedure including the risks, benefits and alternatives for the proposed anesthesia with the patient or authorized representative who has indicated his/her understanding and acceptance.     Dental advisory given  Plan Discussed with: CRNA  Anesthesia Plan Comments: (PAT note by Lynwood Hope, PA-C:  78 yo male follows with cardiology for hx of HTN, HLD, remote paroxysmal aflutter not on [facility], CAD s/p DES x 3 to LAD 06/2021. Echo 09/02/2022: EF 55 to 60%. No RWMA. Grade 1 DD. Normal RV size/function.  Trivial MR. Aortic valve sclerosis without stenosis. Dilated IVC, RA pressure 8 mmHg.  Seen by Barnie Hila, NP 06/13/2024 for preop evaluation.  Per note, Lumbar fusion by Dr. Colon on 06/18/2024.  According to the RCRI, patient has a 0.4-3.9% risk of MACE. Patient reports activity equivalent to >4.0 METS (goes to the gym 3 days week for elliptical and strength training).  -Based on ACC/AHA guidelines, [name] would be  at acceptable risk for the planned procedure without further cardiovascular testing.  - Patient has already been instructed to hold aspirin  starting 2 days ago  History of prior posterior cervical fusion skull base to C3 and anterior fusion C4-C7.  Elective GlideScope has been used for recent intubations.  Preop labs reviewed, creatinine mildly elevated at 1.33, mild anemia hemoglobin 12.9, otherwise unremarkable.  EKG 06/11/24: Normal sinus rhythm with sinus arrhythmia. Rate 67. First degree heart block  TTE 09/02/22: 1. Left ventricular ejection fraction, by estimation, is 55 to 60%. The  left ventricle has normal function. The left ventricle has no regional  wall motion abnormalities. The left ventricular internal cavity size was  mildly dilated. Left ventricular  diastolic parameters are consistent with Grade I diastolic dysfunction  (impaired relaxation).   2. Right ventricular systolic function is normal. The right ventricular  size is normal. Tricuspid regurgitation signal is inadequate for assessing  PA pressure.   3. The mitral valve is grossly normal. Trivial mitral valve  regurgitation. No evidence of mitral stenosis.   4. The aortic valve is tricuspid. There is mild calcification of the  aortic valve. Aortic valve regurgitation is not visualized. Aortic valve  sclerosis is present, with no evidence of aortic valve stenosis.   5. The inferior vena cava is dilated in size with >50% respiratory  variability, suggesting right atrial pressure of 8 mmHg.   Cath/PCI 07/01/21:   Prox LAD to Mid LAD lesion is 80% stenosed.   Mid LAD lesion is 80% stenosed.   A drug-eluting stent was successfully placed using a STENT ONYX FRONTIER 2.75X12, STENT ONYX FRONTIER 2.5 x 15, STENT ONYX FRONTIER 2.25 x 8.   Post intervention, there is a 0% residual stenosis.   Post intervention, there is a 0% residual stenosis.   Severe mid LAD stenosis Successful orbital atherectomy mid LAD with  placement of 3 overlapping drug eluting stents.    Recommendations: Continue DAPT with ASA and Plavix  for at least 12 months given long overlapping stent segment. Will discharge home today after 6 hours bedrest.    )         Anesthesia Quick Evaluation

## 2024-06-17 NOTE — Progress Notes (Addendum)
 Anesthesia Chart Review:  78 yo male follows with cardiology for hx of HTN, HLD, remote paroxysmal aflutter not on [facility], CAD s/p DES x 3 to LAD 06/2021. Echo 09/02/2022: EF 55 to 60%. No RWMA. Grade 1 DD. Normal RV size/function. Trivial MR. Aortic valve sclerosis without stenosis. Dilated IVC, RA pressure 8 mmHg.  Seen by Barnie Hila, NP 06/13/2024 for preop evaluation.  Per note, Lumbar fusion by Dr. Colon on 06/18/2024.  According to the RCRI, patient has a 0.4-3.9% risk of MACE. Patient reports activity equivalent to >4.0 METS (goes to the gym 3 days week for elliptical and strength training).  -Based on ACC/AHA guidelines, [name] would be at acceptable risk for the planned procedure without further cardiovascular testing.  - Patient has already been instructed to hold aspirin  starting 2 days ago  History of prior posterior cervical fusion skull base to C3 and anterior fusion C4-C7.  Elective GlideScope has been used for recent intubations.  Preop labs reviewed, creatinine mildly elevated at 1.33, mild anemia hemoglobin 12.9, otherwise unremarkable.  EKG 06/11/24: Normal sinus rhythm with sinus arrhythmia. Rate 67. First degree heart block  TTE 09/02/22: 1. Left ventricular ejection fraction, by estimation, is 55 to 60%. The  left ventricle has normal function. The left ventricle has no regional  wall motion abnormalities. The left ventricular internal cavity size was  mildly dilated. Left ventricular  diastolic parameters are consistent with Grade I diastolic dysfunction  (impaired relaxation).   2. Right ventricular systolic function is normal. The right ventricular  size is normal. Tricuspid regurgitation signal is inadequate for assessing  PA pressure.   3. The mitral valve is grossly normal. Trivial mitral valve  regurgitation. No evidence of mitral stenosis.   4. The aortic valve is tricuspid. There is mild calcification of the  aortic valve. Aortic valve regurgitation is  not visualized. Aortic valve  sclerosis is present, with no evidence of aortic valve stenosis.   5. The inferior vena cava is dilated in size with >50% respiratory  variability, suggesting right atrial pressure of 8 mmHg.   Cath/PCI 07/01/21:   Prox LAD to Mid LAD lesion is 80% stenosed.   Mid LAD lesion is 80% stenosed.   A drug-eluting stent was successfully placed using a STENT ONYX FRONTIER 2.75X12, STENT ONYX FRONTIER 2.5 x 15, STENT ONYX FRONTIER 2.25 x 8.   Post intervention, there is a 0% residual stenosis.   Post intervention, there is a 0% residual stenosis.   Severe mid LAD stenosis Successful orbital atherectomy mid LAD with placement of 3 overlapping drug eluting stents.    Recommendations: Continue DAPT with ASA and Plavix  for at least 12 months given long overlapping stent segment. Will discharge home today after 6 hours bedrest.      [name] [facility] Short Stay Center/Anesthesiology Phone [phone] 06/17/2024 2:10 PM

## 2024-06-18 ENCOUNTER — Inpatient Hospital Stay (HOSPITAL_COMMUNITY)
Admission: RE | Admit: 2024-06-18 | Discharge: 2024-06-20 | DRG: 448 | Disposition: A | Discharge status: Home / Self Care | Attending: Neurological Surgery | Admitting: Neurological Surgery

## 2024-06-18 ENCOUNTER — Inpatient Hospital Stay (HOSPITAL_COMMUNITY): Payer: Self-pay | Admitting: Certified Registered"

## 2024-06-18 ENCOUNTER — Inpatient Hospital Stay (HOSPITAL_COMMUNITY)

## 2024-06-18 ENCOUNTER — Inpatient Hospital Stay (HOSPITAL_COMMUNITY): Payer: Self-pay | Admitting: Physician Assistant

## 2024-06-18 ENCOUNTER — Encounter (HOSPITAL_COMMUNITY): Payer: Self-pay | Admitting: Neurological Surgery

## 2024-06-18 ENCOUNTER — Other Ambulatory Visit: Payer: Self-pay

## 2024-06-18 ENCOUNTER — Encounter (HOSPITAL_COMMUNITY)
Admission: RE | Disposition: A | Discharge status: Home / Self Care | Payer: Self-pay | Source: Home / Self Care | Attending: Neurological Surgery

## 2024-06-18 DIAGNOSIS — I10 Essential (primary) hypertension: Secondary | ICD-10-CM | POA: Diagnosis not present

## 2024-06-18 DIAGNOSIS — Z981 Arthrodesis status: Secondary | ICD-10-CM

## 2024-06-18 DIAGNOSIS — E7800 Pure hypercholesterolemia, unspecified: Secondary | ICD-10-CM

## 2024-06-18 DIAGNOSIS — I2510 Atherosclerotic heart disease of native coronary artery without angina pectoris: Secondary | ICD-10-CM | POA: Diagnosis not present

## 2024-06-18 DIAGNOSIS — Z79899 Other long term (current) drug therapy: Secondary | ICD-10-CM

## 2024-06-18 DIAGNOSIS — Z96653 Presence of artificial knee joint, bilateral: Secondary | ICD-10-CM | POA: Diagnosis present

## 2024-06-18 DIAGNOSIS — Z955 Presence of coronary angioplasty implant and graft: Secondary | ICD-10-CM

## 2024-06-18 DIAGNOSIS — Z91041 Radiographic dye allergy status: Secondary | ICD-10-CM | POA: Diagnosis not present

## 2024-06-18 DIAGNOSIS — M4726 Other spondylosis with radiculopathy, lumbar region: Secondary | ICD-10-CM | POA: Diagnosis present

## 2024-06-18 DIAGNOSIS — M48062 Spinal stenosis, lumbar region with neurogenic claudication: Secondary | ICD-10-CM | POA: Diagnosis not present

## 2024-06-18 DIAGNOSIS — Z888 Allergy status to other drugs, medicaments and biological substances status: Secondary | ICD-10-CM

## 2024-06-18 DIAGNOSIS — M40299 Other kyphosis, site unspecified: Secondary | ICD-10-CM | POA: Diagnosis present

## 2024-06-18 DIAGNOSIS — Z860100 Personal history of colon polyps, unspecified: Secondary | ICD-10-CM | POA: Diagnosis not present

## 2024-06-18 DIAGNOSIS — Z7982 Long term (current) use of aspirin: Secondary | ICD-10-CM

## 2024-06-18 DIAGNOSIS — M48061 Spinal stenosis, lumbar region without neurogenic claudication: Secondary | ICD-10-CM

## 2024-06-18 DIAGNOSIS — E785 Hyperlipidemia, unspecified: Secondary | ICD-10-CM | POA: Diagnosis present

## 2024-06-18 DIAGNOSIS — M4316 Spondylolisthesis, lumbar region: Secondary | ICD-10-CM | POA: Diagnosis not present

## 2024-06-18 DIAGNOSIS — Z8249 Family history of ischemic heart disease and other diseases of the circulatory system: Secondary | ICD-10-CM

## 2024-06-18 DIAGNOSIS — M4716 Other spondylosis with myelopathy, lumbar region: Secondary | ICD-10-CM | POA: Diagnosis not present

## 2024-06-18 DIAGNOSIS — I483 Typical atrial flutter: Secondary | ICD-10-CM | POA: Diagnosis not present

## 2024-06-18 DIAGNOSIS — I480 Paroxysmal atrial fibrillation: Secondary | ICD-10-CM | POA: Diagnosis not present

## 2024-06-18 HISTORY — PX: LUMBAR PERCUTANEOUS PEDICLE SCREW 2 LEVEL: SHX5561

## 2024-06-18 HISTORY — PX: ANTERIOR LAT LUMBAR FUSION: SHX1168

## 2024-06-18 SURGERY — ANTERIOR LATERAL LUMBAR FUSION 2 LEVELS
Anesthesia: General | Site: Spine Lumbar

## 2024-06-18 MED ORDER — DEXAMETHASONE SODIUM PHOSPHATE 10 MG/ML IJ SOLN
INTRAMUSCULAR | Status: DC | PRN
  Administered 2024-06-18: 5 mg via INTRAVENOUS

## 2024-06-18 MED ORDER — LACTATED RINGERS IV SOLN
INTRAVENOUS | Status: DC | PRN
Start: 2024-06-18 — End: 2024-06-18

## 2024-06-18 MED ORDER — ACETAMINOPHEN 325 MG PO TABS
650.0000 mg | ORAL_TABLET | ORAL | Status: DC | PRN
  Filled 2024-06-18: qty 2

## 2024-06-18 MED ORDER — MENTHOL 3 MG MT LOZG: 1.0000 | LOZENGE | OROMUCOSAL | Status: DC | PRN

## 2024-06-18 MED ORDER — ONDANSETRON HCL 4 MG/2ML IJ SOLN
INTRAMUSCULAR | Status: DC | PRN
  Administered 2024-06-18: 4 mg via INTRAVENOUS

## 2024-06-18 MED ORDER — EPHEDRINE SULFATE-NACL 50-0.9 MG/10ML-% IV SOSY
PREFILLED_SYRINGE | INTRAVENOUS | Status: DC | PRN
  Administered 2024-06-18: 5 mg via INTRAVENOUS
  Administered 2024-06-18: 7.5 mg via INTRAVENOUS
  Administered 2024-06-18: 10 mg via INTRAVENOUS
  Administered 2024-06-18: 7.5 mg via INTRAVENOUS

## 2024-06-18 MED ORDER — THROMBIN 5000 UNITS EX SOLR
OROMUCOSAL | Status: DC | PRN
  Administered 2024-06-18: 5 mL via TOPICAL

## 2024-06-18 MED ORDER — SODIUM CHLORIDE 0.9% FLUSH
3.0000 mL | Freq: Two times a day (BID) | INTRAVENOUS | Status: DC
  Administered 2024-06-18 – 2024-06-20 (×4): 3 mL via INTRAVENOUS

## 2024-06-18 MED ORDER — EPHEDRINE 5 MG/ML INJ
INTRAVENOUS | Status: AC
Start: 2024-06-18 — End: 2024-06-18
  Filled 2024-06-18: qty 5

## 2024-06-18 MED ORDER — SODIUM CHLORIDE 0.9 % IV SOLN: 250.0000 mL | INTRAVENOUS | Status: AC

## 2024-06-18 MED ORDER — SODIUM CHLORIDE 0.9% FLUSH: 3.0000 mL | INTRAVENOUS | Status: DC | PRN

## 2024-06-18 MED ORDER — HYDROMORPHONE HCL 1 MG/ML IJ SOLN
1.0000 mg | INTRAMUSCULAR | Status: DC | PRN
  Administered 2024-06-18 – 2024-06-19 (×3): 1 mg via INTRAVENOUS
  Filled 2024-06-18 (×3): qty 1

## 2024-06-18 MED ORDER — PHENOL 1.4 % MT LIQD
1.0000 | OROMUCOSAL | Status: DC | PRN
  Filled 2024-06-18: qty 177

## 2024-06-18 MED ORDER — FENTANYL CITRATE (PF) 250 MCG/5ML IJ SOLN
INTRAMUSCULAR | Status: DC | PRN
  Administered 2024-06-18: 100 ug via INTRAVENOUS

## 2024-06-18 MED ORDER — ACETAMINOPHEN 10 MG/ML IV SOLN: 1000.0000 mg | Freq: Once | INTRAVENOUS | Status: DC | PRN

## 2024-06-18 MED ORDER — CEFAZOLIN SODIUM-DEXTROSE 2-4 GM/100ML-% IV SOLN
2.0000 g | INTRAVENOUS | Status: AC
  Administered 2024-06-18: 2 g via INTRAVENOUS
  Filled 2024-06-18: qty 100

## 2024-06-18 MED ORDER — METHOCARBAMOL 500 MG PO TABS: 500.0000 mg | ORAL_TABLET | Freq: Three times a day (TID) | ORAL | Status: DC | PRN

## 2024-06-18 MED ORDER — OXYCODONE HCL 5 MG PO TABS
5.0000 mg | ORAL_TABLET | ORAL | Status: DC | PRN
  Administered 2024-06-18 – 2024-06-19 (×2): 10 mg via ORAL
  Administered 2024-06-19: 5 mg via ORAL
  Administered 2024-06-19: 10 mg via ORAL
  Administered 2024-06-19 (×2): 5 mg via ORAL
  Administered 2024-06-20: 10 mg via ORAL
  Filled 2024-06-18: qty 2
  Filled 2024-06-18: qty 1
  Filled 2024-06-18 (×4): qty 2
  Filled 2024-06-18: qty 1
  Filled 2024-06-18: qty 2

## 2024-06-18 MED ORDER — ROCURONIUM BROMIDE 10 MG/ML (PF) SYRINGE
PREFILLED_SYRINGE | INTRAVENOUS | Status: AC
  Filled 2024-06-18: qty 10

## 2024-06-18 MED ORDER — FENTANYL CITRATE (PF) 250 MCG/5ML IJ SOLN
INTRAMUSCULAR | Status: AC
  Filled 2024-06-18: qty 5

## 2024-06-18 MED ORDER — METHOCARBAMOL 1000 MG/10ML IJ SOLN
500.0000 mg | Freq: Four times a day (QID) | INTRAMUSCULAR | Status: DC | PRN
Start: 2024-06-18 — End: 2024-06-20

## 2024-06-18 MED ORDER — ONDANSETRON HCL 4 MG PO TABS
4.0000 mg | ORAL_TABLET | Freq: Four times a day (QID) | ORAL | Status: DC | PRN
  Administered 2024-06-19: 4 mg via ORAL
  Filled 2024-06-18: qty 1

## 2024-06-18 MED ORDER — OXYCODONE HCL 5 MG/5ML PO SOLN: 5.0000 mg | Freq: Once | ORAL | Status: DC | PRN

## 2024-06-18 MED ORDER — ACETAMINOPHEN 650 MG RE SUPP: 650.0000 mg | RECTAL | Status: DC | PRN

## 2024-06-18 MED ORDER — DOCUSATE SODIUM 100 MG PO CAPS
100.0000 mg | ORAL_CAPSULE | Freq: Two times a day (BID) | ORAL | Status: DC
  Administered 2024-06-18 – 2024-06-20 (×4): 100 mg via ORAL
  Filled 2024-06-18 (×4): qty 1

## 2024-06-18 MED ORDER — PHENYLEPHRINE HCL-NACL 20-0.9 MG/250ML-% IV SOLN
INTRAVENOUS | Status: DC | PRN
  Administered 2024-06-18: 20 ug/min via INTRAVENOUS

## 2024-06-18 MED ORDER — SUCCINYLCHOLINE CHLORIDE 200 MG/10ML IV SOSY
PREFILLED_SYRINGE | INTRAVENOUS | Status: DC | PRN
  Administered 2024-06-18: 120 mg via INTRAVENOUS

## 2024-06-18 MED ORDER — BUPIVACAINE HCL (PF) 0.5 % IJ SOLN
INTRAMUSCULAR | Status: DC | PRN
  Administered 2024-06-18: 5 mL
  Administered 2024-06-18: 20 mL
  Administered 2024-06-18: 5 mL

## 2024-06-18 MED ORDER — 0.9 % SODIUM CHLORIDE (POUR BTL) OPTIME
TOPICAL | Status: DC | PRN
  Administered 2024-06-18: 1000 mL

## 2024-06-18 MED ORDER — ALUM & MAG HYDROXIDE-SIMETH 200-200-20 MG/5ML PO SUSP
30.0000 mL | Freq: Four times a day (QID) | ORAL | Status: DC | PRN
  Filled 2024-06-18: qty 30

## 2024-06-18 MED ORDER — SUCCINYLCHOLINE CHLORIDE 200 MG/10ML IV SOSY
PREFILLED_SYRINGE | INTRAVENOUS | Status: AC
  Filled 2024-06-18: qty 10

## 2024-06-18 MED ORDER — ONDANSETRON HCL 4 MG/2ML IJ SOLN
INTRAMUSCULAR | Status: AC
Start: 2024-06-18 — End: 2024-06-18
  Filled 2024-06-18: qty 2

## 2024-06-18 MED ORDER — PROPOFOL 10 MG/ML IV BOLUS
INTRAVENOUS | Status: DC | PRN
  Administered 2024-06-18: 130 mg via INTRAVENOUS
  Administered 2024-06-18: 125 ug/kg/min via INTRAVENOUS

## 2024-06-18 MED ORDER — FLEET ENEMA RE ENEM: 1.0000 | ENEMA | Freq: Once | RECTAL | Status: DC | PRN

## 2024-06-18 MED ORDER — BENAZEPRIL HCL 20 MG PO TABS
20.0000 mg | ORAL_TABLET | Freq: Two times a day (BID) | ORAL | Status: DC
  Administered 2024-06-18 – 2024-06-20 (×4): 20 mg via ORAL
  Filled 2024-06-18 (×5): qty 1

## 2024-06-18 MED ORDER — THROMBIN 5000 UNITS EX KIT
PACK | CUTANEOUS | Status: AC
  Filled 2024-06-18: qty 1

## 2024-06-18 MED ORDER — PROPOFOL 10 MG/ML IV BOLUS
INTRAVENOUS | Status: AC
  Filled 2024-06-18: qty 20

## 2024-06-18 MED ORDER — PROPOFOL 1000 MG/100ML IV EMUL
INTRAVENOUS | Status: AC
  Filled 2024-06-18: qty 100

## 2024-06-18 MED ORDER — LACTATED RINGERS IV SOLN: INTRAVENOUS | Status: DC

## 2024-06-18 MED ORDER — DEXAMETHASONE SODIUM PHOSPHATE 10 MG/ML IJ SOLN
INTRAMUSCULAR | Status: AC
Start: 2024-06-18 — End: 2024-06-18
  Filled 2024-06-18: qty 1

## 2024-06-18 MED ORDER — CELECOXIB 200 MG PO CAPS: 200.0000 mg | ORAL_CAPSULE | Freq: Every day | ORAL | Status: DC | PRN

## 2024-06-18 MED ORDER — ROSUVASTATIN CALCIUM 5 MG PO TABS
10.0000 mg | ORAL_TABLET | Freq: Every day | ORAL | Status: DC
  Administered 2024-06-18 – 2024-06-20 (×3): 10 mg via ORAL
  Filled 2024-06-18 (×3): qty 2

## 2024-06-18 MED ORDER — AMLODIPINE BESYLATE 5 MG PO TABS
5.0000 mg | ORAL_TABLET | Freq: Two times a day (BID) | ORAL | Status: DC
  Administered 2024-06-18 – 2024-06-20 (×4): 5 mg via ORAL
  Filled 2024-06-18 (×4): qty 1

## 2024-06-18 MED ORDER — ASPIRIN 81 MG PO TBEC
81.0000 mg | DELAYED_RELEASE_TABLET | Freq: Every day | ORAL | Status: DC
  Administered 2024-06-18 – 2024-06-20 (×3): 81 mg via ORAL
  Filled 2024-06-18 (×3): qty 1

## 2024-06-18 MED ORDER — SODIUM CHLORIDE 0.9 % IV SOLN
0.1000 ug/kg/min | INTRAVENOUS | Status: DC
  Administered 2024-06-18: .1 ug/kg/min via INTRAVENOUS
  Filled 2024-06-18 (×2): qty 2000

## 2024-06-18 MED ORDER — CHLORHEXIDINE GLUCONATE CLOTH 2 % EX PADS: 6.0000 | MEDICATED_PAD | Freq: Once | CUTANEOUS | Status: DC

## 2024-06-18 MED ORDER — HYDROCHLOROTHIAZIDE 12.5 MG PO TABS
12.5000 mg | ORAL_TABLET | Freq: Every day | ORAL | Status: DC
  Administered 2024-06-19 – 2024-06-20 (×2): 12.5 mg via ORAL
  Filled 2024-06-18 (×2): qty 1

## 2024-06-18 MED ORDER — FENTANYL CITRATE (PF) 100 MCG/2ML IJ SOLN
INTRAMUSCULAR | Status: AC
  Filled 2024-06-18: qty 2

## 2024-06-18 MED ORDER — LIDOCAINE-EPINEPHRINE 1 %-1:100000 IJ SOLN
INTRAMUSCULAR | Status: AC
  Filled 2024-06-18: qty 1

## 2024-06-18 MED ORDER — CEFAZOLIN SODIUM-DEXTROSE 2-4 GM/100ML-% IV SOLN
2.0000 g | Freq: Three times a day (TID) | INTRAVENOUS | Status: AC
  Administered 2024-06-18 – 2024-06-19 (×2): 2 g via INTRAVENOUS
  Filled 2024-06-18 (×2): qty 100

## 2024-06-18 MED ORDER — POLYETHYLENE GLYCOL 3350 17 G PO PACK: 17.0000 g | PACK | Freq: Every day | ORAL | Status: DC | PRN

## 2024-06-18 MED ORDER — ORAL CARE MOUTH RINSE: 15.0000 mL | Freq: Once | OROMUCOSAL | Status: AC

## 2024-06-18 MED ORDER — BUPIVACAINE HCL (PF) 0.5 % IJ SOLN
INTRAMUSCULAR | Status: AC
  Filled 2024-06-18: qty 30

## 2024-06-18 MED ORDER — NITROGLYCERIN 0.4 MG SL SUBL: 0.4000 mg | SUBLINGUAL_TABLET | SUBLINGUAL | Status: DC | PRN

## 2024-06-18 MED ORDER — OXYCODONE HCL 5 MG PO TABS: 5.0000 mg | ORAL_TABLET | Freq: Once | ORAL | Status: DC | PRN

## 2024-06-18 MED ORDER — ONDANSETRON HCL 4 MG/2ML IJ SOLN: 4.0000 mg | Freq: Four times a day (QID) | INTRAMUSCULAR | Status: DC | PRN

## 2024-06-18 MED ORDER — SENNA 8.6 MG PO TABS
1.0000 | ORAL_TABLET | Freq: Two times a day (BID) | ORAL | Status: DC
  Administered 2024-06-18 – 2024-06-20 (×4): 8.6 mg via ORAL
  Filled 2024-06-18 (×4): qty 1

## 2024-06-18 MED ORDER — FENTANYL CITRATE (PF) 100 MCG/2ML IJ SOLN
25.0000 ug | INTRAMUSCULAR | Status: DC | PRN
  Administered 2024-06-18 (×2): 50 ug via INTRAVENOUS

## 2024-06-18 MED ORDER — HYDROMORPHONE HCL 1 MG/ML IJ SOLN
INTRAMUSCULAR | Status: DC | PRN
  Administered 2024-06-18: .1 mg via INTRAVENOUS
  Administered 2024-06-18: .3 mg via INTRAVENOUS
  Administered 2024-06-18: .1 mg via INTRAVENOUS

## 2024-06-18 MED ORDER — BISACODYL 10 MG RE SUPP: 10.0000 mg | Freq: Every day | RECTAL | Status: DC | PRN

## 2024-06-18 MED ORDER — METHOCARBAMOL 500 MG PO TABS
500.0000 mg | ORAL_TABLET | Freq: Four times a day (QID) | ORAL | Status: DC | PRN
  Administered 2024-06-18 – 2024-06-20 (×6): 500 mg via ORAL
  Filled 2024-06-18 (×6): qty 1

## 2024-06-18 MED ORDER — LIDOCAINE 2% (20 MG/ML) 5 ML SYRINGE
INTRAMUSCULAR | Status: DC | PRN
  Administered 2024-06-18: 60 mg via INTRAVENOUS

## 2024-06-18 MED ORDER — LIDOCAINE 2% (20 MG/ML) 5 ML SYRINGE
INTRAMUSCULAR | Status: AC
  Filled 2024-06-18: qty 5

## 2024-06-18 MED ORDER — CHLORHEXIDINE GLUCONATE 0.12 % MT SOLN
15.0000 mL | Freq: Once | OROMUCOSAL | Status: AC
  Administered 2024-06-18: 15 mL via OROMUCOSAL
  Filled 2024-06-18: qty 15

## 2024-06-18 MED ORDER — HYDROMORPHONE HCL 1 MG/ML IJ SOLN
INTRAMUSCULAR | Status: AC
  Filled 2024-06-18: qty 0.5

## 2024-06-18 MED ORDER — LIDOCAINE-EPINEPHRINE 1 %-1:100000 IJ SOLN
INTRAMUSCULAR | Status: DC | PRN
Start: 2024-06-18 — End: 2024-06-18
  Administered 2024-06-18 (×2): 5 mL
  Administered 2024-06-18: 10 mL

## 2024-06-18 SURGICAL SUPPLY — 63 items
BAG COUNTER SPONGE SURGICOUNT (BAG) ×4 IMPLANT
BLADE CLIPPER SURG (BLADE) IMPLANT
BONE MATRIX OSTEOCEL PRO LRG (Bone Implant) IMPLANT
BONE MATRIX OSTEOCEL PRO MED (Bone Implant) IMPLANT
BUR 14 MATCH 3 (BUR) IMPLANT
BUR MR8 14 BALL 5 (BUR) IMPLANT
CLIP NEUROVISION LG (NEUROSURGERY SUPPLIES) IMPLANT
CNTNR URN SCR LID CUP LEK RST (MISCELLANEOUS) ×2 IMPLANT
COVER BACK TABLE 60X90IN (DRAPES) ×2 IMPLANT
COVERAGE SUPPORT O-ARM STEALTH (MISCELLANEOUS) ×2 IMPLANT
DERMABOND ADVANCED .7 DNX12 (GAUZE/BANDAGES/DRESSINGS) ×6 IMPLANT
DRAPE C-ARM 42X72 X-RAY (DRAPES) ×4 IMPLANT
DRAPE C-ARMOR (DRAPES) ×4 IMPLANT
DRAPE LAPAROTOMY 100X72X124 (DRAPES) ×4 IMPLANT
DRAPE SHEET LG 3/4 BI-LAMINATE (DRAPES) ×8 IMPLANT
DURAPREP 26ML APPLICATOR (WOUND CARE) ×4 IMPLANT
ELECT NVM5 SURFACE MEP/EMG (ELECTRODE) IMPLANT
ELECTRODE BLDE 4.0 EZ CLN MEGD (MISCELLANEOUS) IMPLANT
ELECTRODE REM PT RTRN 9FT ADLT (ELECTROSURGICAL) ×4 IMPLANT
FEE COVERAGE SUPPORT O-ARM (MISCELLANEOUS) ×2 IMPLANT
GAUZE 4X4 16PLY ~~LOC~~+RFID DBL (SPONGE) IMPLANT
GAUZE SPONGE 4X4 12PLY STRL (GAUZE/BANDAGES/DRESSINGS) IMPLANT
GLOVE BIOGEL PI IND STRL 8.5 (GLOVE) ×6 IMPLANT
GLOVE ECLIPSE 8.0 STRL XLNG CF (GLOVE) ×2 IMPLANT
GLOVE ECLIPSE 8.5 STRL (GLOVE) ×6 IMPLANT
GLOVE EXAM NITRILE XL STR (GLOVE) IMPLANT
GOWN STRL REUS W/ TWL LRG LVL3 (GOWN DISPOSABLE) IMPLANT
GOWN STRL REUS W/ TWL XL LVL3 (GOWN DISPOSABLE) ×6 IMPLANT
GOWN STRL REUS W/TWL 2XL LVL3 (GOWN DISPOSABLE) ×4 IMPLANT
HEMOSTAT POWDER KIT SURGIFOAM (HEMOSTASIS) IMPLANT
KIT BASIN OR (CUSTOM PROCEDURE TRAY) ×4 IMPLANT
KIT DILATOR XLIF 5 (KITS) IMPLANT
KIT SURGICAL ACCESS MAXCESS 4 (KITS) IMPLANT
KIT TURNOVER KIT B (KITS) ×4 IMPLANT
MARKER SKIN DUAL TIP RULER LAB (MISCELLANEOUS) ×2 IMPLANT
MARKER SPHERE PSV REFLC NDI (MISCELLANEOUS) ×10 IMPLANT
MODULUS XLW 10X22X55MM 10 (Spine Construct) IMPLANT
NDL HYPO 25X1 1.5 SAFETY (NEEDLE) ×4 IMPLANT
NEEDLE HYPO 25X1 1.5 SAFETY (NEEDLE) ×4 IMPLANT
NS IRRIG 1000ML POUR BTL (IV SOLUTION) ×4 IMPLANT
PACK LAMINECTOMY NEURO (CUSTOM PROCEDURE TRAY) ×4 IMPLANT
PAD ARMBOARD POSITIONER FOAM (MISCELLANEOUS) ×6 IMPLANT
PATTIES SURGICAL .5 X.5 (GAUZE/BANDAGES/DRESSINGS) ×6 IMPLANT
PATTIES SURGICAL 1X1 (DISPOSABLE) ×6 IMPLANT
PIN BONE FIX 100 (PIN) IMPLANT
PROBE BALL TIP NVM5 SNG USE (NEUROSURGERY SUPPLIES) IMPLANT
RASP 3.0MM (RASP) IMPLANT
ROD RELINE MAS LORD 5.5X100MM (Rod) IMPLANT
ROD RELINE MAS LORD 5.5X90MM (Rod) IMPLANT
SCREW LOCK RELINE 5.5 TULIP (Screw) IMPLANT
SCREW RELINE RED 6.5X45MM POLY (Screw) IMPLANT
SCREW RELINE RED 6.5X50MM POLY (Screw) IMPLANT
SPONGE NEURO XRAY DETECT 1X3 (DISPOSABLE) IMPLANT
SPONGE T-LAP 4X18 ~~LOC~~+RFID (SPONGE) IMPLANT
SUT VIC AB 3-0 SH 8-18 (SUTURE) ×6 IMPLANT
SUT VIC AB 4-0 RB1 18 (SUTURE) ×4 IMPLANT
SYR 30ML SLIP (SYRINGE) ×6 IMPLANT
TAPE CLOTH 4X10 WHT NS (GAUZE/BANDAGES/DRESSINGS) ×4 IMPLANT
TAPE CLOTH SURG 4X10 WHT LF (GAUZE/BANDAGES/DRESSINGS) IMPLANT
TOWEL GREEN STERILE (TOWEL DISPOSABLE) ×4 IMPLANT
TOWEL GREEN STERILE FF (TOWEL DISPOSABLE) ×4 IMPLANT
TRAY FOLEY MTR SLVR 16FR STAT (SET/KITS/TRAYS/PACK) ×4 IMPLANT
WATER STERILE IRR 1000ML POUR (IV SOLUTION) ×4 IMPLANT

## 2024-06-18 NOTE — Progress Notes (Signed)
 Orthopedic Tech Progress Note Patient Details:  [name] 05-09-1946 [id number]  Ortho Devices Type of Ortho Device: Lumbar corsett Ortho Device/Splint Interventions: Ordered   Post Interventions Instructions Provided: Care of device, Adjustment of device LSO left at bedside with family. [name] 06/18/2024, 9:05 PM

## 2024-06-18 NOTE — Transfer of Care (Signed)
 Immediate Anesthesia Transfer of Care Note  Patient: [name]  Procedure(s) Performed: ANTERIOR LATERAL LUMBAR FUSION LUMBAR ONE-TWO, LUMBAR TWO-THREE (Spine Lumbar) APPLICATION OF O-ARM LUMBAR PERCUTANEOUS PEDICLE SCREW LUMBAR ONE-THREE (Spine Lumbar)  Patient Location: PACU  Anesthesia Type:General  Level of Consciousness: drowsy, patient cooperative, and responds to stimulation  Airway & Oxygen Therapy: Patient Spontanous Breathing and Patient connected to face mask oxygen  Post-op Assessment: Report given to RN, Post -op Vital signs reviewed and stable, Patient moving all extremities X 4, and Patient able to stick tongue midline  Post vital signs: Reviewed and stable  Last Vitals:  Vitals Value Taken Time  BP 141/59 06/18/24 19:05  Temp 98.0   Pulse 74 06/18/24 19:09  Resp 15 06/18/24 19:09  SpO2 96 % 06/18/24 19:09  Vitals shown include unfiled device data.  Last Pain:  Vitals:   06/18/24 1039  TempSrc:   PainSc: 6       Patients Stated Pain Goal: 0 (06/18/24 1039)  Complications: No notable events documented.

## 2024-06-18 NOTE — H&P (Signed)
 [name] is an 78 y.o. male.   Chief Complaint: Back and bilateral leg pain inability to stand straight HPI: [name] is a 78 year old individual whose had multiple lumbar spine surgeries for advanced degenerative changes in his low back after a work-related incident.  He has evidence of severe degenerative changes at L2-3 and L1 to above his previous L3 to sacrum fusion he had surgery initially by Dr. Gaither a number of years ago I did a revision of his pseudoarthrosis at L5-S1 which gave him several years of relief but now he is having increasing pain in his back and inability to stand straight and constantly pitching forward in order to maintain a level of comfort.  An MRI demonstrates high-grade stenosis at the levels of L1-2 and L2-3 with a degenerative kyphosis at these levels there is vacuum disc phenomenon.  He also has a slight leaning kyphosis.  The plan is to do a left-sided XLIF operation at L1-2 and L2-3 followed by posterior stabilization of L1 to his previous hardware from L3 to the sacrum.  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   Paroxysmal atrial flutter (HCC)     Past Surgical History:  Procedure Laterality Date   ABDOMINAL EXPOSURE N/A 01/06/2020   Procedure: ABDOMINAL EXPOSURE;  Surgeon: Oris Krystal FALCON, MD;  Location: [facility] OR;  Service: Vascular;  Laterality: N/A;   ANTERIOR LUMBAR FUSION N/A 01/06/2020   Procedure: Lumbar Five- Sacral One  Anterior lumbar interbody fusion with infuse;  Surgeon: Colon Shove, MD;  Location: [facility]  OR;  Service: Neurosurgery;  Laterality: N/A;  Lumbar Five- Sacral One  Anterior lumbar interbody fusion with infuse   CERVICAL FUSION  1996, 1997   2 surgery- skull to C7   COLON RESECTION  2007   COLONOSCOPY W/ POLYPECTOMY     CORONARY ATHERECTOMY N/A 07/01/2021   Procedure: CORONARY ATHERECTOMY;  Surgeon: Verlin Lonni BIRCH, MD;  Location: [facility];  Service: Cardiovascular;  Laterality: N/A;   CORONARY PRESSURE/FFR STUDY N/A 06/18/2021   Procedure: INTRAVASCULAR PRESSURE WIRE/FFR STUDY;  Surgeon: Verlin Lonni BIRCH, MD;  Location: [facility];  Service: Cardiovascular;  Laterality: N/A;   CORONARY STENT INTERVENTION N/A 07/01/2021   Procedure: CORONARY STENT INTERVENTION;  Surgeon: Verlin Lonni BIRCH, MD;  Location: [facility];  Service: Cardiovascular;  Laterality: N/A;   CORONARY ULTRASOUND/IVUS N/A 07/01/2021   Procedure: Intravascular Ultrasound/IVUS;  Surgeon: Verlin Lonni BIRCH, MD;  Location: [facility];  Service: Cardiovascular;  Laterality: N/A;   HEMORRHOID SURGERY N/A 01/13/2016   Procedure: HEMORRHOIDECTOMY AND REMOVAL OF ANAL SKIN TAG;  Surgeon: Vicenta Poli, MD;  Location: [facility];  Service: General;  Laterality: N/A;   REPLACEMENT TOTAL KNEE Left 2005   left   RIGHT/LEFT HEART CATH AND CORONARY ANGIOGRAPHY N/A 06/18/2021   Procedure: RIGHT/LEFT HEART CATH AND CORONARY ANGIOGRAPHY;  Surgeon: Verlin Lonni BIRCH, MD;  Location: [facility];  Service: Cardiovascular;  Laterality: N/A;  SYNOVIAL CYST EXCISION     lumbar spine   TONSILLECTOMY     TOTAL KNEE ARTHROPLASTY Right 01/06/2017   Procedure: RIGHT TOTAL KNEE ARTHROPLASTY;  Surgeon: Lamar Collet, MD;  Location: [facility];  Service: Orthopedics;  Laterality: Right;  Adductior Block    Family History  Problem Relation Age of Onset   Coronary artery disease Mother    Coronary artery disease Father    Colon cancer Neg Hx    Stomach cancer Neg Hx    Social  History:  reports that he has never smoked. He has never used smokeless tobacco. He reports that he does not drink alcohol and does not use drugs.  Allergies:  Allergies  Allergen Reactions   Alfuzosin Other (See Comments) and Swelling    Felt winded/faint   Duloxetine  Hcl Other (See Comments) and Swelling    Nausea, dizzy, sick  Other Reaction(s): Dizziness   Gabapentin Nausea Only and Nausea And Vomiting    Other Reaction(s): dizzy   Pregabalin Nausea Only, Other (See Comments), Nausea And Vomiting and Itching    Felt poorly  Other Reaction(s): dizzy   Tamsulosin Hcl Other (See Comments), Nausea Only and Swelling    Orthostatic hypotension   Contrast Media [Iodinated Contrast Media] Rash    Cath dye 06/18/21 rash started next day   Niacin Diarrhea    Medications Prior to Admission  Medication Sig Dispense Refill   amLODipine  (NORVASC ) 5 MG tablet TAKE 1 TABLET BY MOUTH TWICE DAILY 180 tablet 2   aspirin  EC 81 MG tablet Take 1 tablet (81 mg total) by mouth daily. Swallow whole. 90 tablet 3   benazepril  (LOTENSIN ) 20 MG tablet TAKE 1 TABLET BY MOUTH TWICE DAILY 180 tablet 2   Cholecalciferol (VITAMIN D3) 50 MCG (2000 UT) TABS Take 2,000 Units by mouth daily with lunch.     cyanocobalamin  (VITAMIN B12) 1000 MCG/ML injection Inject 1,000 mcg into the muscle every 30 (thirty) days.     fluocinonide (LIDEX) 0.05 % external solution Apply 1 Application topically 2 (two) times daily as needed (neck rash/irritation.).     hydrochlorothiazide  (,MICROZIDE /HYDRODIURIL ,) 12.5 MG capsule Take 12.5 mg by mouth daily with lunch.      Multiple Vitamin (MULTIVITAMIN WITH MINERALS) TABS tablet Take 1 tablet by mouth every evening. Adult 50+     oxyCODONE  (OXY IR/ROXICODONE ) 5 MG immediate release tablet Take 1 tablet (5 mg total) by mouth every 6 (six) hours as needed. 120 tablet 0   rosuvastatin  (CRESTOR ) 10 MG tablet TAKE 1 TABLET BY MOUTH DAILY (Patient taking differently: Take 10 mg by mouth at  bedtime.) 90 tablet 1   tadalafil  (CIALIS ) 5 MG tablet Take 1 tablet (5 mg total) by mouth daily. (Patient taking differently: Take 5 mg by mouth daily as needed for erectile dysfunction.) 30 tablet 11   celecoxib  (CELEBREX ) 200 MG capsule Take 1 capsule (200 mg total) by mouth daily as needed. 30 capsule 11   methocarbamol  (ROBAXIN ) 500 MG tablet Take 500 mg by mouth every 8 (eight) hours as needed for muscle spasms.     nitroGLYCERIN  (NITROSTAT ) 0.4 MG SL tablet Place 1 tablet (0.4 mg total) under the tongue every 5 (five) minutes as needed. 25 tablet 2    No results found for this or any previous visit (from the past 48 hours). No results found.  Review of Systems  Musculoskeletal:  Positive for back pain, gait problem and myalgias.  Neurological:  Positive for weakness and numbness.  All other  systems reviewed and are negative.   Blood pressure (!) 152/61, pulse 69, temperature 98.7 F (37.1 C), temperature source Oral, resp. rate 17, height 5' 11 (1.803 m), weight 110.2 kg, SpO2 95%. Physical Exam Constitutional:      Appearance: Normal appearance.  HENT:     Head: Normocephalic and atraumatic.     Right Ear: Tympanic membrane, ear canal and external ear normal.     Left Ear: Tympanic membrane, ear canal and external ear normal.     Nose: Nose normal.     Mouth/Throat:     Mouth: Mucous membranes are moist.     Pharynx: Oropharynx is clear.   Eyes:     Extraocular Movements: Extraocular movements intact.     Conjunctiva/sclera: Conjunctivae normal.     Pupils: Pupils are equal, round, and reactive to light.    Cardiovascular:     Rate and Rhythm: Normal rate and regular rhythm.     Heart sounds: Normal heart sounds.  Pulmonary:     Effort: Pulmonary effort is normal.     Breath sounds: Normal breath sounds.  Abdominal:     General: Abdomen is flat.     Palpations: Abdomen is soft.   Musculoskeletal:     Cervical back: Normal range of motion.     Comments:  Straight leg raising is positive at 30 degrees in either lower extremities Patrick's maneuver is positive bilaterally   Skin:    General: Skin is warm and dry.     Capillary Refill: Capillary refill takes less than 2 seconds.   Neurological:     General: No focal deficit present.     Mental Status: He is alert and oriented to person, place, and time. Mental status is at baseline.   Psychiatric:        Mood and Affect: Mood normal.        Behavior: Behavior normal.        Thought Content: Thought content normal.        Judgment: Judgment normal.      Assessment/Plan Advanced spondylosis and stenosis with kyphotic deformity L1-2 L2-3 history of fusion L3 to sacrum.  Radiculopathy.  Neurogenic claudication.  Plan: Anterolateral indirect decompression L1-2 and L2-3 with XLIF technique.  Posterior fixation L1-L3 with percutaneous pedicle screw fixation.  Victory JINNY Gens, MD 06/18/2024, 12:34 PM

## 2024-06-18 NOTE — Op Note (Signed)
 Date of surgery: 06/18/2024 Preoperative diagnosis: Lumbar stenosis L1-L2 3 neurogenic claudication, back pain Postoperative diagnosis: Same Procedure: Anterolateral decompression L1-2 and L2-3 with XLIF technique.  Posterior fixation L1-L3 with pedicle screws and percutaneously placed for fixation.  O-arm image guidance, EMG monitoring, fluoroscopy. Surgeon: Victory Gens Anesthesia: General Tracheal Indications: [name] is a 78 year old male who has had significant back and bilateral lower extremity pain he has had multiple surgeries which resulted in arthrodesis from L3 down to the sacrum.  He now has adjacent level disease at L1-2 and L2-3.  He is undergoing decompression and arthrodesis at these levels.  Procedure: The patient was brought to the operating room supine on the stretcher.  After the smooth induction of general endotracheal anesthesia, he was placed in the right lateral decubitus position and fluoroscopic guidance was used to check for orthogonal LFT.  Once he was positioned correctly the body was taped in the position on the table and the skin was marked over the left lateral aspect for entry into the L2-3 and L1-2 interspaces.  A lateral incision was then created and dissected down through the superficial fascia.  A blunt tipped probe was then passed into the retroperitoneal space and docked over the midportion of the lumbar vertebrae.  EMG stimulation was performed to make sure no elements of the lumbar plexus were being irritated or compressed.  Then a series of dilators was passed over this initial probe after a K wire was passed into the vertebral body.  Ultimately 120 mm deep retractor was placed over the outer dilators and secured to the operating table with a clamp.  The posterior aspect of the retractor was checked for any EMG activity and when none was noted Chane was placed into the posterior aspect of the disc space under fluoroscopic visualization.  This was at L2-3 first.   Then the lateral aspect of the disc was opened with a #15 blade and the combination of curettes and rongeurs was used to evacuate substantially degenerated and desiccated disc material.  Cobb elevators were used to open the contralateral ligament on the opposite side.  Then a series of dilators was passed into this area along with curettes and rongeurs to evacuate substantially degenerated disc material.  In the end we were able to pass an 18 mm wide spacer which ultimately we were increased to 22 mm in width and 10 mm in height with 10 degrees of lordosis.  This allowed for good distraction of the interspace opening of the foramen and good indirect decompression.  The graft was filled with allograft in the form of Osteocel.  Once the the first spacer was placed measuring 55 mm in diameter we remove the retractors obtain the final x-ray and then placed the same procedure in at the L1-L2 interspace.  Here there was substantial degenerated disc also and we were able to pace the same size spacer measuring 10 mm in height 22 mm in anterior posterior dimension and 55 mm in width.  This was also filled with Osteocel.  A good decompression was noted on the final radiographs of both these decompressions.  The wound was checked for hemostasis and then the deep fascial layer was closed with 3-0 Vicryl in the subcuticular layer was closed with 3-0 Vicryl also.  Dermabond was placed on the skin.  The patient was then turned to the prone position on a Jackson table and a reference pin was placed in the posterior suprailiac crest on the right side.  A spin  was obtained with the O-arm to identify the anatomy from L1-L3.  Once the spin was completed we verified the reference arm and then used image guidance to place pedicle screws in L1 and L2 these were 6.5 x 50 mm pedicle screws on the left side of L1 and L2 and 6.5 x 45 mm pedicle screw on the right side at L1 and 6.5 x 50 mm on the right side at L2.  Once the screws were placed  with their attached towers protruding through the skin we then used a Metrix tube to expose the pedicle screw at L3.  The rod at the screw from the previous fixation L3 to the sacrum was cut below the level of the screw using a high-speed drill with plenty of irrigation.  The screw Was then loosened and the cut rod was removed.  On the left side and 90 mm rod was used to connect the screws from L1 L3 and on the right side 100 mm rod was used past percutaneously.  The fixation was then tightened in a neutral construct.  Final radiographs were obtained identifying good decompression and restoration of height of the L1-2 and L2-3 disc spaces good alignment in the coronal and sagittal planes was noted also.  With this the wounds were irrigated copiously hemostasis was achieved using bipolar and monopolar cautery and then the fascia was closed with 3-0 Vicryl in interrupted fashion and the subcuticular skin was closed with both 3-0 and 4-0 Vicryl.  Dermabond was placed on the skin and the total blood loss for the procedure was 100 cc.

## 2024-06-19 ENCOUNTER — Encounter (HOSPITAL_COMMUNITY): Payer: Self-pay | Admitting: Neurological Surgery

## 2024-06-19 LAB — BASIC METABOLIC PANEL WITH GFR
Anion gap: 11 (ref 5–15)
BUN: 29 mg/dL — ABNORMAL HIGH (ref 8–23)
CO2: 21 mmol/L — ABNORMAL LOW (ref 22–32)
Calcium: 9.2 mg/dL (ref 8.9–10.3)
Chloride: 104 mmol/L (ref 98–111)
Creatinine, Ser: 1.35 mg/dL — ABNORMAL HIGH (ref 0.61–1.24)
GFR, Estimated: 53 mL/min — ABNORMAL LOW (ref >60)
Glucose, Bld: 192 mg/dL — ABNORMAL HIGH (ref 70–99)
Potassium: 4.7 mmol/L (ref 3.5–5.1)
Sodium: 136 mmol/L (ref 135–145)

## 2024-06-19 MED ORDER — DEXAMETHASONE 4 MG PO TABS
2.0000 mg | ORAL_TABLET | Freq: Two times a day (BID) | ORAL | Status: DC
  Administered 2024-06-19 – 2024-06-20 (×3): 2 mg via ORAL
  Filled 2024-06-19 (×3): qty 1

## 2024-06-19 MED ORDER — KETOROLAC TROMETHAMINE 15 MG/ML IJ SOLN
7.5000 mg | Freq: Four times a day (QID) | INTRAMUSCULAR | Status: AC
  Administered 2024-06-19 – 2024-06-20 (×5): 7.5 mg via INTRAVENOUS
  Filled 2024-06-19 (×5): qty 1

## 2024-06-19 NOTE — Evaluation (Signed)
 Physical Therapy Evaluation  Patient Details Name: [name] MRN: [id number] DOB: 1946-03-17 Today's Date: 06/19/2024  History of Present Illness  Pt is a 78 y/o male who presents s/p L1-L3 XLIF on 06/18/2024. PMH significant for C7 fusion, CAD, HTN, obesity, a-flutter, prior lumbar fusion.  Clinical Impression  Pt admitted with above diagnosis. At the time of PT eval, pt was able to demonstrate transfers and ambulation with gross CGA to min assist and RW for support. Pt was educated on precautions, brace application/wearing schedule, appropriate activity progression, and car transfer. Pt currently with functional limitations due to the deficits listed below (see PT Problem List). Pt will benefit from skilled PT to increase their independence and safety with mobility to allow discharge to the venue listed below.          If plan is discharge home, recommend the following: A little help with walking and/or transfers;A little help with bathing/dressing/bathroom;Assistance with cooking/housework;Assist for transportation;Help with stairs or ramp for entrance   Can travel by private vehicle        Equipment Recommendations None recommended by PT  Recommendations for Other Services       Functional Status Assessment Patient has had a recent decline in their functional status and demonstrates the ability to make significant improvements in function in a reasonable and predictable amount of time.     Precautions / Restrictions Precautions Precautions: Back Precaution Booklet Issued: Yes (comment) Recall of Precautions/Restrictions: Intact Precaution/Restrictions Comments: Reviewed handout and pt was cued for precautions during functional mobility. Required Braces or Orthoses: Spinal Brace Spinal Brace: Lumbar corset;Applied in sitting position Restrictions Weight Bearing Restrictions Per Provider Order: No      Mobility  Bed Mobility Overal bed mobility: Needs Assistance Bed  Mobility: Rolling, Sit to Sidelying Rolling: Supervision, Used rails Sidelying to sit: Supervision, Used rails, HOB elevated     Sit to sidelying: Min assist General bed mobility comments: Step-by-step cues for log roll technique. Assist for LE elevation back up into bed at end of session.    Transfers Overall transfer level: Needs assistance Equipment used: Rolling walker (2 wheels) Transfers: Sit to/from Stand Sit to Stand: Contact guard assist           General transfer comment: VC's for hand placement on seated surface for safety.    Ambulation/Gait Ambulation/Gait assistance: Contact guard assist Gait Distance (Feet): 200 Feet Assistive device: Rolling walker (2 wheels) Gait Pattern/deviations: Step-through pattern, Decreased stride length, Trunk flexed, Narrow base of support Gait velocity: Decreased Gait velocity interpretation: <1.31 ft/sec, indicative of household ambulator   General Gait Details: Gorssly flexed trunk with eyes closed. VC's throughout for improved posture, closer walker proximity and forward gaze with eyes open. Pt reports nausea.  Stairs            Wheelchair Mobility     Tilt Bed    Modified Rankin (Stroke Patients Only)       Balance Overall balance assessment: Needs assistance Sitting-balance support: No upper extremity supported, Feet supported Sitting balance-Leahy Scale: Fair     Standing balance support: Bilateral upper extremity supported, During functional activity Standing balance-Leahy Scale: Poor                               Pertinent Vitals/Pain Pain Assessment Pain Assessment: 0-10 Pain Score: 5  Pain Location: L groin into testicles Pain Descriptors / Indicators: Aching, Discomfort, Guarding Pain Intervention(s): Limited activity within patient's  tolerance, Monitored during session, Repositioned    Home Living Family/patient expects to be discharged to:: Private residence Living Arrangements:  Spouse/significant other Available Help at Discharge: Family;Available 24 hours/day Type of Home: House Home Access: Stairs to enter Entrance Stairs-Rails: Right Entrance Stairs-Number of Steps: 3   Home Layout: One level Home Equipment: Rolling Walker (2 wheels);Grab bars - tub/shower;Shower seat;Cane - single point      Prior Function Prior Level of Function : Independent/Modified Independent             Mobility Comments: no AD ADLs Comments: ind     Extremity/Trunk Assessment   Upper Extremity Assessment Upper Extremity Assessment: Defer to OT evaluation    Lower Extremity Assessment Lower Extremity Assessment: Generalized weakness;RLE deficits/detail;LLE deficits/detail RLE Sensation:  (Tingling with light touch) LLE Sensation:  (Tingling with light touch)    Cervical / Trunk Assessment Cervical / Trunk Assessment: Back Surgery Cervical / Trunk Exceptions: cervical fusion.  Communication   Communication Communication: No apparent difficulties    Cognition Arousal: Alert Behavior During Therapy: WFL for tasks assessed/performed                             Following commands: Intact       Cueing Cueing Techniques: Verbal cues     General Comments General comments (skin integrity, edema, etc.): Pt c/o dizziness when sitting EOB, Bp 128/54 suspect pain medications.    Exercises     Assessment/Plan    PT Assessment Patient needs continued PT services  PT Problem List Decreased strength;Decreased activity tolerance;Decreased balance;Decreased mobility;Decreased safety awareness;Decreased knowledge of use of DME;Pain       PT Treatment Interventions DME instruction;Gait training;Stair training;Functional mobility training;Therapeutic activities;Therapeutic exercise;Balance training;Patient/family education    PT Goals (Current goals can be found in the Care Plan section)  Acute Rehab PT Goals Patient Stated Goal: Feel better PT Goal  Formulation: With patient/family Time For Goal Achievement: 06/26/24 Potential to Achieve Goals: Good    Frequency Min 5X/week     Co-evaluation               AM-PAC PT 6 Clicks Mobility  Outcome Measure Help needed turning from your back to your side while in a flat bed without using bedrails?: A Little Help needed moving from lying on your back to sitting on the side of a flat bed without using bedrails?: A Little Help needed moving to and from a bed to a chair (including a wheelchair)?: A Little Help needed standing up from a chair using your arms (e.g., wheelchair or bedside chair)?: A Little Help needed to walk in hospital room?: A Little Help needed climbing 3-5 steps with a railing? : A Lot 6 Click Score: 17    End of Session Equipment Utilized During Treatment: Gait belt;Back brace Activity Tolerance: Patient tolerated treatment well Patient left: in bed;with call bell/phone within reach;with family/visitor present Nurse Communication: Mobility status PT Visit Diagnosis: Unsteadiness on feet (R26.81);Pain Pain - part of body:  (L groin into testicles)    Time: 9064-9052 PT Time Calculation (min) (ACUTE ONLY): 12 min   Charges:   PT Evaluation $PT Eval Low Complexity: 1 Low   PT General Charges $$ ACUTE PT VISIT: 1 Visit         Leita Sable, PT, DPT Acute Rehabilitation Services Secure Chat Preferred Office: [phone]   Leita JONETTA Sable 06/19/2024, 10:42 AM

## 2024-06-19 NOTE — Anesthesia Postprocedure Evaluation (Signed)
 Anesthesia Post Note  Patient: [name]  Procedure(s) Performed: ANTERIOR LATERAL LUMBAR FUSION LUMBAR ONE-TWO, LUMBAR TWO-THREE (Spine Lumbar) APPLICATION OF O-ARM LUMBAR PERCUTANEOUS PEDICLE SCREW LUMBAR ONE-THREE (Spine Lumbar)     Patient location during evaluation: PACU Anesthesia Type: General Level of consciousness: awake and alert Pain management: pain level controlled Vital Signs Assessment: post-procedure vital signs reviewed and stable Respiratory status: spontaneous breathing, nonlabored ventilation, respiratory function stable and patient connected to nasal cannula oxygen Cardiovascular status: blood pressure returned to baseline and stable Postop Assessment: no apparent nausea or vomiting Anesthetic complications: no   No notable events documented.  Last Vitals:  Vitals:   06/18/24 2036 06/18/24 2231  BP: (!) 129/56 139/65  Pulse: 67 74  Resp: 20 18  Temp: 36.4 C 36.9 C  SpO2: 90% 94%    Last Pain:  Vitals:   06/19/24 0225  TempSrc:   PainSc: 4                  [name] BRAVO

## 2024-06-19 NOTE — Evaluation (Signed)
 Occupational Therapy Evaluation Patient Details Name: [name] MRN: [id number] DOB: 03-12-1946 Today's Date: 06/19/2024   History of Present Illness   Pt is a 78 yo male admitted to [facility] on 06/18/24 with c.o back and BLE pain from dx of lumbar stenosis with neurogenic claudication. S/p Antereo-lateral lumbar fusion L1-L3 on 7/1. PMH of  C7 fusion, CAD, Dylsipidemia, HTN, Obesity, a flutter, lumbar fusion (2021).     Clinical Impressions Pt admitted for above, PTA pt reports being ind/mod I with ADLs and ambulating no AD, he has 24/7 assist from spouse if needed. Pt currently c/o groin pain and tingling along BLEs with touch, at time of eval he was very dizzy but VSS. Pt demonstrated good understanding of post op back precautions, currently needing mod A to setup A for ADLs as he reports increased challenge lifting his legs from the pain. He was able to ambulate to hallway with CGA + RW support, became a bit more nauseous when pt was handed off to PT for gait training. OT to follow-up with pt to progress them closer to baseline. Patient would benefit from post acute Home OT services to help maximize functional independence in natural environment      If plan is discharge home, recommend the following:   A lot of help with bathing/dressing/bathroom;Assistance with cooking/housework;Assist for transportation     Functional Status Assessment   Patient has had a recent decline in their functional status and demonstrates the ability to make significant improvements in function in a reasonable and predictable amount of time.     Equipment Recommendations   None recommended by OT (pt has rec DME)     Recommendations for Other Services         Precautions/Restrictions   Precautions Precautions: Back Precaution Booklet Issued: Yes (comment) Recall of Precautions/Restrictions: Intact Required Braces or Orthoses: Spinal Brace Spinal Brace: Lumbar corset;Applied in sitting  position Restrictions Weight Bearing Restrictions Per Provider Order: No     Mobility Bed Mobility Overal bed mobility: Needs Assistance Bed Mobility: Rolling, Sidelying to Sit Rolling: Supervision, Used rails Sidelying to sit: Supervision, Used rails, HOB elevated       General bed mobility comments: Pt reliant on bed rail for bed mobility, reports he has poster bed at home that he uses to assist with OOB too. Good recall of log roll.    Transfers Overall transfer level: Needs assistance Equipment used: Rolling walker (2 wheels) Transfers: Sit to/from Stand Sit to Stand: Contact guard assist           General transfer comment: Cues needed for hand placement/push off      Balance Overall balance assessment: Needs assistance Sitting-balance support: No upper extremity supported, Feet supported Sitting balance-Leahy Scale: Fair     Standing balance support: Bilateral upper extremity supported, During functional activity Standing balance-Leahy Scale: Poor                             ADL either performed or assessed with clinical judgement   ADL Overall ADL's : Needs assistance/impaired Eating/Feeding: Set up;Sitting   Grooming: Standing;Contact guard assist   Upper Body Bathing: Sitting;Contact guard assist   Lower Body Bathing: Sitting/lateral leans;Moderate assistance   Upper Body Dressing : Sitting;Contact guard assist   Lower Body Dressing: Sit to/from stand;Moderate assistance;Sitting/lateral leans   Toilet Transfer: Contact guard assist;Rolling walker (2 wheels);Ambulation     Toileting - Clothing Manipulation Details (indicate cue type and  reason): NT     Functional mobility during ADLs: Contact guard assist;Rolling walker (2 wheels)       Vision   Vision Assessment?: No apparent visual deficits Additional Comments: Limited neck ROM given his Cervical fusion.     Perception         Praxis         Pertinent Vitals/Pain Pain  Assessment Pain Assessment: Faces Faces Pain Scale: Hurts even more Pain Location: Groin Pain Descriptors / Indicators: Aching, Discomfort, Guarding Pain Intervention(s): Limited activity within patient's tolerance, Monitored during session, Other (comment) (had oxy at 8am)     Extremity/Trunk Assessment Upper Extremity Assessment Upper Extremity Assessment: Overall WFL for tasks assessed   Lower Extremity Assessment Lower Extremity Assessment: Defer to PT evaluation (pt notes tingling in bilat LEs with light touch, beginning around hips then distally.)   Cervical / Trunk Assessment Cervical / Trunk Assessment: Back Surgery;Other exceptions Cervical / Trunk Exceptions: cervical fusion.   Communication Communication Communication: No apparent difficulties   Cognition Arousal: Alert Behavior During Therapy: WFL for tasks assessed/performed Cognition: No apparent impairments                               Following commands: Intact       Cueing  General Comments   Cueing Techniques: Verbal cues  Pt c/o dizziness when sitting EOB, Bp 128/54 suspect pain medications.   Exercises     Shoulder Instructions      Home Living Family/patient expects to be discharged to:: Private residence Living Arrangements: Spouse/significant other Available Help at Discharge: Family;Available 24 hours/day Type of Home: House Home Access: Stairs to enter [employer] of Steps: 3 Entrance Stairs-Rails: Right Home Layout: One level     Bathroom Shower/Tub: Chief Strategy Officer: Handicapped height (+ counter)     Home Equipment: Agricultural consultant (2 wheels);Grab bars - tub/shower;Shower seat;Cane - single point          Prior Functioning/Environment Prior Level of Function : Independent/Modified Independent             Mobility Comments: no AD ADLs Comments: ind    OT Problem List: Pain;Impaired balance (sitting and/or standing);Decreased  activity tolerance   OT Treatment/Interventions: Therapeutic exercise;Self-care/ADL training;Balance training;Therapeutic activities;DME and/or AE instruction      OT Goals(Current goals can be found in the care plan section)   Acute Rehab OT Goals Patient Stated Goal: To go home OT Goal Formulation: With patient Time For Goal Achievement: 07/03/24 Potential to Achieve Goals: Good ADL Goals Pt Will Perform Grooming: with supervision;with set-up;standing Pt Will Perform Lower Body Bathing: with modified independence;sitting/lateral leans Pt Will Perform Lower Body Dressing: with modified independence;sit to/from stand Pt Will Transfer to Toilet: with supervision;ambulating   OT Frequency:  Min 2X/week    Co-evaluation              AM-PAC OT 6 Clicks Daily Activity     Outcome Measure Help from another person eating meals?: None Help from another person taking care of personal grooming?: A Little Help from another person toileting, which includes using toliet, bedpan, or urinal?: A Lot Help from another person bathing (including washing, rinsing, drying)?: A Lot Help from another person to put on and taking off regular upper body clothing?: A Little Help from another person to put on and taking off regular lower body clothing?: A Lot 6 Click Score: 16   End of  Session Equipment Utilized During Treatment: Rolling walker (2 wheels);Back brace Nurse Communication: Mobility status  Activity Tolerance: Patient tolerated treatment well Patient left: Other (comment) (Pt handed off to providing PT working on hall ambulation)  OT Visit Diagnosis: Unsteadiness on feet (R26.81);Other abnormalities of gait and mobility (R26.89);Pain Pain - Right/Left:  (bilat) Pain - part of body:  (groin)                Time: 9089-9067 OT Time Calculation (min): 22 min Charges:  OT General Charges $OT Visit: 1 Visit OT Evaluation $OT Eval Low Complexity: 1 Low  06/19/2024  AB,  OTR/L  Acute Rehabilitation Services  Office: [phone]   Curtistine JONETTA Das 06/19/2024, 10:14 AM

## 2024-06-19 NOTE — Progress Notes (Signed)
 Patient ID: [name], male   DOB: Sep 10, 1946, 78 y.o.   MRN: [id number] Signs are stable Bleedthrough noticed in posterior incision on the right Dressing is being changed currently clean and dry Function appears stable with considerable spastic myelopathy in the lower extremities.  He is ambulatory.  Will allow him to ambulate today continue to mobilize as tolerated.  Plan discharge tomorrow

## 2024-06-20 DIAGNOSIS — I483 Typical atrial flutter: Secondary | ICD-10-CM

## 2024-06-20 DIAGNOSIS — I2510 Atherosclerotic heart disease of native coronary artery without angina pectoris: Secondary | ICD-10-CM

## 2024-06-20 MED ORDER — DEXAMETHASONE 1 MG PO TABS: ORAL_TABLET | ORAL | 0 refills | Status: AC

## 2024-06-20 MED ORDER — PREGABALIN 75 MG PO CAPS: 75.0000 mg | ORAL_CAPSULE | Freq: Two times a day (BID) | ORAL | 2 refills | Status: DC

## 2024-06-20 NOTE — Discharge Summary (Signed)
 Physician Discharge Summary  Patient ID: [name] MRN: [id number] DOB/AGE: 1946/09/12 78 y.o.  Admit date: 06/18/2024 Discharge date: 06/20/2024  Admission Diagnoses: Lumbar stenosis with neurogenic claudication and lumbar radiculopathy L1-2 and L2-3  Discharge Diagnoses: Lumbar stenosis.  Neurogenic claudication.  Lumbar radiculopathy L1-L2 3.  History of fusion L3 to sacrum. Principal Problem:   Lumbar stenosis with neurogenic claudication   Discharged Condition: good  Hospital Course: Patient tolerated surgery well.  Consults: None  Significant Diagnostic Studies: None  Treatments: surgery: See op note  Discharge Exam: Blood pressure (!) 120/50, pulse (!) 57, temperature 97.6 F (36.4 C), temperature source Oral, resp. rate 18, height 5' 11 (1.803 m), weight 110.2 kg, SpO2 93%. Incision is clean and dry motor function is intact  Disposition: Discharge disposition: 01-Home or Self Care       Discharge Instructions     Call MD for:  redness, tenderness, or signs of infection (pain, swelling, redness, odor or green/yellow discharge around incision site)   Complete by: As directed    Call MD for:  severe uncontrolled pain   Complete by: As directed    Call MD for:  temperature >100.4   Complete by: As directed    Diet - low sodium heart healthy   Complete by: As directed    Discharge wound care:   Complete by: As directed    Okay to shower. Do not apply salves or appointments to incision. No heavy lifting with the upper extremities greater than 10 pounds. May resume driving when not requiring pain medication and patient feels comfortable with doing so.   Incentive spirometry RT   Complete by: As directed    Increase activity slowly   Complete by: As directed       Allergies as of 06/20/2024       Reactions   Alfuzosin Other (See Comments), Swelling   Felt winded/faint   Duloxetine  Hcl Other (See Comments), Swelling   Nausea, dizzy, sick Other  Reaction(s): Dizziness   Gabapentin Nausea Only, Nausea And Vomiting   Other Reaction(s): dizzy   Pregabalin Nausea Only, Other (See Comments), Nausea And Vomiting, Itching   Felt poorly Other Reaction(s): dizzy   Tamsulosin Hcl Other (See Comments), Nausea Only, Swelling   Orthostatic hypotension   Contrast Media [iodinated Contrast Media] Rash   Cath dye 06/18/21 rash started next day   Niacin Diarrhea        Medication List     TAKE these medications    amLODipine  5 MG tablet Commonly known as: NORVASC  TAKE 1 TABLET BY MOUTH TWICE DAILY   aspirin  EC 81 MG tablet Take 1 tablet (81 mg total) by mouth daily. Swallow whole.   benazepril  20 MG tablet Commonly known as: LOTENSIN  TAKE 1 TABLET BY MOUTH TWICE DAILY   celecoxib  200 MG capsule Commonly known as: CeleBREX  Take 1 capsule (200 mg total) by mouth daily as needed.   cyanocobalamin  1000 MCG/ML injection Commonly known as: VITAMIN B12 Inject 1,000 mcg into the muscle every 30 (thirty) days.   dexamethasone  1 MG tablet Commonly known as: DECADRON  2 tablets twice daily for 2 days, one tablet twice daily for 2 days, one tablet daily for 2 days.   fluocinonide 0.05 % external solution Commonly known as: LIDEX Apply 1 Application topically 2 (two) times daily as needed (neck rash/irritation.).   hydrochlorothiazide  12.5 MG capsule Commonly known as: MICROZIDE  Take 12.5 mg by mouth daily with lunch.   methocarbamol  500 MG tablet Commonly known  as: ROBAXIN  Take 500 mg by mouth every 8 (eight) hours as needed for muscle spasms.   multivitamin with minerals Tabs tablet Take 1 tablet by mouth every evening. Adult 50+   nitroGLYCERIN  0.4 MG SL tablet Commonly known as: Nitrostat  Place 1 tablet (0.4 mg total) under the tongue every 5 (five) minutes as needed.   oxyCODONE  5 MG immediate release tablet Commonly known as: Oxy IR/ROXICODONE  Take 1 tablet (5 mg total) by mouth every 6 (six) hours as needed.    pregabalin 75 MG capsule Commonly known as: Lyrica Take 1 capsule (75 mg total) by mouth 2 (two) times daily.   rosuvastatin  10 MG tablet Commonly known as: CRESTOR  TAKE 1 TABLET BY MOUTH DAILY What changed: when to take this   tadalafil  5 MG tablet Commonly known as: CIALIS  Take 1 tablet (5 mg total) by mouth daily. What changed:  when to take this reasons to take this   Vitamin D3 50 MCG (2000 UT) Tabs Take 2,000 Units by mouth daily with lunch.               Discharge Care Instructions  (From admission, onward)           Start     Ordered   06/20/24 0000  Discharge wound care:       Comments: Okay to shower. Do not apply salves or appointments to incision. No heavy lifting with the upper extremities greater than 10 pounds. May resume driving when not requiring pain medication and patient feels comfortable with doing so.   06/20/24 1240             Signed: Victory PARAS Marlyss Cissell 06/20/2024, 12:40 PM

## 2024-06-20 NOTE — Consult Note (Signed)
 CARDIOLOGY CONSULT NOTE       Patient ID: [name] MRN: [id number] DOB/AGE: 1946/08/14 77 y.o.  Admit date: 06/18/2024 Referring Physician: Colon Primary Physician: Ransom Other, MD Primary Cardiologist: Okey Reason for Consultation: CAD/Flutter  Principal Problem:   Lumbar stenosis with neurogenic claudication   HPI:  78 y.o. now 2 days post repeat lumbar surgery. He was cleared for surgery by our PA 06/13/24. ASA held. He has a history of CAD with DES to LAD 07/01/21. He has not had any recurrent angina or issues with his surgeries. He is only maintained on ASA which has been resumed post op. He is active with PT and working out at [employer] with no angina. He has a history of distant atrial flutter > 5 years ago and is not on anticoagulation. He is doing well postoperatively with binder on. He is not on telemetry He is back on statin and ASA for CAD He is on He is on lotensin , norvasc  and hydrochlorothiazide  for his HTN Post op no angina or arrhythmia Has been up with PT BP is normal pain control is good   ROS All other systems reviewed and negative except as noted above  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   Paroxysmal atrial flutter (HCC)     Family History  Problem Relation Age of Onset   Coronary artery disease Mother    Coronary artery disease Father    Colon cancer Neg Hx    Stomach cancer Neg Hx     Social History   Socioeconomic History   Marital status: Married    Spouse name: Shawnee   Number of children: 4    Years of education: 12   Highest education level: Not on file  Occupational History   Occupation: Retired    Associate Professor: RETIRED  Tobacco Use   Smoking status: Never   Smokeless tobacco: Never  Vaping Use   Vaping status: Never Used  Substance and Sexual Activity   Alcohol use: No   Drug use: No   Sexual activity: Not on file  Other Topics Concern   Not on file  Social History Narrative   Patient lives at home with Wife Shawnee.    Patient has 4 children.    Patient is currently not working. He is retired and disabled.    Patient has a high school education.    Social Drivers of Corporate investment banker Strain: Not on file  Food Insecurity: No Food Insecurity (06/19/2024)   Hunger Vital Sign    Worried About Running Out of Food in the Last Year: Never true    Ran Out of Food in the Last Year: Never true  Transportation Needs: No Transportation Needs (06/19/2024)   PRAPARE - Administrator, Civil Service (Medical): No    Lack of Transportation (Non-Medical): No  Physical Activity: Not on file  Stress: Not on file  Social Connections: Unknown (06/19/2024)   Social Connection and  Isolation Panel    Frequency of Communication with Friends and Family: More than three times a week    Frequency of Social Gatherings with Friends and Family: More than three times a week    Attends Religious Services: Not on file    Active Member of Clubs or Organizations: Yes    Attends Banker Meetings: More than 4 times per year    Marital Status: Married  Catering manager Violence: Not At Risk (06/19/2024)   Humiliation, Afraid, Rape, and Kick questionnaire    Fear of Current or Ex-Partner: No    Emotionally Abused: No    Physically Abused: No    Sexually Abused: No    Past Surgical History:  Procedure Laterality Date   ABDOMINAL EXPOSURE N/A 01/06/2020   Procedure: ABDOMINAL EXPOSURE;  Surgeon: Oris Krystal FALCON, MD;  Location: [facility] OR;  Service: Vascular;  Laterality: N/A;    ANTERIOR LAT LUMBAR FUSION N/A 06/18/2024   Procedure: ANTERIOR LATERAL LUMBAR FUSION LUMBAR ONE-TWO, LUMBAR TWO-THREE;  Surgeon: Colon Shove, MD;  Location: [facility] OR;  Service: Neurosurgery;  Laterality: N/A;   ANTERIOR LUMBAR FUSION N/A 01/06/2020   Procedure: Lumbar Five- Sacral One  Anterior lumbar interbody fusion with infuse;  Surgeon: Colon Shove, MD;  [facility] OR;  Service: Neurosurgery;  Laterality: N/A;  Lumbar Five- Sacral One  Anterior lumbar interbody fusion with infuse   CERVICAL FUSION  1996, 1997   2 surgery- skull to C7   COLON RESECTION  2007   COLONOSCOPY W/ POLYPECTOMY     CORONARY ATHERECTOMY N/A 07/01/2021   Procedure: CORONARY ATHERECTOMY;  Surgeon: Verlin Lonni BIRCH, MD;  Location: [facility] INVASIVE CV LAB;  Service: Cardiovascular;  Laterality: N/A;   CORONARY PRESSURE/FFR STUDY N/A 06/18/2021   Procedure: INTRAVASCULAR PRESSURE WIRE/FFR STUDY;  Surgeon: Verlin Lonni BIRCH, MD;  Location: [facility] INVAS[facility];  Service: Cardiovascular;  Laterality: N/A;   CORONARY STENT INTERVENTION N/A 07/01/2021   Procedure: CORONARY STENT INTERVENTION;  Surgeon: Verlin Lonni BIRCH, MD;  Location: [facility] INV[facility];  Service: Cardiovascular;  Laterality: N/A;   CORONARY ULTRASOUND/IVUS N/A 07/01/2021   Procedure: Intravascular Ultrasound/IVUS;  Surgeon: Verlin Lonni BIRCH, MD;  Location: [facility];  Service: Cardiovascular;  Laterality: N/A;   HEMORRHOID SURGERY N/A 01/13/2016   Procedure: HEMORRHOIDECTOMY AND REMOVAL OF ANAL SKIN TAG;  Surgeon: Vicenta Poli, MD;  Location: [facility] OR;  Service: General;  Laterality: N/A;   LUMBAR PERCUTANEOUS PEDICLE SCREW 2 LEVEL N/A 06/18/2024   Procedure: LUMBAR PERCUTANEOUS PEDICLE SCREW LUMBAR ONE-THREE;  Surgeon: Colon Shove, MD;  Location: [facility] OR;  Service: Neurosurgery;  Laterality: N/A;   REPLACEMENT TOTAL KNEE Left 2005   left   RIGHT/LEFT HEART CATH AND CORONARY ANGIOGRAPHY N/A 06/18/2021   Procedure: RIGHT/LEFT HEART CATH  AND CORONARY ANGIOGRAPHY;  Surgeon: Verlin Lonni BIRCH, MD;  Location: [facility] INV[facility] LAB;  Service: Cardiovascular;  Laterality: N/A;   SYNOVIAL CYST EXCISION     lumbar spine   TONSILLECTOMY     TOTAL KNEE ARTHROPLASTY Right 01/06/2017   Procedure: RIGHT TOTAL KNEE ARTHROPLASTY;  Surgeon: Lamar Collet, MD;  Location: [facility] ORS;  Service: Orthopedics;  Laterality: Right;  Adductior Block      Current Facility-Administered Medications:    acetaminophen  (TYLENOL ) tablet 650 mg, 650 mg, Oral, Q4H PRN **OR** acetaminophen  (TYLENOL ) suppository 650 mg, 650 mg, Rectal, Q4H PRN, Colon Shove, MD   alum & mag hydroxide-simeth (MAALOX/MYLANTA) 200-200-20 MG/5ML suspension 30 mL, 30 mL, Oral, Q6H PRN, Colon Shove,  MD   amLODipine  (NORVASC ) tablet 5 mg, 5 mg, Oral, BID, Colon Shove, MD, 5 mg at 06/20/24 9187   aspirin  EC tablet 81 mg, 81 mg, Oral, Daily, Colon Shove, MD, 81 mg at 06/20/24 9187   benazepril  (LOTENSIN ) tablet 20 mg, 20 mg, Oral, BID, Colon Shove, MD, 20 mg at 06/20/24 9187   bisacodyl  (DULCOLAX) suppository 10 mg, 10 mg, Rectal, Daily PRN, Colon Shove, MD   celecoxib  (CELEBREX ) capsule 200 mg, 200 mg, Oral, Daily PRN, Colon Shove, MD   dexamethasone  (DECADRON ) tablet 2 mg, 2 mg, Oral, Q12H, Colon Shove, MD, 2 mg at 06/20/24 9187   docusate sodium  (COLACE) capsule 100 mg, 100 mg, Oral, BID, Colon Shove, MD, 100 mg at 06/20/24 9187   hydrochlorothiazide  (HYDRODIURIL ) tablet 12.5 mg, 12.5 mg, Oral, Q lunch, Colon Shove, MD, 12.5 mg at 06/19/24 1200   HYDROmorphone  (DILAUDID ) injection 1 mg, 1 mg, Intravenous, Q2H PRN, Colon Shove, MD, 1 mg at 06/19/24 0125   ketorolac  (TORADOL ) 15 MG/ML injection 7.5 mg, 7.5 mg, Intravenous, Q6H, Colon Shove, MD, 7.5 mg at 06/20/24 9381   menthol -cetylpyridinium (CEPACOL) lozenge 3 mg, 1 lozenge, Oral, PRN **OR** phenol (CHLORASEPTIC) mouth spray 1 spray, 1 spray, Mouth/Throat, PRN, Colon Shove, MD   methocarbamol  (ROBAXIN ) tablet  500 mg, 500 mg, Oral, Q6H PRN, 500 mg at 06/20/24 0812 **OR** methocarbamol  (ROBAXIN ) injection 500 mg, 500 mg, Intravenous, Q6H PRN, Colon Shove, MD   nitroGLYCERIN  (NITROSTAT ) SL tablet 0.4 mg, 0.4 mg, Sublingual, Q5 min PRN, Colon Shove, MD   ondansetron  (ZOFRAN ) tablet 4 mg, 4 mg, Oral, Q6H PRN, 4 mg at 06/19/24 0614 **OR** ondansetron  (ZOFRAN ) injection 4 mg, 4 mg, Intravenous, Q6H PRN, Colon Shove, MD   oxyCODONE  (Oxy IR/ROXICODONE ) immediate release tablet 5-10 mg, 5-10 mg, Oral, Q3H PRN, Colon Shove, MD, 10 mg at 06/20/24 9187   polyethylene glycol (MIRALAX  / GLYCOLAX ) packet 17 g, 17 g, Oral, Daily PRN, Colon Shove, MD   rosuvastatin  (CRESTOR ) tablet 10 mg, 10 mg, Oral, Daily, Colon Shove, MD, 10 mg at 06/20/24 9187   senna (SENOKOT) tablet 8.6 mg, 1 tablet, Oral, BID, Colon Shove, MD, 8.6 mg at 06/20/24 9187   sodium chloride  flush (NS) 0.9 % injection 3 mL, 3 mL, Intravenous, Q12H, Colon Shove, MD, 3 mL at 06/20/24 0815   sodium chloride  flush (NS) 0.9 % injection 3 mL, 3 mL, Intravenous, PRN, Colon Shove, MD   sodium phosphate  (FLEET) enema 1 enema, 1 enema, Rectal, Once PRN, Colon Shove, MD  amLODipine   5 mg Oral BID   aspirin  EC  81 mg Oral Daily   benazepril   20 mg Oral BID   dexamethasone   2 mg Oral Q12H   docusate sodium   100 mg Oral BID   hydrochlorothiazide   12.5 mg Oral Q lunch   ketorolac   7.5 mg Intravenous Q6H   rosuvastatin   10 mg Oral Daily   senna  1 tablet Oral BID   sodium chloride  flush  3 mL Intravenous Q12H     Physical Exam: Blood pressure 129/62, pulse 69, temperature 98.1 F (36.7 C), temperature source Oral, resp. rate 16, height 5' 11 (1.803 m), weight 110.2 kg, SpO2 90%.    Affect appropriate Healthy:  appears stated age HEENT: normal Neck supple with no adenopathy JVP normal no bruits no thyromegaly Lungs clear with no wheezing and good diaphragmatic motion Heart:  S1/S2 no murmur, no rub, gallop or click PMI  normal Abdomen: benighn, BS positve, no tenderness, no AAA no bruit.  No HSM or HJR Distal pulses intact with no bruits No edema Neuro non-focal Skin warm and dry Multiple prior cervical and lumbar surgeries  Binder in place    Labs:   Lab Results  Component Value Date   WBC 7.2 06/11/2024   HGB 12.9 (L) 06/11/2024   HCT 39.0 06/11/2024   MCV 99.0 06/11/2024   PLT 177 06/11/2024    Recent Labs  Lab 06/19/24 0951  NA 136  K 4.7  CL 104  CO2 21*  BUN 29*  CREATININE 1.35*  CALCIUM  9.2  GLUCOSE 192*   Lab Results  Component Value Date   TROPONINI <0.30 07/28/2014    Lab Results  Component Value Date   CHOL 99 (L) 10/04/2021   CHOL 152 08/10/2017   CHOL 158 09/05/2016   Lab Results  Component Value Date   HDL 30 (L) 10/04/2021   HDL 31 (L) 08/10/2017   HDL 36 (L) 09/05/2016   Lab Results  Component Value Date   LDLCALC 49 10/04/2021   LDLCALC 105 (H) 08/10/2017   LDLCALC 100 09/05/2016   Lab Results  Component Value Date   TRIG 106 10/04/2021   TRIG 80 08/10/2017   TRIG 110 09/05/2016   Lab Results  Component Value Date   CHOLHDL 3.3 10/04/2021   CHOLHDL 4.9 08/10/2017   CHOLHDL 4.4 09/05/2016   No results found for: LDLDIRECT    Radiology: DG Lumbar Spine 2-3 Views Result Date: 06/18/2024 CLINICAL DATA:  461500 Elective surgery 461500 EXAM: LUMBAR SPINE - 2-3 VIEW COMPARISON:  CT 05/02/2024 FINDINGS: Four fluoroscopic spot views of the lumbar spine submitted from the operating room. Multilevel posterior fusion hardware with interbody spacers, levels difficult to delineate due to coned view. There has been previous lower lumbar fusion. Fluoroscopy time 1 minutes 22 seconds. Dose 72.37 mGy. IMPRESSION: Intraoperative fluoroscopy during lumbar surgery. Electronically Signed   By: Andrea Gasman M.D.   On: 06/18/2024 20:16   DG C-Arm 1-60 Min-No Report Result Date: 06/18/2024 Fluoroscopy was utilized by the requesting physician.  No radiographic  interpretation.   DG C-Arm 1-60 Min-No Report Result Date: 06/18/2024 Fluoroscopy was utilized by the requesting physician.  No radiographic interpretation.   DG C-Arm 1-60 Min-No Report Result Date: 06/18/2024 Fluoroscopy was utilized by the requesting physician.  No radiographic interpretation.   DG C-Arm 1-60 Min-No Report Result Date: 06/18/2024 Fluoroscopy was utilized by the requesting physician.  No radiographic interpretation.   DG C-Arm 1-60 Min-No Report Result Date: 06/18/2024 Fluoroscopy was utilized by the requesting physician.  No radiographic interpretation.   DG O-ARM IMAGE ONLY/NO REPORT Result Date: 06/18/2024 There is no Radiologist interpretation  for this exam.   EKG: NSR no flutter   ASSESSMENT AND PLAN:   PAF:  distant flutter non recurrent no anticoagulation. On exam having infrequent PACls but in NSR  HTN:  normal continue home meds noted above CAD: DES to LAD 2022 Active at [facility] no angina ASA 81 mg resumed   D/c latter today ? Will arrange f/u with Dr Okey  Signed: Maude Emmer 06/20/2024, 11:08 AM

## 2024-06-20 NOTE — Plan of Care (Signed)
 Problem: Education: Goal: Knowledge of General Education information will improve Description: Including pain rating scale, medication(s)/side effects and non-pharmacologic comfort measures 06/20/2024 0528 by Marvis Kenneth SAILOR, RN Outcome: Progressing 06/19/2024 2059 by Marvis Kenneth SAILOR, RN Outcome: Progressing   Problem: Health Behavior/Discharge Planning: Goal: Ability to manage health-related needs will improve 06/20/2024 0528 by Marvis Kenneth SAILOR, RN Outcome: Progressing 06/19/2024 2059 by Marvis Kenneth SAILOR, RN Outcome: Progressing   Problem: Clinical Measurements: Goal: Ability to maintain clinical measurements within normal limits will improve 06/20/2024 0528 by Marvis Kenneth SAILOR, RN Outcome: Progressing 06/19/2024 2059 by Marvis Kenneth SAILOR, RN Outcome: Progressing Goal: Will remain free from infection 06/20/2024 0528 by Marvis Kenneth SAILOR, RN Outcome: Progressing 06/19/2024 2059 by Marvis Kenneth SAILOR, RN Outcome: Progressing Goal: Diagnostic test results will improve 06/20/2024 0528 by Marvis Kenneth SAILOR, RN Outcome: Progressing 06/19/2024 2059 by Marvis Kenneth SAILOR, RN Outcome: Progressing Goal: Respiratory complications will improve 06/20/2024 0528 by Marvis Kenneth SAILOR, RN Outcome: Progressing 06/19/2024 2059 by Marvis Kenneth SAILOR, RN Outcome: Progressing Goal: Cardiovascular complication will be avoided 06/20/2024 0528 by Marvis Kenneth SAILOR, RN Outcome: Progressing 06/19/2024 2059 by Marvis Kenneth SAILOR, RN Outcome: Progressing   Problem: Activity: Goal: Risk for activity intolerance will decrease 06/20/2024 0528 by Marvis Kenneth SAILOR, RN Outcome: Progressing 06/19/2024 2059 by Marvis Kenneth SAILOR, RN Outcome: Progressing   Problem: Nutrition: Goal: Adequate nutrition will be maintained 06/20/2024 0528 by Marvis Kenneth SAILOR, RN Outcome: Progressing 06/19/2024 2059 by Marvis Kenneth SAILOR, RN Outcome: Progressing   Problem: Coping: Goal: Level of anxiety will decrease 06/20/2024  0528 by Marvis Kenneth SAILOR, RN Outcome: Progressing 06/19/2024 2059 by Marvis Kenneth SAILOR, RN Outcome: Progressing   Problem: Elimination: Goal: Will not experience complications related to bowel motility 06/20/2024 0528 by Marvis Kenneth SAILOR, RN Outcome: Progressing 06/19/2024 2059 by Marvis Kenneth SAILOR, RN Outcome: Progressing Goal: Will not experience complications related to urinary retention 06/20/2024 0528 by Marvis Kenneth SAILOR, RN Outcome: Progressing 06/19/2024 2059 by Marvis Kenneth SAILOR, RN Outcome: Progressing   Problem: Pain Managment: Goal: General experience of comfort will improve and/or be controlled 06/20/2024 0528 by Marvis Kenneth SAILOR, RN Outcome: Progressing 06/19/2024 2059 by Marvis Kenneth SAILOR, RN Outcome: Progressing   Problem: Safety: Goal: Ability to remain free from injury will improve 06/20/2024 0528 by Marvis Kenneth SAILOR, RN Outcome: Progressing 06/19/2024 2059 by Marvis Kenneth SAILOR, RN Outcome: Progressing   Problem: Skin Integrity: Goal: Risk for impaired skin integrity will decrease 06/20/2024 0528 by Marvis Kenneth SAILOR, RN Outcome: Progressing 06/19/2024 2059 by Marvis Kenneth SAILOR, RN Outcome: Progressing   Problem: Education: Goal: Ability to verbalize activity precautions or restrictions will improve 06/20/2024 0528 by Marvis Kenneth SAILOR, RN Outcome: Progressing 06/19/2024 2059 by Marvis Kenneth SAILOR, RN Outcome: Progressing Goal: Knowledge of the prescribed therapeutic regimen will improve 06/20/2024 0528 by Marvis Kenneth SAILOR, RN Outcome: Progressing 06/19/2024 2059 by Marvis Kenneth SAILOR, RN Outcome: Progressing Goal: Understanding of discharge needs will improve 06/20/2024 0528 by Marvis Kenneth SAILOR, RN Outcome: Progressing 06/19/2024 2059 by Marvis Kenneth SAILOR, RN Outcome: Progressing   Problem: Activity: Goal: Ability to avoid complications of mobility impairment will improve 06/20/2024 0528 by Marvis Kenneth SAILOR, RN Outcome: Progressing 06/19/2024 2059 by  Marvis Kenneth SAILOR, RN Outcome: Progressing Goal: Ability to tolerate increased activity will improve 06/20/2024 0528 by Marvis Kenneth SAILOR, RN Outcome: Progressing 06/19/2024 2059 by Marvis Kenneth SAILOR, RN Outcome: Progressing Goal: Will remain free from falls 06/20/2024 0528 by Marvis Kenneth SAILOR, RN Outcome: Progressing 06/19/2024 2059 by  Marvis Kenneth SAILOR, RN Outcome: Progressing   Problem: Bowel/Gastric: Goal: Gastrointestinal status for postoperative course will improve 06/20/2024 0528 by Marvis Kenneth SAILOR, RN Outcome: Progressing 06/19/2024 2059 by Marvis Kenneth SAILOR, RN Outcome: Progressing   Problem: Clinical Measurements: Goal: Ability to maintain clinical measurements within normal limits will improve 06/20/2024 0528 by Marvis Kenneth SAILOR, RN Outcome: Progressing 06/19/2024 2059 by Marvis Kenneth SAILOR, RN Outcome: Progressing Goal: Postoperative complications will be avoided or minimized 06/20/2024 0528 by Marvis Kenneth SAILOR, RN Outcome: Progressing 06/19/2024 2059 by Marvis Kenneth SAILOR, RN Outcome: Progressing Goal: Diagnostic test results will improve 06/20/2024 0528 by Marvis Kenneth SAILOR, RN Outcome: Progressing 06/19/2024 2059 by Marvis Kenneth SAILOR, RN Outcome: Progressing   Problem: Pain Management: Goal: Pain level will decrease 06/20/2024 0528 by Marvis Kenneth SAILOR, RN Outcome: Progressing 06/19/2024 2059 by Marvis Kenneth SAILOR, RN Outcome: Progressing   Problem: Skin Integrity: Goal: Will show signs of wound healing 06/20/2024 0528 by Marvis Kenneth SAILOR, RN Outcome: Progressing 06/19/2024 2059 by Marvis Kenneth SAILOR, RN Outcome: Progressing   Problem: Health Behavior/Discharge Planning: Goal: Identification of resources available to assist in meeting health care needs will improve 06/20/2024 0528 by Marvis Kenneth SAILOR, RN Outcome: Progressing 06/19/2024 2059 by Marvis Kenneth SAILOR, RN Outcome: Progressing   Problem: Bladder/Genitourinary: Goal: Urinary functional status for  postoperative course will improve 06/20/2024 0528 by Marvis Kenneth SAILOR, RN Outcome: Progressing 06/19/2024 2059 by Marvis Kenneth SAILOR, RN Outcome: Progressing

## 2024-06-20 NOTE — TOC Initial Note (Addendum)
 Transition of Care (TOC) - Initial/Assessment Note  Rayfield Gobble RN,BSN Transitions of Care Unit 4NP (Non Trauma)- RN Case Manager See Treatment Team for direct Phone #   Patient Details  Name: [name] MRN: [id number] Date of Birth: Aug 11, 1946  Transition of Care [facility]) CM/SW Contact:    Gobble Rayfield Hurst, RN Phone Number: 06/20/2024, 11:45 AM  Clinical Narrative:                 Spoke with pt and wife at bedside to discuss transition needs for [facility] and DME.  Per pt he has needed DME at home - no new DME needs noted at this time.  Pt voiced he prefers to return to outpt therapy and not HH.  He voiced that he wants to return to [facility] for outpt therapy needs, including doing pool exercises.   Advised pt that he would need to get clearance from Dr. Colon for when he could return to outpt therapy including the pool. Pt voiced he follow up with MD.   Wife to transport home when cleared for discharge.   No further TOC needs noted at this time.   Expected Discharge Plan: Home w Home Health Services Barriers to Discharge: No Barriers Identified   Patient Goals and CMS Choice Patient states their goals for this hospitalization and ongoing recovery are:: return home CMS Medicare.gov Compare Post Acute Care list provided to:: Patient Choice offered to / list presented to : Patient      Expected Discharge Plan and Services   Discharge Planning Services: CM Consult Post Acute Care Choice: Home Health, Durable Medical Equipment Living arrangements for the past 2 months: Single Family Home                 DME Arranged: N/A DME Agency: NA       HH Arranged: PT, Patient Refused HH (pt states he prefers to return to outpt therapy)          Prior Living Arrangements/Services Living arrangements for the past 2 months: Single Family Home Lives with:: Spouse Patient language and need for interpreter reviewed:: Yes Do you feel safe going back to the  place where you live?: Yes      Need for Family Participation in Patient Care: Yes (Comment) Care giver support system in place?: Yes (comment) Current home services: DME Criminal Activity/Legal Involvement Pertinent to Current Situation/Hospitalization: No - Comment as needed  Activities of Daily Living   ADL Screening (condition at time of admission) Independently performs ADLs?: Yes (appropriate for developmental age) Is the patient deaf or have difficulty hearing?: Yes Does the patient have difficulty seeing, even when wearing glasses/contacts?: No Does the patient have difficulty concentrating, remembering, or making decisions?: No  Permission Sought/Granted Permission sought to share information with : Facility Medical sales representative                Emotional Assessment Appearance:: Appears stated age Attitude/Demeanor/Rapport: Engaged Affect (typically observed): Accepting Orientation: : Oriented to Self, Oriented to Place, Oriented to  Time, Oriented to Situation Alcohol / Substance Use: Not Applicable Psych Involvement: No (comment)  Admission diagnosis:  Spinal stenosis, lumbar region, without neurogenic claudication [M48.061] Lumbar stenosis with neurogenic claudication [M48.062] Patient Active Problem List   Diagnosis Date Noted   Lumbar stenosis with neurogenic claudication 06/18/2024   Pure hypercholesterolemia 06/20/2023   Degenerative cervical disc 07/07/2021   Coronary artery disease involving native coronary artery of native heart with unstable angina pectoris (HCC)  CAD (coronary artery disease)    Pseudoarthrosis of lumbar spine 01/06/2020   Coronary artery calcification seen on CT scan 01/29/2019   Change in bowel habits 06/12/2018   Primary osteoarthritis of right knee 01/06/2017   S/P knee replacement 01/06/2017   Diplopia 07/10/2013   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 - [place], [place] - [address] [place] [address] Phone: [phone] Fax: [phone]     Social Drivers of Health (SDOH) Social History: SDOH Screenings   Food Insecurity: No Food Insecurity (06/19/2024)  Housing: Low Risk  (06/19/2024)  Transportation Needs: No Transportation Needs (06/19/2024)  Utilities: Not At Risk (06/19/2024)  Social Connections: Unknown (06/19/2024)  Tobacco Use: Low Risk  (06/18/2024)   SDOH Interventions:     Readmission Risk Interventions     No data to display

## 2024-06-20 NOTE — Progress Notes (Signed)
 Physical Therapy Treatment Patient Details Name: [name] MRN: [id number] DOB: 29-Dec-1945 Today's Date: 06/20/2024   History of Present Illness Pt is a 78 y/o male who presents s/p L1-L3 XLIF on 06/18/2024. PMH significant for C7 fusion, CAD, HTN, obesity, a-flutter, prior lumbar fusion.    PT Comments  Pt endorsing moderate L groin pain radiating to L testicle, states it is burning and sore and worse in sitting. Once up and moving, pain improved. Pt ambulatory around unit without AD and without difficulty, min unsteadiness with challenge but overall moving well. Pt navigated stairs proficiently, demonstrating ability to enter home upon d/c from acute setting. Pt and family with no further questions, all PT education completed.     If plan is discharge home, recommend the following: A little help with walking and/or transfers;A little help with bathing/dressing/bathroom;Assistance with cooking/housework;Assist for transportation;Help with stairs or ramp for entrance   Can travel by private vehicle        Equipment Recommendations  None recommended by PT    Recommendations for Other Services       Precautions / Restrictions Precautions Precautions: Back Precaution Booklet Issued: Yes (comment) Recall of Precautions/Restrictions: Intact Precaution/Restrictions Comments: PT reviewed BLT rules, pt manages precautions well functionally Required Braces or Orthoses: Spinal Brace Spinal Brace: Lumbar corset;Applied in sitting position Restrictions Weight Bearing Restrictions Per Provider Order: No     Mobility  Bed Mobility               General bed mobility comments: OOB in recliner upon entry    Transfers Overall transfer level: Needs assistance Equipment used: None Transfers: Sit to/from Stand Sit to Stand: Supervision                Ambulation/Gait Ambulation/Gait assistance: Supervision Gait Distance (Feet): 400 Feet Assistive device: None Gait  Pattern/deviations: Step-through pattern, Decreased stride length Gait velocity: decr     General Gait Details: mostly supervision for safety, min unsteadiness with directional changes but pt-corrected   Stairs Stairs: Yes Stairs assistance: Contact guard assist Stair Management: One rail Right, Alternating pattern, Forwards Number of Stairs: 4 General stair comments: close guard for safety, good step-over-step sequencing   Wheelchair Mobility     Tilt Bed    Modified Rankin (Stroke Patients Only)       Balance Overall balance assessment: Needs assistance Sitting-balance support: No upper extremity supported, Feet supported Sitting balance-Leahy Scale: Fair     Standing balance support: During functional activity, No upper extremity supported Standing balance-Leahy Scale: Fair                              Hotel manager: No apparent difficulties  Cognition Arousal: Alert Behavior During Therapy: WFL for tasks assessed/performed   PT - Cognitive impairments: No apparent impairments                         Following commands: Intact      Cueing Cueing Techniques: Verbal cues  Exercises   Home walking program: up and walking 1x/hour during waking hours for short household distances with supervision of family, to promote circulation, activity tolerance, and strength maintenance.      General Comments General comments (skin integrity, edema, etc.): VSS, spouse at side.  Educated on pacing and posture, mobility progressing and ADl performance with precaution adhearnce.      Pertinent Vitals/Pain Pain Assessment Pain Assessment: Faces Faces  Pain Scale: Hurts even more Pain Location: L groin into testicles Pain Descriptors / Indicators: Aching, Discomfort, Burning Pain Intervention(s): Limited activity within patient's tolerance, Monitored during session, Repositioned    Home Living                           Prior Function            PT Goals (current goals can now be found in the care plan section) Acute Rehab PT Goals Patient Stated Goal: Feel better PT Goal Formulation: With patient/family Time For Goal Achievement: 06/26/24 Potential to Achieve Goals: Good Progress towards PT goals: Progressing toward goals    Frequency    Min 5X/week      PT Plan      Co-evaluation              AM-PAC PT 6 Clicks Mobility   Outcome Measure  Help needed turning from your back to your side while in a flat bed without using bedrails?: A Little Help needed moving from lying on your back to sitting on the side of a flat bed without using bedrails?: A Little Help needed moving to and from a bed to a chair (including a wheelchair)?: A Little Help needed standing up from a chair using your arms (e.g., wheelchair or bedside chair)?: A Little Help needed to walk in hospital room?: A Little Help needed climbing 3-5 steps with a railing? : A Lot 6 Click Score: 17    End of Session Equipment Utilized During Treatment: Gait belt;Back brace Activity Tolerance: Patient tolerated treatment well Patient left: with call bell/phone within reach;with family/visitor present;in chair Nurse Communication: Mobility status PT Visit Diagnosis: Unsteadiness on feet (R26.81);Pain Pain - Right/Left: Left Pain - part of body: Hip (groin)     Time: 9089-9072 PT Time Calculation (min) (ACUTE ONLY): 17 min  Charges:    $Gait Training: 8-22 mins PT General Charges $$ ACUTE PT VISIT: 1 Visit                     Johana RAMAN, PT DPT Acute Rehabilitation Services Secure Chat Preferred  Office [phone]    Mehreen Azizi E Stroup 06/20/2024, 10:28 AM

## 2024-06-20 NOTE — Progress Notes (Signed)
 Occupational Therapy Treatment Patient Details Name: [name] MRN: [id number] DOB: 31-Dec-1945 Today's Date: 06/20/2024   History of present illness Pt is a 78 y/o male who presents s/p L1-L3 XLIF on 06/18/2024. PMH significant for C7 fusion, CAD, HTN, obesity, a-flutter, prior lumbar fusion.   OT comments  Pt progressing well towards OT goals.  Pt completing transfers and mobility in room with close supervision, progressed from RW to no AD with intermittent cueing for hand placement during transfers and posture.  ADLs with min assist for LB but reviewed compensatory techniques with slip on shoes, and supervision for brace mgmt and grooming in standing. Pt has all needed DME, good support at home.  Updated dc recommendations to no OT follow up.  Will follow acutely.       If plan is discharge home, recommend the following:  Assistance with cooking/housework;Assist for transportation;A little help with bathing/dressing/bathroom   Equipment Recommendations  None recommended by OT    Recommendations for Other Services      Precautions / Restrictions Precautions Precautions: Back Precaution Booklet Issued: Yes (comment) Recall of Precautions/Restrictions: Intact Precaution/Restrictions Comments: Reviewed handout and pt was cued for precaution. Required Braces or Orthoses: Spinal Brace Spinal Brace: Lumbar corset;Applied in sitting position (on upon entry, repositioned in sitting) Restrictions Weight Bearing Restrictions Per Provider Order: No       Mobility Bed Mobility Overal bed mobility: Needs Assistance             General bed mobility comments: OOB in recliner upon entry    Transfers Overall transfer level: Needs assistance Equipment used: Rolling walker (2 wheels) Transfers: Sit to/from Stand Sit to Stand: Supervision           General transfer comment: cueing for hand placement     Balance Overall balance assessment: Needs assistance Sitting-balance  support: No upper extremity supported, Feet supported Sitting balance-Leahy Scale: Fair     Standing balance support: Bilateral upper extremity supported, During functional activity Standing balance-Leahy Scale: Fair Standing balance comment: 0 hand support dynamically with close supervision, no LOB noted                           ADL either performed or assessed with clinical judgement   ADL Overall ADL's : Needs assistance/impaired     Grooming: Supervision/safety;Wash/dry hands;Standing Grooming Details (indicate cue type and reason): cueing for posture and techniques for back prec         Upper Body Dressing : Set up;Sitting Upper Body Dressing Details (indicate cue type and reason): assist for brace mgmt/placement Lower Body Dressing: Minimal assistance;Sit to/from stand;Cueing for back precautions Lower Body Dressing Details (indicate cue type and reason): pt needing assist for socks, able to bring legs up far enough to simulate underwear/shorts donning.  Pt plans to wear slip on shoes or have asisst from spouse Toilet Transfer: Supervision/safety;Ambulation;Rolling walker (2 wheels) Toilet Transfer Details (indicate cue type and reason): RW vs no AD         Functional mobility during ADLs: Supervision/safety General ADL Comments: initated session with RW, progressed to no AD.  close supervision for safety. intermittent cueing for upright posture    Extremity/Trunk Assessment              Vision       Perception     Praxis     Communication Communication Communication: No apparent difficulties   Cognition Arousal: Alert Behavior During Therapy: [facility] for tasks  assessed/performed Cognition: No apparent impairments                               Following commands: Intact        Cueing   Cueing Techniques: Verbal cues  Exercises      Shoulder Instructions       General Comments VSS, spouse at side.  Educated on pacing and  posture, mobility progressing and ADl performance with precaution adhearnce.    Pertinent Vitals/ Pain       Pain Assessment Pain Assessment: Faces Faces Pain Scale: Hurts a little bit Pain Location: L groin into testicles Pain Descriptors / Indicators: Aching, Discomfort Pain Intervention(s): Limited activity within patient's tolerance, Monitored during session, Repositioned, Premedicated before session  Home Living                                          Prior Functioning/Environment              Frequency  Min 2X/week        Progress Toward Goals  OT Goals(current goals can now be found in the care plan section)  Progress towards OT goals: Progressing toward goals  Acute Rehab OT Goals Patient Stated Goal: home today OT Goal Formulation: With patient Time For Goal Achievement: 07/03/24 Potential to Achieve Goals: Good  Plan      Co-evaluation                 AM-PAC OT 6 Clicks Daily Activity     Outcome Measure   Help from another person eating meals?: None Help from another person taking care of personal grooming?: A Little Help from another person toileting, which includes using toliet, bedpan, or urinal?: A Little Help from another person bathing (including washing, rinsing, drying)?: A Little Help from another person to put on and taking off regular upper body clothing?: A Lot Help from another person to put on and taking off regular lower body clothing?: A Little 6 Click Score: 18    End of Session Equipment Utilized During Treatment: Gait belt;Rolling walker (2 wheels);Back brace  OT Visit Diagnosis: Unsteadiness on feet (R26.81);Other abnormalities of gait and mobility (R26.89);Pain Pain - part of body:  (groin)   Activity Tolerance Patient tolerated treatment well   Patient Left with call bell/phone within reach;in chair;with family/visitor present;Other (comment) (PT present)   Nurse Communication Mobility status         Time: [id number] OT Time Calculation (min): 18 min  Charges: OT General Charges $OT Visit: 1 Visit OT Treatments $Self Care/Home Management : 8-22 mins  Etta NOVAK, OT Acute Rehabilitation Services Office [phone] Secure Chat Preferred    Etta GORMAN Hope 06/20/2024, 9:33 AM

## 2024-06-20 NOTE — Progress Notes (Signed)
 Order to discharge patient home. Wife at bedside. Discharge instructions/AVS given to and reviewed with patient. Education provided as needed. Patient verbalized understanding. PIV removed by this RN. Gauze dressing to back surgical incision C/D/I. Personal belongings sent home with patient. Home via private vehicle.

## 2024-06-29 ENCOUNTER — Other Ambulatory Visit: Payer: Self-pay

## 2024-06-29 ENCOUNTER — Emergency Department (HOSPITAL_COMMUNITY)

## 2024-06-29 ENCOUNTER — Inpatient Hospital Stay (HOSPITAL_COMMUNITY)
Admission: EM | Admit: 2024-06-29 | Discharge: 2024-07-02 | DRG: 940 | Disposition: A | Discharge status: Home / Self Care | Attending: Neurological Surgery | Admitting: Neurological Surgery

## 2024-06-29 ENCOUNTER — Encounter (HOSPITAL_COMMUNITY): Payer: Self-pay

## 2024-06-29 DIAGNOSIS — Z0189 Encounter for other specified special examinations: Secondary | ICD-10-CM | POA: Diagnosis not present

## 2024-06-29 DIAGNOSIS — M5441 Lumbago with sciatica, right side: Principal | ICD-10-CM

## 2024-06-29 DIAGNOSIS — Z8249 Family history of ischemic heart disease and other diseases of the circulatory system: Secondary | ICD-10-CM

## 2024-06-29 DIAGNOSIS — Z96653 Presence of artificial knee joint, bilateral: Secondary | ICD-10-CM | POA: Diagnosis present

## 2024-06-29 DIAGNOSIS — M48061 Spinal stenosis, lumbar region without neurogenic claudication: Secondary | ICD-10-CM | POA: Diagnosis not present

## 2024-06-29 DIAGNOSIS — Z79899 Other long term (current) drug therapy: Secondary | ICD-10-CM

## 2024-06-29 DIAGNOSIS — E66811 Obesity, class 1: Secondary | ICD-10-CM | POA: Diagnosis not present

## 2024-06-29 DIAGNOSIS — I2510 Atherosclerotic heart disease of native coronary artery without angina pectoris: Secondary | ICD-10-CM | POA: Diagnosis present

## 2024-06-29 DIAGNOSIS — Z981 Arthrodesis status: Secondary | ICD-10-CM | POA: Diagnosis not present

## 2024-06-29 DIAGNOSIS — I25110 Atherosclerotic heart disease of native coronary artery with unstable angina pectoris: Secondary | ICD-10-CM | POA: Diagnosis not present

## 2024-06-29 DIAGNOSIS — M4807 Spinal stenosis, lumbosacral region: Secondary | ICD-10-CM | POA: Diagnosis not present

## 2024-06-29 DIAGNOSIS — M5442 Lumbago with sciatica, left side: Secondary | ICD-10-CM | POA: Diagnosis not present

## 2024-06-29 DIAGNOSIS — N200 Calculus of kidney: Secondary | ICD-10-CM | POA: Diagnosis not present

## 2024-06-29 DIAGNOSIS — M5116 Intervertebral disc disorders with radiculopathy, lumbar region: Secondary | ICD-10-CM | POA: Diagnosis not present

## 2024-06-29 DIAGNOSIS — E785 Hyperlipidemia, unspecified: Secondary | ICD-10-CM | POA: Diagnosis not present

## 2024-06-29 DIAGNOSIS — I480 Paroxysmal atrial fibrillation: Secondary | ICD-10-CM | POA: Diagnosis not present

## 2024-06-29 DIAGNOSIS — I959 Hypotension, unspecified: Secondary | ICD-10-CM | POA: Diagnosis not present

## 2024-06-29 DIAGNOSIS — S32009A Unspecified fracture of unspecified lumbar vertebra, initial encounter for closed fracture: Secondary | ICD-10-CM | POA: Diagnosis not present

## 2024-06-29 DIAGNOSIS — M5135 Other intervertebral disc degeneration, thoracolumbar region: Secondary | ICD-10-CM | POA: Diagnosis not present

## 2024-06-29 DIAGNOSIS — I10 Essential (primary) hypertension: Secondary | ICD-10-CM | POA: Diagnosis present

## 2024-06-29 DIAGNOSIS — Z6831 Body mass index (BMI) 31.0-31.9, adult: Secondary | ICD-10-CM | POA: Diagnosis not present

## 2024-06-29 DIAGNOSIS — M5416 Radiculopathy, lumbar region: Secondary | ICD-10-CM | POA: Diagnosis present

## 2024-06-29 DIAGNOSIS — M96841 Postprocedural hematoma of a musculoskeletal structure following other procedure: Secondary | ICD-10-CM | POA: Diagnosis not present

## 2024-06-29 DIAGNOSIS — R001 Bradycardia, unspecified: Secondary | ICD-10-CM | POA: Diagnosis not present

## 2024-06-29 DIAGNOSIS — R609 Edema, unspecified: Secondary | ICD-10-CM | POA: Diagnosis not present

## 2024-06-29 DIAGNOSIS — G8918 Other acute postprocedural pain: Principal | ICD-10-CM | POA: Diagnosis present

## 2024-06-29 DIAGNOSIS — M549 Dorsalgia, unspecified: Secondary | ICD-10-CM | POA: Diagnosis not present

## 2024-06-29 DIAGNOSIS — K5730 Diverticulosis of large intestine without perforation or abscess without bleeding: Secondary | ICD-10-CM | POA: Diagnosis not present

## 2024-06-29 LAB — URINALYSIS, W/ REFLEX TO CULTURE (INFECTION SUSPECTED)
Bacteria, UA: NONE SEEN
Bilirubin Urine: NEGATIVE
Glucose, UA: NEGATIVE mg/dL
Hgb urine dipstick: NEGATIVE
Ketones, ur: NEGATIVE mg/dL
Leukocytes,Ua: NEGATIVE
Nitrite: NEGATIVE
Protein, ur: NEGATIVE mg/dL
Specific Gravity, Urine: 1.018 (ref 1.005–1.030)
pH: 5 (ref 5.0–8.0)

## 2024-06-29 LAB — COMPREHENSIVE METABOLIC PANEL WITH GFR
ALT: 26 U/L (ref 0–44)
AST: 19 U/L (ref 15–41)
Albumin: 3.5 g/dL (ref 3.5–5.0)
Alkaline Phosphatase: 70 U/L (ref 38–126)
Anion gap: 9 (ref 5–15)
BUN: 45 mg/dL — ABNORMAL HIGH (ref 8–23)
CO2: 23 mmol/L (ref 22–32)
Calcium: 9.1 mg/dL (ref 8.9–10.3)
Chloride: 103 mmol/L (ref 98–111)
Creatinine, Ser: 1.46 mg/dL — ABNORMAL HIGH (ref 0.61–1.24)
GFR, Estimated: 49 mL/min — ABNORMAL LOW (ref >60)
Glucose, Bld: 110 mg/dL — ABNORMAL HIGH (ref 70–99)
Potassium: 4.6 mmol/L (ref 3.5–5.1)
Sodium: 135 mmol/L (ref 135–145)
Total Bilirubin: 1 mg/dL (ref 0.0–1.2)
Total Protein: 6 g/dL — ABNORMAL LOW (ref 6.5–8.1)

## 2024-06-29 LAB — CBC WITH DIFFERENTIAL/PLATELET
Abs Immature Granulocytes: 0.15 K/uL — ABNORMAL HIGH (ref 0.00–0.07)
Basophils Absolute: 0 K/uL (ref 0.0–0.1)
Basophils Relative: 0 %
Eosinophils Absolute: 0.2 K/uL (ref 0.0–0.5)
Eosinophils Relative: 2 %
HCT: 36.8 % — ABNORMAL LOW (ref 39.0–52.0)
Hemoglobin: 12.2 g/dL — ABNORMAL LOW (ref 13.0–17.0)
Immature Granulocytes: 2 %
Lymphocytes Relative: 21 %
Lymphs Abs: 2.1 K/uL (ref 0.7–4.0)
MCH: 32.4 pg (ref 26.0–34.0)
MCHC: 33.2 g/dL (ref 30.0–36.0)
MCV: 97.6 fL (ref 80.0–100.0)
Monocytes Absolute: 0.9 K/uL (ref 0.1–1.0)
Monocytes Relative: 9 %
Neutro Abs: 6.9 K/uL (ref 1.7–7.7)
Neutrophils Relative %: 66 %
Platelets: 175 K/uL (ref 150–400)
RBC: 3.77 MIL/uL — ABNORMAL LOW (ref 4.22–5.81)
RDW: 13.6 % (ref 11.5–15.5)
WBC: 10.2 K/uL (ref 4.0–10.5)
nRBC: 0 % (ref 0.0–0.2)

## 2024-06-29 MED ORDER — ONDANSETRON HCL 4 MG/2ML IJ SOLN
4.0000 mg | Freq: Once | INTRAMUSCULAR | Status: AC
  Administered 2024-06-29: 4 mg via INTRAVENOUS
  Filled 2024-06-29: qty 2

## 2024-06-29 MED ORDER — HYDROMORPHONE HCL 1 MG/ML IJ SOLN
0.5000 mg | Freq: Once | INTRAMUSCULAR | Status: AC
  Administered 2024-06-29: 0.5 mg via INTRAVENOUS
  Filled 2024-06-29: qty 1

## 2024-06-29 MED ORDER — FENTANYL CITRATE PF 50 MCG/ML IJ SOSY
25.0000 ug | PREFILLED_SYRINGE | Freq: Once | INTRAMUSCULAR | Status: AC
  Administered 2024-06-29: 25 ug via INTRAVENOUS
  Filled 2024-06-29: qty 1

## 2024-06-29 MED ORDER — SODIUM CHLORIDE 0.9 % IV BOLUS
1000.0000 mL | Freq: Once | INTRAVENOUS | Status: AC
  Administered 2024-06-29: 1000 mL via INTRAVENOUS

## 2024-06-29 NOTE — ED Provider Notes (Signed)
 [place] [facility] AT [facility] Provider Note   CSN: [id number] Arrival date & time: 06/29/24  1701    Patient presents with: Abdominal Pain and Testicle Pain   [name] is a 78 y.o. male known spinal stenosis, recent surgery 06/18/2024 with Dr. Colon with neurosurgery here for evaluation of abdominal pain and back pain.  States since surgery he has had increased lower abdominal pain which goes into his back.  Taking p.o. meds without relief.  He also states since surgery he has had numbness to his lower abdomen into his scrotum and perineal region.  States he has has difficulty telling whether his bladder is full or empty.  He he denies any gross incontinence however states he has been constipated which he relates to taking more than normal pain medicine.  He is passing flatus.  He denies any dysuria or hematuria.  No chest pain, shortness of breath.  No recent falls or injuries.  States he spoke with Dr. Colon earlier in the week have a CT scan of the abdomen and back according to patient however was unable to make it due to his worsening pain.  He has been able to walk since his surgery.  He has pain down his bilateral legs and tingling to his bilateral great toes.  States his wounds open clean, dry, intact.  He has left lateral and posterior incisions.  No drainage.  States prior to surgery he had some pain in his bilateral legs and would get occasional numbness in his legs when he stood up for too long however he did not have any saddle anesthesia, incontinence, difficulty with bladder or bowel prior to surgery.   HPI     Prior to Admission medications   Medication Sig Start Date End Date Taking? Authorizing Provider  amLODipine  (NORVASC ) 5 MG tablet TAKE 1 TABLET BY MOUTH TWICE DAILY 01/25/24   Okey Vina GAILS, MD  benazepril  (LOTENSIN ) 20 MG tablet TAKE 1 TABLET BY MOUTH TWICE DAILY 01/25/24   Okey Vina GAILS, MD  celecoxib  (CELEBREX ) 200 MG capsule Take 1  capsule (200 mg total) by mouth daily as needed. 06/20/23     Cholecalciferol  (VITAMIN D3) 50 MCG (2000 UT) TABS Take 2,000 Units by mouth daily with lunch.    [provider]  cyanocobalamin  (VITAMIN B12) 1000 MCG/ML injection Inject 1,000 mcg into the muscle every 30 (thirty) days.    [provider]  dexamethasone  (DECADRON ) 1 MG tablet 2 tablets twice daily for 2 days, one tablet twice daily for 2 days, one tablet daily for 2 days. 06/20/24   Colon Shove, MD  fluocinonide (LIDEX) 0.05 % external solution Apply 1 Application topically 2 (two) times daily as needed (neck rash/irritation.). 01/10/24   [provider]  hydrochlorothiazide  (,MICROZIDE /HYDRODIURIL ,) 12.5 MG capsule Take 12.5 mg by mouth daily with lunch.     [provider]  methocarbamol  (ROBAXIN ) 500 MG tablet Take 500 mg by mouth every 8 (eight) hours as needed for muscle spasms.    [provider]  Multiple Vitamin (MULTIVITAMIN WITH MINERALS) TABS tablet Take 1 tablet by mouth every evening. Adult 50+    [provider]  nitroGLYCERIN  (NITROSTAT ) 0.4 MG SL tablet Place 1 tablet (0.4 mg total) under the tongue every 5 (five) minutes as needed. 07/01/21   Henry Manuelita NOVAK, NP  oxyCODONE  (OXY IR/ROXICODONE ) 5 MG immediate release tablet Take 1 tablet (5 mg total) by mouth every 6 (six) hours as needed. 02/17/24  pregabalin  (LYRICA ) 75 MG capsule Take 1 capsule (75 mg total) by mouth 2 (two) times daily. 06/20/24 06/20/25  Colon Shove, MD  rosuvastatin  (CRESTOR ) 10 MG tablet TAKE 1 TABLET BY MOUTH DAILY Patient taking differently: Take 10 mg by mouth at bedtime. 02/09/24   Okey Vina GAILS, MD  tadalafil  (CIALIS ) 5 MG tablet Take 1 tablet (5 mg total) by mouth daily. Patient taking differently: Take 5 mg by mouth daily as needed for erectile dysfunction. 02/09/24   McKenzie, Belvie CROME, MD    Allergies: Alfuzosin, Duloxetine  hcl, Gabapentin, Pregabalin , Tamsulosin hcl, Contrast media  [iodinated contrast media], and Niacin    Review of Systems  Constitutional: Negative.   HENT: Negative.    Respiratory: Negative.    Cardiovascular: Negative.   Gastrointestinal:  Positive for abdominal pain and constipation.  Genitourinary:  Positive for decreased urine volume, difficulty urinating and testicular pain.  Musculoskeletal:  Positive for back pain.  Skin:  Positive for color change.  Neurological:  Positive for weakness and numbness.  All other systems reviewed and are negative.   Updated Vital Signs BP (!) 143/68 (BP Location: Right Arm)   Pulse 65   Temp 98.3 F (36.8 C) (Oral)   Resp 14   SpO2 96%   Physical Exam Vitals and nursing note reviewed. Exam conducted with a chaperone present.  Constitutional:      General: He is not in acute distress.    Appearance: He is well-developed. He is not ill-appearing, toxic-appearing or diaphoretic.  HENT:     Head: Atraumatic.  Eyes:     Pupils: Pupils are equal, round, and reactive to light.  Cardiovascular:     Rate and Rhythm: Normal rate and regular rhythm.  Pulmonary:     Effort: Pulmonary effort is normal. No respiratory distress.  Abdominal:     General: Bowel sounds are normal. There is no distension.     Palpations: Abdomen is soft.     Tenderness: There is abdominal tenderness in the right lower quadrant, suprapubic area and left lower quadrant.     Comments: Generalized lower abdominal tenderness without rebound or guarding.  Incisions clean, dry, intact.  Ecchymosis left lateral abdomen.  No obvious hernias.  Genitourinary:    Comments: Normal rectal tone. Sensation to GU region however decreased at scrotum, perineal region.  RN chaperone for GU exam Musculoskeletal:        General: Normal range of motion.     Cervical back: Normal range of motion and neck supple.     Comments: Diffuse tenderness throughout lower posterior back.  Nontender bilateral lower extremities.  Skin:    General: Skin is  warm and dry.     Capillary Refill: Capillary refill takes less than 2 seconds.  Neurological:     General: No focal deficit present.     Mental Status: He is alert and oriented to person, place, and time.     Sensory: Sensory deficit present.     Comments: Decreased sensation lower abd pelvic region. Decreased sensation BIL great toe, intact strength    (all labs ordered are listed, but only abnormal results are displayed) Labs Reviewed  CBC WITH DIFFERENTIAL/PLATELET - Abnormal; Notable for the following components:      Result Value   RBC 3.77 (*)    Hemoglobin 12.2 (*)    HCT 36.8 (*)    Abs Immature Granulocytes 0.15 (*)    All other components within normal limits  COMPREHENSIVE METABOLIC PANEL WITH GFR - Abnormal;  Notable for the following components:   Glucose, Bld 110 (*)    BUN 45 (*)    Creatinine, Ser 1.46 (*)    Total Protein 6.0 (*)    GFR, Estimated 49 (*)    All other components within normal limits  URINALYSIS, W/ REFLEX TO CULTURE (INFECTION SUSPECTED) - Abnormal; Notable for the following components:   APPearance HAZY (*)    All other components within normal limits   EKG: None  Radiology: CT ABDOMEN PELVIS WO CONTRAST Result Date: 06/29/2024 CLINICAL DATA:  Acute nonlocalized abdominal pain, left back pain, status post lumbar surgery. EXAM: CT ABDOMEN AND PELVIS WITHOUT CONTRAST CT LUMBAR SPINE WITHOUT CONTRAST TECHNIQUE: Multidetector CT imaging of the abdomen and pelvis was performed following the standard protocol without IV contrast. Multiplanar CT images of the lumbar spine were then reconstructed from the contemporary CT of the Abdomen and Pelvis. RADIATION DOSE REDUCTION: This exam was performed according to the departmental dose-optimization program which includes automated exposure control, adjustment of the mA and/or kV according to patient size and/or use of iterative reconstruction technique. COMPARISON:  10/22/2007, CT lumbar spine 05/02/2024  FINDINGS: Lower chest: No acute abnormality. Extensive coronary artery calcification. Global cardiac size within normal limits. Hepatobiliary: Tiny cyst within the right hepatic dome. Liver otherwise unremarkable. Gallbladder unremarkable. No intra or extrahepatic biliary ductal dilation. Pancreas: Unremarkable Spleen: Unremarkable Adrenals/Urinary Tract: The adrenal glands are unremarkable. The kidneys are normal in size and position. Moderate bilateral perinephric stranding, nonspecific. This may relate to an underlying infectious or inflammatory process. No perinephric fluid collection, however. No hydronephrosis. 3 mm nonobstructing calculus within the interpolar region of the left kidney. No ureteral calculi. Bladder unremarkable. Stomach/Bowel: Status post sigmoid colectomy. Mild ascending and transverse colonic diverticulosis. Rim calcified nodule in the region of the cecal tip is in keeping with a small focus of fat necrosis, benign. Stomach, small bowel, and large bowel are otherwise unremarkable. No evidence of obstruction or focal inflammation. Appendix normal. No free intraperitoneal gas or fluid. Vascular/Lymphatic: Aortic atherosclerosis. No enlarged abdominal or pelvic lymph nodes. Reproductive: Prostate is unremarkable. Other: No abdominal wall hernia.  No abdominopelvic ascites. Musculoskeletal: Surgical changes of L3-S1 anterior posterior lumbar fusion with instrumentation is again identified right solid incorporation of interbody bone graft and ankylosis of the facet joints involving this segment. Since the prior examination, there has been interval extension of lumbar fusion cranially with instrumentation with incorporation of the pre-existing L3 pedicle screws and addition of bilateral pedicle screws at L1 and L2 as well as interbody bone cages at L1-2 and L2-3. There is development of acute fractures of the left anteroinferior aspect of the L1 vertebral body as well as the left anterosuperior  disc osteophyte of the L3 vertebral body. No evidence of hardware failure involving the surgical hardware of L1-L3. There is development of 6 mm of retrolisthesis of L1-2. Severe bilateral neuroforaminal narrowing at L1-2, progressive since prior examination due to retrolisthesis. Mild distraction of the L1-2 facet joints bilaterally of up to 6 mm. Resultant mild central canal stenosis at this level as result, best seen on image # [id number] series #1. No other acute bone abnormality. No lytic or blastic bone lesion. IMPRESSION: 1. Surgical changes of L3-S1 anterior and posterior lumbar fusion with instrumentation. Interval extension of surgical hardware cranially to L1 with incorporation of the pre-existing L3 pedicle screws and interbody bone cages at L1-L2 and L2-3. Interval development of 6 mm of retrolisthesis of L1 on L2 with progressive severe bilateral neuroforaminal  narrowing and mild central canal stenosis at this level. 2. Interval development of acute fractures of the left anteroinferior aspect of the L1 vertebral body as well as the left anterosuperior disc osteophyte of the L3 vertebral body. No evidence of superimposed hardware failure. 3. Moderate bilateral perinephric stranding, nonspecific. This may relate to an underlying infectious or inflammatory process. Correlation with urinalysis and renal function tests may be helpful for further evaluation. 4. Minimal nonobstructing left nephrolithiasis. 5. Mild colonic diverticulosis. 6. Extensive coronary artery calcification. 7. Aortic atherosclerosis. Aortic Atherosclerosis (ICD10-I70.0). Electronically Signed   By: Dorethia Molt M.D.   On: 06/29/2024 20:49   CT L-SPINE NO CHARGE Result Date: 06/29/2024 CLINICAL DATA:  Acute nonlocalized abdominal pain, left back pain, status post lumbar surgery. EXAM: CT ABDOMEN AND PELVIS WITHOUT CONTRAST CT LUMBAR SPINE WITHOUT CONTRAST TECHNIQUE: Multidetector CT imaging of the abdomen and pelvis was performed  following the standard protocol without IV contrast. Multiplanar CT images of the lumbar spine were then reconstructed from the contemporary CT of the Abdomen and Pelvis. RADIATION DOSE REDUCTION: This exam was performed according to the departmental dose-optimization program which includes automated exposure control, adjustment of the mA and/or kV according to patient size and/or use of iterative reconstruction technique. COMPARISON:  10/22/2007, CT lumbar spine 05/02/2024 FINDINGS: Lower chest: No acute abnormality. Extensive coronary artery calcification. Global cardiac size within normal limits. Hepatobiliary: Tiny cyst within the right hepatic dome. Liver otherwise unremarkable. Gallbladder unremarkable. No intra or extrahepatic biliary ductal dilation. Pancreas: Unremarkable Spleen: Unremarkable Adrenals/Urinary Tract: The adrenal glands are unremarkable. The kidneys are normal in size and position. Moderate bilateral perinephric stranding, nonspecific. This may relate to an underlying infectious or inflammatory process. No perinephric fluid collection, however. No hydronephrosis. 3 mm nonobstructing calculus within the interpolar region of the left kidney. No ureteral calculi. Bladder unremarkable. Stomach/Bowel: Status post sigmoid colectomy. Mild ascending and transverse colonic diverticulosis. Rim calcified nodule in the region of the cecal tip is in keeping with a small focus of fat necrosis, benign. Stomach, small bowel, and large bowel are otherwise unremarkable. No evidence of obstruction or focal inflammation. Appendix normal. No free intraperitoneal gas or fluid. Vascular/Lymphatic: Aortic atherosclerosis. No enlarged abdominal or pelvic lymph nodes. Reproductive: Prostate is unremarkable. Other: No abdominal wall hernia.  No abdominopelvic ascites. Musculoskeletal: Surgical changes of L3-S1 anterior posterior lumbar fusion with instrumentation is again identified right solid incorporation of  interbody bone graft and ankylosis of the facet joints involving this segment. Since the prior examination, there has been interval extension of lumbar fusion cranially with instrumentation with incorporation of the pre-existing L3 pedicle screws and addition of bilateral pedicle screws at L1 and L2 as well as interbody bone cages at L1-2 and L2-3. There is development of acute fractures of the left anteroinferior aspect of the L1 vertebral body as well as the left anterosuperior disc osteophyte of the L3 vertebral body. No evidence of hardware failure involving the surgical hardware of L1-L3. There is development of 6 mm of retrolisthesis of L1-2. Severe bilateral neuroforaminal narrowing at L1-2, progressive since prior examination due to retrolisthesis. Mild distraction of the L1-2 facet joints bilaterally of up to 6 mm. Resultant mild central canal stenosis at this level as result, best seen on image # [id number] series #1. No other acute bone abnormality. No lytic or blastic bone lesion. IMPRESSION: 1. Surgical changes of L3-S1 anterior and posterior lumbar fusion with instrumentation. Interval extension of surgical hardware cranially to L1 with incorporation of the pre-existing L3  pedicle screws and interbody bone cages at L1-L2 and L2-3. Interval development of 6 mm of retrolisthesis of L1 on L2 with progressive severe bilateral neuroforaminal narrowing and mild central canal stenosis at this level. 2. Interval development of acute fractures of the left anteroinferior aspect of the L1 vertebral body as well as the left anterosuperior disc osteophyte of the L3 vertebral body. No evidence of superimposed hardware failure. 3. Moderate bilateral perinephric stranding, nonspecific. This may relate to an underlying infectious or inflammatory process. Correlation with urinalysis and renal function tests may be helpful for further evaluation. 4. Minimal nonobstructing left nephrolithiasis. 5. Mild colonic diverticulosis.  6. Extensive coronary artery calcification. 7. Aortic atherosclerosis. Aortic Atherosclerosis (ICD10-I70.0). Electronically Signed   By: Dorethia Molt M.D.   On: 06/29/2024 20:49     Procedures   Medications Ordered in the ED  HYDROmorphone  (DILAUDID ) injection 0.5 mg (0.5 mg Intravenous Given 06/29/24 1752)  ondansetron  (ZOFRAN ) injection 4 mg (4 mg Intravenous Given 06/29/24 1752)  sodium chloride  0.9 % bolus 1,000 mL (0 mLs Intravenous Stopped 06/29/24 2006)  fentaNYL  (SUBLIMAZE ) injection 25 mcg (25 mcg Intravenous Given 06/29/24 2150)  fentaNYL  (SUBLIMAZE ) injection 25 mcg (25 mcg Intravenous Given 06/29/24 5586)   78 year old here for evaluation abdominal pain and back pain.  Recent spinal procedure with Dr. Colon 11 days ago.  He is having new groin numbness and difficulty telling whether his bladder is full or empty.  No recent falls or injuries.  His incisions are C/D/I.  Some ecchymosis surrounding his left flank and surgical sites however he has no rebound or guarding to his abdomen.  Will plan on CT scan, labs, pain control, touch base with neurosurgery.  Labs and imaging personally viewed and interpreted:  CBC without leukocytosis, hemoglobin 12.2 Metabolic panel creatinine 1.46 similar to baseline UA wo infection will get postvoid residual bladder scan after getting UA  Patient reassessed.  Does have rectal tone however admits to decreased sensation to perineal and scrotal region.  Discussed with neurosurgery recommends MRI  Reassessed.  Requesting additional pain medicine.  Attending Dr. Jerral spoke with Dr. Louis who reviewed images he thinks most likely post op changes, Will plan on admitting patient.  The patient appears reasonably stabilized for admission considering the current resources, flow, and capabilities available in the ED at this time, and I doubt any other [facility] requiring further screening and/or treatment in the ED prior to admission.     Clinical Course  as of 06/29/24 2354  Sat Jun 29, 2024  1853 Drop in blood pressure after Dilaudid  given.  Will give IV fluids.  Last echocardiogram showed no decreased EF. [BH]  2133 Discussed with Dr. Louis with neurosurgery.  Recommend lumbar spine without contrast, call back once resulted. [BH]  2348 Discussed with radiology, question psoas muscle abs, poss discitis/ osteo.  Dr. Jerral spoke with Dr. Louis who thought this was likely postop changes, low suspicion for infection.  He will plan on admitting patient. [BH]    Clinical Course User Index [BH] Calliope Delangel A, PA-C                                 Medical Decision Making Amount and/or Complexity of Data Reviewed Independent Historian: spouse External Data Reviewed: labs, radiology and notes. Labs: ordered. Decision-making details documented in ED Course. Radiology: ordered and independent interpretation performed. Decision-making details documented in ED Course.  Risk OTC drugs. Prescription drug  management. Decision regarding hospitalization. Diagnosis or treatment significantly limited by social determinants of health.       Final diagnoses:  Acute midline low back pain with bilateral sciatica    ED Discharge Orders     None          Mirella Gueye A, PA-C 06/29/24 2354    Garrick Charleston, MD 06/30/24 2336

## 2024-06-29 NOTE — ED Notes (Signed)
 Patient transported to MRI

## 2024-06-29 NOTE — ED Triage Notes (Addendum)
 Pt bib ems from home for back pain (lumbar and sacral. Recent back surgery on 7/1. Taking oxy for pain without relief.  Pt given 100mcg fentanyl  PTA  During triage pt states his pain is actually in his scrotum and abd, he thinks it is related to the procedure.

## 2024-06-30 DIAGNOSIS — I25110 Atherosclerotic heart disease of native coronary artery with unstable angina pectoris: Secondary | ICD-10-CM | POA: Diagnosis not present

## 2024-06-30 DIAGNOSIS — Z8249 Family history of ischemic heart disease and other diseases of the circulatory system: Secondary | ICD-10-CM | POA: Diagnosis not present

## 2024-06-30 DIAGNOSIS — E66811 Obesity, class 1: Secondary | ICD-10-CM | POA: Diagnosis present

## 2024-06-30 DIAGNOSIS — G8918 Other acute postprocedural pain: Secondary | ICD-10-CM | POA: Diagnosis present

## 2024-06-30 DIAGNOSIS — I2510 Atherosclerotic heart disease of native coronary artery without angina pectoris: Secondary | ICD-10-CM | POA: Diagnosis present

## 2024-06-30 DIAGNOSIS — M48061 Spinal stenosis, lumbar region without neurogenic claudication: Secondary | ICD-10-CM | POA: Diagnosis present

## 2024-06-30 DIAGNOSIS — Z981 Arthrodesis status: Secondary | ICD-10-CM | POA: Diagnosis not present

## 2024-06-30 DIAGNOSIS — Z6831 Body mass index (BMI) 31.0-31.9, adult: Secondary | ICD-10-CM | POA: Diagnosis not present

## 2024-06-30 DIAGNOSIS — Z96653 Presence of artificial knee joint, bilateral: Secondary | ICD-10-CM | POA: Diagnosis present

## 2024-06-30 DIAGNOSIS — E785 Hyperlipidemia, unspecified: Secondary | ICD-10-CM | POA: Diagnosis not present

## 2024-06-30 DIAGNOSIS — I480 Paroxysmal atrial fibrillation: Secondary | ICD-10-CM | POA: Diagnosis present

## 2024-06-30 DIAGNOSIS — M5416 Radiculopathy, lumbar region: Secondary | ICD-10-CM | POA: Diagnosis present

## 2024-06-30 DIAGNOSIS — Z79899 Other long term (current) drug therapy: Secondary | ICD-10-CM | POA: Diagnosis not present

## 2024-06-30 DIAGNOSIS — I10 Essential (primary) hypertension: Secondary | ICD-10-CM | POA: Diagnosis present

## 2024-06-30 DIAGNOSIS — M96841 Postprocedural hematoma of a musculoskeletal structure following other procedure: Secondary | ICD-10-CM | POA: Diagnosis present

## 2024-06-30 LAB — CBC
HCT: 35.2 % — ABNORMAL LOW (ref 39.0–52.0)
Hemoglobin: 11.8 g/dL — ABNORMAL LOW (ref 13.0–17.0)
MCH: 32.7 pg (ref 26.0–34.0)
MCHC: 33.5 g/dL (ref 30.0–36.0)
MCV: 97.5 fL (ref 80.0–100.0)
Platelets: 163 K/uL (ref 150–400)
RBC: 3.61 MIL/uL — ABNORMAL LOW (ref 4.22–5.81)
RDW: 13.4 % (ref 11.5–15.5)
WBC: 11 K/uL — ABNORMAL HIGH (ref 4.0–10.5)
nRBC: 0 % (ref 0.0–0.2)

## 2024-06-30 LAB — BASIC METABOLIC PANEL WITH GFR
Anion gap: 9 (ref 5–15)
BUN: 43 mg/dL — ABNORMAL HIGH (ref 8–23)
CO2: 23 mmol/L (ref 22–32)
Calcium: 9.1 mg/dL (ref 8.9–10.3)
Chloride: 103 mmol/L (ref 98–111)
Creatinine, Ser: 1.38 mg/dL — ABNORMAL HIGH (ref 0.61–1.24)
GFR, Estimated: 52 mL/min — ABNORMAL LOW (ref >60)
Glucose, Bld: 142 mg/dL — ABNORMAL HIGH (ref 70–99)
Potassium: 5.4 mmol/L — ABNORMAL HIGH (ref 3.5–5.1)
Sodium: 135 mmol/L (ref 135–145)

## 2024-06-30 LAB — C-REACTIVE PROTEIN: CRP: 3.5 mg/dL — ABNORMAL HIGH (ref <1.0)

## 2024-06-30 LAB — SEDIMENTATION RATE: Sed Rate: 16 mm/h (ref 0–16)

## 2024-06-30 MED ORDER — NITROGLYCERIN 0.4 MG SL SUBL: 0.4000 mg | SUBLINGUAL_TABLET | SUBLINGUAL | Status: DC | PRN

## 2024-06-30 MED ORDER — BENAZEPRIL HCL 20 MG PO TABS: 20.0000 mg | ORAL_TABLET | Freq: Two times a day (BID) | ORAL | Status: DC

## 2024-06-30 MED ORDER — HYDROMORPHONE HCL 1 MG/ML IJ SOLN
0.5000 mg | INTRAMUSCULAR | Status: DC | PRN
  Administered 2024-07-01: 1 mg via INTRAVENOUS
  Filled 2024-06-30 (×2): qty 1

## 2024-06-30 MED ORDER — ADULT MULTIVITAMIN W/MINERALS CH
1.0000 | ORAL_TABLET | Freq: Every evening | ORAL | Status: DC
  Administered 2024-06-30: 1 via ORAL
  Filled 2024-06-30 (×2): qty 1

## 2024-06-30 MED ORDER — DIPHENHYDRAMINE HCL 25 MG PO CAPS
25.0000 mg | ORAL_CAPSULE | Freq: Three times a day (TID) | ORAL | Status: DC | PRN
  Administered 2024-06-30: 25 mg via ORAL
  Filled 2024-06-30: qty 1

## 2024-06-30 MED ORDER — DEXAMETHASONE SODIUM PHOSPHATE 4 MG/ML IJ SOLN
4.0000 mg | Freq: Four times a day (QID) | INTRAMUSCULAR | Status: DC
  Administered 2024-06-30 – 2024-07-02 (×10): 4 mg via INTRAVENOUS
  Filled 2024-06-30 (×10): qty 1

## 2024-06-30 MED ORDER — ONDANSETRON HCL 4 MG PO TABS: 4.0000 mg | ORAL_TABLET | Freq: Four times a day (QID) | ORAL | Status: DC | PRN

## 2024-06-30 MED ORDER — OXYCODONE HCL 5 MG PO TABS
5.0000 mg | ORAL_TABLET | ORAL | Status: DC | PRN
  Administered 2024-06-30 – 2024-07-02 (×6): 5 mg via ORAL
  Filled 2024-06-30 (×7): qty 1

## 2024-06-30 MED ORDER — POLYETHYLENE GLYCOL 3350 17 G PO PACK
17.0000 g | PACK | Freq: Every day | ORAL | Status: DC | PRN
Start: 2024-06-30 — End: 2024-07-02
  Filled 2024-06-30: qty 1

## 2024-06-30 MED ORDER — ACETAMINOPHEN 650 MG RE SUPP: 650.0000 mg | Freq: Four times a day (QID) | RECTAL | Status: DC | PRN

## 2024-06-30 MED ORDER — SODIUM CHLORIDE 0.9 % IV SOLN: 250.0000 mL | INTRAVENOUS | Status: AC | PRN

## 2024-06-30 MED ORDER — HYDROCHLOROTHIAZIDE 12.5 MG PO CAPS: 12.5000 mg | ORAL_CAPSULE | Freq: Every day | ORAL | Status: DC

## 2024-06-30 MED ORDER — SODIUM CHLORIDE 0.9% FLUSH: 3.0000 mL | INTRAVENOUS | Status: DC | PRN

## 2024-06-30 MED ORDER — CYANOCOBALAMIN 1000 MCG/ML IJ SOLN
1000.0000 ug | INTRAMUSCULAR | Status: DC
  Administered 2024-06-30: 1000 ug via INTRAMUSCULAR
  Filled 2024-06-30: qty 1

## 2024-06-30 MED ORDER — VITAMIN D 25 MCG (1000 UNIT) PO TABS
2000.0000 [IU] | ORAL_TABLET | Freq: Every day | ORAL | Status: DC
  Administered 2024-06-30 – 2024-07-02 (×2): 2000 [IU] via ORAL
  Filled 2024-06-30 (×2): qty 2

## 2024-06-30 MED ORDER — SODIUM CHLORIDE 0.9% FLUSH
3.0000 mL | Freq: Two times a day (BID) | INTRAVENOUS | Status: DC
  Administered 2024-06-30 – 2024-07-01 (×4): 3 mL via INTRAVENOUS

## 2024-06-30 MED ORDER — AMLODIPINE BESYLATE 5 MG PO TABS
5.0000 mg | ORAL_TABLET | Freq: Two times a day (BID) | ORAL | Status: DC
Start: 2024-06-30 — End: 2024-07-02
  Administered 2024-06-30 – 2024-07-02 (×5): 5 mg via ORAL
  Filled 2024-06-30 (×5): qty 1

## 2024-06-30 MED ORDER — ROSUVASTATIN CALCIUM 5 MG PO TABS
10.0000 mg | ORAL_TABLET | Freq: Every day | ORAL | Status: DC
  Administered 2024-06-30 – 2024-07-02 (×2): 10 mg via ORAL
  Filled 2024-06-30 (×2): qty 2

## 2024-06-30 MED ORDER — METHOCARBAMOL 1000 MG/10ML IJ SOLN
500.0000 mg | Freq: Four times a day (QID) | INTRAMUSCULAR | Status: DC | PRN
  Administered 2024-07-01 – 2024-07-02 (×2): 500 mg via INTRAVENOUS
  Filled 2024-06-30 (×2): qty 10

## 2024-06-30 MED ORDER — ACETAMINOPHEN 325 MG PO TABS: 650.0000 mg | ORAL_TABLET | Freq: Four times a day (QID) | ORAL | Status: DC | PRN

## 2024-06-30 MED ORDER — PREGABALIN 75 MG PO CAPS
75.0000 mg | ORAL_CAPSULE | Freq: Two times a day (BID) | ORAL | Status: DC
  Administered 2024-06-30 – 2024-07-02 (×5): 75 mg via ORAL
  Filled 2024-06-30 (×5): qty 1

## 2024-06-30 MED ORDER — ONDANSETRON HCL 4 MG/2ML IJ SOLN: 4.0000 mg | Freq: Four times a day (QID) | INTRAMUSCULAR | Status: DC | PRN

## 2024-06-30 NOTE — H&P (Signed)
 [name] is an 78 y.o. male.   Chief Complaint: Back pain HPI: 78 year old male recently status post L1-L2 an L2-3 lateral interbody decompression and fusion with posterior percutaneous pedicle screw fixation.  Patient with postoperative pain extending into his groin bilaterally right greater than left.  Symptoms have progressively worsened over the past week.  The patient reports excruciating pain with certain movements.  He has some intermittent feelings of numbness and heaviness extending into his groin and perineum.  He has normal bowel and bladder function.  He has no weakness of his lower extremities.  Patient has no history of fever or wound drainage.  CT scan demonstrates postoperative change at L1-2 and L2-3 without evidence of complicating features.  Hardware is intact.  Patient has had an MRI scan of his lumbar spine which again demonstrates postoperative change at L1-2 and L2-3 from lateral interbody fusion.  Patient with bilateral psoas muscle fluid collections likely secondary to perioperative hemorrhage.  Disc spaces and vertebral bodies without significant signal change.  No evidence of high-grade spinal stenosis although there is some residual neuroforaminal stenosis particularly at L1-2.  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   Paroxysmal atrial flutter (HCC)     Past Surgical History:  Procedure Laterality Date   ABDOMINAL EXPOSURE N/A 01/06/2020   Procedure: ABDOMINAL EXPOSURE;  Surgeon: Oris Krystal FALCON, MD;  Location: [facility] OR;   Service: Vascular;  Laterality: N/A;   ANTERIOR LAT LUMBAR FUSION N/A 06/18/2024   Procedure: ANTERIOR LATERAL LUMBAR FUSION LUMBAR ONE-TWO, LUMBAR TWO-THREE;  Surgeon: Colon Shove, MD;  Location: [facility] OR;  Service: Neurosurgery;  Laterality: N/A;   ANTERIOR LUMBAR FUSION N/A 01/06/2020   Procedure: Lumbar Five- Sacral One  Anterior lumbar interbody fusion with infuse;  Surgeon: Colon Shove, MD;  Location[facility] OR;  Service: Neurosurgery;  Laterality: N/A;  Lumbar Five- Sacral One  Anterior lumbar interbody fusion with infuse   CERVICAL FUSION  1996, 1997   2 surgery- skull to C7   COLON RESECTION  2007   COLONOSCOPY W/ POLYPECTOMY     CORONARY ATHERECTOMY N/A 07/01/2021   Procedure: CORONARY ATHERECTOMY;  Surgeon: Verlin Lonni BIRCH, MD;  Location: [facility];  Service: Cardiovascular;  Laterality: N/A;   CORONARY PRESSURE/FFR STUDY N/A 06/18/2021   Procedure: INTRAVASCULAR PRESSURE WIRE/FFR STUDY;  Surgeon: Verlin Lonni BIRCH, MD;  Location: [facility];  Service: Cardiovascular;  Laterality: N/A;   CORONARY STENT INTERVENTION N/A 07/01/2021   Procedure: CORONARY STENT INTERVENTION;  Surgeon: Verlin Lonni BIRCH, MD;  Location: [facility];  Service: Cardiovascular;  Laterality: N/A;   CORONARY ULTRASOUND/IVUS N/A 07/01/2021   Procedure: Intravascular Ultrasound/IVUS;  Surgeon: Verlin Lonni BIRCH, MD;  Location: [facility];  Service: Cardiovascular;  Laterality: N/A;   HEMORRHOID SURGERY N/A 01/13/2016   Procedure: HEMORRHOIDECTOMY AND REMOVAL OF ANAL SKIN TAG;  Surgeon: Vicenta Poli, MD;  Location: [facility] OR;  Service: General;  Laterality: N/A;   LUMBAR PERCUTANEOUS  PEDICLE SCREW 2 LEVEL N/A 06/18/2024   Procedure: LUMBAR PERCUTANEOUS PEDICLE SCREW LUMBAR ONE-THREE;  Surgeon: Colon Shove, MD;  Location: [facility] OR;  Service: Neurosurgery;  Laterality: N/A;   REPLACEMENT TOTAL KNEE Left 2005   left   RIGHT/LEFT HEART CATH AND CORONARY ANGIOGRAPHY N/A  06/18/2021   Procedure: RIGHT/LEFT HEART CATH AND CORONARY ANGIOGRAPHY;  Surgeon: Verlin Lonni BIRCH, MD;  Location: [facility];  Service: Cardiovascular;  Laterality: N/A;   SYNOVIAL CYST EXCISION     lumbar spine   TONSILLECTOMY     TOTAL KNEE ARTHROPLASTY Right 01/06/2017   Procedure: RIGHT TOTAL KNEE ARTHROPLASTY;  Surgeon: Lamar Collet, MD;  Location: [facility] ORS;  Service: Orthopedics;  Laterality: Right;  Adductior Block    Family History  Problem Relation Age of Onset   Coronary artery disease Mother    Coronary artery disease Father    Colon cancer Neg Hx    Stomach cancer Neg Hx    Social History:  reports that he has never smoked. He has never used smokeless tobacco. He reports that he does not drink alcohol and does not use drugs.  Allergies:  Allergies  Allergen Reactions   Alfuzosin Other (See Comments) and Swelling    Felt winded/faint   Duloxetine  Hcl Other (See Comments) and Swelling    Nausea, dizzy, sick  Other Reaction(s): Dizziness   Gabapentin Nausea Only and Nausea And Vomiting    Other Reaction(s): dizzy   Pregabalin  Nausea Only, Other (See Comments), Nausea And Vomiting and Itching    Felt poorly  Other Reaction(s): dizzy   Tamsulosin Hcl Other (See Comments), Nausea Only and Swelling    Orthostatic hypotension   Contrast Media [Iodinated Contrast Media] Rash    Cath dye 06/18/21 rash started next day   Niacin Diarrhea    (Not in a hospital admission)   Results for orders placed or performed during the hospital encounter of 06/29/24 (from the past 48 hours)  CBC with Differential     Status: Abnormal   Collection Time: 06/29/24  5:42 PM  Result Value Ref Range   WBC 10.2 4.0 - 10.5 K/uL   RBC 3.77 (L) 4.22 - 5.81 MIL/uL   Hemoglobin 12.2 (L) 13.0 - 17.0 g/dL   HCT 63.1 (L) 60.9 - 47.9 %   MCV 97.6 80.0 - 100.0 fL   MCH 32.4 26.0 - 34.0 pg   MCHC 33.2 30.0 - 36.0 g/dL   RDW 86.3 88.4 - 84.4 %   Platelets 175 150 - 400 K/uL   nRBC  0.0 0.0 - 0.2 %   Neutrophils Relative % 66 %   Neutro Abs 6.9 1.7 - 7.7 K/uL   Lymphocytes Relative 21 %   Lymphs Abs 2.1 0.7 - 4.0 K/uL   Monocytes Relative 9 %   Monocytes Absolute 0.9 0.1 - 1.0 K/uL   Eosinophils Relative 2 %   Eosinophils Absolute 0.2 0.0 - 0.5 K/uL   Basophils Relative 0 %   Basophils Absolute 0.0 0.0 - 0.1 K/uL   Immature Granulocytes 2 %   Abs Immature Granulocytes 0.15 (H) 0.00 - 0.07 K/uL    Comment: Performed at [facility], [address] [place], [place] [address]  Comprehensive metabolic panel     Status: Abnormal   Collection Time: 06/29/24  5:42 PM  Result Value Ref Range   Sodium 135 135 - 145 mmol/L   Potassium 4.6 3.5 - 5.1 mmol/L   Chloride 103 98 - 111 mmol/L  CO2 23 22 - 32 mmol/L   Glucose, Bld 110 (H) 70 - 99 mg/dL    Comment: Glucose reference range applies only to samples taken after fasting for at least 8 hours.   BUN 45 (H) 8 - 23 mg/dL   Creatinine, Ser 8.53 (H) 0.61 - 1.24 mg/dL   Calcium  9.1 8.9 - 10.3 mg/dL   Total Protein 6.0 (L) 6.5 - 8.1 g/dL   Albumin  3.5 3.5 - 5.0 g/dL   AST 19 15 - 41 U/L   ALT 26 0 - 44 U/L   Alkaline Phosphatase 70 38 - 126 U/L   Total Bilirubin 1.0 0.0 - 1.2 mg/dL   GFR, Estimated 49 (L) >60 mL/min    Comment: (NOTE) Calculated using the CKD-EPI Creatinine Equation (2021)    Anion gap 9 5 - 15    Comment: Performed at [facility], [address], [place], [place] [address]  Urinalysis, w/ Reflex to Culture (Infection Suspected) -Urine, Clean Catch     Status: Abnormal   Collection Time: 06/29/24  9:03 PM  Result Value Ref Range   Specimen Source URINE, CLEAN CATCH    Color, Urine YELLOW YELLOW   APPearance HAZY (A) CLEAR   Specific Gravity, Urine 1.018 1.005 - 1.030   pH 5.0 5.0 - 8.0   Glucose, UA NEGATIVE NEGATIVE mg/dL   Hgb urine dipstick NEGATIVE NEGATIVE   Bilirubin Urine NEGATIVE NEGATIVE   Ketones, ur NEGATIVE NEGATIVE mg/dL   Protein, ur NEGATIVE NEGATIVE mg/dL    Nitrite NEGATIVE NEGATIVE   Leukocytes,Ua NEGATIVE NEGATIVE   RBC / HPF 0-5 0 - 5 RBC/hpf   WBC, UA 6-10 0 - 5 WBC/hpf    Comment:        Reflex urine culture not performed if WBC <=10, OR if Squamous epithelial cells >5. If Squamous epithelial cells >5 suggest recollection.    Bacteria, UA NONE SEEN NONE SEEN   Squamous Epithelial / HPF 0-5 0 - 5 /HPF   Mucus PRESENT    Hyaline Casts, UA PRESENT     Comment: Performed at [facility], [address], [place], [place] [address]   MR LUMBAR SPINE WO CONTRAST Result Date: 06/29/2024 CLINICAL DATA:  Low back pain, cauda equina syndrome suspected EXAM: MRI LUMBAR SPINE WITHOUT CONTRAST TECHNIQUE: Multiplanar, multisequence MR imaging of the lumbar spine was performed. No intravenous contrast was administered. COMPARISON:  MRI lumbar spine March 21, 2024. FINDINGS: Segmentation:  Standard. Alignment:  No substantial sagittal subluxation. Vertebrae: L1-S1 PLIF. Metallic artifact from hardware limits assessment. Possible edema within the disc space anteriorly at L1-L2 and potentially at L2-L3 although the interbody spacers limits assessment substantially. No significant bone marrow edema. Conus medullaris and cauda equina: Conus extends to the T12-L1 level. Conus and cauda equina appear normal. Paraspinal and other soft tissues: Edema fluid collections in the psoas musculature spanning from L1-L2 to L3 and measuring up to 3.6 x 2.1 cm on the left at L2-L3 and 2.7 x 1.3 cm on the right at L1-L2.The largest collection on the left is contiguous with the L2-L3 disc space. Disc levels: T12-L1: Disc bulging endplate spurring with bilateral facet arthropathy. Mild canal stenosis. Moderate bilateral foraminal stenosis. Bilateral facet joint effusions. L1-L2: Disc bulging and endplate spurring with mild canal stenosis. Bilateral facet arthropathy. Canal stenosis is improved. L2-L3: Broad disc bulging and left eccentric endplate spurring. Mild canal stenosis  and moderate left foraminal stenosis. Canal stenosis is improved. L3-L4: PLIF.  Patent canal  and foramina. L4-L5: PLIF.  Patent canal and foramina. L5-S1: PLIF.  Patent canal and foramina. IMPRESSION: 1. Edema and fluid collections in bilateral psoas musculature spanning from L1-L2 to L3, detailed above and suspicious for cellulitis and intramuscular/paraspinal abscesses. The largest collection on the left is contiguous with the L2-L3 disc space. 2. Metallic artifact from interbody spacers and fusion hardware limits assessment; however, suspect edema within the disc space anteriorly at L1-L2 and potentially at L2-L3 suggestive of discitis given the above findings. 3. If the patient is able, consider postcontrast imaging for further evaluation of the above findings. 4. Interval extension of the posterior fusion which now extends from L1-S1 with improved canal stenosis as detailed above. Findings discussed with [name], p.a. via telephone at 11:48 p.m. Electronically Signed   By: [name] [name] M.D.   On: 06/29/2024 23:53   CT ABDOMEN PELVIS WO CONTRAST Result Date: 06/29/2024 CLINICAL DATA:  Acute nonlocalized abdominal pain, left back pain, status post lumbar surgery. EXAM: CT ABDOMEN AND PELVIS WITHOUT CONTRAST CT LUMBAR SPINE WITHOUT CONTRAST TECHNIQUE: Multidetector CT imaging of the abdomen and pelvis was performed following the standard protocol without IV contrast. Multiplanar CT images of the lumbar spine were then reconstructed from the contemporary CT of the Abdomen and Pelvis. RADIATION DOSE REDUCTION: This exam was performed according to the departmental dose-optimization program which includes automated exposure control, adjustment of the mA and/or kV according to patient size and/or use of iterative reconstruction technique. COMPARISON:  10/22/2007, CT lumbar spine 05/02/2024 FINDINGS: Lower chest: No acute abnormality. Extensive coronary artery calcification. Global cardiac size within  normal limits. Hepatobiliary: Tiny cyst within the right hepatic dome. Liver otherwise unremarkable. Gallbladder unremarkable. No intra or extrahepatic biliary ductal dilation. Pancreas: Unremarkable Spleen: Unremarkable Adrenals/Urinary Tract: The adrenal glands are unremarkable. The kidneys are normal in size and position. Moderate bilateral perinephric stranding, nonspecific. This may relate to an underlying infectious or inflammatory process. No perinephric fluid collection, however. No hydronephrosis. 3 mm nonobstructing calculus within the interpolar region of the left kidney. No ureteral calculi. Bladder unremarkable. Stomach/Bowel: Status post sigmoid colectomy. Mild ascending and transverse colonic diverticulosis. Rim calcified nodule in the region of the cecal tip is in keeping with a small focus of fat necrosis, benign. Stomach, small bowel, and large bowel are otherwise unremarkable. No evidence of obstruction or focal inflammation. Appendix normal. No free intraperitoneal gas or fluid. Vascular/Lymphatic: Aortic atherosclerosis. No enlarged abdominal or pelvic lymph nodes. Reproductive: Prostate is unremarkable. Other: No abdominal wall hernia.  No abdominopelvic ascites. Musculoskeletal: Surgical changes of L3-S1 anterior posterior lumbar fusion with instrumentation is again identified right solid incorporation of interbody bone graft and ankylosis of the facet joints involving this segment. Since the prior examination, there has been interval extension of lumbar fusion cranially with instrumentation with incorporation of the pre-existing L3 pedicle screws and addition of bilateral pedicle screws at L1 and L2 as well as interbody bone cages at L1-2 and L2-3. There is development of acute fractures of the left anteroinferior aspect of the L1 vertebral body as well as the left anterosuperior disc osteophyte of the L3 vertebral body. No evidence of hardware failure involving the surgical hardware of  L1-L3. There is development of 6 mm of retrolisthesis of L1-2. Severe bilateral neuroforaminal narrowing at L1-2, progressive since prior examination due to retrolisthesis. Mild distraction of the L1-2 facet joints bilaterally of up to 6 mm. Resultant mild central canal stenosis at this level as result, best seen on image # [id number] series #  1. No other acute bone abnormality. No lytic or blastic bone lesion. IMPRESSION: 1. Surgical changes of L3-S1 anterior and posterior lumbar fusion with instrumentation. Interval extension of surgical hardware cranially to L1 with incorporation of the pre-existing L3 pedicle screws and interbody bone cages at L1-L2 and L2-3. Interval development of 6 mm of retrolisthesis of L1 on L2 with progressive severe bilateral neuroforaminal narrowing and mild central canal stenosis at this level. 2. Interval development of acute fractures of the left anteroinferior aspect of the L1 vertebral body as well as the left anterosuperior disc osteophyte of the L3 vertebral body. No evidence of superimposed hardware failure. 3. Moderate bilateral perinephric stranding, nonspecific. This may relate to an underlying infectious or inflammatory process. Correlation with urinalysis and renal function tests may be helpful for further evaluation. 4. Minimal nonobstructing left nephrolithiasis. 5. Mild colonic diverticulosis. 6. Extensive coronary artery calcification. 7. Aortic atherosclerosis. Aortic Atherosclerosis (ICD10-I70.0). Electronically Signed   By: [name] M.D.   On: 06/29/2024 20:49   CT L-SPINE NO CHARGE Result Date: 06/29/2024 CLINICAL DATA:  Acute nonlocalized abdominal pain, left back pain, status post lumbar surgery. EXAM: CT ABDOMEN AND PELVIS WITHOUT CONTRAST CT LUMBAR SPINE WITHOUT CONTRAST TECHNIQUE: Multidetector CT imaging of the abdomen and pelvis was performed following the standard protocol without IV contrast. Multiplanar CT images of the lumbar spine were then  reconstructed from the contemporary CT of the Abdomen and Pelvis. RADIATION DOSE REDUCTION: This exam was performed according to the departmental dose-optimization program which includes automated exposure control, adjustment of the mA and/or kV according to patient size and/or use of iterative reconstruction technique. COMPARISON:  10/22/2007, CT lumbar spine 05/02/2024 FINDINGS: Lower chest: No acute abnormality. Extensive coronary artery calcification. Global cardiac size within normal limits. Hepatobiliary: Tiny cyst within the right hepatic dome. Liver otherwise unremarkable. Gallbladder unremarkable. No intra or extrahepatic biliary ductal dilation. Pancreas: Unremarkable Spleen: Unremarkable Adrenals/Urinary Tract: The adrenal glands are unremarkable. The kidneys are normal in size and position. Moderate bilateral perinephric stranding, nonspecific. This may relate to an underlying infectious or inflammatory process. No perinephric fluid collection, however. No hydronephrosis. 3 mm nonobstructing calculus within the interpolar region of the left kidney. No ureteral calculi. Bladder unremarkable. Stomach/Bowel: Status post sigmoid colectomy. Mild ascending and transverse colonic diverticulosis. Rim calcified nodule in the region of the cecal tip is in keeping with a small focus of fat necrosis, benign. Stomach, small bowel, and large bowel are otherwise unremarkable. No evidence of obstruction or focal inflammation. Appendix normal. No free intraperitoneal gas or fluid. Vascular/Lymphatic: Aortic atherosclerosis. No enlarged abdominal or pelvic lymph nodes. Reproductive: Prostate is unremarkable. Other: No abdominal wall hernia.  No abdominopelvic ascites. Musculoskeletal: Surgical changes of L3-S1 anterior posterior lumbar fusion with instrumentation is again identified right solid incorporation of interbody bone graft and ankylosis of the facet joints involving this segment. Since the prior examination,  there has been interval extension of lumbar fusion cranially with instrumentation with incorporation of the pre-existing L3 pedicle screws and addition of bilateral pedicle screws at L1 and L2 as well as interbody bone cages at L1-2 and L2-3. There is development of acute fractures of the left anteroinferior aspect of the L1 vertebral body as well as the left anterosuperior disc osteophyte of the L3 vertebral body. No evidence of hardware failure involving the surgical hardware of L1-L3. There is development of 6 mm of retrolisthesis of L1-2. Severe bilateral neuroforaminal narrowing at L1-2, progressive since prior examination due to retrolisthesis. Mild distraction of the  L1-2 facet joints bilaterally of up to 6 mm. Resultant mild central canal stenosis at this level as result, best seen on image # [id number] series #1. No other acute bone abnormality. No lytic or blastic bone lesion. IMPRESSION: 1. Surgical changes of L3-S1 anterior and posterior lumbar fusion with instrumentation. Interval extension of surgical hardware cranially to L1 with incorporation of the pre-existing L3 pedicle screws and interbody bone cages at L1-L2 and L2-3. Interval development of 6 mm of retrolisthesis of L1 on L2 with progressive severe bilateral neuroforaminal narrowing and mild central canal stenosis at this level. 2. Interval development of acute fractures of the left anteroinferior aspect of the L1 vertebral body as well as the left anterosuperior disc osteophyte of the L3 vertebral body. No evidence of superimposed hardware failure. 3. Moderate bilateral perinephric stranding, nonspecific. This may relate to an underlying infectious or inflammatory process. Correlation with urinalysis and renal function tests may be helpful for further evaluation. 4. Minimal nonobstructing left nephrolithiasis. 5. Mild colonic diverticulosis. 6. Extensive coronary artery calcification. 7. Aortic atherosclerosis. Aortic Atherosclerosis (ICD10-I70.0).  Electronically Signed   By: [name] M.D.   On: 06/29/2024 20:49    Pertinent items noted in HPI and remainder of comprehensive ROS otherwise negative.  Blood pressure (!) 143/68, pulse 65, temperature 98.3 F (36.8 C), temperature source Oral, resp. rate 14, SpO2 96%.  Patient is awake and alert.  He is oriented and appropriate.  Speech is fluent.  Judgment insight are intact.  Cranial nerve function normal bladder.  Motor examination reveals intact motor strength in both upper and lower extremities bilaterally.  Sensory examination with some patchy sensory loss along his L1 versus L2 nerve root distributions bilaterally.  Wounds clean and dry.  Chest and abdomen benign.  Extremities free from injury deformity. Assessment/Plan Status post L1-2 and L2-3 lateral decompression and fusion.  Patient's postoperative course complicated by extreme pain and likely bilateral psoas hematomas with upper lumbar plexus irritation.  No indication for surgical intervention at present.  Check inflammatory myelosclerosis although I believe infection is very unlikely.  Plan to admit for pain control.  [name] 06/30/2024, 12:14 AM

## 2024-06-30 NOTE — Progress Notes (Signed)
 PT Cancellation Note  Patient Details Name: [name] MRN: [id number] DOB: 1946/10/03   Cancelled Treatment:    Reason Eval/Treat Not Completed: Pt declined therapy today as he has company and does not want to get out of bed today. Pt kindly requests to work with therapy tomorrow. Acute PT to re-attempt as able.  Kate ORN, PT, DPT Secure Chat Preferred  Rehab Office [phone]  Kate BRAVO Wendolyn 06/30/2024, 9:21 AM

## 2024-06-30 NOTE — Progress Notes (Signed)
 Patient admitted last night with postoperative pain crisis.  Patient continues to have pain with sitting upright or with movement.  No new radiating numbness or weakness.  C-reactive protein level mildly elevated but his sedimentation rate is normal.  I believe the patient has evidence of bilateral psoas hemorrhage secondary to irritation of his upper lumbar plexus with associated pain.  At this point we will work toward efforts at pain control and slow mobilization.  No indication for any operative intervention at present.

## 2024-06-30 NOTE — Progress Notes (Signed)
 OT Cancellation Note  Patient Details Name: [name] MRN: [id number] DOB: 10/25/1946   Cancelled Treatment:    Reason Eval/Treat Not Completed: Patient declined, no reason specified.  Patient requesting therapy to wait until 7/14.  Spoke with Dr Louis, he advised it was ok to wait until next date.    Eymi Lipuma D Taneal Sonntag 06/30/2024, 12:24 PM 06/30/2024  RP, OTR/L  Acute Rehabilitation Services  Office:  [phone]

## 2024-06-30 NOTE — Plan of Care (Signed)
  Problem: Education: Goal: Knowledge of General Education information will improve Description: Including pain rating scale, medication(s)/side effects and non-pharmacologic comfort measures Outcome: Progressing   Problem: Health Behavior/Discharge Planning: Goal: Ability to manage health-related needs will improve Outcome: Progressing   Problem: Activity: Goal: Risk for activity intolerance will decrease Outcome: Progressing   Problem: Elimination: Goal: Will not experience complications related to bowel motility Outcome: Progressing   Problem: Pain Managment: Goal: General experience of comfort will improve and/or be controlled Outcome: Progressing   Problem: Safety: Goal: Ability to remain free from injury will improve Outcome: Progressing   Problem: Skin Integrity: Goal: Risk for impaired skin integrity will decrease Outcome: Progressing

## 2024-06-30 NOTE — Progress Notes (Addendum)
 Received pt from ED via stretcher, alert and oriented X4, stood at bedside when transferring from bed to bed, with complaint of back pain PS 7/10, oriented to unit and surrounding, keep call bell within reach. 0150 given pain meds as prn.

## 2024-07-01 ENCOUNTER — Inpatient Hospital Stay (HOSPITAL_COMMUNITY)

## 2024-07-01 ENCOUNTER — Other Ambulatory Visit: Payer: Self-pay

## 2024-07-01 ENCOUNTER — Inpatient Hospital Stay (HOSPITAL_COMMUNITY): Admitting: Anesthesiology

## 2024-07-01 ENCOUNTER — Encounter (HOSPITAL_COMMUNITY)
Admission: EM | Disposition: A | Discharge status: Home / Self Care | Payer: Self-pay | Source: Home / Self Care | Attending: Neurosurgery

## 2024-07-01 ENCOUNTER — Encounter (HOSPITAL_COMMUNITY): Payer: Self-pay | Admitting: Neurological Surgery

## 2024-07-01 DIAGNOSIS — M5416 Radiculopathy, lumbar region: Secondary | ICD-10-CM

## 2024-07-01 DIAGNOSIS — I25110 Atherosclerotic heart disease of native coronary artery with unstable angina pectoris: Secondary | ICD-10-CM

## 2024-07-01 DIAGNOSIS — E785 Hyperlipidemia, unspecified: Secondary | ICD-10-CM | POA: Diagnosis not present

## 2024-07-01 DIAGNOSIS — I10 Essential (primary) hypertension: Secondary | ICD-10-CM | POA: Diagnosis not present

## 2024-07-01 HISTORY — PX: LUMBAR LAMINECTOMY/DECOMPRESSION MICRODISCECTOMY: SHX5026

## 2024-07-01 LAB — POCT I-STAT, CHEM 8
BUN: 39 mg/dL — ABNORMAL HIGH (ref 8–23)
Calcium, Ion: 1.26 mmol/L (ref 1.15–1.40)
Chloride: 106 mmol/L (ref 98–111)
Creatinine, Ser: 1 mg/dL (ref 0.61–1.24)
Glucose, Bld: 145 mg/dL — ABNORMAL HIGH (ref 70–99)
HCT: 33 % — ABNORMAL LOW (ref 39.0–52.0)
Hemoglobin: 11.2 g/dL — ABNORMAL LOW (ref 13.0–17.0)
Potassium: 4.6 mmol/L (ref 3.5–5.1)
Sodium: 137 mmol/L (ref 135–145)
TCO2: 22 mmol/L (ref 22–32)

## 2024-07-01 SURGERY — LUMBAR LAMINECTOMY/DECOMPRESSION MICRODISCECTOMY 1 LEVEL
Anesthesia: General | Site: Back

## 2024-07-01 MED ORDER — EPHEDRINE SULFATE-NACL 50-0.9 MG/10ML-% IV SOSY
PREFILLED_SYRINGE | INTRAVENOUS | Status: DC | PRN
  Administered 2024-07-01: 5 mg via INTRAVENOUS

## 2024-07-01 MED ORDER — THROMBIN 5000 UNITS EX SOLR
CUTANEOUS | Status: DC | PRN
  Administered 2024-07-01 (×2): 5 mL

## 2024-07-01 MED ORDER — EPHEDRINE 5 MG/ML INJ
INTRAVENOUS | Status: AC
  Filled 2024-07-01: qty 5

## 2024-07-01 MED ORDER — THROMBIN 5000 UNITS EX KIT
PACK | CUTANEOUS | Status: AC
  Filled 2024-07-01: qty 1

## 2024-07-01 MED ORDER — BUPIVACAINE HCL (PF) 0.5 % IJ SOLN
INTRAMUSCULAR | Status: DC | PRN
  Administered 2024-07-01: 5 mL
  Administered 2024-07-01: 14 mL
  Administered 2024-07-01: 11 mL

## 2024-07-01 MED ORDER — CHLORHEXIDINE GLUCONATE 0.12 % MT SOLN
OROMUCOSAL | Status: AC
  Administered 2024-07-01: 15 mL via OROMUCOSAL
  Filled 2024-07-01: qty 15

## 2024-07-01 MED ORDER — FENTANYL CITRATE (PF) 250 MCG/5ML IJ SOLN
INTRAMUSCULAR | Status: DC | PRN
  Administered 2024-07-01 (×2): 50 ug via INTRAVENOUS
  Administered 2024-07-01: 100 ug via INTRAVENOUS

## 2024-07-01 MED ORDER — DOCUSATE SODIUM 100 MG PO CAPS
100.0000 mg | ORAL_CAPSULE | Freq: Two times a day (BID) | ORAL | Status: DC
  Administered 2024-07-01 – 2024-07-02 (×2): 100 mg via ORAL
  Filled 2024-07-01 (×2): qty 1

## 2024-07-01 MED ORDER — MENTHOL 3 MG MT LOZG
1.0000 | LOZENGE | OROMUCOSAL | Status: DC | PRN
Start: 2024-07-01 — End: 2024-07-02

## 2024-07-01 MED ORDER — CEFAZOLIN SODIUM-DEXTROSE 2-3 GM-%(50ML) IV SOLR
INTRAVENOUS | Status: DC | PRN
  Administered 2024-07-01: 2 g via INTRAVENOUS

## 2024-07-01 MED ORDER — CHLORHEXIDINE GLUCONATE 0.12 % MT SOLN: 15.0000 mL | Freq: Once | OROMUCOSAL | Status: AC

## 2024-07-01 MED ORDER — FENTANYL CITRATE (PF) 250 MCG/5ML IJ SOLN
INTRAMUSCULAR | Status: AC
  Filled 2024-07-01: qty 5

## 2024-07-01 MED ORDER — SUCCINYLCHOLINE CHLORIDE 200 MG/10ML IV SOSY: PREFILLED_SYRINGE | INTRAVENOUS | Status: DC | PRN

## 2024-07-01 MED ORDER — LIDOCAINE 2% (20 MG/ML) 5 ML SYRINGE
INTRAMUSCULAR | Status: AC
  Filled 2024-07-01: qty 5

## 2024-07-01 MED ORDER — HYDROMORPHONE HCL 1 MG/ML IJ SOLN: 1.0000 mg | INTRAMUSCULAR | Status: DC | PRN

## 2024-07-01 MED ORDER — SODIUM CHLORIDE 0.9 % IV SOLN
250.0000 mL | INTRAVENOUS | Status: DC
  Administered 2024-07-01: 250 mL via INTRAVENOUS

## 2024-07-01 MED ORDER — SENNA 8.6 MG PO TABS
1.0000 | ORAL_TABLET | Freq: Two times a day (BID) | ORAL | Status: DC
Start: 2024-07-01 — End: 2024-07-02
  Administered 2024-07-01 – 2024-07-02 (×2): 8.6 mg via ORAL
  Filled 2024-07-01 (×2): qty 1

## 2024-07-01 MED ORDER — SUGAMMADEX SODIUM 200 MG/2ML IV SOLN
INTRAVENOUS | Status: DC | PRN
  Administered 2024-07-01: 200 mg via INTRAVENOUS

## 2024-07-01 MED ORDER — PHENOL 1.4 % MT LIQD: 1.0000 | OROMUCOSAL | Status: DC | PRN

## 2024-07-01 MED ORDER — PROPOFOL 10 MG/ML IV BOLUS
INTRAVENOUS | Status: DC | PRN
  Administered 2024-07-01: 100 mg via INTRAVENOUS

## 2024-07-01 MED ORDER — 0.9 % SODIUM CHLORIDE (POUR BTL) OPTIME
TOPICAL | Status: DC | PRN
  Administered 2024-07-01: 1000 mL

## 2024-07-01 MED ORDER — CEFAZOLIN SODIUM-DEXTROSE 2-4 GM/100ML-% IV SOLN
2.0000 g | Freq: Three times a day (TID) | INTRAVENOUS | Status: AC
  Administered 2024-07-01 – 2024-07-02 (×2): 2 g via INTRAVENOUS
  Filled 2024-07-01 (×2): qty 100

## 2024-07-01 MED ORDER — CEFAZOLIN SODIUM-DEXTROSE 2-4 GM/100ML-% IV SOLN
INTRAVENOUS | Status: AC
  Filled 2024-07-01: qty 100

## 2024-07-01 MED ORDER — LIDOCAINE 2% (20 MG/ML) 5 ML SYRINGE
INTRAMUSCULAR | Status: DC | PRN
  Administered 2024-07-01: 100 mg via INTRAVENOUS

## 2024-07-01 MED ORDER — ROCURONIUM BROMIDE 10 MG/ML (PF) SYRINGE
PREFILLED_SYRINGE | INTRAVENOUS | Status: AC
Start: 2024-07-01 — End: 2024-07-01
  Filled 2024-07-01: qty 10

## 2024-07-01 MED ORDER — PHENYLEPHRINE 80 MCG/ML (10ML) SYRINGE FOR IV PUSH (FOR BLOOD PRESSURE SUPPORT)
PREFILLED_SYRINGE | INTRAVENOUS | Status: AC
Start: 2024-07-01 — End: 2024-07-01
  Filled 2024-07-01: qty 10

## 2024-07-01 MED ORDER — DEXAMETHASONE SODIUM PHOSPHATE 10 MG/ML IJ SOLN
INTRAMUSCULAR | Status: AC
  Filled 2024-07-01: qty 1

## 2024-07-01 MED ORDER — ACETAMINOPHEN 325 MG PO TABS: 650.0000 mg | ORAL_TABLET | ORAL | Status: DC | PRN

## 2024-07-01 MED ORDER — LACTATED RINGERS IV SOLN: INTRAVENOUS | Status: AC

## 2024-07-01 MED ORDER — DEXAMETHASONE SODIUM PHOSPHATE 10 MG/ML IJ SOLN
INTRAMUSCULAR | Status: DC | PRN
  Administered 2024-07-01: 5 mg via INTRAVENOUS

## 2024-07-01 MED ORDER — ONDANSETRON HCL 4 MG/2ML IJ SOLN
INTRAMUSCULAR | Status: AC
  Filled 2024-07-01: qty 2

## 2024-07-01 MED ORDER — PROPOFOL 10 MG/ML IV BOLUS
INTRAVENOUS | Status: AC
  Filled 2024-07-01: qty 20

## 2024-07-01 MED ORDER — FLEET ENEMA RE ENEM: 1.0000 | ENEMA | Freq: Once | RECTAL | Status: DC | PRN

## 2024-07-01 MED ORDER — ORAL CARE MOUTH RINSE: 15.0000 mL | Freq: Once | OROMUCOSAL | Status: AC

## 2024-07-01 MED ORDER — LIDOCAINE-EPINEPHRINE 1 %-1:100000 IJ SOLN
INTRAMUSCULAR | Status: AC
  Filled 2024-07-01: qty 1

## 2024-07-01 MED ORDER — SODIUM CHLORIDE 0.9% FLUSH
3.0000 mL | Freq: Two times a day (BID) | INTRAVENOUS | Status: DC
  Administered 2024-07-01: 3 mL via INTRAVENOUS

## 2024-07-01 MED ORDER — SUGAMMADEX SODIUM 200 MG/2ML IV SOLN
INTRAVENOUS | Status: AC
  Filled 2024-07-01: qty 2

## 2024-07-01 MED ORDER — BISACODYL 10 MG RE SUPP: 10.0000 mg | Freq: Every day | RECTAL | Status: DC | PRN

## 2024-07-01 MED ORDER — BUPIVACAINE HCL (PF) 0.5 % IJ SOLN
INTRAMUSCULAR | Status: AC
  Filled 2024-07-01: qty 30

## 2024-07-01 MED ORDER — LIDOCAINE-EPINEPHRINE 1 %-1:100000 IJ SOLN
INTRAMUSCULAR | Status: DC | PRN
  Administered 2024-07-01: 5 mL

## 2024-07-01 MED ORDER — ALBUMIN HUMAN 5 % IV SOLN
INTRAVENOUS | Status: DC | PRN
Start: 2024-07-01 — End: 2024-07-01

## 2024-07-01 MED ORDER — LACTATED RINGERS IV SOLN: INTRAVENOUS | Status: DC

## 2024-07-01 MED ORDER — SODIUM CHLORIDE 0.9% FLUSH: 3.0000 mL | INTRAVENOUS | Status: DC | PRN

## 2024-07-01 MED ORDER — ACETAMINOPHEN 650 MG RE SUPP: 650.0000 mg | RECTAL | Status: DC | PRN

## 2024-07-01 MED ORDER — ROCURONIUM BROMIDE 10 MG/ML (PF) SYRINGE
PREFILLED_SYRINGE | INTRAVENOUS | Status: DC | PRN
  Administered 2024-07-01: 50 mg via INTRAVENOUS
  Administered 2024-07-01 (×2): 20 mg via INTRAVENOUS

## 2024-07-01 MED ORDER — ONDANSETRON HCL 4 MG/2ML IJ SOLN
INTRAMUSCULAR | Status: DC | PRN
  Administered 2024-07-01: 4 mg via INTRAVENOUS

## 2024-07-01 SURGICAL SUPPLY — 45 items
BAG COUNTER SPONGE SURGICOUNT (BAG) ×1 IMPLANT
BAND RUBBER #18 3X1/16 STRL (MISCELLANEOUS) ×2 IMPLANT
BLADE CLIPPER SURG (BLADE) IMPLANT
BUR ACORN 6.0 (BURR) IMPLANT
BUR MATCHSTICK NEURO 3.0 LAGG (BURR) ×1 IMPLANT
CANISTER SUCTION 3000ML PPV (SUCTIONS) ×1 IMPLANT
DERMABOND ADVANCED .7 DNX12 (GAUZE/BANDAGES/DRESSINGS) ×1 IMPLANT
DEVICE DISSECT PLASMABLAD 3.0S (MISCELLANEOUS) ×1 IMPLANT
DRAPE HALF SHEET 40X57 (DRAPES) IMPLANT
DRAPE LAPAROTOMY T 102X78X121 (DRAPES) ×1 IMPLANT
DRAPE MICROSCOPE SLANT 54X150 (MISCELLANEOUS) IMPLANT
DRSG OPSITE 4X5.5 SM (GAUZE/BANDAGES/DRESSINGS) IMPLANT
DURAPREP 26ML APPLICATOR (WOUND CARE) ×1 IMPLANT
ELECTRODE REM PT RTRN 9FT ADLT (ELECTROSURGICAL) ×1 IMPLANT
GAUZE 4X4 16PLY ~~LOC~~+RFID DBL (SPONGE) IMPLANT
GAUZE SPONGE 4X4 12PLY STRL (GAUZE/BANDAGES/DRESSINGS) ×1 IMPLANT
GLOVE BIOGEL PI IND STRL 8.5 (GLOVE) ×1 IMPLANT
GLOVE ECLIPSE 8.5 STRL (GLOVE) ×1 IMPLANT
GOWN STRL REUS W/ TWL LRG LVL3 (GOWN DISPOSABLE) IMPLANT
GOWN STRL REUS W/ TWL XL LVL3 (GOWN DISPOSABLE) IMPLANT
GOWN STRL REUS W/TWL 2XL LVL3 (GOWN DISPOSABLE) ×1 IMPLANT
HEMOSTAT POWDER KIT SURGIFOAM (HEMOSTASIS) ×1 IMPLANT
KIT BASIN OR (CUSTOM PROCEDURE TRAY) ×1 IMPLANT
KIT TURNOVER KIT B (KITS) ×1 IMPLANT
NDL HYPO 22X1.5 SAFETY MO (MISCELLANEOUS) ×1 IMPLANT
NDL SPNL 20GX3.5 QUINCKE YW (NEEDLE) IMPLANT
NEEDLE HYPO 22X1.5 SAFETY MO (MISCELLANEOUS) ×1 IMPLANT
NEEDLE SPNL 20GX3.5 QUINCKE YW (NEEDLE) IMPLANT
NS IRRIG 1000ML POUR BTL (IV SOLUTION) ×1 IMPLANT
PACK LAMINECTOMY NEURO (CUSTOM PROCEDURE TRAY) ×1 IMPLANT
PAD ARMBOARD POSITIONER FOAM (MISCELLANEOUS) ×3 IMPLANT
PATTIES SURGICAL .5 X.5 (GAUZE/BANDAGES/DRESSINGS) ×1 IMPLANT
PATTIES SURGICAL .5 X1 (DISPOSABLE) ×1 IMPLANT
PATTIES SURGICAL 1X1 (DISPOSABLE) ×1 IMPLANT
SPIKE FLUID TRANSFER (MISCELLANEOUS) ×1 IMPLANT
SPONGE SURGIFOAM ABS GEL SZ50 (HEMOSTASIS) IMPLANT
SUT VIC AB 1 CT1 18XBRD ANBCTR (SUTURE) ×1 IMPLANT
SUT VIC AB 2-0 CP2 18 (SUTURE) ×1 IMPLANT
SUT VIC AB 3-0 SH 8-18 (SUTURE) ×1 IMPLANT
SUT VIC AB 4-0 RB1 18 (SUTURE) ×1 IMPLANT
SYR 30ML SLIP (SYRINGE) ×1 IMPLANT
TOWEL GREEN STERILE (TOWEL DISPOSABLE) ×1 IMPLANT
TOWEL GREEN STERILE FF (TOWEL DISPOSABLE) ×1 IMPLANT
TUBING FEATHERFLOW (TUBING) ×1 IMPLANT
WATER STERILE IRR 1000ML POUR (IV SOLUTION) ×1 IMPLANT

## 2024-07-01 NOTE — OR Nursing (Signed)
 The Laminectomy Foraminotomy required that Dr. Colon remove some existing lumbar fusion hardware to access the area. He did remove some hardware but put it all back and final tightened the set screws.

## 2024-07-01 NOTE — Op Note (Signed)
 Date of surgery: 07/01/2024 Preoperative diagnosis: Left lumbar radiculopathy and L1-L2 distributions status post decompression and fusion L1 L3. Postoperative diagnosis: Same Procedure: Left sided extraforaminal decompression L1-2 and L2-3 Surgeon: Victory Gens Anesthesia: General Endotracheal Indications: [name] is a 78 year old individuals had extensive lumbar decompressive and fusion surgery in the past he recently underwent decompression fusion at L1-L3 using an indirect technique.  Postoperatively has had severe left lumbar pain radiating anteriorly to the groin he was evaluated with postoperative MRI in the CT scan which demonstrated some foraminal stenosis worse at L2-3 but significant L1-2 and the foraminal and extraforaminal zones.  Is advised regarding a limited decompression in these areas.  Procedure: Patient was brought to the operating room supine on the stretcher.  After the smooth induction of general tracheal anesthesia, he was carefully turned prone.  The back was prepped with alcohol DuraPrep and draped in a sterile fashion.  An x-ray was obtained to localize the L1-2 area and a vertical incision was created in this area and a subperiosteal dissection was performed out over the hardware.  The foraminal zone was identified at L1-L2 and a second radiograph confirmed this positioning.  However was noted that immediately dorsal was recently placed a rod and posterior construct.  It was then felt that ultimately we would have to remove the rod and we did this by loosening the The Screws from L1 L3 and Removing the Rod That We Could Work in the [employer] Performing a Decompression Removing the Superior Articular Process and the Pars on the Left Side at L1-2.  This Allowed Visualization of the L1 Nerve Root and the Significant Gromis Material from around the Nerve Root Was Decompressed.  Under the Nerve Root We Sounded the Disc Space but It Was Noted to Be Intact.  There Is  Material from the Superior Articular Process of the L1-L2 Joint That Was Also Causing Some Narrowing in the Foramen and This Was Removed.  Once the Nerve Root Was Well Decompressed We Turned Our Attention to L2-L3 Here and Extraforaminal Decompression Was Carried out in a Similar Fashion However There Was Substantially More Overgrowth from the Superior Articular Process at the L2-3 Region.  A Large Amount of Soft Tissue Was Removed from This Region Which Allowed for Good Decompression of the X Neuroforaminal L2 Nerve Root.  Once the Decompression Was Completed the Nerve Root Was Sounded No Disc Material Was Noted Inferior to the Nerve Root or Ventral to It.  Hemostasis Was Achieved and Then the Rod Was Replaced Tightened the Screws in a Neutral Construct.  30 Cc of Half Percent Marcaine  Was Injected into the Paraspinous Fascia and This Time and Then the Lumbodorsal Fascia Was Reapproximated with #1 Vicryl in Interrupted Fashion 2-0 Vicryl and 3-0 Vicryl in a Subcuticular Tissues and Dermabond Placed on the Skin.  Blood Loss for the Procedure Was Estimated 400 Cc.

## 2024-07-01 NOTE — Progress Notes (Signed)
 Patient ID: [name], male   DOB: [birth year], 78 y.o.   MRN: [id number] Postop he appears to be rather comfortable not complaining of any hip or leg pain.  Motor function appears to be good in the left lower extremity.

## 2024-07-01 NOTE — Progress Notes (Signed)
 Patient ID: [name], male   DOB: 06-07-1946, 78 y.o.   MRN: [id number] I have reviewed the MRI and CT scan on Mr. Rivan Siordia from this past weekend.  He is having left lumbar radicular symptoms likely in an L1 and L2 distribution.  He has significant foraminal stenosis on the left side at L2-3 and L1-2.  I have advised laminotomy and foraminotomies on the left side.  He is agreeable to proceeding.  He has been made NPO.  We will obtain a consent.

## 2024-07-01 NOTE — Transfer of Care (Signed)
 Immediate Anesthesia Transfer of Care Note  Patient: [name]  Procedure(s) Performed: LAMINECTOMY AND FORAMINOTOMY LUMBAR ONE, TWO, LUMBAR TWO, THREE LEFT (Back)  Patient Location: PACU  Anesthesia Type:General  Level of Consciousness: awake, alert , and oriented  Airway & Oxygen Therapy: Patient Spontanous Breathing and Patient connected to face mask oxygen  Post-op Assessment: Report given to RN and Post -op Vital signs reviewed and stable  Post vital signs: Reviewed and stable  Last Vitals:  Vitals Value Taken Time  BP 158/57 07/01/24 15:18  Temp 36.6 C 07/01/24 15:18  Pulse 64 07/01/24 15:22  Resp 10 07/01/24 15:22  SpO2 93 % 07/01/24 15:22  Vitals shown include unfiled device data.  Last Pain:  Vitals:   07/01/24 1243  TempSrc:   PainSc: 4       Patients Stated Pain Goal: 1 (07/01/24 1243)  Complications: No notable events documented.

## 2024-07-01 NOTE — Anesthesia Preprocedure Evaluation (Signed)
 Anesthesia Evaluation  Patient identified by MRN, date of birth, ID band Patient awake    Reviewed: Allergy & Precautions, NPO status , Patient's Chart, lab work & pertinent test results  History of Anesthesia Complications Negative for: history of anesthetic complications  Airway Mallampati: IV  TM Distance: >3 FB Neck ROM: Limited    Dental  (+) Dental Advisory Given   Pulmonary neg pulmonary ROS, neg sleep apnea, neg COPD, neg recent URI, Patient abstained from smoking.Not current smoker   breath sounds clear to auscultation       Cardiovascular Exercise Tolerance: Good METShypertension, Pt. on medications (-) angina + CAD and + Cardiac Stents  (-) Past MI + dysrhythmias  Rhythm:Regular    Prox LAD to Mid LAD lesion is 80% stenosed.   Mid LAD lesion is 80% stenosed.   A drug-eluting stent was successfully placed using a STENT ONYX FRONTIER 2.75X12, STENT ONYX FRONTIER 2.5 x 15, STENT ONYX FRONTIER 2.25 x 8.   Post intervention, there is a 0% residual stenosis.   Post intervention, there is a 0% residual stenosis.   Severe mid LAD stenosis Successful orbital atherectomy mid LAD with placement of 3 overlapping drug eluting stents.    1. Left ventricular ejection fraction, by estimation, is 55 to 60%. The  left ventricle has normal function. The left ventricle has no regional  wall motion abnormalities. The left ventricular internal cavity size was  mildly dilated. Left ventricular  diastolic parameters are consistent with Grade I diastolic dysfunction  (impaired relaxation).   2. Right ventricular systolic function is normal. The right ventricular  size is normal. Tricuspid regurgitation signal is inadequate for assessing  PA pressure.   3. The mitral valve is grossly normal. Trivial mitral valve  regurgitation. No evidence of mitral stenosis.   4. The aortic valve is tricuspid. There is mild calcification of the   aortic valve. Aortic valve regurgitation is not visualized. Aortic valve  sclerosis is present, with no evidence of aortic valve stenosis.   5. The inferior vena cava is dilated in size with >50% respiratory  variability, suggesting right atrial pressure of 8 mmHg.     Neuro/Psych  Neuromuscular disease    GI/Hepatic negative GI ROS, Neg liver ROS,neg GERD  ,,  Endo/Other  negative endocrine ROSneg diabetes    Renal/GU negative Renal ROS     Musculoskeletal  (+) Arthritis ,    Abdominal   Peds  Hematology negative hematology ROS (+)   Anesthesia Other Findings Past Medical History: No date: Arthritis No date: Complication of anesthesia     Comment:  FUSion of SKULL thru C7- patient has no movement of head              or neck - stiffness from fusion No date: Coronary artery disease 07/10/2013: Diplopia 2009: Diverticulosis of colon (without mention of hemorrhage)     Comment:  Colonoscopy No date: Dyslipidemia No date: Dysrhythmia     Comment:  PAF- not frequently No date: HTN (hypertension) 2009: Hx of colonic polyps     Comment:  Colonoscopy(Hyperplastic) No date: Obesity 07/10/2013: 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 No date: Paroxysmal atrial flutter (HCC)   Reproductive/Obstetrics                              Anesthesia Physical Anesthesia Plan  ASA: 3  Anesthesia Plan: General   Post-op Pain Management:    Induction: Intravenous  PONV Risk Score  and Plan: 3 and Ondansetron , Dexamethasone  and Treatment may vary due to age or medical condition  Airway Management Planned: Oral ETT and Video Laryngoscope Planned  Additional Equipment: None  Intra-op Plan:   Post-operative Plan: Extubation in OR  Informed Consent: I have reviewed the patients History and Physical, chart, labs and discussed the procedure including the risks, benefits and alternatives for the proposed anesthesia with the patient or authorized representative who  has indicated his/her understanding and acceptance.     Dental advisory given  Plan Discussed with: CRNA  Anesthesia Plan Comments: (Discussed risks of anesthesia with patient, including PONV, sore throat, lip/dental/eye damage. Rare risks discussed as well, such as cardiorespiratory and neurological sequelae, and allergic reactions. Discussed the role of CRNA in patient's perioperative care. Patient understands.)         Anesthesia Quick Evaluation

## 2024-07-01 NOTE — Anesthesia Postprocedure Evaluation (Signed)
 Anesthesia Post Note  Patient: [name]  Procedure(s) Performed: LAMINECTOMY AND FORAMINOTOMY LUMBAR ONE, TWO, LUMBAR TWO, THREE LEFT (Back)     Patient location during evaluation: PACU Anesthesia Type: General Level of consciousness: awake and alert Pain management: pain level controlled Vital Signs Assessment: post-procedure vital signs reviewed and stable Respiratory status: spontaneous breathing, nonlabored ventilation, respiratory function stable and patient connected to nasal cannula oxygen Cardiovascular status: blood pressure returned to baseline and stable Postop Assessment: no apparent nausea or vomiting Anesthetic complications: no   No notable events documented.  Last Vitals:  Vitals:   07/01/24 1545 07/01/24 1548  BP: (!) 148/56 (!) 137/56  Pulse: 63 63  Resp: 13 12  Temp:  36.6 C  SpO2: 95% 93%    Last Pain:  Vitals:   07/01/24 1626  TempSrc:   PainSc: 8                  Rome Ade

## 2024-07-01 NOTE — Anesthesia Procedure Notes (Signed)
 Procedure Name: Intubation Date/Time: 07/01/2024 1:06 PM  Performed by: Jolynn Mage, CRNAPre-anesthesia Checklist: Patient identified, Patient being monitored, Timeout performed, Emergency Drugs available and Suction available Patient Re-evaluated:Patient Re-evaluated prior to induction Oxygen Delivery Method: Circle system utilized Preoxygenation: Pre-oxygenation with 100% oxygen Induction Type: IV induction Ventilation: Oral airway inserted - appropriate to patient size and Two handed mask ventilation required Laryngoscope Size: Mac, Glidescope and 4 Grade View: Grade I Tube type: Oral Tube size: 7.5 mm Number of attempts: 1 Airway Equipment and Method: Video-laryngoscopy and Rigid stylet Placement Confirmation: ETT inserted through vocal cords under direct vision, positive ETCO2 and breath sounds checked- equal and bilateral Secured at: 22 cm Tube secured with: Tape Dental Injury: Teeth and Oropharynx as per pre-operative assessment  Difficulty Due To: Difficult Airway- due to reduced neck mobility

## 2024-07-01 NOTE — Progress Notes (Signed)
 PT Cancellation Note  Patient Details Name: [name] MRN: [id number] DOB: [birth year]   Cancelled Treatment:    Reason Eval/Treat Not Completed: Other (comment) Pt declining -- upcoming planned L sided laminotomy and foraminotomies.   Aleck Daring, PT, DPT Acute Rehabilitation Services Office [phone]    [name] 07/01/2024, 9:33 AM

## 2024-07-01 NOTE — Plan of Care (Signed)

## 2024-07-01 NOTE — Progress Notes (Signed)
 OT Cancellation Note  Patient Details Name: QUAMIR WILLEMSEN MRN: [id number] DOB: [birth year]   Cancelled Treatment:    Reason Eval/Treat Not Completed: Patient declined, no reason specified- per PT  pt declining -- upcoming planned L sided laminotomy and foraminotomies.   Etta NOVAK, OT Acute Rehabilitation Services Office [phone] Secure Chat Preferred    Etta GORMAN Hope 07/01/2024, 9:52 AM

## 2024-07-01 NOTE — TOC Initial Note (Signed)
 Transition of Care [facility]) - Initial/Assessment Note    Patient Details  Name: [name] MRN: [id number] Date of Birth: [birth year]  Transition of Care [facility]) CM/SW Contact:    Andrez JULIANNA George, RN Phone Number: 07/01/2024, 1:23 PM  Clinical Narrative:                  Pt has returned to the hospital due to pain. He is going to have surgery again today.  Lives with spouse. Both drive and both manage his medications at home.  Will need therapy evaluations post surgery.  IP Care Management following.  Expected Discharge Plan:  (TBD) Barriers to Discharge: Continued Medical Work up   Patient Goals and CMS Choice            Expected Discharge Plan and Services       Living arrangements for the past 2 months: Single Family Home                                      Prior Living Arrangements/Services Living arrangements for the past 2 months: Single Family Home Lives with:: Spouse Patient language and need for interpreter reviewed:: Yes Do you feel safe going back to the place where you live?: Yes        Care giver support system in place?: Yes (comment) Current home services: DME (cane/ walker/ shower seat/ WC) Criminal Activity/Legal Involvement Pertinent to Current Situation/Hospitalization: No - Comment as needed  Activities of Daily Living      Permission Sought/Granted                  Emotional Assessment Appearance:: Appears stated age Attitude/Demeanor/Rapport: Engaged Affect (typically observed): Accepting Orientation: : Oriented to Self, Oriented to Place, Oriented to  Time, Oriented to Situation   Psych Involvement: No (comment)  Admission diagnosis:  Post-op pain [G89.18] Acute midline low back pain with bilateral sciatica [M54.42, M54.41] Patient Active Problem List   Diagnosis Date Noted   Post-op pain 06/30/2024   Lumbar stenosis with neurogenic claudication 06/18/2024   Pure hypercholesterolemia 06/20/2023   Degenerative  cervical disc 07/07/2021   Coronary artery disease involving native coronary artery of native heart with unstable angina pectoris (HCC)    CAD (coronary artery disease)    Pseudoarthrosis of lumbar spine 01/06/2020   Coronary artery calcification seen on CT scan 01/29/2019   Change in bowel habits 06/12/2018   Primary osteoarthritis of right knee 01/06/2017   S/P knee replacement 01/06/2017   Diplopia 07/10/2013   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 [place] [address] Phone: [phone] Fax: [phone]     Social Drivers of Health (SDOH) Social History: SDOH Screenings   Food Insecurity: No Food Insecurity (06/30/2024)  Housing: Low Risk  (06/30/2024)  Transportation Needs: No Transportation Needs (06/30/2024)  Utilities: Not At Risk (06/30/2024)  Social Connections: Unknown (06/30/2024)  Tobacco Use: Low Risk  (07/01/2024)   SDOH Interventions:     Readmission Risk Interventions     No data to display

## 2024-07-02 ENCOUNTER — Encounter (HOSPITAL_COMMUNITY): Payer: Self-pay | Admitting: Neurological Surgery

## 2024-07-02 LAB — BASIC METABOLIC PANEL WITH GFR
Anion gap: 10 (ref 5–15)
BUN: 39 mg/dL — ABNORMAL HIGH (ref 8–23)
CO2: 23 mmol/L (ref 22–32)
Calcium: 9.1 mg/dL (ref 8.9–10.3)
Chloride: 102 mmol/L (ref 98–111)
Creatinine, Ser: 1.15 mg/dL (ref 0.61–1.24)
GFR, Estimated: >60 mL/min (ref >60)
Glucose, Bld: 160 mg/dL — ABNORMAL HIGH (ref 70–99)
Potassium: 5.5 mmol/L — ABNORMAL HIGH (ref 3.5–5.1)
Sodium: 135 mmol/L (ref 135–145)

## 2024-07-02 LAB — CBC
HCT: 30.8 % — ABNORMAL LOW (ref 39.0–52.0)
Hemoglobin: 10.3 g/dL — ABNORMAL LOW (ref 13.0–17.0)
MCH: 32.4 pg (ref 26.0–34.0)
MCHC: 33.4 g/dL (ref 30.0–36.0)
MCV: 96.9 fL (ref 80.0–100.0)
Platelets: 157 K/uL (ref 150–400)
RBC: 3.18 MIL/uL — ABNORMAL LOW (ref 4.22–5.81)
RDW: 13.3 % (ref 11.5–15.5)
WBC: 19.7 K/uL — ABNORMAL HIGH (ref 4.0–10.5)
nRBC: 0 % (ref 0.0–0.2)

## 2024-07-02 LAB — GLUCOSE, CAPILLARY: Glucose-Capillary: 164 mg/dL — ABNORMAL HIGH (ref 70–99)

## 2024-07-02 MED ORDER — CEPHALEXIN 500 MG PO CAPS
500.0000 mg | ORAL_CAPSULE | Freq: Three times a day (TID) | ORAL | 0 refills | Status: AC
Start: 2024-07-02 — End: ?

## 2024-07-02 MED ORDER — DEXAMETHASONE 1 MG PO TABS: ORAL_TABLET | ORAL | 0 refills | Status: AC

## 2024-07-02 MED ORDER — PREGABALIN 50 MG PO CAPS
50.0000 mg | ORAL_CAPSULE | Freq: Three times a day (TID) | ORAL | 2 refills | Status: AC
Start: 2024-07-02 — End: 2025-07-02

## 2024-07-02 MED FILL — Thrombin For Soln 5000 Unit: CUTANEOUS | Qty: 5000 | Status: AC

## 2024-07-02 NOTE — TOC Transition Note (Signed)
 Transition of Care [facility]) - Discharge Note   Patient Details  Name: [name] MRN: [id number] Date of Birth: 17-Jun-1946  Transition of Care [facility]) CM/SW Contact:  Andrez JULIANNA George, RN Phone Number: 07/02/2024, 12:46 PM   Clinical Narrative:     Pt is discharging home with self care. No further needs per IP Care Management.   Final next level of care: Home/Self Care Barriers to Discharge: No Barriers Identified   Patient Goals and CMS Choice            Discharge Placement                       Discharge Plan and Services Additional resources added to the After Visit Summary for                                       Social Drivers of Health (SDOH) Interventions SDOH Screenings   Food Insecurity: No Food Insecurity (06/30/2024)  Housing: Low Risk  (06/30/2024)  Transportation Needs: No Transportation Needs (06/30/2024)  Utilities: Not At Risk (06/30/2024)  Social Connections: Unknown (06/30/2024)  Tobacco Use: Low Risk  (07/01/2024)     Readmission Risk Interventions     No data to display

## 2024-07-02 NOTE — Evaluation (Signed)
 Occupational Therapy Evaluation Patient Details Name: [name] MRN: [id number] DOB: Mar 16, 1946 Today's Date: 07/02/2024   History of Present Illness   78 y.o. male presents to [facility] 06/29/24 with worsening post-op pain s/p Antereo-lateral lumbar fusion L1-L3 on 7/1. Pt with B psoas muscle fluid collection likely 2/2 perioperative hemorrhage. Now s/p L L1-3 laminectomy and foraminotomy. PMH of  Cspine fusion, CAD, Dylsipidemia, HTN, Obesity, a flutter, lumbar fusion (2021).     Clinical Impressions PTA patient independent with ADLs, mobility.  Admitted for above and limited by problem list below.  He is able to recall back precautions, but requires up to min cueing functionally to avoid bending during ADL tasks.  He is able to complete ADLs with supervision, transfers and mobility in room with independence.  Pt will have good support from spouse at dc. Educated on back precautions, ADL compensatory techniques, recommendations and activity progression. No further OT needs identified at this time.      If plan is discharge home, recommend the following:   Assistance with cooking/housework;Assist for transportation;A little help with bathing/dressing/bathroom     Functional Status Assessment   Patient has had a recent decline in their functional status and demonstrates the ability to make significant improvements in function in a reasonable and predictable amount of time.     Equipment Recommendations   None recommended by OT     Recommendations for Other Services         Precautions/Restrictions   Precautions Precautions: Back Precaution Booklet Issued: Yes (comment) Recall of Precautions/Restrictions: Intact Precaution/Restrictions Comments: No brace needed per order set Restrictions Weight Bearing Restrictions Per Provider Order: No     Mobility Bed Mobility Overal bed mobility: Modified Independent                  Transfers Overall transfer level:  Independent Equipment used: None                      Balance Overall balance assessment: No apparent balance deficits (not formally assessed)                                         ADL either performed or assessed with clinical judgement   ADL Overall ADL's : Needs assistance/impaired     Grooming: Supervision/safety;Standing;Oral care           Upper Body Dressing : Set up;Sitting   Lower Body Dressing: Supervision/safety;Sit to/from stand   Toilet Transfer: Ambulation;Supervision/safety       Tub/ Shower Transfer: Walk-in shower;Supervision/safety;Ambulation Tub/Shower Transfer Details (indicate cue type and reason): simulated Functional mobility during ADLs: Supervision/safety General ADL Comments: pt requires cueing for upright posture, avoiding bending with ADLs     Vision   Vision Assessment?: No apparent visual deficits     Perception         Praxis         Pertinent Vitals/Pain Pain Assessment Pain Assessment: Faces Faces Pain Scale: Hurts little more Pain Location: Surgical site, L hip Pain Descriptors / Indicators: Operative site guarding Pain Intervention(s): Limited activity within patient's tolerance, Monitored during session, Repositioned     Extremity/Trunk Assessment Upper Extremity Assessment Upper Extremity Assessment: Generalized weakness   Lower Extremity Assessment Lower Extremity Assessment: Defer to PT evaluation   Cervical / Trunk Assessment Cervical / Trunk Assessment: Back Surgery   Communication Communication Communication: No apparent difficulties  Cognition Arousal: Alert Behavior During Therapy: WFL for tasks assessed/performed Cognition: No apparent impairments                               Following commands: Intact       Cueing  General Comments   Cueing Techniques: Verbal cues  cueing for upright posture during ADLs   Exercises     Shoulder Instructions       Home Living Family/patient expects to be discharged to:: Private residence Living Arrangements: Spouse/significant other Available Help at Discharge: Family;Available 24 hours/day Type of Home: House Home Access: Stairs to enter [employer] of Steps: 3 Entrance Stairs-Rails: Right Home Layout: One level     Bathroom Shower/Tub: Producer, television/film/video: Handicapped height (sink on one side, wall on the other)     Home Equipment: Rolling Walker (2 wheels);Grab bars - tub/shower;Cane - single point;Shower seat - built in;Hand held shower head          Prior Functioning/Environment Prior Level of Function : Independent/Modified Independent             Mobility Comments: no AD ADLs Comments: ind    OT Problem List: Pain;Decreased activity tolerance;Decreased knowledge of precautions   OT Treatment/Interventions:        OT Goals(Current goals can be found in the care plan section)   Acute Rehab OT Goals Patient Stated Goal: home OT Goal Formulation: With patient   OT Frequency:       Co-evaluation              AM-PAC OT 6 Clicks Daily Activity     Outcome Measure Help from another person eating meals?: None Help from another person taking care of personal grooming?: A Little Help from another person toileting, which includes using toliet, bedpan, or urinal?: A Little Help from another person bathing (including washing, rinsing, drying)?: A Little Help from another person to put on and taking off regular upper body clothing?: A Little Help from another person to put on and taking off regular lower body clothing?: A Little 6 Click Score: 19   End of Session Nurse Communication: Mobility status  Activity Tolerance: Patient tolerated treatment well Patient left: in bed;with call bell/phone within reach  OT Visit Diagnosis: Other abnormalities of gait and mobility (R26.89);Pain Pain - part of body:  (back incisional)                 Time: 8860-8794 OT Time Calculation (min): 26 min Charges:  OT General Charges $OT Visit: 1 Visit OT Evaluation $OT Eval Low Complexity: 1 Low OT Treatments $Self Care/Home Management : 8-22 mins  Etta NOVAK, OT Acute Rehabilitation Services Office [phone] Secure Chat Preferred    Etta GORMAN Hope 07/02/2024, 12:53 PM

## 2024-07-02 NOTE — Evaluation (Addendum)
 Physical Therapy Brief Evaluation and Discharge Note Patient Details Name: [name] MRN: [id number] DOB: [birth year] Today's Date: 07/02/2024   History of Present Illness  78 y.o. male presents to [facility] 06/29/24 with worsening post-op pain s/p Antereo-lateral lumbar fusion L1-L3 on 7/1. Pt with B psoas muscle fluid collection likely 2/2 perioperative hemorrhage. Now s/p L L1-3 laminectomy and foraminotomy. PMH of  Cspine fusion, CAD, Dylsipidemia, HTN, Obesity, a flutter, lumbar fusion (2021).  Clinical Impression  Patient evaluated by Physical Therapy with no further acute PT needs identified. Pt reports expected post surgical and L hip pain, although reports significant improvement in mobilization. Pt also with continued numbness in bilateral groin and pt reports no awareness of bladder filling or urge to void (MD aware). Pt ambulating 250 ft with no assistive device and negotiated steps without physical assist. Recommended use of cane for unlevel surface negotiation. All education has been completed and the patient has no further questions. No follow-up Physical Therapy or equipment needs. PT is signing off. Thank you for this referral.        PT Assessment Patient does not need any further PT services  Assistance Needed at Discharge  PRN    Equipment Recommendations None recommended by PT  Recommendations for Other Services       Precautions/Restrictions Precautions Precautions: Back Precaution Booklet Issued: Yes (comment) Recall of Precautions/Restrictions: Intact Precaution/Restrictions Comments: No brace needed per order set Restrictions Weight Bearing Restrictions Per Provider Order: No        Mobility  Bed Mobility   Supine/Sidelying to sit: Modified independent (Device/Increased time)      Transfers Overall transfer level: Independent Equipment used: None                    Ambulation/Gait Ambulation/Gait assistance: Supervision Gait Distance  (Feet): 250 Feet Assistive device: None Gait Pattern/deviations: Step-through pattern, Decreased stride length   General Gait Details: Mild dynamic unsteadiness  Home Activity Instructions    Stairs Stairs: Yes Stairs assistance: Modified independent (Device/Increase time) Stair Management: One rail Right Number of Stairs: 2    Modified Rankin (Stroke Patients Only)        Balance Overall balance assessment: Mild deficits observed, not formally tested                        Pertinent Vitals/Pain PT - Brief Vital Signs All Vital Signs Stable: Yes Pain Assessment Pain Assessment: Faces Faces Pain Scale: Hurts little more Pain Location: Surgical site, L hip Pain Descriptors / Indicators: Operative site guarding Pain Intervention(s): Monitored during session     Home Living Family/patient expects to be discharged to:: Private residence Living Arrangements: Spouse/significant other Available Help at Discharge: Family Home Environment: Stairs to enter  [name]: 2 Home Equipment: Agricultural consultant (2 wheels);Grab bars - tub/shower;Shower seat;Cane - single point        Prior Function Level of Independence: Independent      UE/LE Assessment   UE ROM/Strength/Tone/Coordination: WFL    LE ROM/Strength/Tone/Coordination: [facility]      Communication   Communication Communication: No apparent difficulties     Cognition Overall Cognitive Status: Appears within functional limits for tasks assessed/performed       General Comments      Exercises Other Exercises Other Exercises: Sitting: scapular retractions x 5   Assessment/Plan    PT Problem List Decreased strength;Decreased activity tolerance;Decreased balance;Decreased mobility;Decreased safety awareness;Decreased knowledge of use of DME;Pain  PT Visit Diagnosis Unsteadiness on feet (R26.81);Pain    No Skilled PT All education completed;Patient is supervision for all  activity/mobility   Co-evaluation                AMPAC 6 Clicks Help needed turning from your back to your side while in a flat bed without using bedrails?: None Help needed moving from lying on your back to sitting on the side of a flat bed without using bedrails?: None Help needed moving to and from a bed to a chair (including a wheelchair)?: None Help needed standing up from a chair using your arms (e.g., wheelchair or bedside chair)?: None Help needed to walk in hospital room?: A Little Help needed climbing 3-5 steps with a railing? : None 6 Click Score: 23      End of Session   Activity Tolerance: Patient tolerated treatment well Patient left: in chair;with call bell/phone within reach Nurse Communication: Mobility status PT Visit Diagnosis: Unsteadiness on feet (R26.81);Pain Pain - Right/Left: Left Pain - part of body: Hip     Time: 9185-9168 PT Time Calculation (min) (ACUTE ONLY): 17 min  Charges:   PT Evaluation $PT Eval Low Complexity: 1 Low      Aleck Daring, PT, DPT Acute Rehabilitation Services Office [phone]   [name]  07/02/2024, 10:38 AM

## 2024-07-02 NOTE — Progress Notes (Signed)
  Progress Note   Date: 07/02/2024  Patient Name: [name]        MRN#: [id number]  Review the patient's clinical findings supports the diagnosis of: Obesity class I.  Patient's BMI is 31.3.  Patient is doing well postoperative day 1.  He has less leg pain in the region of the hip and left anterior thigh.  He was able to walk the length of the whole hallway whereas before the first 5 steps with increased pain substantially.  He is quite pleased with his progress.  His incision is clean and dry.  Obesity class I

## 2024-07-02 NOTE — Discharge Summary (Signed)
 Physician Discharge Summary  Patient ID: [name] MRN: [id number] DOB/AGE: 08-30-1946 78 y.o.  Admit date: 06/29/2024 Discharge date: 07/02/2024  Admission Diagnoses:Lumbar Radiculopathy  Discharge Diagnoses: Lumbar Radiculopathy L1-2 and L2-3 Principal Problem:   Post-op pain   Discharged Condition: good  Hospital Course: Patient tolerated surgery well.  Consults: None  Significant Diagnostic Studies: none  Treatments: surgery: see op note  Discharge Exam: Blood pressure 134/62, pulse (!) 59, temperature 98.4 F (36.9 C), temperature source Oral, resp. rate 18, height 5' 11 (1.803 m), weight 103 kg, SpO2 97%. Incision clean and dry.  Disposition: Discharge disposition: 01-Home or Self Care       Discharge Instructions     Call MD for:  redness, tenderness, or signs of infection (pain, swelling, redness, odor or green/yellow discharge around incision site)   Complete by: As directed    Call MD for:  severe uncontrolled pain   Complete by: As directed    Call MD for:  temperature >100.4   Complete by: As directed    Diet - low sodium heart healthy   Complete by: As directed    Discharge wound care:   Complete by: As directed    Okay to shower. Do not apply salves or appointments to incision. No heavy lifting with the upper extremities greater than 10 pounds. May resume driving when not requiring pain medication and patient feels comfortable with doing so.   Incentive spirometry RT   Complete by: As directed    Increase activity slowly   Complete by: As directed       Allergies as of 07/02/2024       Reactions   Alfuzosin Other (See Comments), Swelling   Felt winded/faint   Duloxetine  Hcl Nausea And Vomiting, Swelling   Dizziness   Gabapentin Nausea And Vomiting, Nausea Only   dizzy   Pregabalin  Itching, Nausea And Vomiting, Nausea Only, Other (See Comments)   Felt poorly, dizzy   Tamsulosin Hcl Other (See Comments), Nausea Only, Swelling    Orthostatic hypotension   Contrast Media [iodinated Contrast Media] Rash   Cath dye 06/18/21 rash started next day   Niacin Diarrhea        Medication List     TAKE these medications    amLODipine  5 MG tablet Commonly known as: NORVASC  TAKE 1 TABLET BY MOUTH TWICE DAILY   benazepril  20 MG tablet Commonly known as: LOTENSIN  TAKE 1 TABLET BY MOUTH TWICE DAILY   celecoxib  200 MG capsule Commonly known as: CeleBREX  Take 1 capsule (200 mg total) by mouth daily as needed.   cephALEXin  500 MG capsule Commonly known as: KEFLEX  Take 1 capsule (500 mg total) by mouth 3 (three) times daily.   cyanocobalamin  1000 MCG/ML injection Commonly known as: VITAMIN B12 Inject 1,000 mcg into the muscle every 30 (thirty) days.   dexamethasone  1 MG tablet Commonly known as: DECADRON  2 tablets twice daily for 2 days, one tablet twice daily for 2 days, one tablet daily for 2 days. What changed: Another medication with the same name was added. Make sure you understand how and when to take each.   dexamethasone  1 MG tablet Commonly known as: DECADRON  2 tablets twice daily for 2 days, one tablet twice daily for 2 days, one tablet daily for 2 days. What changed: You were already taking a medication with the same name, and this prescription was added. Make sure you understand how and when to take each.   fluocinonide 0.05 % external solution Commonly  known as: LIDEX Apply 1 Application topically 2 (two) times daily as needed (neck rash/irritation.).   hydrochlorothiazide  12.5 MG capsule Commonly known as: MICROZIDE  Take 12.5 mg by mouth daily with lunch.   ipratropium 0.06 % nasal spray Commonly known as: ATROVENT Place 2 sprays into both nostrils daily as needed for rhinitis.   methocarbamol  500 MG tablet Commonly known as: ROBAXIN  Take 500 mg by mouth every 8 (eight) hours as needed for muscle spasms.   multivitamin with minerals Tabs tablet Take 1 tablet by mouth every evening. Adult  50+   nitroGLYCERIN  0.4 MG SL tablet Commonly known as: Nitrostat  Place 1 tablet (0.4 mg total) under the tongue every 5 (five) minutes as needed.   oxyCODONE  5 MG immediate release tablet Commonly known as: Oxy IR/ROXICODONE  Take 1 tablet (5 mg total) by mouth every 6 (six) hours as needed.   pregabalin  50 MG capsule Commonly known as: Lyrica  Take 1 capsule (50 mg total) by mouth 3 (three) times daily. What changed:  medication strength how much to take when to take this   rosuvastatin  10 MG tablet Commonly known as: CRESTOR  TAKE 1 TABLET BY MOUTH DAILY What changed: when to take this   tadalafil  5 MG tablet Commonly known as: CIALIS  Take 1 tablet (5 mg total) by mouth daily. What changed:  when to take this reasons to take this   Vitamin D3 50 MCG (2000 UT) Tabs Take 2,000 Units by mouth daily with lunch.               Discharge Care Instructions  (From admission, onward)           Start     Ordered   07/02/24 0000  Discharge wound care:       Comments: Okay to shower. Do not apply salves or appointments to incision. No heavy lifting with the upper extremities greater than 10 pounds. May resume driving when not requiring pain medication and patient feels comfortable with doing so.   07/02/24 1235             Signed: Victory PARAS Delorese Sellin 07/02/2024, 12:35 PM

## 2024-07-08 ENCOUNTER — Other Ambulatory Visit: Payer: Self-pay | Admitting: Neurological Surgery

## 2024-07-08 DIAGNOSIS — M48061 Spinal stenosis, lumbar region without neurogenic claudication: Secondary | ICD-10-CM

## 2024-07-17 DIAGNOSIS — I959 Hypotension, unspecified: Secondary | ICD-10-CM | POA: Diagnosis not present

## 2024-07-17 DIAGNOSIS — R5383 Other fatigue: Secondary | ICD-10-CM | POA: Diagnosis not present

## 2024-07-17 DIAGNOSIS — R600 Localized edema: Secondary | ICD-10-CM | POA: Diagnosis not present

## 2024-07-22 ENCOUNTER — Ambulatory Visit (HOSPITAL_COMMUNITY)
Admission: RE | Admit: 2024-07-22 | Discharge: 2024-07-22 | Disposition: A | Discharge status: Home / Self Care | Source: Ambulatory Visit | Attending: Surgery | Admitting: Surgery

## 2024-07-22 ENCOUNTER — Other Ambulatory Visit (HOSPITAL_COMMUNITY): Payer: Self-pay | Admitting: Neurological Surgery

## 2024-07-22 DIAGNOSIS — R600 Localized edema: Secondary | ICD-10-CM

## 2024-07-23 ENCOUNTER — Emergency Department (HOSPITAL_COMMUNITY)

## 2024-07-23 ENCOUNTER — Encounter (HOSPITAL_COMMUNITY): Payer: Self-pay

## 2024-07-23 ENCOUNTER — Emergency Department (HOSPITAL_COMMUNITY)
Admission: EM | Admit: 2024-07-23 | Discharge: 2024-07-23 | Disposition: A | Discharge status: Home / Self Care | Attending: Emergency Medicine | Admitting: Emergency Medicine

## 2024-07-23 ENCOUNTER — Other Ambulatory Visit: Payer: Self-pay

## 2024-07-23 DIAGNOSIS — Z79899 Other long term (current) drug therapy: Secondary | ICD-10-CM | POA: Diagnosis not present

## 2024-07-23 DIAGNOSIS — R600 Localized edema: Secondary | ICD-10-CM | POA: Insufficient documentation

## 2024-07-23 DIAGNOSIS — I10 Essential (primary) hypertension: Secondary | ICD-10-CM | POA: Diagnosis not present

## 2024-07-23 DIAGNOSIS — N281 Cyst of kidney, acquired: Secondary | ICD-10-CM | POA: Diagnosis not present

## 2024-07-23 DIAGNOSIS — I2510 Atherosclerotic heart disease of native coronary artery without angina pectoris: Secondary | ICD-10-CM | POA: Diagnosis not present

## 2024-07-23 DIAGNOSIS — R0602 Shortness of breath: Secondary | ICD-10-CM

## 2024-07-23 DIAGNOSIS — R918 Other nonspecific abnormal finding of lung field: Secondary | ICD-10-CM

## 2024-07-23 DIAGNOSIS — R911 Solitary pulmonary nodule: Secondary | ICD-10-CM | POA: Insufficient documentation

## 2024-07-23 DIAGNOSIS — J9811 Atelectasis: Secondary | ICD-10-CM | POA: Diagnosis not present

## 2024-07-23 LAB — CBC
HCT: 32.5 % — ABNORMAL LOW (ref 39.0–52.0)
Hemoglobin: 10.5 g/dL — ABNORMAL LOW (ref 13.0–17.0)
MCH: 32.7 pg (ref 26.0–34.0)
MCHC: 32.3 g/dL (ref 30.0–36.0)
MCV: 101.2 fL — ABNORMAL HIGH (ref 80.0–100.0)
Platelets: 173 K/uL (ref 150–400)
RBC: 3.21 MIL/uL — ABNORMAL LOW (ref 4.22–5.81)
RDW: 14.3 % (ref 11.5–15.5)
WBC: 5.3 K/uL (ref 4.0–10.5)
nRBC: 0 % (ref 0.0–0.2)

## 2024-07-23 LAB — TROPONIN I (HIGH SENSITIVITY)
Troponin I (High Sensitivity): 13 ng/L (ref <18)
Troponin I (High Sensitivity): 13 ng/L (ref <18)

## 2024-07-23 LAB — BASIC METABOLIC PANEL WITH GFR
Anion gap: 8 (ref 5–15)
BUN: 34 mg/dL — ABNORMAL HIGH (ref 8–23)
CO2: 25 mmol/L (ref 22–32)
Calcium: 9.5 mg/dL (ref 8.9–10.3)
Chloride: 104 mmol/L (ref 98–111)
Creatinine, Ser: 1.07 mg/dL (ref 0.61–1.24)
GFR, Estimated: >60 mL/min (ref >60)
Glucose, Bld: 104 mg/dL — ABNORMAL HIGH (ref 70–99)
Potassium: 4.2 mmol/L (ref 3.5–5.1)
Sodium: 137 mmol/L (ref 135–145)

## 2024-07-23 LAB — BRAIN NATRIURETIC PEPTIDE: B Natriuretic Peptide: 71.6 pg/mL (ref 0.0–100.0)

## 2024-07-23 LAB — D-DIMER, QUANTITATIVE: D-Dimer, Quant: 3.98 ug{FEU}/mL — ABNORMAL HIGH (ref 0.00–0.50)

## 2024-07-23 MED ORDER — DIPHENHYDRAMINE HCL 50 MG/ML IJ SOLN
50.0000 mg | Freq: Once | INTRAMUSCULAR | Status: AC
  Administered 2024-07-23: 50 mg via INTRAVENOUS
  Filled 2024-07-23: qty 1

## 2024-07-23 MED ORDER — DIPHENHYDRAMINE HCL 25 MG PO CAPS: 50.0000 mg | ORAL_CAPSULE | Freq: Once | ORAL | Status: AC

## 2024-07-23 MED ORDER — IOHEXOL 350 MG/ML SOLN
75.0000 mL | Freq: Once | INTRAVENOUS | Status: AC | PRN
  Administered 2024-07-23: 75 mL via INTRAVENOUS

## 2024-07-23 MED ORDER — METHYLPREDNISOLONE SODIUM SUCC 40 MG IJ SOLR
40.0000 mg | Freq: Once | INTRAMUSCULAR | Status: AC
  Administered 2024-07-23: 40 mg via INTRAVENOUS
  Filled 2024-07-23: qty 1

## 2024-07-23 NOTE — ED Provider Notes (Signed)
 [place] EMERGENCY DEPARTMENT AT [facility] Provider Note   CSN: [id number] Arrival date & time: 07/23/24  1013     Patient presents with: Shortness of Breath   [name] is a 78 y.o. male.   Patient is a 78 year old male who presents with shortness of breath.  He had a history of L1/L2 lumbar decompression surgery July 1.  He had to go back and have a revision on July 14.  He says he has had some limited mobility since then.  He has had some bilateral leg swelling that started when he was in the hospital after his second surgery.  He says the swelling has since gone down.  He has been short of breath since that second surgery.  He says he gets winded with short amounts of walking.  Denies any associated chest discomfort.  He denies any leg pain.  No fevers.  No cough or cold symptoms.  He says his back is slowly improving.  He had an ultrasound of his lower extremities that was positive for DVT and was started on Eliquis yesterday.  He was advised to come to the ED for further evaluation of possible pulmonary emboli.       Prior to Admission medications   Medication Sig Start Date End Date Taking? Authorizing Provider  amLODipine  (NORVASC ) 5 MG tablet TAKE 1 TABLET BY MOUTH TWICE DAILY 01/25/24   Okey Vina GAILS, MD  benazepril  (LOTENSIN ) 20 MG tablet TAKE 1 TABLET BY MOUTH TWICE DAILY 01/25/24   Okey Vina GAILS, MD  celecoxib  (CELEBREX ) 200 MG capsule Take 1 capsule (200 mg total) by mouth daily as needed. 06/20/23     cephALEXin  (KEFLEX ) 500 MG capsule Take 1 capsule (500 mg total) by mouth 3 (three) times daily. 07/02/24   Colon Shove, MD  Cholecalciferol  (VITAMIN D3) 50 MCG (2000 UT) TABS Take 2,000 Units by mouth daily with lunch.    [provider]  cyanocobalamin  (VITAMIN B12) 1000 MCG/ML injection Inject 1,000 mcg into the muscle every 30 (thirty) days.    [provider]  dexamethasone  (DECADRON ) 1 MG tablet 2 tablets twice daily for 2 days, one tablet  twice daily for 2 days, one tablet daily for 2 days. Patient not taking: Reported on 06/30/2024 06/20/24   Colon Shove, MD  dexamethasone  (DECADRON ) 1 MG tablet 2 tablets twice daily for 2 days, one tablet twice daily for 2 days, one tablet daily for 2 days. 07/02/24   Colon Shove, MD  fluocinonide (LIDEX) 0.05 % external solution Apply 1 Application topically 2 (two) times daily as needed (neck rash/irritation.). 01/10/24   [provider]  hydrochlorothiazide  (,MICROZIDE /HYDRODIURIL ,) 12.5 MG capsule Take 12.5 mg by mouth daily with lunch.     [provider]  ipratropium (ATROVENT) 0.06 % nasal spray Place 2 sprays into both nostrils daily as needed for rhinitis.    [provider]  methocarbamol  (ROBAXIN ) 500 MG tablet Take 500 mg by mouth every 8 (eight) hours as needed for muscle spasms.    [provider]  Multiple Vitamin (MULTIVITAMIN WITH MINERALS) TABS tablet Take 1 tablet by mouth every evening. Adult 50+    [provider]  nitroGLYCERIN  (NITROSTAT ) 0.4 MG SL tablet Place 1 tablet (0.4 mg total) under the tongue every 5 (five) minutes as needed. 07/01/21   Henry Manuelita NOVAK, NP  oxyCODONE  (OXY IR/ROXICODONE ) 5 MG immediate release tablet Take 1 tablet (5 mg total) by mouth every 6 (six) hours as needed.  02/17/24     pregabalin  (LYRICA ) 50 MG capsule Take 1 capsule (50 mg total) by mouth 3 (three) times daily. 07/02/24 07/02/25  Colon Shove, MD  rosuvastatin  (CRESTOR ) 10 MG tablet TAKE 1 TABLET BY MOUTH DAILY Patient taking differently: Take 10 mg by mouth at bedtime. 02/09/24   Okey Vina GAILS, MD  tadalafil  (CIALIS ) 5 MG tablet Take 1 tablet (5 mg total) by mouth daily. Patient taking differently: Take 5 mg by mouth daily as needed for erectile dysfunction. 02/09/24   McKenzie, Belvie CROME, MD    Allergies: Alfuzosin, Duloxetine  hcl, Gabapentin, Pregabalin , Tamsulosin hcl, Contrast media [iodinated contrast media], and Niacin    Review of Systems   Constitutional:  Positive for fatigue. Negative for chills, diaphoresis and fever.  HENT:  Negative for congestion, rhinorrhea and sneezing.   Eyes: Negative.   Respiratory:  Positive for shortness of breath. Negative for cough and chest tightness.   Cardiovascular:  Positive for leg swelling. Negative for chest pain.  Gastrointestinal:  Negative for abdominal pain, diarrhea, nausea and vomiting.  Genitourinary:  Negative for difficulty urinating, flank pain and frequency.  Musculoskeletal:  Positive for back pain. Negative for arthralgias.  Skin:  Negative for rash.  Neurological:  Negative for dizziness, speech difficulty, weakness, numbness and headaches.    Updated Vital Signs BP 133/60   Pulse (!) 56   Temp 98 F (36.7 C) (Oral)   Resp 11   Ht 5' 11 (1.803 m)   Wt 106.6 kg   SpO2 96%   BMI 32.78 kg/m   Physical Exam Constitutional:      Appearance: He is well-developed.  HENT:     Head: Normocephalic and atraumatic.  Eyes:     Pupils: Pupils are equal, round, and reactive to light.  Cardiovascular:     Rate and Rhythm: Normal rate and regular rhythm.     Heart sounds: Normal heart sounds.  Pulmonary:     Effort: Pulmonary effort is normal. No respiratory distress.     Breath sounds: Normal breath sounds. No wheezing or rales.  Chest:     Chest wall: No tenderness.  Abdominal:     General: Bowel sounds are normal.     Palpations: Abdomen is soft.     Tenderness: There is no abdominal tenderness. There is no guarding or rebound.  Musculoskeletal:        General: Normal range of motion.     Cervical back: Normal range of motion and neck supple.     Comments: 2+ pitting edema to lower extremities bilaterally, no warmth or erythema  Lymphadenopathy:     Cervical: No cervical adenopathy.  Skin:    General: Skin is warm and dry.     Findings: No rash.  Neurological:     Mental Status: He is alert and oriented to person, place, and time.     (all labs ordered  are listed, but only abnormal results are displayed) Labs Reviewed  BASIC METABOLIC PANEL WITH GFR - Abnormal; Notable for the following components:      Result Value   Glucose, Bld 104 (*)    BUN 34 (*)    All other components within normal limits  CBC - Abnormal; Notable for the following components:   RBC 3.21 (*)    Hemoglobin 10.5 (*)    HCT 32.5 (*)    MCV 101.2 (*)    All other components within normal limits  D-DIMER, QUANTITATIVE (NOT AT [facility]) - Abnormal; Notable for the  following components:   D-Dimer, Quant 3.98 (*)    All other components within normal limits  BRAIN NATRIURETIC PEPTIDE  TROPONIN I (HIGH SENSITIVITY)  TROPONIN I (HIGH SENSITIVITY)    EKG: EKG Interpretation Date/Time:  Tuesday July 23 2024 10:45:33 EDT Ventricular Rate:  71 PR Interval:  190 QRS Duration:  86 QT Interval:  408 QTC Calculation: 443 R Axis:   50  Text Interpretation: Sinus rhythm with Premature supraventricular complexes Nonspecific ST abnormality Abnormal ECG When compared with ECG of 11-Jun-2024 10:40, PREVIOUS ECG IS PRESENT since last tracing no significant change Confirmed by Lenor Hollering [phone]) on 07/23/2024 11:03:31 AM  Radiology: CT Angio Chest PE W and/or Wo Contrast Result Date: 07/23/2024 CLINICAL DATA:  Chest radiograph July 23, 2024 EXAM: CT ANGIOGRAPHY CHEST WITH CONTRAST TECHNIQUE: Multidetector CT imaging of the chest was performed using the standard protocol during bolus administration of intravenous contrast. Multiplanar CT image reconstructions and MIPs were obtained to evaluate the vascular anatomy. RADIATION DOSE REDUCTION: This exam was performed according to the departmental dose-optimization program which includes automated exposure control, adjustment of the mA and/or kV according to patient size and/or use of iterative reconstruction technique. CONTRAST:  75mL OMNIPAQUE  IOHEXOL  350 MG/ML SOLN COMPARISON:  Chest radiograph July 23, 2024 FINDINGS:  Cardiovascular: Satisfactory opacification of the pulmonary arteries to the segmental level. No evidence of pulmonary embolism. Atherosclerotic calcifications of coronary arteries. Normal heart size. No pericardial effusion. Mediastinum/Nodes: No enlarged mediastinal, hilar, or axillary lymph nodes. Thyroid  gland, trachea, demonstrate no significant findings. Patulous esophagus. Lungs/Pleura: Right upper lobe subpleural nodule measuring 6 mm (8/87 left lower lobe pulmonary nodule measuring 6 mm (8/83. Subsegmental atelectasis/consolidation involving the lingula. Diffuse bronchial and bronchiolar wall thickening. Upper Abdomen: Left kidney upper pole cortical cysts which does not require imaging follow-up. Musculoskeletal: No suspicious osseous lesion. Posterior spinal fusion device in the lumbar region partially visualized. Multilevel degenerative changes of the spine. Review of the MIP images confirms the above findings. IMPRESSION: No suspicious finding to suggest pulmonary embolism. Subsegmental atelectasis/consolidation of lingula. Scattered pulmonary nodules up to 6 mm. No follow-up needed if patient is low-risk (and has no known or suspected primary neoplasm). Non-contrast chest CT can be considered in 12 months if patient is high-risk. This recommendation follows the consensus statement: Guidelines for Management of Incidental Pulmonary Nodules Detected on CT Images: From the Fleischner Society 2017; Radiology 2017; 284:228-243. Atherosclerotic calcifications of aorta and coronary arteries. Electronically Signed   By: Megan  Zare M.D.   On: 07/23/2024 16:17   DG Chest 2 View Result Date: 07/23/2024 CLINICAL DATA:  Shortness of breath EXAM: CHEST - 2 VIEW COMPARISON:  None Available. FINDINGS: The heart mediastinum are normal. No acute infiltrate. No pleural effusion. IMPRESSION: No acute disease Electronically Signed   By: Nancyann Burns M.D.   On: 07/23/2024 11:51   VAS US  LOWER EXTREMITY VENOUS  (DVT) Result Date: 07/23/2024  Lower Venous DVT Study Patient Name:  [name]  Date of Exam:   07/22/2024 Medical Rec #: [id number]        Accession #:    [id number] Date of Birth: 06-29-1946        Patient Gender: M Patient Age:   57 years Exam Location:  [address] Procedure:      VAS US  LOWER EXTREMITY VENOUS (DVT) Referring Phys: VICTORY GENS --------------------------------------------------------------------------------  Indications: Edema.  Comparison Study: N/A Performing Technologist: Dena Pane  Examination Guidelines: A complete evaluation includes B-mode imaging, spectral Doppler, color Doppler, and power  Doppler as needed of all accessible portions of each vessel. Bilateral testing is considered an integral part of a complete examination. Limited examinations for reoccurring indications may be performed as noted. The reflux portion of the exam is performed with the patient in reverse Trendelenburg.  +---------+---------------+---------+-----------+----------+--------------+ RIGHT    CompressibilityPhasicitySpontaneityPropertiesThrombus Aging +---------+---------------+---------+-----------+----------+--------------+ CFV      Full           Yes      Yes                                 +---------+---------------+---------+-----------+----------+--------------+ SFJ      Full                                                        +---------+---------------+---------+-----------+----------+--------------+ FV Prox  Full           Yes      Yes                                 +---------+---------------+---------+-----------+----------+--------------+ FV Mid   Full                                                        +---------+---------------+---------+-----------+----------+--------------+ FV DistalFull                                                        +---------+---------------+---------+-----------+----------+--------------+ PFV      Full                                                         +---------+---------------+---------+-----------+----------+--------------+ POP      Full           Yes      Yes                                 +---------+---------------+---------+-----------+----------+--------------+ PTV      Full                                                        +---------+---------------+---------+-----------+----------+--------------+ PERO     Full                                                        +---------+---------------+---------+-----------+----------+--------------+ GSV      Full                                                        +---------+---------------+---------+-----------+----------+--------------+   +---------+---------------+---------+-----------+----------+-----------------+  LEFT     CompressibilityPhasicitySpontaneityPropertiesThrombus Aging    +---------+---------------+---------+-----------+----------+-----------------+ CFV      Full           Yes      Yes                                    +---------+---------------+---------+-----------+----------+-----------------+ SFJ      Full                                                           +---------+---------------+---------+-----------+----------+-----------------+ FV Prox  Full           Yes      Yes                                    +---------+---------------+---------+-----------+----------+-----------------+ FV Mid   Full                                                           +---------+---------------+---------+-----------+----------+-----------------+ FV DistalFull                                                           +---------+---------------+---------+-----------+----------+-----------------+ PFV      Full                                                           +---------+---------------+---------+-----------+----------+-----------------+ POP      Full            Yes      Yes                                    +---------+---------------+---------+-----------+----------+-----------------+ PTV      None                                         Acute             +---------+---------------+---------+-----------+----------+-----------------+ PERO     None                                         Age Indeterminate +---------+---------------+---------+-----------+----------+-----------------+ GSV      Full                                                           +---------+---------------+---------+-----------+----------+-----------------+  Summary: RIGHT: - No evidence of deep vein thrombosis in the lower extremity. No indirect evidence of obstruction proximal to the inguinal ligament. - No evidence of common femoral vein obstruction.  There is no cystic structure in the popliteal fossa. All other veins appear patent. Left: - No evidence of common femoral vein obstruction. - There is evidence of DVT in the PTVs and Pero veins. - A cystic structure is found in the posterior knee. No evidence of DVT from the thigh to knee.  *See table(s) above for measurements and observations. Electronically signed by Norman Serve on 07/23/2024 at 9:22:01 AM.    Final      Procedures   Medications Ordered in the ED  methylPREDNISolone  sodium succinate (SOLU-MEDROL ) 40 mg/mL injection 40 mg (40 mg Intravenous Given 07/23/24 1139)  diphenhydrAMINE  (BENADRYL ) capsule 50 mg ( Oral See Alternative 07/23/24 1503)    Or  diphenhydrAMINE  (BENADRYL ) injection 50 mg (50 mg Intravenous Given 07/23/24 1503)  iohexol  (OMNIPAQUE ) 350 MG/ML injection 75 mL (75 mLs Intravenous Contrast Given 07/23/24 1546)                                    Medical Decision Making Amount and/or Complexity of Data Reviewed Labs: ordered. Radiology: ordered.  Risk Prescription drug management.   This patient presents to the ED for concern of shortness of breath, this involves an extensive  number of treatment options, and is a complaint that carries with it a high risk of complications and morbidity.  I considered the following differential and admission for this acute, potentially life threatening condition.  The differential diagnosis includes CHF, PE, ACS, anemia, electrolyte abnormality, infection  MDM:    Patient is a 78 year old with recent back surgery who was recently diagnosed with DVT presents with some shortness of breath.  His doctor was concerned about possible PE.  Chest x-ray does not show any evidence of pneumonia or pneumothorax.  Labs are nonconcerning.  Troponins are normal.  BNP is normal.  No other signs of fluid overload.  CT chest was performed.  He had to be premedicated due to a contrast allergy.  This showed no evidence of PE.  There are some pulmonary nodules that were discussed with him.  He can follow-up with his PCP regarding this.  He has no hypoxia.  No increased work of breathing.  He was discharged home in good condition.  Was encouraged to follow-up with his PCP.  He also has an appointment Dr. Colon tomorrow.  Return precautions were given.  (Labs, imaging, consults)  Labs: I Ordered, and personally interpreted labs.  The pertinent results include: Normal troponins, no significant anemia, hemoglobin is similar to prior values.  Imaging Studies ordered: I ordered imaging studies including chest x-ray, CT chest I independently visualized and interpreted imaging. I agree with the radiologist interpretation  Additional history obtained from wife.  External records from outside source obtained and reviewed including history  Cardiac Monitoring: The patient was maintained on a cardiac monitor.  If on the cardiac monitor, I personally viewed and interpreted the cardiac monitored which showed an underlying rhythm of: Sinus rhythm  Reevaluation: After the interventions noted above, I reevaluated the patient and found that they have :stayed the  same  Social Determinants of Health:    Disposition: Discharged to home  Co morbidities that complicate the patient evaluation  Past Medical History:  Diagnosis Date   Arthritis  Complication of anesthesia    FUSion of SKULL thru C7- patient has no movement of head or neck - stiffness from fusion   Coronary artery disease    Diplopia 07/10/2013   Diverticulosis of colon (without mention of hemorrhage) 2009   Colonoscopy   Dyslipidemia    Dysrhythmia    PAF- not frequently   HTN (hypertension)    Hx of colonic polyps 2009   Colonoscopy(Hyperplastic)   Obesity    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  Ordered Dose: 1 to 125 mg TDD; convert to: TDD q24h.  Ordered Dose: 126 to 250 mg TDD; convert to: TDD div q12h.  Ordered Dose: >250 mg TDD; DAW.   OR Linked Order Group    diphenhydrAMINE  (BENADRYL ) capsule 50 mg    diphenhydrAMINE  (BENADRYL ) injection 50 mg   iohexol  (OMNIPAQUE ) 350 MG/ML injection 75 mL    I have reviewed the patients home medicines and have made adjustments as needed  Problem List / ED Course: Problem List Items Addressed This Visit   None Visit Diagnoses       Shortness of breath    -  Primary     Pulmonary nodules                    Final diagnoses:  Shortness of breath    ED Discharge Orders     None          Lenor Hollering, MD 07/23/24 1648

## 2024-07-23 NOTE — Discharge Instructions (Addendum)
 Follow-up with Dr. Colon tomorrow as scheduled.  Follow-up with your primary care doctor if your shortness of breath continues.  Return to the emergency room if you have any worsening symptoms.  You did have some small nodules in your lungs.  These can be followed up by your primary care doctor.

## 2024-07-23 NOTE — ED Notes (Signed)
 Patient transported to CT

## 2024-07-23 NOTE — ED Triage Notes (Signed)
 Pt was diagnosed w/DVT in bilateral lower legs yesterday. Pt states he has had increasing shortness of breath for past few weeks and was told to come to ED for evaluation for blood clots in lungs.

## 2024-07-24 DIAGNOSIS — M48061 Spinal stenosis, lumbar region without neurogenic claudication: Secondary | ICD-10-CM | POA: Diagnosis not present

## 2024-07-26 DIAGNOSIS — R531 Weakness: Secondary | ICD-10-CM | POA: Diagnosis not present

## 2024-07-26 DIAGNOSIS — I82402 Acute embolism and thrombosis of unspecified deep veins of left lower extremity: Secondary | ICD-10-CM | POA: Diagnosis not present

## 2024-07-26 DIAGNOSIS — D649 Anemia, unspecified: Secondary | ICD-10-CM | POA: Diagnosis not present

## 2024-08-07 DIAGNOSIS — R200 Anesthesia of skin: Secondary | ICD-10-CM | POA: Diagnosis not present

## 2024-08-07 DIAGNOSIS — M4319 Spondylolisthesis, multiple sites in spine: Secondary | ICD-10-CM | POA: Diagnosis not present

## 2024-08-07 DIAGNOSIS — M549 Dorsalgia, unspecified: Secondary | ICD-10-CM | POA: Diagnosis not present

## 2024-08-07 DIAGNOSIS — M4322 Fusion of spine, cervical region: Secondary | ICD-10-CM | POA: Diagnosis not present

## 2024-08-09 ENCOUNTER — Other Ambulatory Visit: Payer: Self-pay | Admitting: Internal Medicine

## 2024-08-12 ENCOUNTER — Encounter (HOSPITAL_BASED_OUTPATIENT_CLINIC_OR_DEPARTMENT_OTHER): Payer: Self-pay | Admitting: Physical Therapy

## 2024-08-12 ENCOUNTER — Ambulatory Visit (HOSPITAL_BASED_OUTPATIENT_CLINIC_OR_DEPARTMENT_OTHER): Attending: Family Medicine | Admitting: Physical Therapy

## 2024-08-12 ENCOUNTER — Other Ambulatory Visit: Payer: Self-pay

## 2024-08-12 DIAGNOSIS — R2689 Other abnormalities of gait and mobility: Secondary | ICD-10-CM | POA: Diagnosis not present

## 2024-08-12 DIAGNOSIS — R293 Abnormal posture: Secondary | ICD-10-CM | POA: Insufficient documentation

## 2024-08-12 DIAGNOSIS — M5459 Other low back pain: Secondary | ICD-10-CM | POA: Insufficient documentation

## 2024-08-12 NOTE — Therapy (Unsigned)
 OUTPATIENT PHYSICAL THERAPY THORACOLUMBAR EVALUATION   Patient Name: [name] MRN: [id number] DOB:1946-03-10, 78 y.o., male Today's Date: 08/13/2024  END OF SESSION:  PT End of Session - 08/12/24 1341     Visit Number 1    Number of Visits 24    Date for PT Re-Evaluation 11/04/24    Authorization Type 24    PT Start Time 0930    PT Stop Time 1015    PT Time Calculation (min) 45 min    Activity Tolerance Patient tolerated treatment well    Behavior During Therapy WFL for tasks assessed/performed          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   Paroxysmal atrial flutter (HCC)    Past Surgical History:  Procedure Laterality Date   ABDOMINAL EXPOSURE N/A 01/06/2020   Procedure: ABDOMINAL EXPOSURE;  Surgeon: Oris Krystal FALCON, MD;  Location: [facility] OR;  Service: Vascular;  Laterality: N/A;   ANTERIOR LAT LUMBAR FUSION N/A 06/18/2024   Procedure: ANTERIOR LATERAL LUMBAR FUSION LUMBAR ONE-TWO, LUMBAR TWO-THREE;  Surgeon: Colon Shove, MD;  Location: [facility] OR;  Service: Neurosurgery;  Laterality: N/A;   ANTERIOR LUMBAR FUSION N/A 01/06/2020   Procedure: Lumbar Five- Sacral One  Anterior lumbar interbody fusion with infuse;  Surgeon: Colon Shove, MD;  Location: [facility] OR;  Service: Neurosurgery;  Laterality: N/A;  Lumbar Five- Sacral One  Anterior lumbar interbody fusion with infuse   CERVICAL FUSION  1996, 1997   2 surgery- skull to C7   COLON RESECTION  2007   COLONOSCOPY W/ POLYPECTOMY     CORONARY ATHERECTOMY N/A 07/01/2021   Procedure:  CORONARY ATHERECTOMY;  Surgeon: Verlin Lonni BIRCH, MD;  Location: [facility];  Service: Cardiovascular;  Laterality: N/A;   CORONARY PRESSURE/FFR STUDY N/A 06/18/2021   Procedure: INTRAVASCULAR PRESSURE WIRE/FFR STUDY;  Surgeon: Verlin Lonni BIRCH, MD;  Location: [facility];  Service: Cardiovascular;  Laterality: N/A;   CORONARY STENT INTERVENTION N/A 07/01/2021   Procedure: CORONARY STENT INTERVENTION;  Surgeon: Verlin Lonni BIRCH, MD;  Location: [facility];  Service: Cardiovascular;  Laterality: N/A;   CORONARY ULTRASOUND/IVUS N/A 07/01/2021   Procedure: Intravascular Ultrasound/IVUS;  Surgeon: Verlin Lonni BIRCH, MD;  Location: [facility];  Service: Cardiovascular;  Laterality: N/A;   HEMORRHOID SURGERY N/A 01/13/2016   Procedure: HEMORRHOIDECTOMY AND REMOVAL OF ANAL SKIN TAG;  Surgeon: Vicenta Poli, MD;  Location: [facility] OR;  Service: General;  Laterality: N/A;   LUMBAR LAMINECTOMY/DECOMPRESSION MICRODISCECTOMY N/A 07/01/2024   Procedure: LAMINECTOMY AND FORAMINOTOMY LUMBAR ONE, TWO, LUMBAR TWO, THREE LEFT;  Surgeon: Colon Shove, MD;  Location: [facility] OR;  Service: Neurosurgery;  Laterality: N/A;  LEFT L1-L2, L2-L3 LAMINOTOMY AND FORAMINOTOMY   LUMBAR PERCUTANEOUS PEDICLE SCREW 2 LEVEL N/A 06/18/2024   Procedure: LUMBAR PERCUTANEOUS PEDICLE SCREW LUMBAR ONE-THREE;  Surgeon: Colon Shove, MD;  Location: [facility] OR;  Service: Neurosurgery;  Laterality: N/A;   REPLACEMENT TOTAL KNEE Left 2005   left  RIGHT/LEFT HEART CATH AND CORONARY ANGIOGRAPHY N/A 06/18/2021   Procedure: RIGHT/LEFT HEART CATH AND CORONARY ANGIOGRAPHY;  Surgeon: Verlin Lonni BIRCH, MD;  Location: [facility];  Service: Cardiovascular;  Laterality: N/A;   SYNOVIAL CYST EXCISION     lumbar spine   TONSILLECTOMY     TOTAL KNEE ARTHROPLASTY Right 01/06/2017   Procedure: RIGHT TOTAL KNEE ARTHROPLASTY;  Surgeon: Lamar Collet, MD;  Location: [facility];  Service: Orthopedics;   Laterality: Right;  Adductior Block   Patient Active Problem List   Diagnosis Date Noted   Post-op pain 06/30/2024   Lumbar stenosis with neurogenic claudication 06/18/2024   Pure hypercholesterolemia 06/20/2023   Degenerative cervical disc 07/07/2021   Coronary artery disease involving native coronary artery of native heart with unstable angina pectoris (HCC)    CAD (coronary artery disease)    Pseudoarthrosis of lumbar spine 01/06/2020   Coronary artery calcification seen on CT scan 01/29/2019   Change in bowel habits 06/12/2018   Primary osteoarthritis of right knee 01/06/2017   S/P knee replacement 01/06/2017   Diplopia 07/10/2013   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   Sore throat 07/26/2011   DIZZINESS 09/10/2009   ATRIAL FLUTTER, PAROXYSMAL 07/09/2009   Dyslipidemia 07/07/2009   Essential hypertension 07/07/2009    PCP: Ardell Manly MD  REFERRING PROVIDER: Victory Gens MD  REFERRING DIAG:  Diagnosis  M48.061 (ICD-10-CM) - Spinal stenosis, lumbar region without neurogenic claudication    Rationale for Evaluation and Treatment: Rehabilitation  THERAPY DIAG:  Other low back pain  Other abnormalities of gait and mobility  Abnormal posture  ONSET DATE: first surgery 7/1  2nd 7/14    SUBJECTIVE:                                                                                                                                                                                           SUBJECTIVE STATEMENT: Patient is a 78 year old male with extensive lumbar spine surgery history.  He has had a complete neck fusion in the past.  He also had a fusion from L3-L5.  We recently had L1-L3 lumbar fusion.  He had complications with the surgery and had to have a second surgery.  Since that point he has had significant weakness in his legs.  He has numbness into his legs.  He also has pain into his groin.  He is having some difficulty with bowel and bladder.  He is able to  control it but needs to go to the bathroom every 2-3 hours to try.  He does report this is improving.  He has been using a walker for primary mobility.  He reports over the past few weeks his ability to use the walker is improved significantly.  He is having some difficulty standing up from lower chairs.  He has significant pain at night.  He has difficulty lying in bed.    PERTINENT HISTORY:  C1 through C7 fusion, 8 surgeries on low back and c-spine , L3-S1 fusion done twice. New L1-L3 fusion, Ocular motor nerve injury, CAD   PAIN:  Are you having pain? Yes: NPRS scale: Can reach an 8 out of 10 at night Pain location: Lumbar spine and radiating into his groin Pain description: Aching Aggravating factors: Different positions and nighttime Relieving factors: Changing positions  PRECAUTIONS: Fall  RED FLAGS: Bowel and Bladder dysfunction   WEIGHT BEARING RESTRICTIONS:  WBAT   FALLS:  Has patient fallen in last 6 months? No  LIVING ENVIRONMENT: 2 steps into the house  OCCUPATION:  Retired   Presenter, broadcasting: goes to the gym   PLOF: Independent  PATIENT GOALS:  To walk better   NEXT MD VISIT:  Nothing scheduled   OBJECTIVE:  Note: Objective measures were completed at Evaluation unless otherwise noted.  DIAGNOSTIC FINDINGS:  Post op CT angioplasty:  IMPRESSION: No suspicious finding to suggest pulmonary embolism.   Subsegmental atelectasis/consolidation of lingula.   Scattered pulmonary nodules up to 6 mm. No follow-up needed if patient is low-risk (and has no known or suspected primary neoplasm). Non-contrast chest CT can be considered in 12 months if patient is high-risk. This recommendation follows the consensus statement: Guidelines for Management of Incidental Pulmonary Nodules Detected on CT Images: From the Fleischner Society 2017; Radiology 2017; 284:228-243.   Atherosclerotic calcifications of aorta and coronary arteries.  PATIENT SURVEYS:  LEFS  Extreme  difficulty/unable (0), Quite a bit of difficulty (1), Moderate difficulty (2), Little difficulty (3), No difficulty (4) Survey date:    Any of your usual work, housework or school activities   2. Usual hobbies, recreational or sporting activities   3. Getting into/out of the bath   4. Walking between rooms   5. Putting on socks/shoes   6. Squatting    7. Lifting an object, like a bag of groceries from the floor   8. Performing light activities around your home   9. Performing heavy activities around your home   10. Getting into/out of a car   11. Walking 2 blocks   12. Walking 1 mile   13. Going up/down 10 stairs (1 flight)   14. Standing for 1 hour   15.  sitting for 1 hour   16. Running on even ground   17. Running on uneven ground   18. Making sharp turns while running fast   19. Hopping    20. Rolling over in bed   Score total:  11/80      COGNITION: Overall cognitive status: Within functional limits for tasks assessed     SENSATION: Numbness into both legs   POSTURE:  Forward head  Rounded shoulder  Stands in flexed posture   PALPATION: Significant tightness around L1-L4 bilateral Tightness extending into thoracic spine   LUMBAR ROM:   AROM eval  Flexion   Extension Pain extending to neutral   Right lateral flexion   Left lateral flexion   Right rotation 50% limited   Left rotation 50% limited    (Blank rows = not tested)  LOWER EXTREMITY ROM:    Not tested today   LOWER EXTREMITY MMT:    MMT Right eval Left eval  Hip flexion 23.8 32.0  Hip extension    Hip abduction 36.0 50.2  Hip adduction    Hip internal rotation    Hip external rotation    Knee flexion    Knee extension 20.1 22.0  Ankle dorsiflexion    Ankle plantarflexion    Ankle inversion    Ankle eversion     (Blank rows = not tested)     FUNCTIONAL TESTS:  5x sit to stand 36 seconds   GAIT: Using walker.  Flexed position Decreased hip flexion    TREATMENT DATE:   8/25                                                                                                                               There-ex:  Bilateral hip abduction 2x10 red  Hip flexion 2x10 red   Manual:  Trigger point release to lumbar and thoracic spine  Review of trigger points with the patient  PATIENT EDUCATION:  Education details: HEP, symptom management  Person educated: Patient Education method: Explanation, Demonstration, Tactile cues, Verbal cues, and Handouts Education comprehension: verbalized understanding, returned demonstration, verbal cues required, tactile cues required, and needs further education  HOME EXERCISE PROGRAM: Access Code: [email or web address] URL: [email or web address] Date: 08/12/2024 Prepared by: Alm Don  Exercises - Seated Knee Extension with Resistance  - 1 x daily - 7 x weekly - 3 sets - 10 reps - Seated March with Resistance  - 1 x daily - 7 x weekly - 3 sets - 10 reps - Seated Hip Abduction with Resistance  - 1 x daily - 7 x weekly - 3 sets - 10 reps  ASSESSMENT:  CLINICAL IMPRESSION: Patient is a 78 year old male with a past history significant for several spine surgeries including a complete cervical fusion and a complete lumbar spine fusion.  His most recent surgery resulted in significant weakness of bilateral lower extremities. He also has numbness into his legs. He has spamsing in bilateral lumbar paraspinals extending into his thoracic paraspinals. He is having difficulty walking household distances. He would benefit from killed therapy to return to active lifestyle.  OBJECTIVE IMPAIRMENTS: Abnormal gait, decreased activity tolerance, decreased endurance, decreased knowledge of condition, decreased knowledge of use of DME, decreased ROM, decreased strength, increased fascial restrictions, increased muscle spasms, postural dysfunction, and pain.   ACTIVITY LIMITATIONS: carrying, lifting, sitting, standing, squatting, sleeping,  stairs, bed mobility, continence, and locomotion level  PARTICIPATION LIMITATIONS: meal prep, cleaning, laundry, driving, shopping, community activity, and yard work  PERSONAL FACTORS: Past/current experiences and 3+ comorbidities: cervical fusion, past back fusion, current anemia  are also affecting patient's functional outcome.   REHAB POTENTIAL: Good  CLINICAL DECISION MAKING: Unstable/unpredictable numbness in both legs/ significant pain into the groin   EVALUATION COMPLEXITY: High   GOALS: Goals reviewed with patient? Yes  SHORT TERM GOALS: Target date: 09/23/2024    Patient will cone to neutral extension in standing without pain  Baseline: Goal status:  INITIAL  2.  Patient will increase gross right LE strength by 5 lbs  Baseline:  Goal status: INITIAL  3.  Patient will ambulate in his household with LRAD without significant fatigue  Baseline:  Goal status: INITIAL  4.  Patient will be independent with base HEP. Int he gym and pool  Baseline:  Goal status: INITIAL   LONG TERM GOALS: Target date: 11/04/2024     Patient will sleep through the night without significant pain Baseline:  Goal status: INITIAL  2.  Patient will ambulate community distances without increased pain  Baseline:  Goal status: INITIAL  3.  Patient will go up and down 8 steps with a reciprocal gait pattern  Baseline:  Goal status: INITIAL   PLAN:  PT FREQUENCY: 2x/week  PT DURATION: 8 weeks  PLANNED INTERVENTIONS:[id number]-Therapeutic exercises, 97530- Therapeutic activity, [id number]- Neuromuscular re-education, 97535- Self Care, [id number]- Manual therapy, [id number]- Gait training, [phone]- Aquatic Therapy, [id number]- Electrical stimulation (unattended), 97035- Ultrasound, Patient/Family education, Stair training, Taping, Dry Needling, DME instructions, Cryotherapy, and Moist heat .  PLAN FOR NEXT SESSION: Work on Insurance underwriter on the nu-step  Sit to stand training General  strengthening Progress to standing exercises as tolerated. Keep 10 lb lifting restriction until cleared by MD    Alm JINNY Don, PT 08/13/2024, 12:52 PM

## 2024-08-26 ENCOUNTER — Ambulatory Visit (HOSPITAL_BASED_OUTPATIENT_CLINIC_OR_DEPARTMENT_OTHER): Attending: Family Medicine | Admitting: Physical Therapy

## 2024-08-26 ENCOUNTER — Encounter (HOSPITAL_BASED_OUTPATIENT_CLINIC_OR_DEPARTMENT_OTHER): Payer: Self-pay | Admitting: Physical Therapy

## 2024-08-26 DIAGNOSIS — R293 Abnormal posture: Secondary | ICD-10-CM | POA: Insufficient documentation

## 2024-08-26 DIAGNOSIS — M5459 Other low back pain: Secondary | ICD-10-CM | POA: Insufficient documentation

## 2024-08-26 DIAGNOSIS — R2689 Other abnormalities of gait and mobility: Secondary | ICD-10-CM | POA: Diagnosis not present

## 2024-08-26 NOTE — Therapy (Signed)
 OUTPATIENT PHYSICAL THERAPY THORACOLUMBAR EVALUATION   Patient Name: [name] MRN: [id number] DOB:Mar 01, 1946, 78 y.o., male Today's Date: 08/27/2024  END OF SESSION:  PT End of Session - 08/26/24 1233     Visit Number 2    Number of Visits 24    Date for PT Re-Evaluation 11/04/24    Authorization Type 24    PT Start Time 1230    PT Stop Time 1313    PT Time Calculation (min) 43 min    Activity Tolerance Patient tolerated treatment well    Behavior During Therapy WFL for tasks assessed/performed           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   Paroxysmal atrial flutter (HCC)    Past Surgical History:  Procedure Laterality Date   ABDOMINAL EXPOSURE N/A 01/06/2020   Procedure: ABDOMINAL EXPOSURE;  Surgeon: Oris Krystal FALCON, MD;  Location: [facility] OR;  Service: Vascular;  Laterality: N/A;   ANTERIOR LAT LUMBAR FUSION N/A 06/18/2024   Procedure: ANTERIOR LATERAL LUMBAR FUSION LUMBAR ONE-TWO, LUMBAR TWO-THREE;  Surgeon: Colon Shove, MD;  Location: [facility] OR;  Service: Neurosurgery;  Laterality: N/A;   ANTERIOR LUMBAR FUSION N/A 01/06/2020   Procedure: Lumbar Five- Sacral One  Anterior lumbar interbody fusion with infuse;  Surgeon: Colon Shove, MD;  Location: [facility];  Service: Neurosurgery;  Laterality: N/A;  Lumbar Five- Sacral One  Anterior lumbar interbody fusion with infuse   CERVICAL FUSION  1996, 1997   2 surgery- skull to C7   COLON RESECTION  2007   COLONOSCOPY W/ POLYPECTOMY     CORONARY ATHERECTOMY N/A 07/01/2021   Procedure:  CORONARY ATHERECTOMY;  Surgeon: Verlin Lonni BIRCH, MD;  Location: [facility];  Service: Cardiovascular;  Laterality: N/A;   CORONARY PRESSURE/FFR STUDY N/A 06/18/2021   Procedure: INTRAVASCULAR PRESSURE WIRE/FFR STUDY;  Surgeon: Verlin Lonni BIRCH, MD;  Location: [facility];  Service: Cardiovascular;  Laterality: N/A;   CORONARY STENT INTERVENTION N/A 07/01/2021   Procedure: CORONARY STENT INTERVENTION;  Surgeon: Verlin Lonni BIRCH, MD;  Location: [facility];  Service: Cardiovascular;  Laterality: N/A;   CORONARY ULTRASOUND/IVUS N/A 07/01/2021   Procedure: Intravascular Ultrasound/IVUS;  Surgeon: Verlin Lonni BIRCH, MD;  Location: [facility];  Service: Cardiovascular;  Laterality: N/A;   HEMORRHOID SURGERY N/A 01/13/2016   Procedure: HEMORRHOIDECTOMY AND REMOVAL OF ANAL SKIN TAG;  Surgeon: Vicenta Poli, MD;  Location: [facility] OR;  Service: General;  Laterality: N/A;   LUMBAR LAMINECTOMY/DECOMPRESSION MICRODISCECTOMY N/A 07/01/2024   Procedure: LAMINECTOMY AND FORAMINOTOMY LUMBAR ONE, TWO, LUMBAR TWO, THREE LEFT;  Surgeon: Colon Shove, MD;  Location: [facility] OR;  Service: Neurosurgery;  Laterality: N/A;  LEFT L1-L2, L2-L3 LAMINOTOMY AND FORAMINOTOMY   LUMBAR PERCUTANEOUS PEDICLE SCREW 2 LEVEL N/A 06/18/2024   Procedure: LUMBAR PERCUTANEOUS PEDICLE SCREW LUMBAR ONE-THREE;  Surgeon: Colon Shove, MD;  Location: [facility] OR;  Service: Neurosurgery;  Laterality: N/A;   REPLACEMENT TOTAL KNEE Left 2005  left   RIGHT/LEFT HEART CATH AND CORONARY ANGIOGRAPHY N/A 06/18/2021   Procedure: RIGHT/LEFT HEART CATH AND CORONARY ANGIOGRAPHY;  Surgeon: Verlin Lonni BIRCH, MD;  Location: [facility];  Service: Cardiovascular;  Laterality: N/A;   SYNOVIAL CYST EXCISION     lumbar spine   TONSILLECTOMY     TOTAL KNEE ARTHROPLASTY Right 01/06/2017   Procedure: RIGHT TOTAL KNEE ARTHROPLASTY;  Surgeon: Lamar Collet, MD;  Location: [facility];  Service: Orthopedics;   Laterality: Right;  Adductior Block   Patient Active Problem List   Diagnosis Date Noted   Post-op pain 06/30/2024   Lumbar stenosis with neurogenic claudication 06/18/2024   Pure hypercholesterolemia 06/20/2023   Degenerative cervical disc 07/07/2021   Coronary artery disease involving native coronary artery of native heart with unstable angina pectoris (HCC)    CAD (coronary artery disease)    Pseudoarthrosis of lumbar spine 01/06/2020   Coronary artery calcification seen on CT scan 01/29/2019   Change in bowel habits 06/12/2018   Primary osteoarthritis of right knee 01/06/2017   S/P knee replacement 01/06/2017   Diplopia 07/10/2013   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   Sore throat 07/26/2011   DIZZINESS 09/10/2009   ATRIAL FLUTTER, PAROXYSMAL 07/09/2009   Dyslipidemia 07/07/2009   Essential hypertension 07/07/2009    PCP: Ardell Manly MD  REFERRING PROVIDER: Victory Gens MD  REFERRING DIAG:  Diagnosis  M48.061 (ICD-10-CM) - Spinal stenosis, lumbar region without neurogenic claudication    Rationale for Evaluation and Treatment: Rehabilitation  THERAPY DIAG:  Other low back pain  Other abnormalities of gait and mobility  Abnormal posture  ONSET DATE: first surgery 7/1  2nd 7/14    SUBJECTIVE:                                                                                                                                                                                           SUBJECTIVE STATEMENT: Patient reports he feels about the same. He did come in today without a walker. Even though his pain is the same, he does feels like he is walking further.    Patient is a 78 year old male with extensive lumbar spine surgery history.  He has had a complete neck fusion in the past.  He also had a fusion from L3-L5.  We recently had L1-L3 lumbar fusion.  He had complications with the surgery and had to have a second surgery.  Since that point he has had significant  weakness in his legs.  He has numbness into his legs.  He also has pain into his groin.  He is having some difficulty with bowel and bladder.  He is able to control it but needs to go to the bathroom every 2-3 hours to try.  He does report this is improving.  He has been using a walker for primary mobility.  He reports over the past few weeks his ability to use the walker is improved significantly.  He is having some difficulty standing up from lower chairs.  He has significant pain at night.  He has difficulty lying in bed.    PERTINENT HISTORY:  C1 through C7 fusion, 8 surgeries on low back and c-spine , L3-S1 fusion done twice. New L1-L3 fusion, Ocular motor nerve injury, CAD   PAIN:  Are you having pain? Yes: NPRS scale: Can reach an 8 out of 10 at night Pain location: Lumbar spine and radiating into his groin Pain description: Aching Aggravating factors: Different positions and nighttime Relieving factors: Changing positions  PRECAUTIONS: Fall  RED FLAGS: Bowel and Bladder dysfunction   WEIGHT BEARING RESTRICTIONS:  WBAT   FALLS:  Has patient fallen in last 6 months? No  LIVING ENVIRONMENT: 2 steps into the house  OCCUPATION:  Retired   Presenter, broadcasting: goes to the gym   PLOF: Independent  PATIENT GOALS:  To walk better   NEXT MD VISIT:  Nothing scheduled   OBJECTIVE:  Note: Objective measures were completed at Evaluation unless otherwise noted.  DIAGNOSTIC FINDINGS:  Post op CT angioplasty:  IMPRESSION: No suspicious finding to suggest pulmonary embolism.   Subsegmental atelectasis/consolidation of lingula.   Scattered pulmonary nodules up to 6 mm. No follow-up needed if patient is low-risk (and has no known or suspected primary neoplasm). Non-contrast chest CT can be considered in 12 months if patient is high-risk. This recommendation follows the consensus statement: Guidelines for Management of Incidental Pulmonary Nodules Detected on CT Images: From the  Fleischner Society 2017; Radiology 2017; 284:228-243.   Atherosclerotic calcifications of aorta and coronary arteries.  PATIENT SURVEYS:  LEFS  Extreme difficulty/unable (0), Quite a bit of difficulty (1), Moderate difficulty (2), Little difficulty (3), No difficulty (4) Survey date:    Any of your usual work, housework or school activities   2. Usual hobbies, recreational or sporting activities   3. Getting into/out of the bath   4. Walking between rooms   5. Putting on socks/shoes   6. Squatting    7. Lifting an object, like a bag of groceries from the floor   8. Performing light activities around your home   9. Performing heavy activities around your home   10. Getting into/out of a car   11. Walking 2 blocks   12. Walking 1 mile   13. Going up/down 10 stairs (1 flight)   14. Standing for 1 hour   15.  sitting for 1 hour   16. Running on even ground   17. Running on uneven ground   18. Making sharp turns while running fast   19. Hopping    20. Rolling over in bed   Score total:  11/80      COGNITION: Overall cognitive status: Within functional limits for tasks assessed     SENSATION: Numbness into both legs   POSTURE:  Forward head  Rounded shoulder  Stands in flexed posture   PALPATION: Significant tightness around L1-L4 bilateral Tightness extending into thoracic spine   LUMBAR ROM:   AROM eval  Flexion   Extension Pain extending to neutral   Right lateral flexion   Left lateral flexion   Right rotation 50% limited  Left rotation 50% limited    (Blank rows = not tested)  LOWER EXTREMITY ROM:    Not tested today   LOWER EXTREMITY MMT:    MMT Right eval Left eval  Hip flexion 23.8 32.0  Hip extension    Hip abduction 36.0 50.2  Hip adduction    Hip internal rotation    Hip external rotation    Knee flexion    Knee extension 20.1 22.0  Ankle dorsiflexion    Ankle plantarflexion    Ankle inversion    Ankle eversion     (Blank rows = not  tested)     FUNCTIONAL TESTS:  5x sit to stand 36 seconds   GAIT: Using walker.  Flexed position Decreased hip flexion    TREATMENT DATE:  9/8 Manual:  Trigger point release to lumbar spine and guteals   There-ex: Ball roll out fwd and lateral x10 5 sec hold at end    Neuro-re-ed:   Row 3x10 with cuing for posture and abdominal bracing   Shoulder extension with cuing for abdominal bracing 3x10 red   Seated rest breaks as needed.     8/25                                                                                                                               There-ex:  Bilateral hip abduction 2x10 red  Hip flexion 2x10 red   Manual:  Trigger point release to lumbar and thoracic spine  Review of trigger points with the patient  PATIENT EDUCATION:  Education details: HEP, symptom management  Person educated: Patient Education method: Explanation, Demonstration, Tactile cues, Verbal cues, and Handouts Education comprehension: verbalized understanding, returned demonstration, verbal cues required, tactile cues required, and needs further education  HOME EXERCISE PROGRAM: Access Code: [email or web address] URL: [email or web address] Date: 08/12/2024 Prepared by: Alm Don  Exercises - Seated Knee Extension with Resistance  - 1 x daily - 7 x weekly - 3 sets - 10 reps - Seated March with Resistance  - 1 x daily - 7 x weekly - 3 sets - 10 reps - Seated Hip Abduction with Resistance  - 1 x daily - 7 x weekly - 3 sets - 10 reps  ASSESSMENT:  CLINICAL IMPRESSION: The patient tolerated treatment well. We worked on standing g postural exercises. The patient feels like he is bending over. He is but he has improved since the surgery. He continues to have significant trigger points but he had less total back tension today. Therapy will continue to progress as tolerated.    Eval: Patient is a 78 year old male with a past history significant for several spine  surgeries including a complete cervical fusion and a complete lumbar spine fusion.  His most recent surgery resulted in significant weakness of bilateral lower extremities. He also has numbness into his legs. He has spamsing in bilateral lumbar paraspinals extending into his thoracic paraspinals. He is having difficulty walking household distances.  He would benefit from killed therapy to return to active lifestyle.  OBJECTIVE IMPAIRMENTS: Abnormal gait, decreased activity tolerance, decreased endurance, decreased knowledge of condition, decreased knowledge of use of DME, decreased ROM, decreased strength, increased fascial restrictions, increased muscle spasms, postural dysfunction, and pain.   ACTIVITY LIMITATIONS: carrying, lifting, sitting, standing, squatting, sleeping, stairs, bed mobility, continence, and locomotion level  PARTICIPATION LIMITATIONS: meal prep, cleaning, laundry, driving, shopping, community activity, and yard work  PERSONAL FACTORS: Past/current experiences and 3+ comorbidities: cervical fusion, past back fusion, current anemia  are also affecting patient's functional outcome.   REHAB POTENTIAL: Good  CLINICAL DECISION MAKING: Unstable/unpredictable numbness in both legs/ significant pain into the groin   EVALUATION COMPLEXITY: High   GOALS: Goals reviewed with patient? Yes  SHORT TERM GOALS: Target date: 09/23/2024    Patient will cone to neutral extension in standing without pain  Baseline: Goal status: INITIAL  2.  Patient will increase gross right LE strength by 5 lbs  Baseline:  Goal status: INITIAL  3.  Patient will ambulate in his household with LRAD without significant fatigue  Baseline:  Goal status: INITIAL  4.  Patient will be independent with base HEP. Int he gym and pool  Baseline:  Goal status: INITIAL   LONG TERM GOALS: Target date: 11/04/2024     Patient will sleep through the night without significant pain Baseline:  Goal status:  INITIAL  2.  Patient will ambulate community distances without increased pain  Baseline:  Goal status: INITIAL  3.  Patient will go up and down 8 steps with a reciprocal gait pattern  Baseline:  Goal status: INITIAL   PLAN:  PT FREQUENCY: 2x/week  PT DURATION: 8 weeks  PLANNED INTERVENTIONS:[id number]-Therapeutic exercises, 97530- Therapeutic activity, [id number]- Neuromuscular re-education, 97535- Self Care, [id number]- Manual therapy, [id number]- Gait training, [phone]- Aquatic Therapy, [id number]- Electrical stimulation (unattended), 97035- Ultrasound, Patient/Family education, Stair training, Taping, Dry Needling, DME instructions, Cryotherapy, and Moist heat .  PLAN FOR NEXT SESSION: Work on Insurance underwriter on the nu-step  Sit to stand training General strengthening Progress to standing exercises as tolerated. Keep 10 lb lifting restriction until cleared by MD    Alm JINNY Don, PT 08/27/2024, 8:43 AM

## 2024-08-27 ENCOUNTER — Encounter (HOSPITAL_BASED_OUTPATIENT_CLINIC_OR_DEPARTMENT_OTHER): Payer: Self-pay | Admitting: Physical Therapy

## 2024-09-02 ENCOUNTER — Ambulatory Visit (HOSPITAL_BASED_OUTPATIENT_CLINIC_OR_DEPARTMENT_OTHER): Admitting: Physical Therapy

## 2024-09-02 DIAGNOSIS — R293 Abnormal posture: Secondary | ICD-10-CM | POA: Diagnosis not present

## 2024-09-02 DIAGNOSIS — M5459 Other low back pain: Secondary | ICD-10-CM | POA: Diagnosis not present

## 2024-09-02 DIAGNOSIS — R2689 Other abnormalities of gait and mobility: Secondary | ICD-10-CM

## 2024-09-02 NOTE — Therapy (Signed)
 OUTPATIENT PHYSICAL THERAPY THORACOLUMBAR EVALUATION   Patient Name: [name] MRN: [id number] DOB:[birth year], 78 y.o., male Today's Date: 09/03/2024  END OF SESSION:  PT End of Session - 09/03/24 0806     Visit Number 3    Number of Visits 24    Date for PT Re-Evaluation 11/04/24    Authorization Type 24    PT Start Time 1230    PT Stop Time 1312    PT Time Calculation (min) 42 min    Activity Tolerance Patient tolerated treatment well    Behavior During Therapy WFL for tasks assessed/performed            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   Paroxysmal atrial flutter (HCC)    Past Surgical History:  Procedure Laterality Date   ABDOMINAL EXPOSURE N/A 01/06/2020   Procedure: ABDOMINAL EXPOSURE;  Surgeon: Oris Krystal FALCON, MD;  Location: [facility] OR;  Service: Vascular;  Laterality: N/A;   ANTERIOR LAT LUMBAR FUSION N/A 06/18/2024   Procedure: ANTERIOR LATERAL LUMBAR FUSION LUMBAR ONE-TWO, LUMBAR TWO-THREE;  Surgeon: Colon Shove, MD;  Location: [facility] OR;  Service: Neurosurgery;  Laterality: N/A;   ANTERIOR LUMBAR FUSION N/A 01/06/2020   Procedure: Lumbar Five- Sacral One  Anterior lumbar interbody fusion with infuse;  Surgeon: Colon Shove, MD;  Location: [facility] OR;  Service: Neurosurgery;  Laterality: N/A;  Lumbar Five- Sacral One  Anterior lumbar interbody fusion with infuse   CERVICAL FUSION  1996, 1997   2 surgery- skull to C7   COLON RESECTION  2007   COLONOSCOPY W/ POLYPECTOMY     CORONARY ATHERECTOMY N/A 07/01/2021    Procedure: CORONARY ATHERECTOMY;  Surgeon: Verlin Lonni BIRCH, MD;  Location: [facility];  Service: Cardiovascular;  Laterality: N/A;   CORONARY PRESSURE/FFR STUDY N/A 06/18/2021   Procedure: INTRAVASCULAR PRESSURE WIRE/FFR STUDY;  Surgeon: Verlin Lonni BIRCH, MD;  Location: [facility];  Service: Cardiovascular;  Laterality: N/A;   CORONARY STENT INTERVENTION N/A 07/01/2021   Procedure: CORONARY STENT INTERVENTION;  Surgeon: Verlin Lonni BIRCH, MD;  Location: [facility];  Service: Cardiovascular;  Laterality: N/A;   CORONARY ULTRASOUND/IVUS N/A 07/01/2021   Procedure: Intravascular Ultrasound/IVUS;  Surgeon: Verlin Lonni BIRCH, MD;  Location: [facility];  Service: Cardiovascular;  Laterality: N/A;   HEMORRHOID SURGERY N/A 01/13/2016   Procedure: HEMORRHOIDECTOMY AND REMOVAL OF ANAL SKIN TAG;  Surgeon: Vicenta Poli, MD;  Location: [facility] OR;  Service: General;  Laterality: N/A;   LUMBAR LAMINECTOMY/DECOMPRESSION MICRODISCECTOMY N/A 07/01/2024   Procedure: LAMINECTOMY AND FORAMINOTOMY LUMBAR ONE, TWO, LUMBAR TWO, THREE LEFT;  Surgeon: Colon Shove, MD;  Location: [facility] OR;  Service: Neurosurgery;  Laterality: N/A;  LEFT L1-L2, L2-L3 LAMINOTOMY AND FORAMINOTOMY   LUMBAR PERCUTANEOUS PEDICLE SCREW 2 LEVEL N/A 06/18/2024   Procedure: LUMBAR PERCUTANEOUS PEDICLE SCREW LUMBAR ONE-THREE;  Surgeon: Colon Shove, MD;  Location: [facility] OR;  Service: Neurosurgery;  Laterality: N/A;   REPLACEMENT TOTAL KNEE Left 2005  left   RIGHT/LEFT HEART CATH AND CORONARY ANGIOGRAPHY N/A 06/18/2021   Procedure: RIGHT/LEFT HEART CATH AND CORONARY ANGIOGRAPHY;  Surgeon: Verlin Lonni BIRCH, MD;  Location: [facility];  Service: Cardiovascular;  Laterality: N/A;   SYNOVIAL CYST EXCISION     lumbar spine   TONSILLECTOMY     TOTAL KNEE ARTHROPLASTY Right 01/06/2017   Procedure: RIGHT TOTAL KNEE ARTHROPLASTY;  Surgeon: Lamar Collet, MD;  Location: [facility] OR[facility];  Service: Orthopedics;   Laterality: Right;  Adductior Block   Patient Active Problem List   Diagnosis Date Noted   Post-op pain 06/30/2024   Lumbar stenosis with neurogenic claudication 06/18/2024   Pure hypercholesterolemia 06/20/2023   Degenerative cervical disc 07/07/2021   Coronary artery disease involving native coronary artery of native heart with unstable angina pectoris (HCC)    CAD (coronary artery disease)    Pseudoarthrosis of lumbar spine 01/06/2020   Coronary artery calcification seen on CT scan 01/29/2019   Change in bowel habits 06/12/2018   Primary osteoarthritis of right knee 01/06/2017   S/P knee replacement 01/06/2017   Diplopia 07/10/2013   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  ONSET DATE: first surgery 7/1  2nd 7/14    SUBJECTIVE:                                                                                                                                                                                           SUBJECTIVE STATEMENT: The patient comes in without a walker today. He feels like he is about the same. He is working on standing up straighter but when he does his legs get weak.   Patient is a 78 year old male with extensive lumbar spine surgery history.  He has had a complete neck fusion in the past.  He also had a fusion from L3-L5.  We recently had L1-L3 lumbar fusion.  He had complications with the surgery and had to have a second surgery.  Since that point he has had significant weakness in his legs.  He has numbness into his legs.  He also  has pain into his groin.  He is having some difficulty with bowel and bladder.  He is able to control it but needs to go to  the bathroom every 2-3 hours to try.  He does report this is improving.  He has been using a walker for primary mobility.  He reports over the past few weeks his ability to use the walker is improved significantly.  He is having some difficulty standing up from lower chairs.  He has significant pain at night.  He has difficulty lying in bed.    PERTINENT HISTORY:  C1 through C7 fusion, 8 surgeries on low back and c-spine , L3-S1 fusion done twice. New L1-L3 fusion, Ocular motor nerve injury, CAD   PAIN:  Are you having pain? Yes: NPRS scale: Can reach an 8 out of 10 at night Pain location: Lumbar spine and radiating into his groin Pain description: Aching Aggravating factors: Different positions and nighttime Relieving factors: Changing positions  PRECAUTIONS: Fall  RED FLAGS: Bowel and Bladder dysfunction   WEIGHT BEARING RESTRICTIONS:  WBAT   FALLS:  Has patient fallen in last 6 months? No  LIVING ENVIRONMENT: 2 steps into the house  OCCUPATION:  Retired   Presenter, broadcasting: goes to the gym   PLOF: Independent  PATIENT GOALS:  To walk better   NEXT MD VISIT:  Nothing scheduled   OBJECTIVE:  Note: Objective measures were completed at Evaluation unless otherwise noted.  DIAGNOSTIC FINDINGS:  Post op CT angioplasty:  IMPRESSION: No suspicious finding to suggest pulmonary embolism.   Subsegmental atelectasis/consolidation of lingula.   Scattered pulmonary nodules up to 6 mm. No follow-up needed if patient is low-risk (and has no known or suspected primary neoplasm). Non-contrast chest CT can be considered in 12 months if patient is high-risk. This recommendation follows the consensus statement: Guidelines for Management of Incidental Pulmonary Nodules Detected on CT Images: From the Fleischner Society 2017; Radiology 2017; 284:228-243.    Atherosclerotic calcifications of aorta and coronary arteries.  PATIENT SURVEYS:  LEFS  Extreme difficulty/unable (0), Quite a bit of difficulty (1), Moderate difficulty (2), Little difficulty (3), No difficulty (4) Survey date:    Any of your usual work, housework or school activities   2. Usual hobbies, recreational or sporting activities   3. Getting into/out of the bath   4. Walking between rooms   5. Putting on socks/shoes   6. Squatting    7. Lifting an object, like a bag of groceries from the floor   8. Performing light activities around your home   9. Performing heavy activities around your home   10. Getting into/out of a car   11. Walking 2 blocks   12. Walking 1 mile   13. Going up/down 10 stairs (1 flight)   14. Standing for 1 hour   15.  sitting for 1 hour   16. Running on even ground   17. Running on uneven ground   18. Making sharp turns while running fast   19. Hopping    20. Rolling over in bed   Score total:  11/80      COGNITION: Overall cognitive status: Within functional limits for tasks assessed     SENSATION: Numbness into both legs   POSTURE:  Forward head  Rounded shoulder  Stands in flexed posture   PALPATION: Significant tightness around L1-L4 bilateral Tightness extending into thoracic spine   LUMBAR ROM:   AROM eval  Flexion   Extension Pain extending to neutral   Right lateral flexion   Left lateral flexion   Right rotation 50% limited   Left rotation 50% limited    (Blank rows = not  tested)  LOWER EXTREMITY ROM:    Not tested today   LOWER EXTREMITY MMT:    MMT Right eval Left eval  Hip flexion 23.8 32.0  Hip extension    Hip abduction 36.0 50.2  Hip adduction    Hip internal rotation    Hip external rotation    Knee flexion    Knee extension 20.1 22.0  Ankle dorsiflexion    Ankle plantarflexion    Ankle inversion    Ankle eversion     (Blank rows = not tested)     FUNCTIONAL TESTS:  5x sit to stand 36  seconds   GAIT: Using walker.  Flexed position Decreased hip flexion    TREATMENT DATE:  9/16  Manual:  Trigger point release to lumbar spine and guteals  Reviewed self soft tissue mobilization   Neuro-re -ed:  Standing biceps curl with cuing for posture   3 lbs 3x12 with seated rest break in between.  2lb punch 3x12 with cuing   Seated march with cueing for posture 3x12 blue   Seated hip abduction with cuing for posture 3x12 blue   There-ex: nu-step with cuing   9/8 Manual:  Trigger point release to lumbar spine and guteals   There-ex: Ball roll out fwd and lateral x10 5 sec hold at end    Neuro-re-ed:   Row 3x10 with cuing for posture and abdominal bracing   Shoulder extension with cuing for abdominal bracing 3x10 red   Seated rest breaks as needed.     8/25                                                                                                                               There-ex:  Bilateral hip abduction 2x10 red  Hip flexion 2x10 red   Manual:  Trigger point release to lumbar and thoracic spine  Review of trigger points with the patient  PATIENT EDUCATION:  Education details: HEP, symptom management  Person educated: Patient Education method: Explanation, Demonstration, Tactile cues, Verbal cues, and Handouts Education comprehension: verbalized understanding, returned demonstration, verbal cues required, tactile cues required, and needs further education  HOME EXERCISE PROGRAM: Access Code: [email or web address] URL: [email or web address] Date: 08/12/2024 Prepared by: Alm Don  Exercises - Seated Knee Extension with Resistance  - 1 x daily - 7 x weekly - 3 sets - 10 reps - Seated March with Resistance  - 1 x daily - 7 x weekly - 3 sets - 10 reps - Seated Hip Abduction with Resistance  - 1 x daily - 7 x weekly - 3 sets - 10 reps  ASSESSMENT:  CLINICAL IMPRESSION: The patient was advised to be patient. It will take time  for him to build the muscles back for him to be able to stand straighter. He has been able to do more exercises. He has also been able to return to the gym and start riding the nu-step. He continues to have significant  spasming in his back.    Eval: Patient is a 78 year old male with a past history significant for several spine surgeries including a complete cervical fusion and a complete lumbar spine fusion.  His most recent surgery resulted in significant weakness of bilateral lower extremities. He also has numbness into his legs. He has spamsing in bilateral lumbar paraspinals extending into his thoracic paraspinals. He is having difficulty walking household distances. He would benefit from killed therapy to return to active lifestyle.  OBJECTIVE IMPAIRMENTS: Abnormal gait, decreased activity tolerance, decreased endurance, decreased knowledge of condition, decreased knowledge of use of DME, decreased ROM, decreased strength, increased fascial restrictions, increased muscle spasms, postural dysfunction, and pain.   ACTIVITY LIMITATIONS: carrying, lifting, sitting, standing, squatting, sleeping, stairs, bed mobility, continence, and locomotion level  PARTICIPATION LIMITATIONS: meal prep, cleaning, laundry, driving, shopping, community activity, and yard work  PERSONAL FACTORS: Past/current experiences and 3+ comorbidities: cervical fusion, past back fusion, current anemia  are also affecting patient's functional outcome.   REHAB POTENTIAL: Good  CLINICAL DECISION MAKING: Unstable/unpredictable numbness in both legs/ significant pain into the groin   EVALUATION COMPLEXITY: High   GOALS: Goals reviewed with patient? Yes  SHORT TERM GOALS: Target date: 09/23/2024    Patient will cone to neutral extension in standing without pain  Baseline: Goal status: INITIAL  2.  Patient will increase gross right LE strength by 5 lbs  Baseline:  Goal status: INITIAL  3.  Patient will ambulate in  his household with LRAD without significant fatigue  Baseline:  Goal status: INITIAL  4.  Patient will be independent with base HEP. Int he gym and pool  Baseline:  Goal status: INITIAL   LONG TERM GOALS: Target date: 11/04/2024     Patient will sleep through the night without significant pain Baseline:  Goal status: INITIAL  2.  Patient will ambulate community distances without increased pain  Baseline:  Goal status: INITIAL  3.  Patient will go up and down 8 steps with a reciprocal gait pattern  Baseline:  Goal status: INITIAL   PLAN:  PT FREQUENCY: 2x/week  PT DURATION: 8 weeks  PLANNED INTERVENTIONS:[id number]-Therapeutic exercises, 97530- Therapeutic activity, [id number]- Neuromuscular re-education, 97535- Self Care, [id number]- Manual therapy, [id number]- Gait training, [phone]- Aquatic Therapy, [id number]- Electrical stimulation (unattended), 97035- Ultrasound, Patient/Family education, Stair training, Taping, Dry Needling, DME instructions, Cryotherapy, and Moist heat .  PLAN FOR NEXT SESSION: Work on Insurance underwriter on the nu-step  Sit to stand training General strengthening Progress to standing exercises as tolerated. Keep 10 lb lifting restriction until cleared by MD    Alm JINNY Don, PT 09/03/2024, 8:07 AM

## 2024-09-03 ENCOUNTER — Encounter (HOSPITAL_BASED_OUTPATIENT_CLINIC_OR_DEPARTMENT_OTHER): Payer: Self-pay | Admitting: Physical Therapy

## 2024-09-11 ENCOUNTER — Encounter (HOSPITAL_BASED_OUTPATIENT_CLINIC_OR_DEPARTMENT_OTHER): Payer: Self-pay | Admitting: Physical Therapy

## 2024-09-11 ENCOUNTER — Ambulatory Visit (HOSPITAL_BASED_OUTPATIENT_CLINIC_OR_DEPARTMENT_OTHER): Admitting: Physical Therapy

## 2024-09-11 DIAGNOSIS — R293 Abnormal posture: Secondary | ICD-10-CM | POA: Diagnosis not present

## 2024-09-11 DIAGNOSIS — M5459 Other low back pain: Secondary | ICD-10-CM | POA: Diagnosis not present

## 2024-09-11 DIAGNOSIS — R2689 Other abnormalities of gait and mobility: Secondary | ICD-10-CM

## 2024-09-11 NOTE — Therapy (Signed)
 OUTPATIENT PHYSICAL THERAPY THORACOLUMBAR EVALUATION   Patient Name: [name] MRN: [id number] DOB:02-20-1946, 78 y.o., male Today's Date: 09/11/2024  END OF SESSION:  PT End of Session - 09/11/24 1639     Visit Number 4    Number of Visits 24    Date for Recertification  11/04/24    PT Start Time 0400    PT Stop Time 0441    PT Time Calculation (min) 41 min    Activity Tolerance Patient tolerated treatment well    Behavior During Therapy [facility] for tasks assessed/performed            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   Paroxysmal atrial flutter (HCC)    Past Surgical History:  Procedure Laterality Date   ABDOMINAL EXPOSURE N/A 01/06/2020   Procedure: ABDOMINAL EXPOSURE;  Surgeon: Oris Krystal FALCON, MD;  Location: [facility] OR;  Service: Vascular;  Laterality: N/A;   ANTERIOR LAT LUMBAR FUSION N/A 06/18/2024   Procedure: ANTERIOR LATERAL LUMBAR FUSION LUMBAR ONE-TWO, LUMBAR TWO-THREE;  Surgeon: Colon Shove, MD;  Location: [facility] OR;  Service: Neurosurgery;  Laterality: N/A;   ANTERIOR LUMBAR FUSION N/A 01/06/2020   Procedure: Lumbar Five- Sacral One  Anterior lumbar interbody fusion with infuse;  Surgeon: Colon Shove, MD;  Location: [facility] OR;  Service: Neurosurgery;  Laterality: N/A;  Lumbar Five- Sacral One  Anterior lumbar interbody fusion with infuse   CERVICAL FUSION  1996, 1997   2 surgery- skull to C7   COLON RESECTION  2007   COLONOSCOPY W/ POLYPECTOMY     CORONARY ATHERECTOMY N/A 07/01/2021   Procedure: CORONARY ATHERECTOMY;   Surgeon: Verlin Lonni BIRCH, MD;  Location: [facility] IN[facility];  Service: Cardiovascular;  Laterality: N/A;   CORONARY PRESSURE/FFR STUDY N/A 06/18/2021   Procedure: INTRAVASCULAR PRESSURE WIRE/FFR STUDY;  Surgeon: Verlin Lonni BIRCH, MD;  Location: [facility];  Service: Cardiovascular;  Laterality: N/A;   CORONARY STENT INTERVENTION N/A 07/01/2021   Procedure: CORONARY STENT INTERVENTION;  Surgeon: Verlin Lonni BIRCH, MD;  Location: [facility];  Service: Cardiovascular;  Laterality: N/A;   CORONARY ULTRASOUND/IVUS N/A 07/01/2021   Procedure: Intravascular Ultrasound/IVUS;  Surgeon: Verlin Lonni BIRCH, MD;  Location: [facility];  Service: Cardiovascular;  Laterality: N/A;   HEMORRHOID SURGERY N/A 01/13/2016   Procedure: HEMORRHOIDECTOMY AND REMOVAL OF ANAL SKIN TAG;  Surgeon: Vicenta Poli, MD;  Location: [facility] OR;  Service: General;  Laterality: N/A;   LUMBAR LAMINECTOMY/DECOMPRESSION MICRODISCECTOMY N/A 07/01/2024   Procedure: LAMINECTOMY AND FORAMINOTOMY LUMBAR ONE, TWO, LUMBAR TWO, THREE LEFT;  Surgeon: Colon Shove, MD;  Location: [facility] OR;  Service: Neurosurgery;  Laterality: N/A;  LEFT L1-L2, L2-L3 LAMINOTOMY AND FORAMINOTOMY   LUMBAR PERCUTANEOUS PEDICLE SCREW 2 LEVEL N/A 06/18/2024   Procedure: LUMBAR PERCUTANEOUS PEDICLE SCREW LUMBAR ONE-THREE;  Surgeon: Colon Shove, MD;  Location: [facility] OR;  Service: Neurosurgery;  Laterality: N/A;   REPLACEMENT TOTAL KNEE Left 2005   left   RIGHT/LEFT HEART  CATH AND CORONARY ANGIOGRAPHY N/A 06/18/2021   Procedure: RIGHT/LEFT HEART CATH AND CORONARY ANGIOGRAPHY;  Surgeon: Verlin Lonni BIRCH, MD;  Location: [facility];  Service: Cardiovascular;  Laterality: N/A;   SYNOVIAL CYST EXCISION     lumbar spine   TONSILLECTOMY     TOTAL KNEE ARTHROPLASTY Right 01/06/2017   Procedure: RIGHT TOTAL KNEE ARTHROPLASTY;  Surgeon: Lamar Collet, MD;  Location: [facility] ORS;  Service: Orthopedics;  Laterality: Right;  Adductior  Block   Patient Active Problem List   Diagnosis Date Noted   Post-op pain 06/30/2024   Lumbar stenosis with neurogenic claudication 06/18/2024   Pure hypercholesterolemia 06/20/2023   Degenerative cervical disc 07/07/2021   Coronary artery disease involving native coronary artery of native heart with unstable angina pectoris (HCC)    CAD (coronary artery disease)    Pseudoarthrosis of lumbar spine 01/06/2020   Coronary artery calcification seen on CT scan 01/29/2019   Change in bowel habits 06/12/2018   Primary osteoarthritis of right knee 01/06/2017   S/P knee replacement 01/06/2017   Diplopia 07/10/2013   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   Sore throat 07/26/2011   DIZZINESS 09/10/2009   ATRIAL FLUTTER, PAROXYSMAL 07/09/2009   Dyslipidemia 07/07/2009   Essential hypertension 07/07/2009    PCP: Ardell Manly MD  REFERRING PROVIDER: Victory Gens MD  REFERRING DIAG:  Diagnosis  M48.061 (ICD-10-CM) - Spinal stenosis, lumbar region without neurogenic claudication    Rationale for Evaluation and Treatment: Rehabilitation  THERAPY DIAG:  Other low back pain  Other abnormalities of gait and mobility  Abnormal posture  ONSET DATE: first surgery 7/1  2nd 7/14    SUBJECTIVE:                                                                                                                                                                                           SUBJECTIVE STATEMENT: The patient went to the gym today. He was able to ride the elliptical for 5 min.   Patient is a 78 year old male with extensive lumbar spine surgery history.  He has had a complete neck fusion in the past.  He also had a fusion from L3-L5.  We recently had L1-L3 lumbar fusion.  He had complications with the surgery and had to have a second surgery.  Since that point he has had significant weakness in his legs.  He has numbness into his legs.  He also has pain into his groin.  He is having some  difficulty with bowel and bladder.  He is able to control it but needs to go to the bathroom every 2-3 hours to try.  He does report this is improving.  He has been using a walker for primary mobility.  He reports over the past few weeks his ability to use the walker is improved significantly.  He is having some difficulty standing up from lower chairs.  He has significant pain at night.  He has difficulty lying in bed.    PERTINENT HISTORY:  C1 through C7 fusion, 8 surgeries on low back and c-spine , L3-S1 fusion done twice. New L1-L3 fusion, Ocular motor nerve injury, CAD   PAIN:  Are you having pain? Yes: NPRS scale: Can reach an 8 out of 10 at night Pain location: Lumbar spine and radiating into his groin Pain description: Aching Aggravating factors: Different positions and nighttime Relieving factors: Changing positions  PRECAUTIONS: Fall  RED FLAGS: Bowel and Bladder dysfunction   WEIGHT BEARING RESTRICTIONS:  WBAT   FALLS:  Has patient fallen in last 6 months? No  LIVING ENVIRONMENT: 2 steps into the house  OCCUPATION:  Retired   Presenter, broadcasting: goes to the gym   PLOF: Independent  PATIENT GOALS:  To walk better   NEXT MD VISIT:  Nothing scheduled   OBJECTIVE:  Note: Objective measures were completed at Evaluation unless otherwise noted.  DIAGNOSTIC FINDINGS:  Post op CT angioplasty:  IMPRESSION: No suspicious finding to suggest pulmonary embolism.   Subsegmental atelectasis/consolidation of lingula.   Scattered pulmonary nodules up to 6 mm. No follow-up needed if patient is low-risk (and has no known or suspected primary neoplasm). Non-contrast chest CT can be considered in 12 months if patient is high-risk. This recommendation follows the consensus statement: Guidelines for Management of Incidental Pulmonary Nodules Detected on CT Images: From the Fleischner Society 2017; Radiology 2017; 284:228-243.   Atherosclerotic calcifications of aorta and coronary  arteries.  PATIENT SURVEYS:  LEFS  Extreme difficulty/unable (0), Quite a bit of difficulty (1), Moderate difficulty (2), Little difficulty (3), No difficulty (4) Survey date:    Any of your usual work, housework or school activities   2. Usual hobbies, recreational or sporting activities   3. Getting into/out of the bath   4. Walking between rooms   5. Putting on socks/shoes   6. Squatting    7. Lifting an object, like a bag of groceries from the floor   8. Performing light activities around your home   9. Performing heavy activities around your home   10. Getting into/out of a car   11. Walking 2 blocks   12. Walking 1 mile   13. Going up/down 10 stairs (1 flight)   14. Standing for 1 hour   15.  sitting for 1 hour   16. Running on even ground   17. Running on uneven ground   18. Making sharp turns while running fast   19. Hopping    20. Rolling over in bed   Score total:  11/80      COGNITION: Overall cognitive status: Within functional limits for tasks assessed     SENSATION: Numbness into both legs   POSTURE:  Forward head  Rounded shoulder  Stands in flexed posture   PALPATION: Significant tightness around L1-L4 bilateral Tightness extending into thoracic spine   LUMBAR ROM:   AROM eval  Flexion   Extension Pain extending to neutral   Right lateral flexion   Left lateral flexion   Right rotation 50% limited   Left rotation 50% limited    (Blank rows = not tested)  LOWER EXTREMITY ROM:  Not tested today   LOWER EXTREMITY MMT:    MMT Right eval Left eval  Hip flexion 23.8 32.0  Hip extension    Hip abduction 36.0 50.2  Hip adduction    Hip internal rotation    Hip external rotation    Knee flexion    Knee extension 20.1 22.0  Ankle dorsiflexion    Ankle plantarflexion    Ankle inversion    Ankle eversion     (Blank rows = not tested)     FUNCTIONAL TESTS:  5x sit to stand 36 seconds   GAIT: Using walker.  Flexed  position Decreased hip flexion    TREATMENT DATE:  9/25 Manual:  Trigger point release to lumbar spine and guteals  Reviewed self soft tissue mobilization  LAD grade II and III 2x10   There-ex:  Ball roll out x10 5 sec hold fwd and lateral   Neuro-re-ed:  Wall posture 5x 10 sec holds with cuing not to force his posture   9/16  Manual:  Trigger point release to lumbar spine and guteals  Reviewed self soft tissue mobilization   Neuro-re -ed:  Standing biceps curl with cuing for posture   3 lbs 3x12 with seated rest break in between.  2lb punch 3x12 with cuing   Seated march with cueing for posture 3x12 blue   Seated hip abduction with cuing for posture 3x12 blue   There-ex: nu-step with cuing   9/8 Manual:  Trigger point release to lumbar spine and guteals   There-ex: Ball roll out fwd and lateral x10 5 sec hold at end    Neuro-re-ed:   Row 3x10 with cuing for posture and abdominal bracing   Shoulder extension with cuing for abdominal bracing 3x10 red   Seated rest breaks as needed.     8/25                                                                                                                               There-ex:  Bilateral hip abduction 2x10 red  Hip flexion 2x10 red   Manual:  Trigger point release to lumbar and thoracic spine  Review of trigger points with the patient  PATIENT EDUCATION:  Education details: HEP, symptom management  Person educated: Patient Education method: Explanation, Demonstration, Tactile cues, Verbal cues, and Handouts Education comprehension: verbalized understanding, returned demonstration, verbal cues required, tactile cues required, and needs further education  HOME EXERCISE PROGRAM: Access Code: [email or web address] URL: [email or web address] Date: 08/12/2024 Prepared by: Alm Don  Exercises - Seated Knee Extension with Resistance  - 1 x daily - 7 x weekly - 3 sets - 10 reps - Seated March  with Resistance  - 1 x daily - 7 x weekly - 3 sets - 10 reps - Seated Hip Abduction with Resistance  - 1 x daily - 7 x weekly - 3 sets - 10 reps  ASSESSMENT:  CLINICAL IMPRESSION: The patient had already worked  out today. We focused more on stretching and manual therapy. His lumbar para spinals were the loosest they have been. We worked on wall post. He was advised not to force his poustre but to work on it slowly over a period of time. We emphasized this several times.   Eval: Patient is a 78 year old male with a past history significant for several spine surgeries including a complete cervical fusion and a complete lumbar spine fusion.  His most recent surgery resulted in significant weakness of bilateral lower extremities. He also has numbness into his legs. He has spamsing in bilateral lumbar paraspinals extending into his thoracic paraspinals. He is having difficulty walking household distances. He would benefit from killed therapy to return to active lifestyle.  OBJECTIVE IMPAIRMENTS: Abnormal gait, decreased activity tolerance, decreased endurance, decreased knowledge of condition, decreased knowledge of use of DME, decreased ROM, decreased strength, increased fascial restrictions, increased muscle spasms, postural dysfunction, and pain.   ACTIVITY LIMITATIONS: carrying, lifting, sitting, standing, squatting, sleeping, stairs, bed mobility, continence, and locomotion level  PARTICIPATION LIMITATIONS: meal prep, cleaning, laundry, driving, shopping, community activity, and yard work  PERSONAL FACTORS: Past/current experiences and 3+ comorbidities: cervical fusion, past back fusion, current anemia  are also affecting patient's functional outcome.   REHAB POTENTIAL: Good  CLINICAL DECISION MAKING: Unstable/unpredictable numbness in both legs/ significant pain into the groin   EVALUATION COMPLEXITY: High   GOALS: Goals reviewed with patient? Yes  SHORT TERM GOALS: Target date:  09/23/2024    Patient will cone to neutral extension in standing without pain  Baseline: Goal status: INITIAL  2.  Patient will increase gross right LE strength by 5 lbs  Baseline:  Goal status: INITIAL  3.  Patient will ambulate in his household with LRAD without significant fatigue  Baseline:  Goal status: INITIAL  4.  Patient will be independent with base HEP. Int he gym and pool  Baseline:  Goal status: INITIAL   LONG TERM GOALS: Target date: 11/04/2024     Patient will sleep through the night without significant pain Baseline:  Goal status: INITIAL  2.  Patient will ambulate community distances without increased pain  Baseline:  Goal status: INITIAL  3.  Patient will go up and down 8 steps with a reciprocal gait pattern  Baseline:  Goal status: INITIAL   PLAN:  PT FREQUENCY: 2x/week  PT DURATION: 8 weeks  PLANNED INTERVENTIONS:[id number]-Therapeutic exercises, 97530- Therapeutic activity, [id number]- Neuromuscular re-education, 97535- Self Care, [id number]- Manual therapy, [id number]- Gait training, [phone]- Aquatic Therapy, [id number]- Electrical stimulation (unattended), 97035- Ultrasound, Patient/Family education, Stair training, Taping, Dry Needling, DME instructions, Cryotherapy, and Moist heat .  PLAN FOR NEXT SESSION: Work on Insurance underwriter on the nu-step  Sit to stand training General strengthening Progress to standing exercises as tolerated. Keep 10 lb lifting restriction until cleared by MD    Alm JINNY Don, PT 09/11/2024, 4:52 PM

## 2024-09-12 ENCOUNTER — Encounter (HOSPITAL_BASED_OUTPATIENT_CLINIC_OR_DEPARTMENT_OTHER): Payer: Self-pay | Admitting: Physical Therapy

## 2024-09-17 DIAGNOSIS — Z23 Encounter for immunization: Secondary | ICD-10-CM | POA: Diagnosis not present

## 2024-09-18 ENCOUNTER — Ambulatory Visit (HOSPITAL_BASED_OUTPATIENT_CLINIC_OR_DEPARTMENT_OTHER): Attending: Family Medicine | Admitting: Physical Therapy

## 2024-09-18 DIAGNOSIS — R293 Abnormal posture: Secondary | ICD-10-CM | POA: Diagnosis not present

## 2024-09-18 DIAGNOSIS — M545 Low back pain, unspecified: Secondary | ICD-10-CM | POA: Insufficient documentation

## 2024-09-18 DIAGNOSIS — M542 Cervicalgia: Secondary | ICD-10-CM | POA: Insufficient documentation

## 2024-09-18 DIAGNOSIS — R2689 Other abnormalities of gait and mobility: Secondary | ICD-10-CM | POA: Insufficient documentation

## 2024-09-18 DIAGNOSIS — G8929 Other chronic pain: Secondary | ICD-10-CM | POA: Diagnosis not present

## 2024-09-18 DIAGNOSIS — M5459 Other low back pain: Secondary | ICD-10-CM | POA: Diagnosis not present

## 2024-09-18 NOTE — Therapy (Signed)
 OUTPATIENT PHYSICAL THERAPY THORACOLUMBAR EVALUATION   Patient Name: [name] MRN: [id number] DOB:07/04/1946, 78 y.o., male Today's Date: 09/19/2024  END OF SESSION:  PT End of Session - 09/18/24 1045     Visit Number 5    Number of Visits 24    Date for Recertification  11/04/24    Authorization Type 24    PT Start Time 1015    PT Stop Time 1058    PT Time Calculation (min) 43 min    Activity Tolerance Patient tolerated treatment well    Behavior During Therapy WFL for tasks assessed/performed            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   Paroxysmal atrial flutter (HCC)    Past Surgical History:  Procedure Laterality Date   ABDOMINAL EXPOSURE N/A 01/06/2020   Procedure: ABDOMINAL EXPOSURE;  Surgeon: Oris Krystal FALCON, MD;  Location: [facility] OR;  Service: Vascular;  Laterality: N/A;   ANTERIOR LAT LUMBAR FUSION N/A 06/18/2024   Procedure: ANTERIOR LATERAL LUMBAR FUSION LUMBAR ONE-TWO, LUMBAR TWO-THREE;  Surgeon: Colon Shove, MD;  Location: [facility] OR;  Service: Neurosurgery;  Laterality: N/A;   ANTERIOR LUMBAR FUSION N/A 01/06/2020   Procedure: Lumbar Five- Sacral One  Anterior lumbar interbody fusion with infuse;  Surgeon: Colon Shove, MD;  Location: [facility] OR;  Service: Neurosurgery;  Laterality: N/A;  Lumbar Five- Sacral One  Anterior lumbar interbody fusion with infuse   CERVICAL FUSION  1996, 1997   2 surgery- skull to C7   COLON RESECTION  2007   COLONOSCOPY W/ POLYPECTOMY     CORONARY ATHERECTOMY N/A 07/01/2021    Procedure: CORONARY ATHERECTOMY;  Surgeon: Verlin Lonni BIRCH, MD;  Location: [facility];  Service: Cardiovascular;  Laterality: N/A;   CORONARY PRESSURE/FFR STUDY N/A 06/18/2021   Procedure: INTRAVASCULAR PRESSURE WIRE/FFR STUDY;  Surgeon: Verlin Lonni BIRCH, MD;  Location: [facility];  Service: Cardiovascular;  Laterality: N/A;   CORONARY STENT INTERVENTION N/A 07/01/2021   Procedure: CORONARY STENT INTERVENTION;  Surgeon: Verlin Lonni BIRCH, MD;  Location: [facility];  Service: Cardiovascular;  Laterality: N/A;   CORONARY ULTRASOUND/IVUS N/A 07/01/2021   Procedure: Intravascular Ultrasound/IVUS;  Surgeon: Verlin Lonni BIRCH, MD;  Location: [facility];  Service: Cardiovascular;  Laterality: N/A;   HEMORRHOID SURGERY N/A 01/13/2016   Procedure: HEMORRHOIDECTOMY AND REMOVAL OF ANAL SKIN TAG;  Surgeon: Vicenta Poli, MD;  Location: [facility] OR;  Service: General;  Laterality: N/A;   LUMBAR LAMINECTOMY/DECOMPRESSION MICRODISCECTOMY N/A 07/01/2024   Procedure: LAMINECTOMY AND FORAMINOTOMY LUMBAR ONE, TWO, LUMBAR TWO, THREE LEFT;  Surgeon: Colon Shove, MD;  Location: [facility] OR;  Service: Neurosurgery;  Laterality: N/A;  LEFT L1-L2, L2-L3 LAMINOTOMY AND FORAMINOTOMY   LUMBAR PERCUTANEOUS PEDICLE SCREW 2 LEVEL N/A 06/18/2024   Procedure: LUMBAR PERCUTANEOUS PEDICLE SCREW LUMBAR ONE-THREE;  Surgeon: Colon Shove, MD;  Location: [facility] OR;  Service: Neurosurgery;  Laterality: N/A;   REPLACEMENT TOTAL KNEE Left 2005  left   RIGHT/LEFT HEART CATH AND CORONARY ANGIOGRAPHY N/A 06/18/2021   Procedure: RIGHT/LEFT HEART CATH AND CORONARY ANGIOGRAPHY;  Surgeon: Verlin Lonni BIRCH, MD;  Location: [facility];  Service: Cardiovascular;  Laterality: N/A;   SYNOVIAL CYST EXCISION     lumbar spine   TONSILLECTOMY     TOTAL KNEE ARTHROPLASTY Right 01/06/2017   Procedure: RIGHT TOTAL KNEE ARTHROPLASTY;  Surgeon: Lamar Collet, MD;  Location: [facility];  Service: Orthopedics;   Laterality: Right;  Adductior Block   Patient Active Problem List   Diagnosis Date Noted   Post-op pain 06/30/2024   Lumbar stenosis with neurogenic claudication 06/18/2024   Pure hypercholesterolemia 06/20/2023   Degenerative cervical disc 07/07/2021   Coronary artery disease involving native coronary artery of native heart with unstable angina pectoris (HCC)    CAD (coronary artery disease)    Pseudoarthrosis of lumbar spine 01/06/2020   Coronary artery calcification seen on CT scan 01/29/2019   Change in bowel habits 06/12/2018   Primary osteoarthritis of right knee 01/06/2017   S/P knee replacement 01/06/2017   Diplopia 07/10/2013   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   Sore throat 07/26/2011   DIZZINESS 09/10/2009   ATRIAL FLUTTER, PAROXYSMAL 07/09/2009   Dyslipidemia 07/07/2009   Essential hypertension 07/07/2009    PCP: Ardell Manly MD  REFERRING PROVIDER: Victory Gens MD  REFERRING DIAG:  Diagnosis  M48.061 (ICD-10-CM) - Spinal stenosis, lumbar region without neurogenic claudication    Rationale for Evaluation and Treatment: Rehabilitation  THERAPY DIAG:  Other low back pain  Other abnormalities of gait and mobility  Abnormal posture  ONSET DATE: first surgery 7/1  2nd 7/14    SUBJECTIVE:                                                                                                                                                                                           SUBJECTIVE STATEMENT: The patient was able to do 10 min on the Nu-step and 5 on the elliptical. He reports he still is having numbness when he extends fully.   Patient is a 78 year old male with extensive lumbar spine surgery history.  He has had a complete neck fusion in the past.  He also had a fusion from L3-L5.  We recently had L1-L3 lumbar fusion.  He had complications with the surgery and had to have a second surgery.  Since that point he has had significant weakness in his  legs.  He has numbness into his legs.  He also has pain into his groin.  He is having some difficulty with bowel and bladder.  He is able to  control it but needs to go to the bathroom every 2-3 hours to try.  He does report this is improving.  He has been using a walker for primary mobility.  He reports over the past few weeks his ability to use the walker is improved significantly.  He is having some difficulty standing up from lower chairs.  He has significant pain at night.  He has difficulty lying in bed.    PERTINENT HISTORY:  C1 through C7 fusion, 8 surgeries on low back and c-spine , L3-S1 fusion done twice. New L1-L3 fusion, Ocular motor nerve injury, CAD   PAIN:  Are you having pain? Yes: NPRS scale: Can reach an 8 out of 10 at night Pain location: Lumbar spine and radiating into his groin Pain description: Aching Aggravating factors: Different positions and nighttime Relieving factors: Changing positions  PRECAUTIONS: Fall  RED FLAGS: Bowel and Bladder dysfunction   WEIGHT BEARING RESTRICTIONS:  WBAT   FALLS:  Has patient fallen in last 6 months? No  LIVING ENVIRONMENT: 2 steps into the house  OCCUPATION:  Retired   Presenter, broadcasting: goes to the gym   PLOF: Independent  PATIENT GOALS:  To walk better   NEXT MD VISIT:  Nothing scheduled   OBJECTIVE:  Note: Objective measures were completed at Evaluation unless otherwise noted.  DIAGNOSTIC FINDINGS:  Post op CT angioplasty:  IMPRESSION: No suspicious finding to suggest pulmonary embolism.   Subsegmental atelectasis/consolidation of lingula.   Scattered pulmonary nodules up to 6 mm. No follow-up needed if patient is low-risk (and has no known or suspected primary neoplasm). Non-contrast chest CT can be considered in 12 months if patient is high-risk. This recommendation follows the consensus statement: Guidelines for Management of Incidental Pulmonary Nodules Detected on CT Images: From the Fleischner Society 2017;  Radiology 2017; 284:228-243.   Atherosclerotic calcifications of aorta and coronary arteries.  PATIENT SURVEYS:  LEFS  Extreme difficulty/unable (0), Quite a bit of difficulty (1), Moderate difficulty (2), Little difficulty (3), No difficulty (4) Survey date:    Any of your usual work, housework or school activities   2. Usual hobbies, recreational or sporting activities   3. Getting into/out of the bath   4. Walking between rooms   5. Putting on socks/shoes   6. Squatting    7. Lifting an object, like a bag of groceries from the floor   8. Performing light activities around your home   9. Performing heavy activities around your home   10. Getting into/out of a car   11. Walking 2 blocks   12. Walking 1 mile   13. Going up/down 10 stairs (1 flight)   14. Standing for 1 hour   15.  sitting for 1 hour   16. Running on even ground   17. Running on uneven ground   18. Making sharp turns while running fast   19. Hopping    20. Rolling over in bed   Score total:  11/80      COGNITION: Overall cognitive status: Within functional limits for tasks assessed     SENSATION: Numbness into both legs   POSTURE:  Forward head  Rounded shoulder  Stands in flexed posture   PALPATION: Significant tightness around L1-L4 bilateral Tightness extending into thoracic spine   LUMBAR ROM:   AROM eval  Flexion   Extension Pain extending to neutral   Right lateral flexion   Left lateral flexion   Right rotation 50% limited   Left rotation 50% limited    (  Blank rows = not tested)  LOWER EXTREMITY ROM:    Not tested today   LOWER EXTREMITY MMT:    MMT Right eval Left eval  Hip flexion 23.8 32.0  Hip extension    Hip abduction 36.0 50.2  Hip adduction    Hip internal rotation    Hip external rotation    Knee flexion    Knee extension 20.1 22.0  Ankle dorsiflexion    Ankle plantarflexion    Ankle inversion    Ankle eversion     (Blank rows = not  tested)     FUNCTIONAL TESTS:  5x sit to stand 36 seconds   GAIT: Using walker.  Flexed position Decreased hip flexion    TREATMENT DATE:  10/1 Manual:  Trigger point release to lumbar spine and guteals  Reviewed self soft tissue mobilization  LAD grade II and III 2x10   There-ex:  Ball roll out x10 5 sec hold fwd and lateral   Neuro-re-ed:  Spent time talking about the importance of working around the back but not over extending th back with exercises.   Row [phone] with cuing to keep chest on chest plate 50 lbs. Discussed RPE.   9/25 Manual:  Trigger point release to lumbar spine and guteals  Reviewed self soft tissue mobilization  LAD grade II and III 2x10   There-ex:  Ball roll out x10 5 sec hold fwd and lateral   Neuro-re-ed:  Wall posture 5x 10 sec holds with cuing not to force his posture   9/16  Manual:  Trigger point release to lumbar spine and guteals  Reviewed self soft tissue mobilization   Neuro-re -ed:  Standing biceps curl with cuing for posture   3 lbs 3x12 with seated rest break in between.  2lb punch 3x12 with cuing   Seated march with cueing for posture 3x12 blue   Seated hip abduction with cuing for posture 3x12 blue   There-ex: nu-step with cuing   9/8 Manual:  Trigger point release to lumbar spine and guteals   There-ex: Ball roll out fwd and lateral x10 5 sec hold at end    Neuro-re-ed:   Row 3x10 with cuing for posture and abdominal bracing   Shoulder extension with cuing for abdominal bracing 3x10 red   Seated rest breaks as needed.     8/25                                                                                                                               There-ex:  Bilateral hip abduction 2x10 red  Hip flexion 2x10 red   Manual:  Trigger point release to lumbar and thoracic spine  Review of trigger points with the patient  PATIENT EDUCATION:  Education details: HEP, symptom management  Person  educated: Patient Education method: Explanation, Demonstration, Tactile cues, Verbal cues, and Handouts Education comprehension: verbalized understanding, returned demonstration, verbal cues required, tactile cues required, and needs  further education  HOME EXERCISE PROGRAM: Access Code: [email or web address] URL: [email or web address] Date: 08/12/2024 Prepared by: Alm Don  Exercises - Seated Knee Extension with Resistance  - 1 x daily - 7 x weekly - 3 sets - 10 reps - Seated March with Resistance  - 1 x daily - 7 x weekly - 3 sets - 10 reps - Seated Hip Abduction with Resistance  - 1 x daily - 7 x weekly - 3 sets - 10 reps  ASSESSMENT:  CLINICAL IMPRESSION: Patient has had significant reduction trigger points in his lumbar spine.  He has also been increasing amount of exercise he is able to do at the gym.  He has mostly been focusing on lower extremity exercises.  Therapy advised him to continue to keep his weight light.  We discussed how to use RPE to grade weights.  We also discussed not overextending his back with upper extremity exercises.  Will continue to review these principles.  We discussed the anatomy of the spine with the patient the stress he is going to put on it if he overextends his back from performing there ex.  Therapy will continue to progress as tolerated. Eval: Patient is a 78 year old male with a past history significant for several spine surgeries including a complete cervical fusion and a complete lumbar spine fusion.  His most recent surgery resulted in significant weakness of bilateral lower extremities. He also has numbness into his legs. He has spamsing in bilateral lumbar paraspinals extending into his thoracic paraspinals. He is having difficulty walking household distances. He would benefit from killed therapy to return to active lifestyle.  OBJECTIVE IMPAIRMENTS: Abnormal gait, decreased activity tolerance, decreased endurance, decreased knowledge of  condition, decreased knowledge of use of DME, decreased ROM, decreased strength, increased fascial restrictions, increased muscle spasms, postural dysfunction, and pain.   ACTIVITY LIMITATIONS: carrying, lifting, sitting, standing, squatting, sleeping, stairs, bed mobility, continence, and locomotion level  PARTICIPATION LIMITATIONS: meal prep, cleaning, laundry, driving, shopping, community activity, and yard work  PERSONAL FACTORS: Past/current experiences and 3+ comorbidities: cervical fusion, past back fusion, current anemia  are also affecting patient's functional outcome.   REHAB POTENTIAL: Good  CLINICAL DECISION MAKING: Unstable/unpredictable numbness in both legs/ significant pain into the groin   EVALUATION COMPLEXITY: High   GOALS: Goals reviewed with patient? Yes  SHORT TERM GOALS: Target date: 09/23/2024    Patient will cone to neutral extension in standing without pain  Baseline: Goal status: INITIAL  2.  Patient will increase gross right LE strength by 5 lbs  Baseline:  Goal status: INITIAL  3.  Patient will ambulate in his household with LRAD without significant fatigue  Baseline:  Goal status: INITIAL  4.  Patient will be independent with base HEP. Int he gym and pool  Baseline:  Goal status: INITIAL   LONG TERM GOALS: Target date: 11/04/2024     Patient will sleep through the night without significant pain Baseline:  Goal status: INITIAL  2.  Patient will ambulate community distances without increased pain  Baseline:  Goal status: INITIAL  3.  Patient will go up and down 8 steps with a reciprocal gait pattern  Baseline:  Goal status: INITIAL   PLAN:  PT FREQUENCY: 2x/week  PT DURATION: 8 weeks  PLANNED INTERVENTIONS:[id number]-Therapeutic exercises, 97530- Therapeutic activity, [id number]- Neuromuscular re-education, 97535- Self Care, [id number]- Manual therapy, [id number]- Gait training, [phone]- Aquatic Therapy, [id number]- Electrical stimulation (unattended),  97035- Ultrasound, Patient/Family education, Stair training, Taping, Dry Needling, DME instructions, Cryotherapy, and Moist  heat .  PLAN FOR NEXT SESSION: Work on Insurance underwriter on the nu-step  Sit to stand training General strengthening Progress to standing exercises as tolerated. Keep 10 lb lifting restriction until cleared by MD    Alm JINNY Don, PT 09/19/2024, 8:23 AM

## 2024-09-19 ENCOUNTER — Encounter (HOSPITAL_BASED_OUTPATIENT_CLINIC_OR_DEPARTMENT_OTHER): Payer: Self-pay | Admitting: Physical Therapy

## 2024-09-19 DIAGNOSIS — L570 Actinic keratosis: Secondary | ICD-10-CM | POA: Diagnosis not present

## 2024-09-19 DIAGNOSIS — L821 Other seborrheic keratosis: Secondary | ICD-10-CM | POA: Diagnosis not present

## 2024-09-19 DIAGNOSIS — D492 Neoplasm of unspecified behavior of bone, soft tissue, and skin: Secondary | ICD-10-CM | POA: Diagnosis not present

## 2024-09-19 DIAGNOSIS — L814 Other melanin hyperpigmentation: Secondary | ICD-10-CM | POA: Diagnosis not present

## 2024-09-19 DIAGNOSIS — C44729 Squamous cell carcinoma of skin of left lower limb, including hip: Secondary | ICD-10-CM | POA: Diagnosis not present

## 2024-09-19 DIAGNOSIS — D225 Melanocytic nevi of trunk: Secondary | ICD-10-CM | POA: Diagnosis not present

## 2024-09-19 DIAGNOSIS — Z08 Encounter for follow-up examination after completed treatment for malignant neoplasm: Secondary | ICD-10-CM | POA: Diagnosis not present

## 2024-09-24 ENCOUNTER — Ambulatory Visit (HOSPITAL_BASED_OUTPATIENT_CLINIC_OR_DEPARTMENT_OTHER): Admitting: Physical Therapy

## 2024-09-24 ENCOUNTER — Encounter (HOSPITAL_BASED_OUTPATIENT_CLINIC_OR_DEPARTMENT_OTHER): Payer: Self-pay | Admitting: Physical Therapy

## 2024-09-24 DIAGNOSIS — M542 Cervicalgia: Secondary | ICD-10-CM | POA: Diagnosis not present

## 2024-09-24 DIAGNOSIS — M5459 Other low back pain: Secondary | ICD-10-CM

## 2024-09-24 DIAGNOSIS — R293 Abnormal posture: Secondary | ICD-10-CM

## 2024-09-24 DIAGNOSIS — M545 Low back pain, unspecified: Secondary | ICD-10-CM | POA: Diagnosis not present

## 2024-09-24 DIAGNOSIS — R2689 Other abnormalities of gait and mobility: Secondary | ICD-10-CM | POA: Diagnosis not present

## 2024-09-24 DIAGNOSIS — G8929 Other chronic pain: Secondary | ICD-10-CM | POA: Diagnosis not present

## 2024-09-24 NOTE — Therapy (Signed)
 OUTPATIENT PHYSICAL THERAPY THORACOLUMBAR EVALUATION   Patient Name: [name] MRN: [id number] DOB:January 12, 1946, 78 y.o., male Today's Date: 09/24/2024  END OF SESSION:  PT End of Session - 09/24/24 1549     Visit Number 6    Number of Visits 24    Date for Recertification  11/04/24    Authorization Type 24    PT Start Time 1018    PT Stop Time 1058    PT Time Calculation (min) 40 min    Activity Tolerance Patient tolerated treatment well    Behavior During Therapy WFL for tasks assessed/performed            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   Paroxysmal atrial flutter (HCC)    Past Surgical History:  Procedure Laterality Date   ABDOMINAL EXPOSURE N/A 01/06/2020   Procedure: ABDOMINAL EXPOSURE;  Surgeon: Oris Krystal FALCON, MD;  Location: [facility] OR;  Service: Vascular;  Laterality: N/A;   ANTERIOR LAT LUMBAR FUSION N/A 06/18/2024   Procedure: ANTERIOR LATERAL LUMBAR FUSION LUMBAR ONE-TWO, LUMBAR TWO-THREE;  Surgeon: Colon Shove, MD;  Location: [facility] OR;  Service: Neurosurgery;  Laterality: N/A;   ANTERIOR LUMBAR FUSION N/A 01/06/2020   Procedure: Lumbar Five- Sacral One  Anterior lumbar interbody fusion with infuse;  Surgeon: Colon Shove, MD;  Location: [facility] OR;  Service: Neurosurgery;  Laterality: N/A;  Lumbar Five- Sacral One  Anterior lumbar interbody fusion with infuse   CERVICAL FUSION  1996, 1997   2 surgery- skull to C7   COLON RESECTION  2007   COLONOSCOPY W/ POLYPECTOMY     CORONARY ATHERECTOMY N/A 07/01/2021    Procedure: CORONARY ATHERECTOMY;  Surgeon: Verlin Lonni BIRCH, MD;  Location: [facility];  Service: Cardiovascular;  Laterality: N/A;   CORONARY PRESSURE/FFR STUDY N/A 06/18/2021   Procedure: INTRAVASCULAR PRESSURE WIRE/FFR STUDY;  Surgeon: Verlin Lonni BIRCH, MD;  Location: [facility];  Service: Cardiovascular;  Laterality: N/A;   CORONARY STENT INTERVENTION N/A 07/01/2021   Procedure: CORONARY STENT INTERVENTION;  Surgeon: Verlin Lonni BIRCH, MD;  Location: [facility];  Service: Cardiovascular;  Laterality: N/A;   CORONARY ULTRASOUND/IVUS N/A 07/01/2021   Procedure: Intravascular Ultrasound/IVUS;  Surgeon: Verlin Lonni BIRCH, MD;  Location: [facility];  Service: Cardiovascular;  Laterality: N/A;   HEMORRHOID SURGERY N/A 01/13/2016   Procedure: HEMORRHOIDECTOMY AND REMOVAL OF ANAL SKIN TAG;  Surgeon: Vicenta Poli, MD;  Location: [facility];  Service: General;  Laterality: N/A;   LUMBAR LAMINECTOMY/DECOMPRESSION MICRODISCECTOMY N/A 07/01/2024   Procedure: LAMINECTOMY AND FORAMINOTOMY LUMBAR ONE, TWO, LUMBAR TWO, THREE LEFT;  Surgeon: Colon Shove, MD;  Location: [facility];  Service: Neurosurgery;  Laterality: N/A;  LEFT L1-L2, L2-L3 LAMINOTOMY AND FORAMINOTOMY   LUMBAR PERCUTANEOUS PEDICLE SCREW 2 LEVEL N/A 06/18/2024   Procedure: LUMBAR PERCUTANEOUS PEDICLE SCREW LUMBAR ONE-THREE;  Surgeon: Colon Shove, MD;  Location: [facility];  Service: Neurosurgery;  Laterality: N/A;   REPLACEMENT TOTAL KNEE Left 2005  left   RIGHT/LEFT HEART CATH AND CORONARY ANGIOGRAPHY N/A 06/18/2021   Procedure: RIGHT/LEFT HEART CATH AND CORONARY ANGIOGRAPHY;  Surgeon: Verlin Lonni BIRCH, MD;  Location: [facility];  Service: Cardiovascular;  Laterality: N/A;   SYNOVIAL CYST EXCISION     lumbar spine   TONSILLECTOMY     TOTAL KNEE ARTHROPLASTY Right 01/06/2017   Procedure: RIGHT TOTAL KNEE ARTHROPLASTY;  Surgeon: Lamar Collet, MD;  Location: [facility];  Service: Orthopedics;   Laterality: Right;  Adductior Block   Patient Active Problem List   Diagnosis Date Noted   Post-op pain 06/30/2024   Lumbar stenosis with neurogenic claudication 06/18/2024   Pure hypercholesterolemia 06/20/2023   Degenerative cervical disc 07/07/2021   Coronary artery disease involving native coronary artery of native heart with unstable angina pectoris (HCC)    CAD (coronary artery disease)    Pseudoarthrosis of lumbar spine 01/06/2020   Coronary artery calcification seen on CT scan 01/29/2019   Change in bowel habits 06/12/2018   Primary osteoarthritis of right knee 01/06/2017   S/P knee replacement 01/06/2017   Diplopia 07/10/2013   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   Sore throat 07/26/2011   DIZZINESS 09/10/2009   ATRIAL FLUTTER, PAROXYSMAL 07/09/2009   Dyslipidemia 07/07/2009   Essential hypertension 07/07/2009    PCP: Ardell Manly MD  REFERRING PROVIDER: Victory Gens MD  REFERRING DIAG:  Diagnosis  M48.061 (ICD-10-CM) - Spinal stenosis, lumbar region without neurogenic claudication    Rationale for Evaluation and Treatment: Rehabilitation  THERAPY DIAG:  Other low back pain  Other abnormalities of gait and mobility  Abnormal posture  ONSET DATE: first surgery 7/1  2nd 7/14    SUBJECTIVE:                                                                                                                                                                                           SUBJECTIVE STATEMENT: The patient continues to work hard at the gym. He still feels like he can't stand straight. He was advised to be patient and persistent.   Patient is a 78 year old male with extensive lumbar spine surgery history.  He has had a complete neck fusion in the past.  He also had a fusion from L3-L5.  We recently had L1-L3 lumbar fusion.  He had complications with the surgery and had to have a second surgery.  Since that point he has had significant weakness in his  legs.  He has numbness into his legs.  He also has pain into his groin.  He is having some difficulty with bowel and bladder.  He is able to control it  but needs to go to the bathroom every 2-3 hours to try.  He does report this is improving.  He has been using a walker for primary mobility.  He reports over the past few weeks his ability to use the walker is improved significantly.  He is having some difficulty standing up from lower chairs.  He has significant pain at night.  He has difficulty lying in bed.    PERTINENT HISTORY:  C1 through C7 fusion, 8 surgeries on low back and c-spine , L3-S1 fusion done twice. New L1-L3 fusion, Ocular motor nerve injury, CAD   PAIN:  Are you having pain? Yes: NPRS scale: Can reach an 8 out of 10 at night Pain location: Lumbar spine and radiating into his groin Pain description: Aching Aggravating factors: Different positions and nighttime Relieving factors: Changing positions  PRECAUTIONS: Fall  RED FLAGS: Bowel and Bladder dysfunction   WEIGHT BEARING RESTRICTIONS:  WBAT   FALLS:  Has patient fallen in last 6 months? No  LIVING ENVIRONMENT: 2 steps into the house  OCCUPATION:  Retired   Presenter, broadcasting: goes to the gym   PLOF: Independent  PATIENT GOALS:  To walk better   NEXT MD VISIT:  Nothing scheduled   OBJECTIVE:  Note: Objective measures were completed at Evaluation unless otherwise noted.  DIAGNOSTIC FINDINGS:  Post op CT angioplasty:  IMPRESSION: No suspicious finding to suggest pulmonary embolism.   Subsegmental atelectasis/consolidation of lingula.   Scattered pulmonary nodules up to 6 mm. No follow-up needed if patient is low-risk (and has no known or suspected primary neoplasm). Non-contrast chest CT can be considered in 12 months if patient is high-risk. This recommendation follows the consensus statement: Guidelines for Management of Incidental Pulmonary Nodules Detected on CT Images: From the Fleischner Society 2017;  Radiology 2017; 284:228-243.   Atherosclerotic calcifications of aorta and coronary arteries.  PATIENT SURVEYS:  LEFS  Extreme difficulty/unable (0), Quite a bit of difficulty (1), Moderate difficulty (2), Little difficulty (3), No difficulty (4) Survey date:    Any of your usual work, housework or school activities   2. Usual hobbies, recreational or sporting activities   3. Getting into/out of the bath   4. Walking between rooms   5. Putting on socks/shoes   6. Squatting    7. Lifting an object, like a bag of groceries from the floor   8. Performing light activities around your home   9. Performing heavy activities around your home   10. Getting into/out of a car   11. Walking 2 blocks   12. Walking 1 mile   13. Going up/down 10 stairs (1 flight)   14. Standing for 1 hour   15.  sitting for 1 hour   16. Running on even ground   17. Running on uneven ground   18. Making sharp turns while running fast   19. Hopping    20. Rolling over in bed   Score total:  11/80      COGNITION: Overall cognitive status: Within functional limits for tasks assessed     SENSATION: Numbness into both legs   POSTURE:  Forward head  Rounded shoulder  Stands in flexed posture   PALPATION: Significant tightness around L1-L4 bilateral Tightness extending into thoracic spine   LUMBAR ROM:   AROM eval  Flexion   Extension Pain extending to neutral   Right lateral flexion   Left lateral flexion   Right rotation 50% limited   Left rotation 50% limited    (  Blank rows = not tested)  LOWER EXTREMITY ROM:    Not tested today   LOWER EXTREMITY MMT:    MMT Right eval Left eval  Hip flexion 23.8 32.0  Hip extension    Hip abduction 36.0 50.2  Hip adduction    Hip internal rotation    Hip external rotation    Knee flexion    Knee extension 20.1 22.0  Ankle dorsiflexion    Ankle plantarflexion    Ankle inversion    Ankle eversion     (Blank rows = not  tested)     FUNCTIONAL TESTS:  5x sit to stand 36 seconds   GAIT: Using walker.  Flexed position Decreased hip flexion    TREATMENT DATE:  10/7 Manual:  Trigger point release to lumbar spine and guteals  Reviewed self soft tissue mobilization  LAD grade II and III 2x10   Neur-re-ed Seated biceps curl with cuing for posture  3 lbs 3x12   2lb punch 3x12 with cuing for posture and breathing   2lb seated shoulder flexion 3x12     10/1 Manual:  Trigger point release to lumbar spine and guteals  Reviewed self soft tissue mobilization  LAD grade II and III 2x10   There-ex:  Ball roll out x10 5 sec hold fwd and lateral   Neuro-re-ed:  Spent time talking about the importance of working around the back but not over extending th back with exercises.   Row [phone] with cuing to keep chest on chest plate 50 lbs. Discussed RPE.   9/25 Manual:  Trigger point release to lumbar spine and guteals  Reviewed self soft tissue mobilization  LAD grade II and III 2x10   There-ex:  Ball roll out x10 5 sec hold fwd and lateral   Neuro-re-ed:  Wall posture 5x 10 sec holds with cuing not to force his posture   9/16  Manual:  Trigger point release to lumbar spine and guteals  Reviewed self soft tissue mobilization   Neuro-re -ed:  Standing biceps curl with cuing for posture   3 lbs 3x12 with seated rest break in between.  2lb punch 3x12 with cuing   Seated march with cueing for posture 3x12 blue   Seated hip abduction with cuing for posture 3x12 blue   There-ex: nu-step with cuing   9/8 Manual:  Trigger point release to lumbar spine and guteals   There-ex: Ball roll out fwd and lateral x10 5 sec hold at end    Neuro-re-ed:   Row 3x10 with cuing for posture and abdominal bracing   Shoulder extension with cuing for abdominal bracing 3x10 red   Seated rest breaks as needed.     8/25                                                                                                                                There-ex:  Bilateral hip abduction 2x10 red  Hip  flexion 2x10 red   Manual:  Trigger point release to lumbar and thoracic spine  Review of trigger points with the patient  PATIENT EDUCATION:  Education details: HEP, symptom management  Person educated: Patient Education method: Explanation, Demonstration, Tactile cues, Verbal cues, and Handouts Education comprehension: verbalized understanding, returned demonstration, verbal cues required, tactile cues required, and needs further education  HOME EXERCISE PROGRAM: Access Code: [email or web address] URL: [email or web address] Date: 08/12/2024 Prepared by: Alm Don  Exercises - Seated Knee Extension with Resistance  - 1 x daily - 7 x weekly - 3 sets - 10 reps - Seated March with Resistance  - 1 x daily - 7 x weekly - 3 sets - 10 reps - Seated Hip Abduction with Resistance  - 1 x daily - 7 x weekly - 3 sets - 10 reps  ASSESSMENT:  CLINICAL IMPRESSION:  The patient has had some difficulty with the wall posture. Overall his spasming appears to be improving. He was advised to continue with his gym program. Eval: Patient is a 78 year old male with a past history significant for several spine surgeries including a complete cervical fusion and a complete lumbar spine fusion.  His most recent surgery resulted in significant weakness of bilateral lower extremities. He also has numbness into his legs. He has spamsing in bilateral lumbar paraspinals extending into his thoracic paraspinals. He is having difficulty walking household distances. He would benefit from killed therapy to return to active lifestyle.  OBJECTIVE IMPAIRMENTS: Abnormal gait, decreased activity tolerance, decreased endurance, decreased knowledge of condition, decreased knowledge of use of DME, decreased ROM, decreased strength, increased fascial restrictions, increased muscle spasms, postural dysfunction, and pain.    ACTIVITY LIMITATIONS: carrying, lifting, sitting, standing, squatting, sleeping, stairs, bed mobility, continence, and locomotion level  PARTICIPATION LIMITATIONS: meal prep, cleaning, laundry, driving, shopping, community activity, and yard work  PERSONAL FACTORS: Past/current experiences and 3+ comorbidities: cervical fusion, past back fusion, current anemia  are also affecting patient's functional outcome.   REHAB POTENTIAL: Good  CLINICAL DECISION MAKING: Unstable/unpredictable numbness in both legs/ significant pain into the groin   EVALUATION COMPLEXITY: High   GOALS: Goals reviewed with patient? Yes  SHORT TERM GOALS: Target date: 09/23/2024    Patient will cone to neutral extension in standing without pain  Baseline: Goal status: INITIAL  2.  Patient will increase gross right LE strength by 5 lbs  Baseline:  Goal status: INITIAL  3.  Patient will ambulate in his household with LRAD without significant fatigue  Baseline:  Goal status: INITIAL  4.  Patient will be independent with base HEP. Int he gym and pool  Baseline:  Goal status: INITIAL   LONG TERM GOALS: Target date: 11/04/2024     Patient will sleep through the night without significant pain Baseline:  Goal status: INITIAL  2.  Patient will ambulate community distances without increased pain  Baseline:  Goal status: INITIAL  3.  Patient will go up and down 8 steps with a reciprocal gait pattern  Baseline:  Goal status: INITIAL   PLAN:  PT FREQUENCY: 2x/week  PT DURATION: 8 weeks  PLANNED INTERVENTIONS:[id number]-Therapeutic exercises, 97530- Therapeutic activity, [id number]- Neuromuscular re-education, 97535- Self Care, [id number]- Manual therapy, [id number]- Gait training, [phone]- Aquatic Therapy, [id number]- Electrical stimulation (unattended), 97035- Ultrasound, Patient/Family education, Stair training, Taping, Dry Needling, DME instructions, Cryotherapy, and Moist heat .  PLAN FOR NEXT SESSION: Work on Automotive engineer on the nu-step  Sit to stand training General strengthening Progress to standing exercises as  tolerated. Keep 10 lb lifting restriction until cleared by MD    Alm JINNY Don, PT 09/24/2024, 3:52 PM

## 2024-09-27 ENCOUNTER — Encounter (HOSPITAL_BASED_OUTPATIENT_CLINIC_OR_DEPARTMENT_OTHER): Admitting: Physical Therapy

## 2024-10-01 ENCOUNTER — Encounter (HOSPITAL_BASED_OUTPATIENT_CLINIC_OR_DEPARTMENT_OTHER): Payer: Self-pay | Admitting: Physical Therapy

## 2024-10-01 ENCOUNTER — Ambulatory Visit (HOSPITAL_BASED_OUTPATIENT_CLINIC_OR_DEPARTMENT_OTHER): Admitting: Physical Therapy

## 2024-10-01 DIAGNOSIS — R2689 Other abnormalities of gait and mobility: Secondary | ICD-10-CM | POA: Diagnosis not present

## 2024-10-01 DIAGNOSIS — R293 Abnormal posture: Secondary | ICD-10-CM

## 2024-10-01 DIAGNOSIS — M5459 Other low back pain: Secondary | ICD-10-CM

## 2024-10-01 DIAGNOSIS — M545 Low back pain, unspecified: Secondary | ICD-10-CM | POA: Diagnosis not present

## 2024-10-01 DIAGNOSIS — M542 Cervicalgia: Secondary | ICD-10-CM | POA: Diagnosis not present

## 2024-10-01 DIAGNOSIS — G8929 Other chronic pain: Secondary | ICD-10-CM | POA: Diagnosis not present

## 2024-10-01 NOTE — Therapy (Unsigned)
 OUTPATIENT PHYSICAL THERAPY THORACOLUMBAR EVALUATION   Patient Name: [name] MRN: [id number] DOB:[birth year], 78 y.o., male Today's Date: 10/01/2024  END OF SESSION:  PT End of Session - 10/01/24 1046     Visit Number 7    Number of Visits 24    Date for Recertification  11/04/24    Authorization Type 24    PT Start Time 1015    PT Stop Time 1058    PT Time Calculation (min) 43 min    Activity Tolerance Patient tolerated treatment well    Behavior During Therapy WFL for tasks assessed/performed            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   Paroxysmal atrial flutter (HCC)    Past Surgical History:  Procedure Laterality Date   ABDOMINAL EXPOSURE N/A 01/06/2020   Procedure: ABDOMINAL EXPOSURE;  Surgeon: Oris Krystal FALCON, MD;  Location: [facility] OR;  Service: Vascular;  Laterality: N/A;   ANTERIOR LAT LUMBAR FUSION N/A 06/18/2024   Procedure: ANTERIOR LATERAL LUMBAR FUSION LUMBAR ONE-TWO, LUMBAR TWO-THREE;  Surgeon: Colon Shove, MD;  Location: [facility] OR;  Service: Neurosurgery;  Laterality: N/A;   ANTERIOR LUMBAR FUSION N/A 01/06/2020   Procedure: Lumbar Five- Sacral One  Anterior lumbar interbody fusion with infuse;  Surgeon: Colon Shove, MD;  Location: [facility] OR;  Service: Neurosurgery;  Laterality: N/A;  Lumbar Five- Sacral One  Anterior lumbar interbody fusion with infuse   CERVICAL FUSION  1996, 1997   2 surgery- skull to C7   COLON RESECTION  2007   COLONOSCOPY W/ POLYPECTOMY     CORONARY ATHERECTOMY N/A 07/01/2021    Procedure: CORONARY ATHERECTOMY;  Surgeon: Verlin Lonni BIRCH, MD;  Location: [facility];  Service: Cardiovascular;  Laterality: N/A;   CORONARY PRESSURE/FFR STUDY N/A 06/18/2021   Procedure: INTRAVASCULAR PRESSURE WIRE/FFR STUDY;  Surgeon: Verlin Lonni BIRCH, MD;  Location: [facility];  Service: Cardiovascular;  Laterality: N/A;   CORONARY STENT INTERVENTION N/A 07/01/2021   Procedure: CORONARY STENT INTERVENTION;  Surgeon: Verlin Lonni BIRCH, MD;  Location: [facility];  Service: Cardiovascular;  Laterality: N/A;   CORONARY ULTRASOUND/IVUS N/A 07/01/2021   Procedure: Intravascular Ultrasound/IVUS;  Surgeon: Verlin Lonni BIRCH, MD;  Location: [facility];  Service: Cardiovascular;  Laterality: N/A;   HEMORRHOID SURGERY N/A 01/13/2016   Procedure: HEMORRHOIDECTOMY AND REMOVAL OF ANAL SKIN TAG;  Surgeon: Vicenta Poli, MD;  Location: [facility] OR;  Service: General;  Laterality: N/A;   LUMBAR LAMINECTOMY/DECOMPRESSION MICRODISCECTOMY N/A 07/01/2024   Procedure: LAMINECTOMY AND FORAMINOTOMY LUMBAR ONE, TWO, LUMBAR TWO, THREE LEFT;  Surgeon: Colon Shove, MD;  Location: [facility] OR;  Service: Neurosurgery;  Laterality: N/A;  LEFT L1-L2, L2-L3 LAMINOTOMY AND FORAMINOTOMY   LUMBAR PERCUTANEOUS PEDICLE SCREW 2 LEVEL N/A 06/18/2024   Procedure: LUMBAR PERCUTANEOUS PEDICLE SCREW LUMBAR ONE-THREE;  Surgeon: Colon Shove, MD;  Location: [facility] OR;  Service: Neurosurgery;  Laterality: N/A;   REPLACEMENT TOTAL KNEE Left 2005  left   RIGHT/LEFT HEART CATH AND CORONARY ANGIOGRAPHY N/A 06/18/2021   Procedure: RIGHT/LEFT HEART CATH AND CORONARY ANGIOGRAPHY;  Surgeon: Verlin Lonni BIRCH, MD;  Location: [facility];  Service: Cardiovascular;  Laterality: N/A;   SYNOVIAL CYST EXCISION     lumbar spine   TONSILLECTOMY     TOTAL KNEE ARTHROPLASTY Right 01/06/2017   Procedure: RIGHT TOTAL KNEE ARTHROPLASTY;  Surgeon: Lamar Collet, MD;  Location: [facility];  Service: Orthopedics;   Laterality: Right;  Adductior Block   Patient Active Problem List   Diagnosis Date Noted   Post-op pain 06/30/2024   Lumbar stenosis with neurogenic claudication 06/18/2024   Pure hypercholesterolemia 06/20/2023   Degenerative cervical disc 07/07/2021   Coronary artery disease involving native coronary artery of native heart with unstable angina pectoris (HCC)    CAD (coronary artery disease)    Pseudoarthrosis of lumbar spine 01/06/2020   Coronary artery calcification seen on CT scan 01/29/2019   Change in bowel habits 06/12/2018   Primary osteoarthritis of right knee 01/06/2017   S/P knee replacement 01/06/2017   Diplopia 07/10/2013   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   Sore throat 07/26/2011   DIZZINESS 09/10/2009   ATRIAL FLUTTER, PAROXYSMAL 07/09/2009   Dyslipidemia 07/07/2009   Essential hypertension 07/07/2009    PCP: Ardell Manly MD  REFERRING PROVIDER: Victory Gens MD  REFERRING DIAG:  Diagnosis  M48.061 (ICD-10-CM) - Spinal stenosis, lumbar region without neurogenic claudication    Rationale for Evaluation and Treatment: Rehabilitation  THERAPY DIAG:  Other low back pain  Other abnormalities of gait and mobility  Abnormal posture  ONSET DATE: first surgery 7/1  2nd 7/14    SUBJECTIVE:                                                                                                                                                                                           SUBJECTIVE STATEMENT: The patient continues to work hard at the gym. He still feels like he can't stand straight. He was advised to be patient and persistent.   Patient is a 78 year old male with extensive lumbar spine surgery history.  He has had a complete neck fusion in the past.  He also had a fusion from L3-L5.  We recently had L1-L3 lumbar fusion.  He had complications with the surgery and had to have a second surgery.  Since that point he has had significant weakness in his  legs.  He has numbness into his legs.  He also has pain into his groin.  He is having some difficulty with bowel and bladder.  He is able to control it  but needs to go to the bathroom every 2-3 hours to try.  He does report this is improving.  He has been using a walker for primary mobility.  He reports over the past few weeks his ability to use the walker is improved significantly.  He is having some difficulty standing up from lower chairs.  He has significant pain at night.  He has difficulty lying in bed.    PERTINENT HISTORY:  C1 through C7 fusion, 8 surgeries on low back and c-spine , L3-S1 fusion done twice. New L1-L3 fusion, Ocular motor nerve injury, CAD   PAIN:  Are you having pain? Yes: NPRS scale: Can reach an 8 out of 10 at night Pain location: Lumbar spine and radiating into his groin Pain description: Aching Aggravating factors: Different positions and nighttime Relieving factors: Changing positions  PRECAUTIONS: Fall  RED FLAGS: Bowel and Bladder dysfunction   WEIGHT BEARING RESTRICTIONS:  WBAT   FALLS:  Has patient fallen in last 6 months? No  LIVING ENVIRONMENT: 2 steps into the house  OCCUPATION:  Retired   Presenter, broadcasting: goes to the gym   PLOF: Independent  PATIENT GOALS:  To walk better   NEXT MD VISIT:  Nothing scheduled   OBJECTIVE:  Note: Objective measures were completed at Evaluation unless otherwise noted.  DIAGNOSTIC FINDINGS:  Post op CT angioplasty:  IMPRESSION: No suspicious finding to suggest pulmonary embolism.   Subsegmental atelectasis/consolidation of lingula.   Scattered pulmonary nodules up to 6 mm. No follow-up needed if patient is low-risk (and has no known or suspected primary neoplasm). Non-contrast chest CT can be considered in 12 months if patient is high-risk. This recommendation follows the consensus statement: Guidelines for Management of Incidental Pulmonary Nodules Detected on CT Images: From the Fleischner Society 2017;  Radiology 2017; 284:228-243.   Atherosclerotic calcifications of aorta and coronary arteries.  PATIENT SURVEYS:  LEFS  Extreme difficulty/unable (0), Quite a bit of difficulty (1), Moderate difficulty (2), Little difficulty (3), No difficulty (4) Survey date:    Any of your usual work, housework or school activities   2. Usual hobbies, recreational or sporting activities   3. Getting into/out of the bath   4. Walking between rooms   5. Putting on socks/shoes   6. Squatting    7. Lifting an object, like a bag of groceries from the floor   8. Performing light activities around your home   9. Performing heavy activities around your home   10. Getting into/out of a car   11. Walking 2 blocks   12. Walking 1 mile   13. Going up/down 10 stairs (1 flight)   14. Standing for 1 hour   15.  sitting for 1 hour   16. Running on even ground   17. Running on uneven ground   18. Making sharp turns while running fast   19. Hopping    20. Rolling over in bed   Score total:  11/80      COGNITION: Overall cognitive status: Within functional limits for tasks assessed     SENSATION: Numbness into both legs   POSTURE:  Forward head  Rounded shoulder  Stands in flexed posture   PALPATION: Significant tightness around L1-L4 bilateral Tightness extending into thoracic spine   LUMBAR ROM:   AROM eval  Flexion   Extension Pain extending to neutral   Right lateral flexion   Left lateral flexion   Right rotation 50% limited   Left rotation 50% limited    (  Blank rows = not tested)  LOWER EXTREMITY ROM:    Not tested today   LOWER EXTREMITY MMT:    MMT Right eval Left eval  Hip flexion 23.8 32.0  Hip extension    Hip abduction 36.0 50.2  Hip adduction    Hip internal rotation    Hip external rotation    Knee flexion    Knee extension 20.1 22.0  Ankle dorsiflexion    Ankle plantarflexion    Ankle inversion    Ankle eversion     (Blank rows = not  tested)     FUNCTIONAL TESTS:  5x sit to stand 36 seconds   GAIT: Using walker.  Flexed position Decreased hip flexion    TREATMENT DATE:  10/14 Manual:  Trigger point release to lumbar spine and guteals  Reviewed self soft tissue mobilization   Nuero re-ed Seated wand flexion 3 x 10 3 pounds Seated wand press 3 x 10 3 pounds Both performed with cueing for upright posture  Standing weight shift with cueing for glutes squeeze and upright posture 3 x 10  There-ex:  Ball roll out x10 5 sec hold fwd and lateral   10/7 Manual:  Trigger point release to lumbar spine and guteals  Reviewed self soft tissue mobilization  LAD grade II and III 2x10   Neur-re-ed Seated biceps curl with cuing for posture  3 lbs 3x12   2lb punch 3x12 with cuing for posture and breathing   2lb seated shoulder flexion 3x12     10/1 Manual:  Trigger point release to lumbar spine and guteals  Reviewed self soft tissue mobilization  LAD grade II and III 2x10   There-ex:  Ball roll out x10 5 sec hold fwd and lateral   Neuro-re-ed:  Spent time talking about the importance of working around the back but not over extending th back with exercises.   Row [phone] with cuing to keep chest on chest plate 50 lbs. Discussed RPE.   9/25 Manual:  Trigger point release to lumbar spine and guteals  Reviewed self soft tissue mobilization  LAD grade II and III 2x10   There-ex:  Ball roll out x10 5 sec hold fwd and lateral   Neuro-re-ed:  Wall posture 5x 10 sec holds with cuing not to force his posture    PATIENT EDUCATION:  Education details: HEP, symptom management  Person educated: Patient Education method: Explanation, Demonstration, Tactile cues, Verbal cues, and Handouts Education comprehension: verbalized understanding, returned demonstration, verbal cues required, tactile cues required, and needs further education  HOME EXERCISE PROGRAM: Access Code: [email or web address] URL:  [email or web address] Date: 08/12/2024 Prepared by: Alm Don  Exercises - Seated Knee Extension with Resistance  - 1 x daily - 7 x weekly - 3 sets - 10 reps - Seated March with Resistance  - 1 x daily - 7 x weekly - 3 sets - 10 reps - Seated Hip Abduction with Resistance  - 1 x daily - 7 x weekly - 3 sets - 10 reps  ASSESSMENT:  CLINICAL IMPRESSION: The patient spasming in lumbar spine continues to improve.  He continues to report difficulty standing straight.  He is advised that this can take time.  He is encouraged to continue with posterior chain strengthening.  He is concerned because when he does his wall posture he cannot put his head against a wall.  He is advised that his neck is fused and he will not likely be able to put his head against a wall.  Therapy will continue to progress as tolerated.  Eval: Patient is a 78 year old male with a past history significant for several spine surgeries including a complete cervical fusion and a complete lumbar spine fusion.  His most recent surgery resulted in significant weakness of bilateral lower extremities. He also has numbness into his legs. He has spamsing in bilateral lumbar paraspinals extending into his thoracic paraspinals. He is having difficulty walking household distances. He would benefit from killed therapy to return to active lifestyle.  OBJECTIVE IMPAIRMENTS: Abnormal gait, decreased activity tolerance, decreased endurance, decreased knowledge of condition, decreased knowledge of use of DME, decreased ROM, decreased strength, increased fascial restrictions, increased muscle spasms, postural dysfunction, and pain.   ACTIVITY LIMITATIONS: carrying, lifting, sitting, standing, squatting, sleeping, stairs, bed mobility, continence, and locomotion level  PARTICIPATION LIMITATIONS: meal prep, cleaning, laundry, driving, shopping, community activity, and yard work  PERSONAL FACTORS: Past/current experiences and 3+  comorbidities: cervical fusion, past back fusion, current anemia  are also affecting patient's functional outcome.   REHAB POTENTIAL: Good  CLINICAL DECISION MAKING: Unstable/unpredictable numbness in both legs/ significant pain into the groin   EVALUATION COMPLEXITY: High   GOALS: Goals reviewed with patient? Yes  SHORT TERM GOALS: Target date: 09/23/2024    Patient will cone to neutral extension in standing without pain  Baseline: Goal status: INITIAL  2.  Patient will increase gross right LE strength by 5 lbs  Baseline:  Goal status: INITIAL  3.  Patient will ambulate in his household with LRAD without significant fatigue  Baseline:  Goal status: INITIAL  4.  Patient will be independent with base HEP. Int he gym and pool  Baseline:  Goal status: INITIAL   LONG TERM GOALS: Target date: 11/04/2024     Patient will sleep through the night without significant pain Baseline:  Goal status: INITIAL  2.  Patient will ambulate community distances without increased pain  Baseline:  Goal status: INITIAL  3.  Patient will go up and down 8 steps with a reciprocal gait pattern  Baseline:  Goal status: INITIAL   PLAN:  PT FREQUENCY: 2x/week  PT DURATION: 8 weeks  PLANNED INTERVENTIONS:[id number]-Therapeutic exercises, 97530- Therapeutic activity, [id number]- Neuromuscular re-education, 97535- Self Care, [id number]- Manual therapy, [id number]- Gait training, [phone]- Aquatic Therapy, [id number]- Electrical stimulation (unattended), 97035- Ultrasound, Patient/Family education, Stair training, Taping, Dry Needling, DME instructions, Cryotherapy, and Moist heat .  PLAN FOR NEXT SESSION: Work on Insurance underwriter on the nu-step  Sit to stand training General strengthening Progress to standing exercises as tolerated. Keep 10 lb lifting restriction until cleared by MD    Alm JINNY Don, PT 10/01/2024, 10:49 AM

## 2024-10-02 ENCOUNTER — Encounter (HOSPITAL_BASED_OUTPATIENT_CLINIC_OR_DEPARTMENT_OTHER): Payer: Self-pay | Admitting: Physical Therapy

## 2024-10-03 ENCOUNTER — Ambulatory Visit (HOSPITAL_BASED_OUTPATIENT_CLINIC_OR_DEPARTMENT_OTHER): Admitting: Physical Therapy

## 2024-10-03 DIAGNOSIS — M5459 Other low back pain: Secondary | ICD-10-CM | POA: Diagnosis not present

## 2024-10-03 DIAGNOSIS — R293 Abnormal posture: Secondary | ICD-10-CM

## 2024-10-03 DIAGNOSIS — M545 Low back pain, unspecified: Secondary | ICD-10-CM | POA: Diagnosis not present

## 2024-10-03 DIAGNOSIS — R2689 Other abnormalities of gait and mobility: Secondary | ICD-10-CM | POA: Diagnosis not present

## 2024-10-03 DIAGNOSIS — M542 Cervicalgia: Secondary | ICD-10-CM | POA: Diagnosis not present

## 2024-10-03 DIAGNOSIS — G8929 Other chronic pain: Secondary | ICD-10-CM

## 2024-10-04 ENCOUNTER — Encounter (HOSPITAL_BASED_OUTPATIENT_CLINIC_OR_DEPARTMENT_OTHER): Payer: Self-pay | Admitting: Physical Therapy

## 2024-10-04 DIAGNOSIS — C44729 Squamous cell carcinoma of skin of left lower limb, including hip: Secondary | ICD-10-CM | POA: Diagnosis not present

## 2024-10-04 NOTE — Therapy (Signed)
 OUTPATIENT PHYSICAL THERAPY THORACOLUMBAR EVALUATION   Patient Name: [name] MRN: [id number] DOB:1946-04-10, 78 y.o., male Today's Date: 10/04/2024  END OF SESSION:  PT End of Session - 10/04/24 1159     Visit Number 8    Number of Visits 24    Date for Recertification  11/04/24    PT Start Time 1015    PT Stop Time 1057    PT Time Calculation (min) 42 min    Activity Tolerance Patient tolerated treatment well    Behavior During Therapy [facility] for tasks assessed/performed            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   Paroxysmal atrial flutter (HCC)    Past Surgical History:  Procedure Laterality Date   ABDOMINAL EXPOSURE N/A 01/06/2020   Procedure: ABDOMINAL EXPOSURE;  Surgeon: Oris Krystal FALCON, MD;  Location: [facility] OR;  Service: Vascular;  Laterality: N/A;   ANTERIOR LAT LUMBAR FUSION N/A 06/18/2024   Procedure: ANTERIOR LATERAL LUMBAR FUSION LUMBAR ONE-TWO, LUMBAR TWO-THREE;  Surgeon: Colon Shove, MD;  Location: [facility] OR;  Service: Neurosurgery;  Laterality: N/A;   ANTERIOR LUMBAR FUSION N/A 01/06/2020   Procedure: Lumbar Five- Sacral One  Anterior lumbar interbody fusion with infuse;  Surgeon: Colon Shove, MD;  Location: [facility] OR;  Service: Neurosurgery;  Laterality: N/A;  Lumbar Five- Sacral One  Anterior lumbar interbody fusion with infuse   CERVICAL FUSION  1996, 1997   2 surgery- skull to C7   COLON RESECTION  2007   COLONOSCOPY W/ POLYPECTOMY     CORONARY ATHERECTOMY N/A 07/01/2021   Procedure: CORONARY ATHERECTOMY;   Surgeon: Verlin Lonni BIRCH, MD;  Location: [facility];  Service: Cardiovascular;  Laterality: N/A;   CORONARY PRESSURE/FFR STUDY N/A 06/18/2021   Procedure: INTRAVASCULAR PRESSURE WIRE/FFR STUDY;  Surgeon: Verlin Lonni BIRCH, MD;  Location: [facility];  Service: Cardiovascular;  Laterality: N/A;   CORONARY STENT INTERVENTION N/A 07/01/2021   Procedure: CORONARY STENT INTERVENTION;  Surgeon: Verlin Lonni BIRCH, MD;  Location: [facility];  Service: Cardiovascular;  Laterality: N/A;   CORONARY ULTRASOUND/IVUS N/A 07/01/2021   Procedure: Intravascular Ultrasound/IVUS;  Surgeon: Verlin Lonni BIRCH, MD;  Location: [facility];  Service: Cardiovascular;  Laterality: N/A;   HEMORRHOID SURGERY N/A 01/13/2016   Procedure: HEMORRHOIDECTOMY AND REMOVAL OF ANAL SKIN TAG;  Surgeon: Vicenta Poli, MD;  Location: [facility] OR;  Service: General;  Laterality: N/A;   LUMBAR LAMINECTOMY/DECOMPRESSION MICRODISCECTOMY N/A 07/01/2024   Procedure: LAMINECTOMY AND FORAMINOTOMY LUMBAR ONE, TWO, LUMBAR TWO, THREE LEFT;  Surgeon: Colon Shove, MD;  Location: [facility] OR;  Service: Neurosurgery;  Laterality: N/A;  LEFT L1-L2, L2-L3 LAMINOTOMY AND FORAMINOTOMY   LUMBAR PERCUTANEOUS PEDICLE SCREW 2 LEVEL N/A 06/18/2024   Procedure: LUMBAR PERCUTANEOUS PEDICLE SCREW LUMBAR ONE-THREE;  Surgeon: Colon Shove, MD;  Location: [facility] OR;  Service: Neurosurgery;  Laterality: N/A;   REPLACEMENT TOTAL KNEE Left 2005   left   RIGHT/LEFT HEART  CATH AND CORONARY ANGIOGRAPHY N/A 06/18/2021   Procedure: RIGHT/LEFT HEART CATH AND CORONARY ANGIOGRAPHY;  Surgeon: Verlin Lonni BIRCH, MD;  Location: [facility];  Service: Cardiovascular;  Laterality: N/A;   SYNOVIAL CYST EXCISION     lumbar spine   TONSILLECTOMY     TOTAL KNEE ARTHROPLASTY Right 01/06/2017   Procedure: RIGHT TOTAL KNEE ARTHROPLASTY;  Surgeon: Lamar Collet, MD;  Location: [facility];  Service: Orthopedics;  Laterality: Right;  Adductior  Block   Patient Active Problem List   Diagnosis Date Noted   Post-op pain 06/30/2024   Lumbar stenosis with neurogenic claudication 06/18/2024   Pure hypercholesterolemia 06/20/2023   Degenerative cervical disc 07/07/2021   Coronary artery disease involving native coronary artery of native heart with unstable angina pectoris (HCC)    CAD (coronary artery disease)    Pseudoarthrosis of lumbar spine 01/06/2020   Coronary artery calcification seen on CT scan 01/29/2019   Change in bowel habits 06/12/2018   Primary osteoarthritis of right knee 01/06/2017   S/P knee replacement 01/06/2017   Diplopia 07/10/2013   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   Sore throat 07/26/2011   DIZZINESS 09/10/2009   ATRIAL FLUTTER, PAROXYSMAL 07/09/2009   Dyslipidemia 07/07/2009   Essential hypertension 07/07/2009    PCP: Ardell Manly MD  REFERRING PROVIDER: Victory Gens MD  REFERRING DIAG:  Diagnosis  M48.061 (ICD-10-CM) - Spinal stenosis, lumbar region without neurogenic claudication    Rationale for Evaluation and Treatment: Rehabilitation  THERAPY DIAG:  Other low back pain  Other abnormalities of gait and mobility  Abnormal posture  Chronic bilateral low back pain without sciatica  ONSET DATE: first surgery 7/1  2nd 7/14    SUBJECTIVE:                                                                                                                                                                                           SUBJECTIVE STATEMENT: The patient reports he has been working at the gym already today. He tolerated well. He has been able to advance his weights. Patient is a 78 year old male with extensive lumbar spine surgery history.  He has had a complete neck fusion in the past.  He also had a fusion from L3-L5.  We recently had L1-L3 lumbar fusion.  He had complications with the surgery and had to have a second surgery.  Since that point he has had significant weakness in  his legs.  He has numbness into his legs.  He also has pain into his groin.  He is having some difficulty with bowel and bladder.  He is able to control it but  needs to go to the bathroom every 2-3 hours to try.  He does report this is improving.  He has been using a walker for primary mobility.  He reports over the past few weeks his ability to use the walker is improved significantly.  He is having some difficulty standing up from lower chairs.  He has significant pain at night.  He has difficulty lying in bed.    PERTINENT HISTORY:  C1 through C7 fusion, 8 surgeries on low back and c-spine , L3-S1 fusion done twice. New L1-L3 fusion, Ocular motor nerve injury, CAD   PAIN:  Are you having pain? Yes: NPRS scale: Can reach an 8 out of 10 at night Pain location: Lumbar spine and radiating into his groin Pain description: Aching Aggravating factors: Different positions and nighttime Relieving factors: Changing positions  PRECAUTIONS: Fall  RED FLAGS: Bowel and Bladder dysfunction   WEIGHT BEARING RESTRICTIONS:  WBAT   FALLS:  Has patient fallen in last 6 months? No  LIVING ENVIRONMENT: 2 steps into the house  OCCUPATION:  Retired   Presenter, broadcasting: goes to the gym   PLOF: Independent  PATIENT GOALS:  To walk better   NEXT MD VISIT:  Nothing scheduled   OBJECTIVE:  Note: Objective measures were completed at Evaluation unless otherwise noted.  DIAGNOSTIC FINDINGS:  Post op CT angioplasty:  IMPRESSION: No suspicious finding to suggest pulmonary embolism.   Subsegmental atelectasis/consolidation of lingula.   Scattered pulmonary nodules up to 6 mm. No follow-up needed if patient is low-risk (and has no known or suspected primary neoplasm). Non-contrast chest CT can be considered in 12 months if patient is high-risk. This recommendation follows the consensus statement: Guidelines for Management of Incidental Pulmonary Nodules Detected on CT Images: From the Fleischner Society  2017; Radiology 2017; 284:228-243.   Atherosclerotic calcifications of aorta and coronary arteries.  PATIENT SURVEYS:  LEFS  Extreme difficulty/unable (0), Quite a bit of difficulty (1), Moderate difficulty (2), Little difficulty (3), No difficulty (4) Survey date:    Any of your usual work, housework or school activities   2. Usual hobbies, recreational or sporting activities   3. Getting into/out of the bath   4. Walking between rooms   5. Putting on socks/shoes   6. Squatting    7. Lifting an object, like a bag of groceries from the floor   8. Performing light activities around your home   9. Performing heavy activities around your home   10. Getting into/out of a car   11. Walking 2 blocks   12. Walking 1 mile   13. Going up/down 10 stairs (1 flight)   14. Standing for 1 hour   15.  sitting for 1 hour   16. Running on even ground   17. Running on uneven ground   18. Making sharp turns while running fast   19. Hopping    20. Rolling over in bed   Score total:  11/80      COGNITION: Overall cognitive status: Within functional limits for tasks assessed     SENSATION: Numbness into both legs   POSTURE:  Forward head  Rounded shoulder  Stands in flexed posture   PALPATION: Significant tightness around L1-L4 bilateral Tightness extending into thoracic spine   LUMBAR ROM:   AROM eval  Flexion   Extension Pain extending to neutral   Right lateral flexion   Left lateral flexion   Right rotation 50% limited   Left rotation 50% limited    (  Blank rows = not tested)  LOWER EXTREMITY ROM:    Not tested today   LOWER EXTREMITY MMT:    MMT Right eval Left eval  Hip flexion 23.8 32.0  Hip extension    Hip abduction 36.0 50.2  Hip adduction    Hip internal rotation    Hip external rotation    Knee flexion    Knee extension 20.1 22.0  Ankle dorsiflexion    Ankle plantarflexion    Ankle inversion    Ankle eversion     (Blank rows = not  tested)     FUNCTIONAL TESTS:  5x sit to stand 36 seconds   GAIT: Using walker.  Flexed position Decreased hip flexion    TREATMENT DATE:  10/17 Manual:  Trigger point release to lumbar spine and guteals  Reviewed self soft tissue mobilization   Nuero re-ed Seated wand flexion 3 x 10 3 pounds Seated wand press 3 x 10 3 pounds Both performed with cueing for upright posture  Cable  Pallof press 2x10 10 lbs  Chop 2x10 10 lbs      10/14 Manual:  Trigger point release to lumbar spine and guteals  Reviewed self soft tissue mobilization   Nuero re-ed Seated wand flexion 3 x 10 3 pounds Seated wand press 3 x 10 3 pounds Both performed with cueing for upright posture  Standing weight shift with cueing for glutes squeeze and upright posture 3 x 10  There-ex:  Ball roll out x10 5 sec hold fwd and lateral   10/7 Manual:  Trigger point release to lumbar spine and guteals  Reviewed self soft tissue mobilization  LAD grade II and III 2x10   Neur-re-ed Seated biceps curl with cuing for posture  3 lbs 3x12   2lb punch 3x12 with cuing for posture and breathing   2lb seated shoulder flexion 3x12     10/1 Manual:  Trigger point release to lumbar spine and guteals  Reviewed self soft tissue mobilization  LAD grade II and III 2x10   There-ex:  Ball roll out x10 5 sec hold fwd and lateral   Neuro-re-ed:  Spent time talking about the importance of working around the back but not over extending th back with exercises.   Row [phone] with cuing to keep chest on chest plate 50 lbs. Discussed RPE.   9/25 Manual:  Trigger point release to lumbar spine and guteals  Reviewed self soft tissue mobilization  LAD grade II and III 2x10   There-ex:  Ball roll out x10 5 sec hold fwd and lateral   Neuro-re-ed:  Wall posture 5x 10 sec holds with cuing not to force his posture    PATIENT EDUCATION:  Education details: HEP, symptom management  Person educated:  Patient Education method: Explanation, Demonstration, Tactile cues, Verbal cues, and Handouts Education comprehension: verbalized understanding, returned demonstration, verbal cues required, tactile cues required, and needs further education  HOME EXERCISE PROGRAM: Access Code: [email or web address] URL: [email or web address] Date: 08/12/2024 Prepared by: Alm Don  Exercises - Seated Knee Extension with Resistance  - 1 x daily - 7 x weekly - 3 sets - 10 reps - Seated March with Resistance  - 1 x daily - 7 x weekly - 3 sets - 10 reps - Seated Hip Abduction with Resistance  - 1 x daily - 7 x weekly - 3 sets - 10 reps  ASSESSMENT:  CLINICAL IMPRESSION: Therapy reviewed cable exercises with the patient. When he was doing his chops before he was  going almost to the floor. He was advised not to do that. His spasming continues to improve. We continue to work on his technique with his strengthening exercises. Therapy will continue to progress as tolerated.  Eval: Patient is a 78 year old male with a past history significant for several spine surgeries including a complete cervical fusion and a complete lumbar spine fusion.  His most recent surgery resulted in significant weakness of bilateral lower extremities. He also has numbness into his legs. He has spamsing in bilateral lumbar paraspinals extending into his thoracic paraspinals. He is having difficulty walking household distances. He would benefit from killed therapy to return to active lifestyle.  OBJECTIVE IMPAIRMENTS: Abnormal gait, decreased activity tolerance, decreased endurance, decreased knowledge of condition, decreased knowledge of use of DME, decreased ROM, decreased strength, increased fascial restrictions, increased muscle spasms, postural dysfunction, and pain.   ACTIVITY LIMITATIONS: carrying, lifting, sitting, standing, squatting, sleeping, stairs, bed mobility, continence, and locomotion level  PARTICIPATION LIMITATIONS:  meal prep, cleaning, laundry, driving, shopping, community activity, and yard work  PERSONAL FACTORS: Past/current experiences and 3+ comorbidities: cervical fusion, past back fusion, current anemia  are also affecting patient's functional outcome.   REHAB POTENTIAL: Good  CLINICAL DECISION MAKING: Unstable/unpredictable numbness in both legs/ significant pain into the groin   EVALUATION COMPLEXITY: High   GOALS: Goals reviewed with patient? Yes  SHORT TERM GOALS: Target date: 09/23/2024    Patient will cone to neutral extension in standing without pain  Baseline: Goal status:improving but still has numbness at neutral 10/17  2.  Patient will increase gross right LE strength by 5 lbs  Baseline:  Goal status will check next visit.10/17  3.  Patient will ambulate in his household with LRAD without significant fatigue  Baseline:  Goal status: INITIAL  4.  Patient will be independent with base HEP. Int he gym and pool  Baseline:  Goal status: INITIAL   LONG TERM GOALS: Target date: 11/04/2024     Patient will sleep through the night without significant pain Baseline:  Goal status: INITIAL  2.  Patient will ambulate community distances without increased pain  Baseline:  Goal status: INITIAL  3.  Patient will go up and down 8 steps with a reciprocal gait pattern  Baseline:  Goal status: INITIAL   PLAN:  PT FREQUENCY: 2x/week  PT DURATION: 8 weeks  PLANNED INTERVENTIONS:[id number]-Therapeutic exercises, 97530- Therapeutic activity, [id number]- Neuromuscular re-education, 97535- Self Care, [id number]- Manual therapy, [id number]- Gait training, [phone]- Aquatic Therapy, [id number]- Electrical stimulation (unattended), 97035- Ultrasound, Patient/Family education, Stair training, Taping, Dry Needling, DME instructions, Cryotherapy, and Moist heat .  PLAN FOR NEXT SESSION: Work on Insurance underwriter on the nu-step  Sit to stand training General strengthening Progress to  standing exercises as tolerated. Keep 10 lb lifting restriction until cleared by MD    Alm JINNY Don, PT 10/04/2024, 12:56 PM

## 2024-10-08 ENCOUNTER — Ambulatory Visit (HOSPITAL_BASED_OUTPATIENT_CLINIC_OR_DEPARTMENT_OTHER): Admitting: Physical Therapy

## 2024-10-08 DIAGNOSIS — M542 Cervicalgia: Secondary | ICD-10-CM | POA: Diagnosis not present

## 2024-10-08 DIAGNOSIS — R293 Abnormal posture: Secondary | ICD-10-CM | POA: Diagnosis not present

## 2024-10-08 DIAGNOSIS — R2689 Other abnormalities of gait and mobility: Secondary | ICD-10-CM

## 2024-10-08 DIAGNOSIS — M5459 Other low back pain: Secondary | ICD-10-CM

## 2024-10-08 DIAGNOSIS — M545 Low back pain, unspecified: Secondary | ICD-10-CM | POA: Diagnosis not present

## 2024-10-08 DIAGNOSIS — G8929 Other chronic pain: Secondary | ICD-10-CM | POA: Diagnosis not present

## 2024-10-08 NOTE — Therapy (Unsigned)
 OUTPATIENT PHYSICAL THERAPY THORACOLUMBAR EVALUATION   Patient Name: [name] MRN: [id number] DOB:10-01-1946, 78 y.o., male Today's Date: 10/09/2024  END OF SESSION:  PT End of Session - 10/08/24 1057     Visit Number 9    Number of Visits 24    Date for Recertification  11/04/24    Authorization Type 24 progress note next visit!!!    PT Start Time 1015    PT Stop Time 1058    PT Time Calculation (min) 43 min    Activity Tolerance Patient tolerated treatment well    Behavior During Therapy [facility] for tasks assessed/performed            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   Paroxysmal atrial flutter (HCC)    Past Surgical History:  Procedure Laterality Date   ABDOMINAL EXPOSURE N/A 01/06/2020   Procedure: ABDOMINAL EXPOSURE;  Surgeon: Oris Krystal FALCON, MD;  Location: [facility] OR;  Service: Vascular;  Laterality: N/A;   ANTERIOR LAT LUMBAR FUSION N/A 06/18/2024   Procedure: ANTERIOR LATERAL LUMBAR FUSION LUMBAR ONE-TWO, LUMBAR TWO-THREE;  Surgeon: Colon Shove, MD;  Location: [facility] OR;  Service: Neurosurgery;  Laterality: N/A;   ANTERIOR LUMBAR FUSION N/A 01/06/2020   Procedure: Lumbar Five- Sacral One  Anterior lumbar interbody fusion with infuse;  Surgeon: Colon Shove, MD;  Location: [facility] OR;  Service: Neurosurgery;  Laterality: N/A;  Lumbar Five- Sacral One  Anterior lumbar interbody fusion with infuse   CERVICAL FUSION  1996, 1997   2 surgery- skull to C7   COLON RESECTION  2007   COLONOSCOPY W/ POLYPECTOMY     CORONARY  ATHERECTOMY N/A 07/01/2021   Procedure: CORONARY ATHERECTOMY;  Surgeon: Verlin Lonni BIRCH, MD;  Location: [facility];  Service: Cardiovascular;  Laterality: N/A;   CORONARY PRESSURE/FFR STUDY N/A 06/18/2021   Procedure: INTRAVASCULAR PRESSURE WIRE/FFR STUDY;  Surgeon: Verlin Lonni BIRCH, MD;  Location: [facility];  Service: Cardiovascular;  Laterality: N/A;   CORONARY STENT INTERVENTION N/A 07/01/2021   Procedure: CORONARY STENT INTERVENTION;  Surgeon: Verlin Lonni BIRCH, MD;  Location: [facility];  Service: Cardiovascular;  Laterality: N/A;   CORONARY ULTRASOUND/IVUS N/A 07/01/2021   Procedure: Intravascular Ultrasound/IVUS;  Surgeon: Verlin Lonni BIRCH, MD;  Location: [facility];  Service: Cardiovascular;  Laterality: N/A;   HEMORRHOID SURGERY N/A 01/13/2016   Procedure: HEMORRHOIDECTOMY AND REMOVAL OF ANAL SKIN TAG;  Surgeon: Vicenta Poli, MD;  Location: [facility] OR;  Service: General;  Laterality: N/A;   LUMBAR LAMINECTOMY/DECOMPRESSION MICRODISCECTOMY N/A 07/01/2024   Procedure: LAMINECTOMY AND FORAMINOTOMY LUMBAR ONE, TWO, LUMBAR TWO, THREE LEFT;  Surgeon: Colon Shove, MD;  Location: [facility] OR;  Service: Neurosurgery;  Laterality: N/A;  LEFT L1-L2, L2-L3 LAMINOTOMY AND FORAMINOTOMY   LUMBAR PERCUTANEOUS PEDICLE SCREW 2 LEVEL N/A 06/18/2024   Procedure: LUMBAR PERCUTANEOUS PEDICLE SCREW LUMBAR ONE-THREE;  Surgeon: Colon Shove, MD;  Location: [facility] OR;  Service: Neurosurgery;  Laterality: N/A;   REPLACEMENT TOTAL  KNEE Left 2005   left   RIGHT/LEFT HEART CATH AND CORONARY ANGIOGRAPHY N/A 06/18/2021   Procedure: RIGHT/LEFT HEART CATH AND CORONARY ANGIOGRAPHY;  Surgeon: Verlin Lonni BIRCH, MD;  Location: [facility];  Service: Cardiovascular;  Laterality: N/A;   SYNOVIAL CYST EXCISION     lumbar spine   TONSILLECTOMY     TOTAL KNEE ARTHROPLASTY Right 01/06/2017   Procedure: RIGHT TOTAL KNEE ARTHROPLASTY;  Surgeon: Lamar Collet, MD;  Location:  [facility] ORS;  Service: Orthopedics;  Laterality: Right;  Adductior Block   Patient Active Problem List   Diagnosis Date Noted   Post-op pain 06/30/2024   Lumbar stenosis with neurogenic claudication 06/18/2024   Pure hypercholesterolemia 06/20/2023   Degenerative cervical disc 07/07/2021   Coronary artery disease involving native coronary artery of native heart with unstable angina pectoris (HCC)    CAD (coronary artery disease)    Pseudoarthrosis of lumbar spine 01/06/2020   Coronary artery calcification seen on CT scan 01/29/2019   Change in bowel habits 06/12/2018   Primary osteoarthritis of right knee 01/06/2017   S/P knee replacement 01/06/2017   Diplopia 07/10/2013   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   Sore throat 07/26/2011   DIZZINESS 09/10/2009   ATRIAL FLUTTER, PAROXYSMAL 07/09/2009   Dyslipidemia 07/07/2009   Essential hypertension 07/07/2009    PCP: Ardell Manly MD  REFERRING PROVIDER: Victory Gens MD  REFERRING DIAG:  Diagnosis  M48.061 (ICD-10-CM) - Spinal stenosis, lumbar region without neurogenic claudication    Rationale for Evaluation and Treatment: Rehabilitation  THERAPY DIAG:  Other low back pain  Other abnormalities of gait and mobility  Abnormal posture  ONSET DATE: first surgery 7/1  2nd 7/14    SUBJECTIVE:                                                                                                                                                                                           SUBJECTIVE STATEMENT: The patient reports he has been doing maybe a little better. His weights are increasing at the gym. He has been walking somewhat straighter.   Eval:  Patient is a 78 year old male with extensive lumbar spine surgery history.  He has had a complete neck fusion in the past.  He also had a fusion from L3-L5.  We recently had L1-L3 lumbar fusion.  He had complications with the surgery and had to have a second surgery.  Since that point  he has had significant weakness in his legs.  He has numbness into his legs.  He also has pain into his groin.  He is having some difficulty with bowel and bladder.  He is able to control it but needs to go to the bathroom every 2-3 hours to try.  He does report this is improving.  He has been using a walker for primary mobility.  He reports over the past few weeks his ability to use the walker is improved significantly.  He is having some difficulty standing up from lower chairs.  He has significant pain at night.  He has difficulty lying in bed.    PERTINENT HISTORY:  C1 through C7 fusion, 8 surgeries on low back and c-spine , L3-S1 fusion done twice. New L1-L3 fusion, Ocular motor nerve injury, CAD   PAIN:  Are you having pain? Yes: NPRS scale: Can reach an 8 out of 10 at night Pain location: Lumbar spine and radiating into his groin Pain description: Aching Aggravating factors: Different positions and nighttime Relieving factors: Changing positions  PRECAUTIONS: Fall  RED FLAGS: Bowel and Bladder dysfunction   WEIGHT BEARING RESTRICTIONS:  WBAT   FALLS:  Has patient fallen in last 6 months? No  LIVING ENVIRONMENT: 2 steps into the house  OCCUPATION:  Retired   Presenter, broadcasting: goes to the gym   PLOF: Independent  PATIENT GOALS:  To walk better   NEXT MD VISIT:  Nothing scheduled   OBJECTIVE:  Note: Objective measures were completed at Evaluation unless otherwise noted.  DIAGNOSTIC FINDINGS:  Post op CT angioplasty:  IMPRESSION: No suspicious finding to suggest pulmonary embolism.   Subsegmental atelectasis/consolidation of lingula.   Scattered pulmonary nodules up to 6 mm. No follow-up needed if patient is low-risk (and has no known or suspected primary neoplasm). Non-contrast chest CT can be considered in 12 months if patient is high-risk. This recommendation follows the consensus statement: Guidelines for Management of Incidental Pulmonary Nodules Detected on CT  Images: From the Fleischner Society 2017; Radiology 2017; 284:228-243.   Atherosclerotic calcifications of aorta and coronary arteries.  PATIENT SURVEYS:  LEFS  Extreme difficulty/unable (0), Quite a bit of difficulty (1), Moderate difficulty (2), Little difficulty (3), No difficulty (4) Survey date:    Any of your usual work, housework or school activities   2. Usual hobbies, recreational or sporting activities   3. Getting into/out of the bath   4. Walking between rooms   5. Putting on socks/shoes   6. Squatting    7. Lifting an object, like a bag of groceries from the floor   8. Performing light activities around your home   9. Performing heavy activities around your home   10. Getting into/out of a car   11. Walking 2 blocks   12. Walking 1 mile   13. Going up/down 10 stairs (1 flight)   14. Standing for 1 hour   15.  sitting for 1 hour   16. Running on even ground   17. Running on uneven ground   18. Making sharp turns while running fast   19. Hopping    20. Rolling over in bed   Score total:  11/80      COGNITION: Overall cognitive status: Within functional limits for tasks assessed     SENSATION: Numbness into both legs   POSTURE:  Forward head  Rounded shoulder  Stands in flexed posture   PALPATION: Significant tightness around L1-L4 bilateral Tightness extending into thoracic spine   LUMBAR ROM:   AROM eval  Flexion   Extension Pain extending to neutral   Right lateral flexion   Left lateral flexion   Right rotation 50% limited  Left rotation 50% limited    (Blank rows = not tested)  LOWER EXTREMITY ROM:    Not tested today   LOWER EXTREMITY MMT:    MMT Right eval Left eval  Hip flexion 23.8 32.0  Hip extension    Hip abduction 36.0 50.2  Hip adduction    Hip internal rotation    Hip external rotation    Knee flexion    Knee extension 20.1 22.0  Ankle dorsiflexion    Ankle plantarflexion    Ankle inversion    Ankle eversion      (Blank rows = not tested)     FUNCTIONAL TESTS:  5x sit to stand 36 seconds   GAIT: Using walker.  Flexed position Decreased hip flexion    TREATMENT DATE:  10/21 Manual:  Trigger point release to lumbar spine and guteals  Reviewed self soft tissue mobilization  Gross side lying lumbar rotation mobilization   Seated wand flexion 3 x 10 3 pounds Seated wand press 3 x 10 3 pounds Both performed with cueing for upright posture  Tried biceps curls at the walk but back fatigued quickly sat down 3 lbs 3x12   Punches with posture 3x12 2 lbs   10/17 Manual:  Trigger point release to lumbar spine and guteals  Reviewed self soft tissue mobilization   Nuero re-ed Seated wand flexion 3 x 10 3 pounds Seated wand press 3 x 10 3 pounds Both performed with cueing for upright posture  Cable  Pallof press 2x10 10 lbs  Chop 2x10 10 lbs      10/14 Manual:  Trigger point release to lumbar spine and guteals  Reviewed self soft tissue mobilization   Nuero re-ed Seated wand flexion 3 x 10 3 pounds Seated wand press 3 x 10 3 pounds Both performed with cueing for upright posture  Standing weight shift with cueing for glutes squeeze and upright posture 3 x 10  There-ex:  Ball roll out x10 5 sec hold fwd and lateral   10/7 Manual:  Trigger point release to lumbar spine and guteals  Reviewed self soft tissue mobilization  LAD grade II and III 2x10   Neur-re-ed Seated biceps curl with cuing for posture  3 lbs 3x12   2lb punch 3x12 with cuing for posture and breathing   2lb seated shoulder flexion 3x12     10/1 Manual:  Trigger point release to lumbar spine and guteals  Reviewed self soft tissue mobilization  LAD grade II and III 2x10   There-ex:  Ball roll out x10 5 sec hold fwd and lateral   Neuro-re-ed:  Spent time talking about the importance of working around the back but not over extending th back with exercises.   Row [phone] with cuing to keep chest on  chest plate 50 lbs. Discussed RPE.   9/25 Manual:  Trigger point release to lumbar spine and guteals  Reviewed self soft tissue mobilization  LAD grade II and III 2x10   There-ex:  Ball roll out x10 5 sec hold fwd and lateral   Neuro-re-ed:  Wall posture 5x 10 sec holds with cuing not to force his posture    PATIENT EDUCATION:  Education details: HEP, symptom management  Person educated: Patient Education method: Explanation, Demonstration, Tactile cues, Verbal cues, and Handouts Education comprehension: verbalized understanding, returned demonstration, verbal cues required, tactile cues required, and needs further education  HOME EXERCISE PROGRAM: Access Code: [email or web address] URL: [email or web address] Date: 08/12/2024 Prepared by: Alm Don  Exercises -  Seated Knee Extension with Resistance  - 1 x daily - 7 x weekly - 3 sets - 10 reps - Seated March with Resistance  - 1 x daily - 7 x weekly - 3 sets - 10 reps - Seated Hip Abduction with Resistance  - 1 x daily - 7 x weekly - 3 sets - 10 reps  ASSESSMENT:  CLINICAL IMPRESSION: The patient is progressing in the gym,. He feels like he is standing somewhat straighter. He was advised he may always feel slightly bent over because of his neck. He is tolerating exercises well. He can feel it in his back with biceps and punches. He was advised these are functional movements and to keep working them. We will continue to work on progressing him more to standing exercises. We will do a progress note next visit.     Eval: Patient is a 78 year old male with a past history significant for several spine surgeries including a complete cervical fusion and a complete lumbar spine fusion.  His most recent surgery resulted in significant weakness of bilateral lower extremities. He also has numbness into his legs. He has spamsing in bilateral lumbar paraspinals extending into his thoracic paraspinals. He is having difficulty walking  household distances. He would benefit from killed therapy to return to active lifestyle.  OBJECTIVE IMPAIRMENTS: Abnormal gait, decreased activity tolerance, decreased endurance, decreased knowledge of condition, decreased knowledge of use of DME, decreased ROM, decreased strength, increased fascial restrictions, increased muscle spasms, postural dysfunction, and pain.   ACTIVITY LIMITATIONS: carrying, lifting, sitting, standing, squatting, sleeping, stairs, bed mobility, continence, and locomotion level  PARTICIPATION LIMITATIONS: meal prep, cleaning, laundry, driving, shopping, community activity, and yard work  PERSONAL FACTORS: Past/current experiences and 3+ comorbidities: cervical fusion, past back fusion, current anemia  are also affecting patient's functional outcome.   REHAB POTENTIAL: Good  CLINICAL DECISION MAKING: Unstable/unpredictable numbness in both legs/ significant pain into the groin   EVALUATION COMPLEXITY: High   GOALS: Goals reviewed with patient? Yes  SHORT TERM GOALS: Target date: 09/23/2024    Patient will cone to neutral extension in standing without pain  Baseline: Goal status:improving but still has numbness at neutral 10/17  2.  Patient will increase gross right LE strength by 5 lbs  Baseline:  Goal status will check next visit.10/17  3.  Patient will ambulate in his household with LRAD without significant fatigue  Baseline:  Goal status: INITIAL  4.  Patient will be independent with base HEP. Int he gym and pool  Baseline:  Goal status: INITIAL   LONG TERM GOALS: Target date: 11/04/2024     Patient will sleep through the night without significant pain Baseline:  Goal status: INITIAL  2.  Patient will ambulate community distances without increased pain  Baseline:  Goal status: INITIAL  3.  Patient will go up and down 8 steps with a reciprocal gait pattern  Baseline:  Goal status: INITIAL   PLAN:  PT FREQUENCY: 2x/week  PT  DURATION: 8 weeks  PLANNED INTERVENTIONS:[id number]-Therapeutic exercises, 97530- Therapeutic activity, [id number]- Neuromuscular re-education, 97535- Self Care, [id number]- Manual therapy, [id number]- Gait training, [id number]- Aquatic Therapy, [id number]- Electrical stimulation (unattended), 97035- Ultrasound, Patient/Family education, Stair training, Taping, Dry Needling, DME instructions, Cryotherapy, and Moist heat .  PLAN FOR NEXT SESSION: Work on Insurance underwriter on the nu-step  Sit to stand training General strengthening Progress to standing exercises as tolerated. Keep 10 lb lifting restriction until cleared by MD    Alm JINNY Don, PT  10/09/2024, 7:33 AM

## 2024-10-09 ENCOUNTER — Encounter (HOSPITAL_BASED_OUTPATIENT_CLINIC_OR_DEPARTMENT_OTHER): Payer: Self-pay | Admitting: Physical Therapy

## 2024-10-10 ENCOUNTER — Ambulatory Visit (HOSPITAL_BASED_OUTPATIENT_CLINIC_OR_DEPARTMENT_OTHER): Admitting: Physical Therapy

## 2024-10-10 DIAGNOSIS — M5459 Other low back pain: Secondary | ICD-10-CM | POA: Diagnosis not present

## 2024-10-10 DIAGNOSIS — M542 Cervicalgia: Secondary | ICD-10-CM | POA: Diagnosis not present

## 2024-10-10 DIAGNOSIS — G8929 Other chronic pain: Secondary | ICD-10-CM

## 2024-10-10 DIAGNOSIS — R2689 Other abnormalities of gait and mobility: Secondary | ICD-10-CM

## 2024-10-10 DIAGNOSIS — R293 Abnormal posture: Secondary | ICD-10-CM | POA: Diagnosis not present

## 2024-10-10 DIAGNOSIS — M545 Low back pain, unspecified: Secondary | ICD-10-CM | POA: Diagnosis not present

## 2024-10-10 NOTE — Therapy (Signed)
 OUTPATIENT PHYSICAL THERAPY THORACOLUMBAR EVALUATION   Patient Name: [name] MRN: [id number] DOB:21-May-1946, 79 y.o., male Today's Date: 10/11/2024  END OF SESSION:  PT End of Session - 10/10/24 1054     Visit Number 10    Number of Visits 24    Date for Recertification  11/04/24    Authorization Type 24 progress note next visit!!!    PT Start Time 1018    PT Stop Time 1100    PT Time Calculation (min) 42 min    Activity Tolerance Patient tolerated treatment well    Behavior During Therapy [facility] for tasks assessed/performed             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   Paroxysmal atrial flutter (HCC)    Past Surgical History:  Procedure Laterality Date   ABDOMINAL EXPOSURE N/A 01/06/2020   Procedure: ABDOMINAL EXPOSURE;  Surgeon: Oris Krystal FALCON, MD;  Location: [facility] OR;  Service: Vascular;  Laterality: N/A;   ANTERIOR LAT LUMBAR FUSION N/A 06/18/2024   Procedure: ANTERIOR LATERAL LUMBAR FUSION LUMBAR ONE-TWO, LUMBAR TWO-THREE;  Surgeon: Colon Shove, MD;  Location: [facility] OR;  Service: Neurosurgery;  Laterality: N/A;   ANTERIOR LUMBAR FUSION N/A 01/06/2020   Procedure: Lumbar Five- Sacral One  Anterior lumbar interbody fusion with infuse;  Surgeon: Colon Shove, MD;  Location: [facility] OR;  Service: Neurosurgery;  Laterality: N/A;  Lumbar Five- Sacral One  Anterior lumbar interbody fusion with infuse   CERVICAL FUSION  1996, 1997   2 surgery- skull to C7   COLON RESECTION  2007   COLONOSCOPY W/ POLYPECTOMY     CORONARY  ATHERECTOMY N/A 07/01/2021   Procedure: CORONARY ATHERECTOMY;  Surgeon: Verlin Lonni BIRCH, MD;  Location: [facility] IN[facility];  Service: Cardiovascular;  Laterality: N/A;   CORONARY PRESSURE/FFR STUDY N/A 06/18/2021   Procedure: INTRAVASCULAR PRESSURE WIRE/FFR STUDY;  Surgeon: Verlin Lonni BIRCH, MD;  Location: [facility];  Service: Cardiovascular;  Laterality: N/A;   CORONARY STENT INTERVENTION N/A 07/01/2021   Procedure: CORONARY STENT INTERVENTION;  Surgeon: Verlin Lonni BIRCH, MD;  Location: [facility];  Service: Cardiovascular;  Laterality: N/A;   CORONARY ULTRASOUND/IVUS N/A 07/01/2021   Procedure: Intravascular Ultrasound/IVUS;  Surgeon: Verlin Lonni BIRCH, MD;  Location: [facility];  Service: Cardiovascular;  Laterality: N/A;   HEMORRHOID SURGERY N/A 01/13/2016   Procedure: HEMORRHOIDECTOMY AND REMOVAL OF ANAL SKIN TAG;  Surgeon: Vicenta Poli, MD;  Location: [facility] OR;  Service: General;  Laterality: N/A;   LUMBAR LAMINECTOMY/DECOMPRESSION MICRODISCECTOMY N/A 07/01/2024   Procedure: LAMINECTOMY AND FORAMINOTOMY LUMBAR ONE, TWO, LUMBAR TWO, THREE LEFT;  Surgeon: Colon Shove, MD;  Location: [facility] OR;  Service: Neurosurgery;  Laterality: N/A;  LEFT L1-L2, L2-L3 LAMINOTOMY AND FORAMINOTOMY   LUMBAR PERCUTANEOUS PEDICLE SCREW 2 LEVEL N/A 06/18/2024   Procedure: LUMBAR PERCUTANEOUS PEDICLE SCREW LUMBAR ONE-THREE;  Surgeon: Colon Shove, MD;  Location: [facility] OR;  Service: Neurosurgery;  Laterality: N/A;   REPLACEMENT  TOTAL KNEE Left 2005   left   RIGHT/LEFT HEART CATH AND CORONARY ANGIOGRAPHY N/A 06/18/2021   Procedure: RIGHT/LEFT HEART CATH AND CORONARY ANGIOGRAPHY;  Surgeon: Verlin Lonni BIRCH, MD;  Location: [facility];  Service: Cardiovascular;  Laterality: N/A;   SYNOVIAL CYST EXCISION     lumbar spine   TONSILLECTOMY     TOTAL KNEE ARTHROPLASTY Right 01/06/2017   Procedure: RIGHT TOTAL KNEE ARTHROPLASTY;  Surgeon: Lamar Collet, MD;  Location:  [facility] ORS;  Service: Orthopedics;  Laterality: Right;  Adductior Block   Patient Active Problem List   Diagnosis Date Noted   Post-op pain 06/30/2024   Lumbar stenosis with neurogenic claudication 06/18/2024   Pure hypercholesterolemia 06/20/2023   Degenerative cervical disc 07/07/2021   Coronary artery disease involving native coronary artery of native heart with unstable angina pectoris (HCC)    CAD (coronary artery disease)    Pseudoarthrosis of lumbar spine 01/06/2020   Coronary artery calcification seen on CT scan 01/29/2019   Change in bowel habits 06/12/2018   Primary osteoarthritis of right knee 01/06/2017   S/P knee replacement 01/06/2017   Diplopia 07/10/2013   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   Sore throat 07/26/2011   DIZZINESS 09/10/2009   ATRIAL FLUTTER, PAROXYSMAL 07/09/2009   Dyslipidemia 07/07/2009   Essential hypertension 07/07/2009    PCP: Ardell Manly MD  REFERRING PROVIDER: Victory Gens MD  REFERRING DIAG:  Diagnosis  M48.061 (ICD-10-CM) - Spinal stenosis, lumbar region without neurogenic claudication    Rationale for Evaluation and Treatment: Rehabilitation  THERAPY DIAG:  Other low back pain  Other abnormalities of gait and mobility  Abnormal posture  Chronic bilateral low back pain without sciatica  Cervicalgia  ONSET DATE: first surgery 7/1  2nd 7/14    SUBJECTIVE:                                                                                                                                                                                           SUBJECTIVE STATEMENT: The patient has been increasing his weights at the gym. He feels like overall he is standing better. He has returned to the gym 3x a week. He feels better after his sessions. It helps for a few days.    Eval:  Patient is a 78 year old male with extensive lumbar spine surgery history.  He has had a complete neck fusion in the past.  He also had a fusion from L3-L5.   We recently had L1-L3 lumbar fusion.  He had complications with the surgery and had to have a second surgery.  Since that point he has had significant weakness in his  legs.  He has numbness into his legs.  He also has pain into his groin.  He is having some difficulty with bowel and bladder.  He is able to control it but needs to go to the bathroom every 2-3 hours to try.  He does report this is improving.  He has been using a walker for primary mobility.  He reports over the past few weeks his ability to use the walker is improved significantly.  He is having some difficulty standing up from lower chairs.  He has significant pain at night.  He has difficulty lying in bed.    PERTINENT HISTORY:  C1 through C7 fusion, 8 surgeries on low back and c-spine , L3-S1 fusion done twice. New L1-L3 fusion, Ocular motor nerve injury, CAD   PAIN:  Are you having pain? Yes: NPRS scale: Can reach an 8 out of 10 at night Pain location: Lumbar spine and radiating into his groin Pain description: Aching Aggravating factors: Different positions and nighttime Relieving factors: Changing positions  PRECAUTIONS: Fall  RED FLAGS: Bowel and Bladder dysfunction   WEIGHT BEARING RESTRICTIONS:  WBAT   FALLS:  Has patient fallen in last 6 months? No  LIVING ENVIRONMENT: 2 steps into the house  OCCUPATION:  Retired   Presenter, broadcasting: goes to the gym   PLOF: Independent  PATIENT GOALS:  To walk better   NEXT MD VISIT:  Nothing scheduled   OBJECTIVE:  Note: Objective measures were completed at Evaluation unless otherwise noted.  DIAGNOSTIC FINDINGS:  Post op CT angioplasty:  IMPRESSION: No suspicious finding to suggest pulmonary embolism.   Subsegmental atelectasis/consolidation of lingula.   Scattered pulmonary nodules up to 6 mm. No follow-up needed if patient is low-risk (and has no known or suspected primary neoplasm). Non-contrast chest CT can be considered in 12 months if patient is high-risk.  This recommendation follows the consensus statement: Guidelines for Management of Incidental Pulmonary Nodules Detected on CT Images: From the Fleischner Society 2017; Radiology 2017; 284:228-243.   Atherosclerotic calcifications of aorta and coronary arteries.  PATIENT SURVEYS:  LEFS  Extreme difficulty/unable (0), Quite a bit of difficulty (1), Moderate difficulty (2), Little difficulty (3), No difficulty (4) Survey date:   10/23  Any of your usual work, housework or school activities    2. Usual hobbies, recreational or sporting activities    3. Getting into/out of the bath    4. Walking between rooms    5. Putting on socks/shoes    6. Squatting     7. Lifting an object, like a bag of groceries from the floor    8. Performing light activities around your home    9. Performing heavy activities around your home    10. Getting into/out of a car    11. Walking 2 blocks    12. Walking 1 mile    13. Going up/down 10 stairs (1 flight)    14. Standing for 1 hour    15.  sitting for 1 hour    16. Running on even ground    17. Running on uneven ground    18. Making sharp turns while running fast    19. Hopping     20. Rolling over in bed    Score total:  11/80  34/80     COGNITION: Overall cognitive status: Within functional limits for tasks assessed     SENSATION: Numbness into both legs   POSTURE:  Forward head  Rounded shoulder  Stands in flexed  posture   PALPATION: Significant tightness around L1-L4 bilateral Tightness extending into thoracic spine   LUMBAR ROM:   AROM eval 10/23  Flexion    Extension Pain extending to neutral  Can come close to neutral without legs going numb   Right lateral flexion    Left lateral flexion    Right rotation 50% limited  Between 25 and 50% limited   Left rotation 50% limited  Between 25 and 50% limited    (Blank rows = not tested)  LOWER EXTREMITY ROM:    Not tested today   LOWER EXTREMITY MMT:    MMT Right eval  Left eval Right  10/23 Left  10/23  Hip flexion 23.8 32.0 29.7 37.2  Hip extension      Hip abduction 36.0 50.2 76.6 71.4  Hip adduction      Hip internal rotation      Hip external rotation      Knee flexion      Knee extension 20.1 22.0 70.1 71.7  Ankle dorsiflexion      Ankle plantarflexion      Ankle inversion      Ankle eversion       (Blank rows = not tested)     FUNCTIONAL TESTS:  5x sit to stand 36 seconds   GAIT: Using walker.  Flexed position Decreased hip flexion    TREATMENT DATE:  10/23 Manual:  Trigger point release to lumbar spine and guteals  Reviewed self soft tissue mobilization  Gross side lying lumbar rotation mobilization   Seated wand flexion 3 x 10 3 pounds Seated wand press 3 x 10 3 pounds Both performed with cueing for upright posture  There-ex: strength testing  With review of results and goals  Ball rol out fod and lateral x10 each   10/21 Manual:  Trigger point release to lumbar spine and guteals  Reviewed self soft tissue mobilization  Gross side lying lumbar rotation mobilization   Seated wand flexion 3 x 10 3 pounds Seated wand press 3 x 10 3 pounds Both performed with cueing for upright posture  Tried biceps curls at the walk but back fatigued quickly sat down 3 lbs 3x12   Punches with posture 3x12 2 lbs   10/17 Manual:  Trigger point release to lumbar spine and guteals  Reviewed self soft tissue mobilization   Nuero re-ed Seated wand flexion 3 x 10 3 pounds Seated wand press 3 x 10 3 pounds Both performed with cueing for upright posture  Cable  Pallof press 2x10 10 lbs  Chop 2x10 10 lbs      10/14 Manual:  Trigger point release to lumbar spine and guteals  Reviewed self soft tissue mobilization   Nuero re-ed Seated wand flexion 3 x 10 3 pounds Seated wand press 3 x 10 3 pounds Both performed with cueing for upright posture  Standing weight shift with cueing for glutes squeeze and upright posture 3 x  10  There-ex:  Ball roll out x10 5 sec hold fwd and lateral   10/7 Manual:  Trigger point release to lumbar spine and guteals  Reviewed self soft tissue mobilization  LAD grade II and III 2x10   Neur-re-ed Seated biceps curl with cuing for posture  3 lbs 3x12   2lb punch 3x12 with cuing for posture and breathing   2lb seated shoulder flexion 3x12     10/1 Manual:  Trigger point release to lumbar spine and guteals  Reviewed self soft tissue mobilization  LAD grade II and III 2x10   There-ex:  Ball roll out x10 5 sec hold fwd and lateral   Neuro-re-ed:  Spent time talking about the importance of working around the back but not over extending th back with exercises.   Row [phone] with cuing to keep chest on chest plate 50 lbs. Discussed RPE.   9/25 Manual:  Trigger point release to lumbar spine and guteals  Reviewed self soft tissue mobilization  LAD grade II and III 2x10   There-ex:  Ball roll out x10 5 sec hold fwd and lateral   Neuro-re-ed:  Wall posture 5x 10 sec holds with cuing not to force his posture    PATIENT EDUCATION:  Education details: HEP, symptom management  Person educated: Patient Education method: Explanation, Demonstration, Tactile cues, Verbal cues, and Handouts Education comprehension: verbalized understanding, returned demonstration, verbal cues required, tactile cues required, and needs further education  HOME EXERCISE PROGRAM: Access Code: [email or web address] URL: [email or web address] Date: 08/12/2024 Prepared by: Alm Don  Exercises - Seated Knee Extension with Resistance  - 1 x daily - 7 x weekly - 3 sets - 10 reps - Seated March with Resistance  - 1 x daily - 7 x weekly - 3 sets - 10 reps - Seated Hip Abduction with Resistance  - 1 x daily - 7 x weekly - 3 sets - 10 reps  ASSESSMENT:  CLINICAL IMPRESSION: Therapy performed progress note on the patient. His leg strength has progressed significantly. His ROM is better.  The spasming in his back has improved. He continues to have pain when standing and walking. It has improved slightly but his distance is still limited. He works hard on his exercises. He will continue to benefit from skilled therapy 2W8. See below for goal specific progress.   Eval: Patient is a 78 year old male with a past history significant for several spine surgeries including a complete cervical fusion and a complete lumbar spine fusion.  His most recent surgery resulted in significant weakness of bilateral lower extremities. He also has numbness into his legs. He has spamsing in bilateral lumbar paraspinals extending into his thoracic paraspinals. He is having difficulty walking household distances. He would benefit from killed therapy to return to active lifestyle.  OBJECTIVE IMPAIRMENTS: Abnormal gait, decreased activity tolerance, decreased endurance, decreased knowledge of condition, decreased knowledge of use of DME, decreased ROM, decreased strength, increased fascial restrictions, increased muscle spasms, postural dysfunction, and pain.   ACTIVITY LIMITATIONS: carrying, lifting, sitting, standing, squatting, sleeping, stairs, bed mobility, continence, and locomotion level  PARTICIPATION LIMITATIONS: meal prep, cleaning, laundry, driving, shopping, community activity, and yard work  PERSONAL FACTORS: Past/current experiences and 3+ comorbidities: cervical fusion, past back fusion, current anemia  are also affecting patient's functional outcome.   REHAB POTENTIAL: Good  CLINICAL DECISION MAKING: Unstable/unpredictable numbness in both legs/ significant pain into the groin   EVALUATION COMPLEXITY: High   GOALS: Goals reviewed with patient? Yes  SHORT TERM GOALS: Target date: 09/23/2024    Patient will cone to neutral extension in standing without pain  Baseline: Goal status:improving but still has numbness at neutral 10/17  2.  Patient will increase gross right LE strength by 5  lbs  Baseline:  Goal status will check next visit.10/17  3.  Patient will ambulate in his household with LRAD without significant fatigue  Baseline:  Goal status: INITIAL  4.  Patient will be independent with base HEP. Int he gym and pool  Baseline:  Goal status: INITIAL  LONG TERM GOALS: Target date: 11/04/2024     Patient will sleep through the night without significant pain Baseline:  Goal status: INITIAL  2.  Patient will ambulate community distances without increased pain  Baseline:  Goal status: INITIAL  3.  Patient will go up and down 8 steps with a reciprocal gait pattern  Baseline:  Goal status: INITIAL   PLAN:  PT FREQUENCY: 2x/week  PT DURATION: 8 weeks  PLANNED INTERVENTIONS:[id number]-Therapeutic exercises, 97530- Therapeutic activity, [id number]- Neuromuscular re-education, 97535- Self Care, [id number]- Manual therapy, [id number]- Gait training, [phone]- Aquatic Therapy, [id number]- Electrical stimulation (unattended), 97035- Ultrasound, Patient/Family education, Stair training, Taping, Dry Needling, DME instructions, Cryotherapy, and Moist heat .  PLAN FOR NEXT SESSION: Work on Insurance underwriter on the nu-step  Sit to stand training General strengthening Progress to standing exercises as tolerated. Keep 10 lb lifting restriction until cleared by MD    Alm JINNY Don, PT 10/11/2024, 11:10 AM

## 2024-10-11 ENCOUNTER — Encounter (HOSPITAL_BASED_OUTPATIENT_CLINIC_OR_DEPARTMENT_OTHER): Payer: Self-pay | Admitting: Physical Therapy

## 2024-10-15 ENCOUNTER — Ambulatory Visit (HOSPITAL_BASED_OUTPATIENT_CLINIC_OR_DEPARTMENT_OTHER): Admitting: Physical Therapy

## 2024-10-15 ENCOUNTER — Encounter (HOSPITAL_BASED_OUTPATIENT_CLINIC_OR_DEPARTMENT_OTHER): Payer: Self-pay | Admitting: Physical Therapy

## 2024-10-15 DIAGNOSIS — R2689 Other abnormalities of gait and mobility: Secondary | ICD-10-CM | POA: Diagnosis not present

## 2024-10-15 DIAGNOSIS — M545 Low back pain, unspecified: Secondary | ICD-10-CM | POA: Diagnosis not present

## 2024-10-15 DIAGNOSIS — M542 Cervicalgia: Secondary | ICD-10-CM | POA: Diagnosis not present

## 2024-10-15 DIAGNOSIS — M5459 Other low back pain: Secondary | ICD-10-CM | POA: Diagnosis not present

## 2024-10-15 DIAGNOSIS — R293 Abnormal posture: Secondary | ICD-10-CM

## 2024-10-15 DIAGNOSIS — G8929 Other chronic pain: Secondary | ICD-10-CM | POA: Diagnosis not present

## 2024-10-15 NOTE — Therapy (Signed)
 OUTPATIENT PHYSICAL THERAPY THORACOLUMBAR EVALUATION   Patient Name: [name] MRN: [id number] DOB:Nov 26, 1946, 78 y.o., male Today's Date: 10/15/2024  END OF SESSION:  PT End of Session - 10/15/24 1423     Visit Number 11    Number of Visits 24    Date for Recertification  11/04/24    Authorization Type 24 progress note next visit!!!    PT Start Time 1015    PT Stop Time 1058    PT Time Calculation (min) 43 min    Activity Tolerance Patient tolerated treatment well    Behavior During Therapy [facility] for tasks assessed/performed              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   Paroxysmal atrial flutter (HCC)    Past Surgical History:  Procedure Laterality Date   ABDOMINAL EXPOSURE N/A 01/06/2020   Procedure: ABDOMINAL EXPOSURE;  Surgeon: Oris Krystal FALCON, MD;  Location: [facility] OR;  Service: Vascular;  Laterality: N/A;   ANTERIOR LAT LUMBAR FUSION N/A 06/18/2024   Procedure: ANTERIOR LATERAL LUMBAR FUSION LUMBAR ONE-TWO, LUMBAR TWO-THREE;  Surgeon: Colon Shove, MD;  Location: [facility] OR;  Service: Neurosurgery;  Laterality: N/A;   ANTERIOR LUMBAR FUSION N/A 01/06/2020   Procedure: Lumbar Five- Sacral One  Anterior lumbar interbody fusion with infuse;  Surgeon: Colon Shove, MD;  Location: [facility] OR;  Service: Neurosurgery;  Laterality: N/A;  Lumbar Five- Sacral One  Anterior lumbar interbody fusion with infuse   CERVICAL FUSION  1996, 1997   2 surgery- skull to C7   COLON RESECTION  2007   COLONOSCOPY W/ POLYPECTOMY     CORONARY  ATHERECTOMY N/A 07/01/2021   Procedure: CORONARY ATHERECTOMY;  Surgeon: Verlin Lonni BIRCH, MD;  Location: [facility] IN[facility]ASIVE [facility];  Service: Cardiovascular;  Laterality: N/A;   CORONARY PRESSURE/FFR STUDY N/A 06/18/2021   Procedure: INTRAVASCULAR PRESSURE WIRE/FFR STUDY;  Surgeon: Verlin Lonni BIRCH, MD;  Location: [facility];  Service: Cardiovascular;  Laterality: N/A;   CORONARY STENT INTERVENTION N/A 07/01/2021   Procedure: CORONARY STENT INTERVENTION;  Surgeon: Verlin Lonni BIRCH, MD;  Location: [facility];  Service: Cardiovascular;  Laterality: N/A;   CORONARY ULTRASOUND/IVUS N/A 07/01/2021   Procedure: Intravascular Ultrasound/IVUS;  Surgeon: Verlin Lonni BIRCH, MD;  Location: [facility];  Service: Cardiovascular;  Laterality: N/A;   HEMORRHOID SURGERY N/A 01/13/2016   Procedure: HEMORRHOIDECTOMY AND REMOVAL OF ANAL SKIN TAG;  Surgeon: Vicenta Poli, MD;  Location: [facility] OR;  Service: General;  Laterality: N/A;   LUMBAR LAMINECTOMY/DECOMPRESSION MICRODISCECTOMY N/A 07/01/2024   Procedure: LAMINECTOMY AND FORAMINOTOMY LUMBAR ONE, TWO, LUMBAR TWO, THREE LEFT;  Surgeon: Colon Shove, MD;  Location: [facility] OR;  Service: Neurosurgery;  Laterality: N/A;  LEFT L1-L2, L2-L3 LAMINOTOMY AND FORAMINOTOMY   LUMBAR PERCUTANEOUS PEDICLE SCREW 2 LEVEL N/A 06/18/2024   Procedure: LUMBAR PERCUTANEOUS PEDICLE SCREW LUMBAR ONE-THREE;  Surgeon: Colon Shove, MD;  Location: [facility] OR;  Service: Neurosurgery;  Laterality: N/A;  REPLACEMENT TOTAL KNEE Left 2005   left   RIGHT/LEFT HEART CATH AND CORONARY ANGIOGRAPHY N/A 06/18/2021   Procedure: RIGHT/LEFT HEART CATH AND CORONARY ANGIOGRAPHY;  Surgeon: Verlin Lonni BIRCH, MD;  Location: [facility];  Service: Cardiovascular;  Laterality: N/A;   SYNOVIAL CYST EXCISION     lumbar spine   TONSILLECTOMY     TOTAL KNEE ARTHROPLASTY Right 01/06/2017   Procedure: RIGHT TOTAL KNEE ARTHROPLASTY;  Surgeon: Lamar Collet, MD;  Location:  [facility] ORS;  Service: Orthopedics;  Laterality: Right;  Adductior Block   Patient Active Problem List   Diagnosis Date Noted   Post-op pain 06/30/2024   Lumbar stenosis with neurogenic claudication 06/18/2024   Pure hypercholesterolemia 06/20/2023   Degenerative cervical disc 07/07/2021   Coronary artery disease involving native coronary artery of native heart with unstable angina pectoris (HCC)    CAD (coronary artery disease)    Pseudoarthrosis of lumbar spine 01/06/2020   Coronary artery calcification seen on CT scan 01/29/2019   Change in bowel habits 06/12/2018   Primary osteoarthritis of right knee 01/06/2017   S/P knee replacement 01/06/2017   Diplopia 07/10/2013   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   Sore throat 07/26/2011   DIZZINESS 09/10/2009   ATRIAL FLUTTER, PAROXYSMAL 07/09/2009   Dyslipidemia 07/07/2009   Essential hypertension 07/07/2009    PCP: Ardell Manly MD  REFERRING PROVIDER: Victory Gens MD  REFERRING DIAG:  Diagnosis  M48.061 (ICD-10-CM) - Spinal stenosis, lumbar region without neurogenic claudication    Rationale for Evaluation and Treatment: Rehabilitation  THERAPY DIAG:  Other low back pain  Other abnormalities of gait and mobility  Abnormal posture  ONSET DATE: first surgery 7/1  2nd 7/14    SUBJECTIVE:                                                                                                                                                                                           SUBJECTIVE STATEMENT: He reports he is feeling better.  He still feels like he is bent over.  The patient has been able to to the gym consistently.  He feels like this is helping.   Eval:  Patient is a 78 year old male with extensive lumbar spine surgery history.  He has had a complete neck fusion in the past.  He also had a fusion from L3-L5.  We recently had L1-L3 lumbar fusion.  He had complications with the surgery and had to have a second surgery.   Since that point he has had significant weakness in his legs.  He has numbness into his legs.  He also has pain into his groin.  He is having some difficulty with bowel and bladder.  He is able to control it but needs to go to the bathroom every 2-3 hours to try.  He does report this is improving.  He has been using a walker for primary mobility.  He reports over the past few weeks his ability to use the walker is improved significantly.  He is having some difficulty standing up from lower chairs.  He has significant pain at night.  He has difficulty lying in bed.    PERTINENT HISTORY:  C1 through C7 fusion, 8 surgeries on low back and c-spine , L3-S1 fusion done twice. New L1-L3 fusion, Ocular motor nerve injury, CAD   PAIN:  Are you having pain? Yes: NPRS scale: Can reach an 8 out of 10 at night Pain location: Lumbar spine and radiating into his groin Pain description: Aching Aggravating factors: Different positions and nighttime Relieving factors: Changing positions  PRECAUTIONS: Fall  RED FLAGS: Bowel and Bladder dysfunction   WEIGHT BEARING RESTRICTIONS:  WBAT   FALLS:  Has patient fallen in last 6 months? No  LIVING ENVIRONMENT: 2 steps into the house  OCCUPATION:  Retired   Presenter, Broadcasting: goes to the gym   PLOF: Independent  PATIENT GOALS:  To walk better   NEXT MD VISIT:  Nothing scheduled   OBJECTIVE:  Note: Objective measures were completed at Evaluation unless otherwise noted.  DIAGNOSTIC FINDINGS:  Post op CT angioplasty:  IMPRESSION: No suspicious finding to suggest pulmonary embolism.   Subsegmental atelectasis/consolidation of lingula.   Scattered pulmonary nodules up to 6 mm. No follow-up needed if patient is low-risk (and has no known or suspected primary neoplasm). Non-contrast chest CT can be considered in 12 months if patient is high-risk. This recommendation follows the consensus statement: Guidelines for Management of Incidental Pulmonary  Nodules Detected on CT Images: From the Fleischner Society 2017; Radiology 2017; 284:228-243.   Atherosclerotic calcifications of aorta and coronary arteries.  PATIENT SURVEYS:  LEFS  Extreme difficulty/unable (0), Quite a bit of difficulty (1), Moderate difficulty (2), Little difficulty (3), No difficulty (4) Survey date:   10/23  Any of your usual work, housework or school activities    2. Usual hobbies, recreational or sporting activities    3. Getting into/out of the bath    4. Walking between rooms    5. Putting on socks/shoes    6. Squatting     7. Lifting an object, like a bag of groceries from the floor    8. Performing light activities around your home    9. Performing heavy activities around your home    10. Getting into/out of a car    11. Walking 2 blocks    12. Walking 1 mile    13. Going up/down 10 stairs (1 flight)    14. Standing for 1 hour    15.  sitting for 1 hour    16. Running on even ground    17. Running on uneven ground    18. Making sharp turns while running fast    19. Hopping     20. Rolling over in bed    Score total:  11/80  34/80     COGNITION: Overall cognitive status: Within functional limits for tasks assessed     SENSATION: Numbness into both legs   POSTURE:  Forward head  Rounded shoulder  Stands in flexed posture   PALPATION: Significant tightness around L1-L4 bilateral Tightness extending into thoracic spine   LUMBAR  ROM:   AROM eval 10/23  Flexion    Extension Pain extending to neutral  Can come close to neutral without legs going numb   Right lateral flexion    Left lateral flexion    Right rotation 50% limited  Between 25 and 50% limited   Left rotation 50% limited  Between 25 and 50% limited    (Blank rows = not tested)  LOWER EXTREMITY ROM:    Not tested today   LOWER EXTREMITY MMT:    MMT Right eval Left eval Right  10/23 Left  10/23  Hip flexion 23.8 32.0 29.7 37.2  Hip extension      Hip abduction 36.0  50.2 76.6 71.4  Hip adduction      Hip internal rotation      Hip external rotation      Knee flexion      Knee extension 20.1 22.0 70.1 71.7  Ankle dorsiflexion      Ankle plantarflexion      Ankle inversion      Ankle eversion       (Blank rows = not tested)     FUNCTIONAL TESTS:  5x sit to stand 36 seconds   GAIT: Using walker.  Flexed position Decreased hip flexion    TREATMENT DATE:  10/28 Manual:  Trigger point release to lumbar spine and guteals  Reviewed self soft tissue mobilization  Gross side lying lumbar rotation mobilization   Seated wand flexion 3 x 10 3 pounds Seated wand press 3 x 10 3 pounds Both performed with cueing for upright posture   bicep curl 7lbs 3x12  Punch 4 pounds 3 x 12 Shoulder flexion 3 pounds 3 x 12  10/23 Manual:  Trigger point release to lumbar spine and guteals  Reviewed self soft tissue mobilization  Gross side lying lumbar rotation mobilization   Seated wand flexion 3 x 10 3 pounds Seated wand press 3 x 10 3 pounds Both performed with cueing for upright posture  There-ex: strength testing  With review of results and goals  Ball rol out fod and lateral x10 each   10/21 Manual:  Trigger point release to lumbar spine and guteals  Reviewed self soft tissue mobilization  Gross side lying lumbar rotation mobilization   Seated wand flexion 3 x 10 3 pounds Seated wand press 3 x 10 3 pounds Both performed with cueing for upright posture  Tried biceps curls at the walk but back fatigued quickly sat down 3 lbs 3x12   Punches with posture 3x12 2 lbs   10/17 Manual:  Trigger point release to lumbar spine and guteals  Reviewed self soft tissue mobilization   Nuero re-ed Seated wand flexion 3 x 10 3 pounds Seated wand press 3 x 10 3 pounds Both performed with cueing for upright posture  Cable  Pallof press 2x10 10 lbs  Chop 2x10 10 lbs      10/14 Manual:  Trigger point release to lumbar spine and guteals   Reviewed self soft tissue mobilization   Nuero re-ed Seated wand flexion 3 x 10 3 pounds Seated wand press 3 x 10 3 pounds Both performed with cueing for upright posture  Standing weight shift with cueing for glutes squeeze and upright posture 3 x 10  There-ex:  Ball roll out x10 5 sec hold fwd and lateral   10/7 Manual:  Trigger point release to lumbar spine and guteals  Reviewed self soft tissue mobilization  LAD grade II and III 2x10  Neur-re-ed Seated biceps curl with cuing for posture  3 lbs 3x12   2lb punch 3x12 with cuing for posture and breathing   2lb seated shoulder flexion 3x12     10/1 Manual:  Trigger point release to lumbar spine and guteals  Reviewed self soft tissue mobilization  LAD grade II and III 2x10   There-ex:  Ball roll out x10 5 sec hold fwd and lateral   Neuro-re-ed:  Spent time talking about the importance of working around the back but not over extending th back with exercises.   Row [phone] with cuing to keep chest on chest plate 50 lbs. Discussed RPE.   9/25 Manual:  Trigger point release to lumbar spine and guteals  Reviewed self soft tissue mobilization  LAD grade II and III 2x10   There-ex:  Ball roll out x10 5 sec hold fwd and lateral   Neuro-re-ed:  Wall posture 5x 10 sec holds with cuing not to force his posture    PATIENT EDUCATION:  Education details: HEP, symptom management  Person educated: Patient Education method: Explanation, Demonstration, Tactile cues, Verbal cues, and Handouts Education comprehension: verbalized understanding, returned demonstration, verbal cues required, tactile cues required, and needs further education  HOME EXERCISE PROGRAM: Access Code: [email or web address] URL: [email or web address] Date: 08/12/2024 Prepared by: Alm Don  Exercises - Seated Knee Extension with Resistance  - 1 x daily - 7 x weekly - 3 sets - 10 reps - Seated March with Resistance  - 1 x daily - 7 x weekly -  3 sets - 10 reps - Seated Hip Abduction with Resistance  - 1 x daily - 7 x weekly - 3 sets - 10 reps  ASSESSMENT:  CLINICAL IMPRESSION: Therapy advanced the  patients weight with postural exercises. He could feel it in his back but no aditional pain. Per palpation her had veryfew trigger points today. We will continue to advance his home program and gym program  Eval: Patient is a 78 year old male with a past history significant for several spine surgeries including a complete cervical fusion and a complete lumbar spine fusion.  His most recent surgery resulted in significant weakness of bilateral lower extremities. He also has numbness into his legs. He has spamsing in bilateral lumbar paraspinals extending into his thoracic paraspinals. He is having difficulty walking household distances. He would benefit from killed therapy to return to active lifestyle.  OBJECTIVE IMPAIRMENTS: Abnormal gait, decreased activity tolerance, decreased endurance, decreased knowledge of condition, decreased knowledge of use of DME, decreased ROM, decreased strength, increased fascial restrictions, increased muscle spasms, postural dysfunction, and pain.   ACTIVITY LIMITATIONS: carrying, lifting, sitting, standing, squatting, sleeping, stairs, bed mobility, continence, and locomotion level  PARTICIPATION LIMITATIONS: meal prep, cleaning, laundry, driving, shopping, community activity, and yard work  PERSONAL FACTORS: Past/current experiences and 3+ comorbidities: cervical fusion, past back fusion, current anemia  are also affecting patient's functional outcome.   REHAB POTENTIAL: Good  CLINICAL DECISION MAKING: Unstable/unpredictable numbness in both legs/ significant pain into the groin   EVALUATION COMPLEXITY: High   GOALS: Goals reviewed with patient? Yes  SHORT TERM GOALS: Target date: 09/23/2024    Patient will cone to neutral extension in standing without pain  Baseline: Goal status:improving but  still has numbness at neutral 10/17  2.  Patient will increase gross right LE strength by 5 lbs  Baseline:  Goal status will check next visit.10/17  3.  Patient will ambulate in his household with LRAD without significant fatigue  Baseline:  Goal status: INITIAL  4.  Patient will be independent with base HEP. Int he gym and pool  Baseline:  Goal status: INITIAL   LONG TERM GOALS: Target date: 11/04/2024     Patient will sleep through the night without significant pain Baseline:  Goal status: INITIAL  2.  Patient will ambulate community distances without increased pain  Baseline:  Goal status: INITIAL  3.  Patient will go up and down 8 steps with a reciprocal gait pattern  Baseline:  Goal status: INITIAL   PLAN:  PT FREQUENCY: 2x/week  PT DURATION: 8 weeks  PLANNED INTERVENTIONS:[id number]-Therapeutic exercises, 97530- Therapeutic activity, [id number]- Neuromuscular re-education, 97535- Self Care, [id number]- Manual therapy, [id number]- Gait training, [phone]- Aquatic Therapy, [id number]- Electrical stimulation (unattended), 97035- Ultrasound, Patient/Family education, Stair training, Taping, Dry Needling, DME instructions, Cryotherapy, and Moist heat .  PLAN FOR NEXT SESSION: Work on Insurance Underwriter on the nu-step  Sit to stand training General strengthening Progress to standing exercises as tolerated. Keep 10 lb lifting restriction until cleared by MD    Alm JINNY Don, PT 10/15/2024, 2:26 PM

## 2024-10-16 ENCOUNTER — Other Ambulatory Visit (HOSPITAL_COMMUNITY): Payer: Self-pay | Admitting: Neurological Surgery

## 2024-10-16 DIAGNOSIS — I82463 Acute embolism and thrombosis of calf muscular vein, bilateral: Secondary | ICD-10-CM

## 2024-10-17 ENCOUNTER — Encounter (HOSPITAL_BASED_OUTPATIENT_CLINIC_OR_DEPARTMENT_OTHER): Payer: Self-pay | Admitting: Physical Therapy

## 2024-10-17 ENCOUNTER — Ambulatory Visit (HOSPITAL_BASED_OUTPATIENT_CLINIC_OR_DEPARTMENT_OTHER): Admitting: Physical Therapy

## 2024-10-17 DIAGNOSIS — R293 Abnormal posture: Secondary | ICD-10-CM

## 2024-10-17 DIAGNOSIS — M545 Low back pain, unspecified: Secondary | ICD-10-CM | POA: Diagnosis not present

## 2024-10-17 DIAGNOSIS — G8929 Other chronic pain: Secondary | ICD-10-CM | POA: Diagnosis not present

## 2024-10-17 DIAGNOSIS — M542 Cervicalgia: Secondary | ICD-10-CM

## 2024-10-17 DIAGNOSIS — M5459 Other low back pain: Secondary | ICD-10-CM | POA: Diagnosis not present

## 2024-10-17 DIAGNOSIS — R2689 Other abnormalities of gait and mobility: Secondary | ICD-10-CM

## 2024-10-18 ENCOUNTER — Encounter (HOSPITAL_BASED_OUTPATIENT_CLINIC_OR_DEPARTMENT_OTHER): Payer: Self-pay | Admitting: Physical Therapy

## 2024-10-18 NOTE — Therapy (Signed)
 OUTPATIENT PHYSICAL THERAPY THORACOLUMBAR EVALUATION   Patient Name: [name] MRN: [id number] DOB:08/07/1946, 78 y.o., male Today's Date: 10/18/2024  END OF SESSION:  PT End of Session - 10/17/24 1345     Visit Number 12    Number of Visits 24    Date for Recertification  11/04/24    PT Start Time 2215    PT Stop Time 2255    PT Time Calculation (min) 40 min    Activity Tolerance Patient tolerated treatment well    Behavior During Therapy [facility] for tasks assessed/performed              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   Paroxysmal atrial flutter (HCC)    Past Surgical History:  Procedure Laterality Date   ABDOMINAL EXPOSURE N/A 01/06/2020   Procedure: ABDOMINAL EXPOSURE;  Surgeon: Oris Krystal FALCON, MD;  Location: [facility] OR;  Service: Vascular;  Laterality: N/A;   ANTERIOR LAT LUMBAR FUSION N/A 06/18/2024   Procedure: ANTERIOR LATERAL LUMBAR FUSION LUMBAR ONE-TWO, LUMBAR TWO-THREE;  Surgeon: Colon Shove, MD;  Location: [facility] OR;  Service: Neurosurgery;  Laterality: N/A;   ANTERIOR LUMBAR FUSION N/A 01/06/2020   Procedure: Lumbar Five- Sacral One  Anterior lumbar interbody fusion with infuse;  Surgeon: Colon Shove, MD;  Location: [facility] OR;  Service: Neurosurgery;  Laterality: N/A;  Lumbar Five- Sacral One  Anterior lumbar interbody fusion with infuse   CERVICAL FUSION  1996, 1997   2 surgery- skull to C7   COLON RESECTION  2007   COLONOSCOPY W/ POLYPECTOMY     CORONARY ATHERECTOMY N/A 07/01/2021   Procedure: CORONARY  ATHERECTOMY;  Surgeon: Verlin Lonni BIRCH, MD;  Location: [facility] INVASIVE CV LAB;  Service: Cardiovascular;  Laterality: N/A;   CORONARY PRESSURE/FFR STUDY N/A 06/18/2021   Procedure: INTRAVASCULAR PRESSURE WIRE/FFR STUDY;  Surgeon: Verlin Lonni BIRCH, MD;  Location: [facility] INVASIVE CV LAB;  Service: Cardiovascular;  Laterality: N/A;   CORONARY STENT INTERVENTION N/A 07/01/2021   Procedure: CORONARY STENT INTERVENTION;  Surgeon: Verlin Lonni BIRCH, MD;  Location: [facility] IN[facility];  Service: Cardiovascular;  Laterality: N/A;   CORONARY ULTRASOUND/IVUS N/A 07/01/2021   Procedure: Intravascular Ultrasound/IVUS;  Surgeon: Verlin Lonni BIRCH, MD;  Location: [facility];  Service: Cardiovascular;  Laterality: N/A;   HEMORRHOID SURGERY N/A 01/13/2016   Procedure: HEMORRHOIDECTOMY AND REMOVAL OF ANAL SKIN TAG;  Surgeon: Vicenta Poli, MD;  Location: [facility] OR;  Service: General;  Laterality: N/A;   LUMBAR LAMINECTOMY/DECOMPRESSION MICRODISCECTOMY N/A 07/01/2024   Procedure: LAMINECTOMY AND FORAMINOTOMY LUMBAR ONE, TWO, LUMBAR TWO, THREE LEFT;  Surgeon: Colon Shove, MD;  Location: [facility] OR;  Service: Neurosurgery;  Laterality: N/A;  LEFT L1-L2, L2-L3 LAMINOTOMY AND FORAMINOTOMY   LUMBAR PERCUTANEOUS PEDICLE SCREW 2 LEVEL N/A 06/18/2024   Procedure: LUMBAR PERCUTANEOUS PEDICLE SCREW LUMBAR ONE-THREE;  Surgeon: Colon Shove, MD;  Location: [facility] OR;  Service: Neurosurgery;  Laterality: N/A;   REPLACEMENT TOTAL KNEE Left 2005   left  RIGHT/LEFT HEART CATH AND CORONARY ANGIOGRAPHY N/A 06/18/2021   Procedure: RIGHT/LEFT HEART CATH AND CORONARY ANGIOGRAPHY;  Surgeon: Verlin Lonni BIRCH, MD;  Location: [facility] INV[facility] CV [facility];  Service: Cardiovascular;  Laterality: N/A;   SYNOVIAL CYST EXCISION     lumbar spine   TONSILLECTOMY     TOTAL KNEE ARTHROPLASTY Right 01/06/2017   Procedure: RIGHT TOTAL KNEE ARTHROPLASTY;  Surgeon: Lamar Collet, MD;  Location: [facility] ORS;  Service: Orthopedics;  Laterality: Right;   Adductior Block   Patient Active Problem List   Diagnosis Date Noted   Post-op pain 06/30/2024   Lumbar stenosis with neurogenic claudication 06/18/2024   Pure hypercholesterolemia 06/20/2023   Degenerative cervical disc 07/07/2021   Coronary artery disease involving native coronary artery of native heart with unstable angina pectoris (HCC)    CAD (coronary artery disease)    Pseudoarthrosis of lumbar spine 01/06/2020   Coronary artery calcification seen on CT scan 01/29/2019   Change in bowel habits 06/12/2018   Primary osteoarthritis of right knee 01/06/2017   S/P knee replacement 01/06/2017   Diplopia 07/10/2013   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   Sore throat 07/26/2011   DIZZINESS 09/10/2009   ATRIAL FLUTTER, PAROXYSMAL 07/09/2009   Dyslipidemia 07/07/2009   Essential hypertension 07/07/2009    PCP: Ardell Manly MD  REFERRING PROVIDER: Victory Gens MD  REFERRING DIAG:  Diagnosis  M48.061 (ICD-10-CM) - Spinal stenosis, lumbar region without neurogenic claudication    Rationale for Evaluation and Treatment: Rehabilitation  THERAPY DIAG:  Other low back pain  Other abnormalities of gait and mobility  Abnormal posture  Chronic bilateral low back pain without sciatica  Cervicalgia  ONSET DATE: first surgery 7/1  2nd 7/14    SUBJECTIVE:                                                                                                                                                                                           SUBJECTIVE STATEMENT: He reports he is feeling better.  He still feels like he is bent over.  The patient has been able to to the gym consistently.  He feels like this is helping.   Eval:  Patient is a 78 year old male with extensive lumbar spine surgery history.  He has had a complete neck fusion in the past.  He also had a fusion from L3-L5.  We recently had L1-L3 lumbar fusion.  He had complications with the surgery and had to have a  second surgery.  Since that point he has had significant weakness in his legs.  He has numbness into his legs.  He also has pain into his groin.  He is having some difficulty with bowel and bladder.  He is able to control it but needs to go to the bathroom every 2-3 hours to try.  He does report this is improving.  He has been using a walker for primary mobility.  He reports over the past few weeks his ability to use the walker is improved significantly.  He is having some difficulty standing up from lower chairs.  He has significant pain at night.  He has difficulty lying in bed.    PERTINENT HISTORY:  C1 through C7 fusion, 8 surgeries on low back and c-spine , L3-S1 fusion done twice. New L1-L3 fusion, Ocular motor nerve injury, CAD   PAIN:  Are you having pain? Yes: NPRS scale: Can reach an 8 out of 10 at night Pain location: Lumbar spine and radiating into his groin Pain description: Aching Aggravating factors: Different positions and nighttime Relieving factors: Changing positions  PRECAUTIONS: Fall  RED FLAGS: Bowel and Bladder dysfunction   WEIGHT BEARING RESTRICTIONS:  WBAT   FALLS:  Has patient fallen in last 6 months? No  LIVING ENVIRONMENT: 2 steps into the house  OCCUPATION:  Retired   Presenter, Broadcasting: goes to the gym   PLOF: Independent  PATIENT GOALS:  To walk better   NEXT MD VISIT:  Nothing scheduled   OBJECTIVE:  Note: Objective measures were completed at Evaluation unless otherwise noted.  DIAGNOSTIC FINDINGS:  Post op CT angioplasty:  IMPRESSION: No suspicious finding to suggest pulmonary embolism.   Subsegmental atelectasis/consolidation of lingula.   Scattered pulmonary nodules up to 6 mm. No follow-up needed if patient is low-risk (and has no known or suspected primary neoplasm). Non-contrast chest CT can be considered in 12 months if patient is high-risk. This recommendation follows the consensus statement: Guidelines for Management of Incidental  Pulmonary Nodules Detected on CT Images: From the Fleischner Society 2017; Radiology 2017; 284:228-243.   Atherosclerotic calcifications of aorta and coronary arteries.  PATIENT SURVEYS:  LEFS  Extreme difficulty/unable (0), Quite a bit of difficulty (1), Moderate difficulty (2), Little difficulty (3), No difficulty (4) Survey date:   10/23  Any of your usual work, housework or school activities    2. Usual hobbies, recreational or sporting activities    3. Getting into/out of the bath    4. Walking between rooms    5. Putting on socks/shoes    6. Squatting     7. Lifting an object, like a bag of groceries from the floor    8. Performing light activities around your home    9. Performing heavy activities around your home    10. Getting into/out of a car    11. Walking 2 blocks    12. Walking 1 mile    13. Going up/down 10 stairs (1 flight)    14. Standing for 1 hour    15.  sitting for 1 hour    16. Running on even ground    17. Running on uneven ground    18. Making sharp turns while running fast    19. Hopping     20. Rolling over in bed    Score total:  11/80  34/80     COGNITION: Overall cognitive status: Within functional limits for tasks assessed     SENSATION: Numbness into both legs   POSTURE:  Forward head  Rounded shoulder  Stands in flexed posture   PALPATION: Significant tightness around L1-L4 bilateral Tightness extending into thoracic spine   LUMBAR  ROM:   AROM eval 10/23  Flexion    Extension Pain extending to neutral  Can come close to neutral without legs going numb   Right lateral flexion    Left lateral flexion    Right rotation 50% limited  Between 25 and 50% limited   Left rotation 50% limited  Between 25 and 50% limited    (Blank rows = not tested)  LOWER EXTREMITY ROM:    Not tested today   LOWER EXTREMITY MMT:    MMT Right eval Left eval Right  10/23 Left  10/23  Hip flexion 23.8 32.0 29.7 37.2  Hip extension      Hip  abduction 36.0 50.2 76.6 71.4  Hip adduction      Hip internal rotation      Hip external rotation      Knee flexion      Knee extension 20.1 22.0 70.1 71.7  Ankle dorsiflexion      Ankle plantarflexion      Ankle inversion      Ankle eversion       (Blank rows = not tested)     FUNCTIONAL TESTS:  5x sit to stand 36 seconds   GAIT: Using walker.  Flexed position Decreased hip flexion    TREATMENT DATE:  10/30 Manual:  Trigger point release to lumbar spine and guteals  Reviewed self soft tissue mobilization  Gross side lying lumbar rotation mobilization   Seated wand flexion 3 x 10 3 pounds Seated wand press 3 x 10 3 pounds Both performed with cueing for upright posture   bicep curl 9lbs 3x12  Punch 4 pounds 3 x 12 Shoulder flexion 4 pounds 3 x 12    10/28 Manual:  Trigger point release to lumbar spine and guteals  Reviewed self soft tissue mobilization  Gross side lying lumbar rotation mobilization   Seated wand flexion 3 x 10 3 pounds Seated wand press 3 x 10 3 pounds Both performed with cueing for upright posture   bicep curl 7lbs 3x12  Punch 4 pounds 3 x 12 Shoulder flexion 3 pounds 3 x 12  10/23 Manual:  Trigger point release to lumbar spine and guteals  Reviewed self soft tissue mobilization  Gross side lying lumbar rotation mobilization   Seated wand flexion 3 x 10 3 pounds Seated wand press 3 x 10 3 pounds Both performed with cueing for upright posture  There-ex: strength testing  With review of results and goals  Ball rol out fod and lateral x10 each   10/21 Manual:  Trigger point release to lumbar spine and guteals  Reviewed self soft tissue mobilization  Gross side lying lumbar rotation mobilization   Seated wand flexion 3 x 10 3 pounds Seated wand press 3 x 10 3 pounds Both performed with cueing for upright posture  Tried biceps curls at the walk but back fatigued quickly sat down 3 lbs 3x12   Punches with posture 3x12 2 lbs    10/17 Manual:  Trigger point release to lumbar spine and guteals  Reviewed self soft tissue mobilization   Nuero re-ed Seated wand flexion 3 x 10 3 pounds Seated wand press 3 x 10 3 pounds Both performed with cueing for upright posture  Cable  Pallof press 2x10 10 lbs  Chop 2x10 10 lbs      10/14 Manual:  Trigger point release to lumbar spine and guteals  Reviewed self soft tissue mobilization   Nuero re-ed Seated wand flexion 3 x  10 3 pounds Seated wand press 3 x 10 3 pounds Both performed with cueing for upright posture  Standing weight shift with cueing for glutes squeeze and upright posture 3 x 10  There-ex:  Ball roll out x10 5 sec hold fwd and lateral   10/7 Manual:  Trigger point release to lumbar spine and guteals  Reviewed self soft tissue mobilization  LAD grade II and III 2x10   Neur-re-ed Seated biceps curl with cuing for posture  3 lbs 3x12   2lb punch 3x12 with cuing for posture and breathing   2lb seated shoulder flexion 3x12     10/1 Manual:  Trigger point release to lumbar spine and guteals  Reviewed self soft tissue mobilization  LAD grade II and III 2x10   There-ex:  Ball roll out x10 5 sec hold fwd and lateral   Neuro-re-ed:  Spent time talking about the importance of working around the back but not over extending th back with exercises.   Row [phone] with cuing to keep chest on chest plate 50 lbs. Discussed RPE.   9/25 Manual:  Trigger point release to lumbar spine and guteals  Reviewed self soft tissue mobilization  LAD grade II and III 2x10   There-ex:  Ball roll out x10 5 sec hold fwd and lateral   Neuro-re-ed:  Wall posture 5x 10 sec holds with cuing not to force his posture    PATIENT EDUCATION:  Education details: HEP, symptom management  Person educated: Patient Education method: Explanation, Demonstration, Tactile cues, Verbal cues, and Handouts Education comprehension: verbalized understanding, returned  demonstration, verbal cues required, tactile cues required, and needs further education  HOME EXERCISE PROGRAM: Access Code: [email or web address] URL: [email or web address] Date: 08/12/2024 Prepared by: Alm Don  Exercises - Seated Knee Extension with Resistance  - 1 x daily - 7 x weekly - 3 sets - 10 reps - Seated March with Resistance  - 1 x daily - 7 x weekly - 3 sets - 10 reps - Seated Hip Abduction with Resistance  - 1 x daily - 7 x weekly - 3 sets - 10 reps  ASSESSMENT:  CLINICAL IMPRESSION: The patient continues to make great progress. He was advised he can schedule more appointments or just keep doing what he is doing at the gym. We were able to advance his weight with functional movement today. He was advised these are exercises he can continue to do. We discussed technique in the gym. Therapy will continue t progress as tolerated.   Eval: Patient is a 78 year old male with a past history significant for several spine surgeries including a complete cervical fusion and a complete lumbar spine fusion.  His most recent surgery resulted in significant weakness of bilateral lower extremities. He also has numbness into his legs. He has spamsing in bilateral lumbar paraspinals extending into his thoracic paraspinals. He is having difficulty walking household distances. He would benefit from killed therapy to return to active lifestyle.  OBJECTIVE IMPAIRMENTS: Abnormal gait, decreased activity tolerance, decreased endurance, decreased knowledge of condition, decreased knowledge of use of DME, decreased ROM, decreased strength, increased fascial restrictions, increased muscle spasms, postural dysfunction, and pain.   ACTIVITY LIMITATIONS: carrying, lifting, sitting, standing, squatting, sleeping, stairs, bed mobility, continence, and locomotion level  PARTICIPATION LIMITATIONS: meal prep, cleaning, laundry, driving, shopping, community activity, and yard work  PERSONAL FACTORS:  Past/current experiences and 3+ comorbidities: cervical fusion, past back fusion, current anemia  are also affecting patient's functional outcome.   REHAB POTENTIAL: Good  CLINICAL DECISION MAKING: Unstable/unpredictable numbness in both legs/ significant pain into the groin   EVALUATION COMPLEXITY: High   GOALS: Goals reviewed with patient? Yes  SHORT TERM GOALS: Target date: 09/23/2024    Patient will cone to neutral extension in standing without pain  Baseline: Goal status:improving but still has numbness at neutral 10/17  2.  Patient will increase gross right LE strength by 5 lbs  Baseline:  Goal status will check next visit.10/17  3.  Patient will ambulate in his household with LRAD without significant fatigue  Baseline:  Goal status: INITIAL  4.  Patient will be independent with base HEP. Int he gym and pool  Baseline:  Goal status: INITIAL   LONG TERM GOALS: Target date: 11/04/2024     Patient will sleep through the night without significant pain Baseline:  Goal status: INITIAL  2.  Patient will ambulate community distances without increased pain  Baseline:  Goal status: INITIAL  3.  Patient will go up and down 8 steps with a reciprocal gait pattern  Baseline:  Goal status: INITIAL   PLAN:  PT FREQUENCY: 2x/week  PT DURATION: 8 weeks  PLANNED INTERVENTIONS:[id number]-Therapeutic exercises, 97530- Therapeutic activity, [id number]- Neuromuscular re-education, 97535- Self Care, [id number]- Manual therapy, [id number]- Gait training, [phone]- Aquatic Therapy, 97014- Electrical stimulation (unattended), 97035- Ultrasound, Patient/Family education, Stair training, Taping, Dry Needling, DME instructions, Cryotherapy, and Moist heat .  PLAN FOR NEXT SESSION: Work on Insurance Underwriter on the nu-step  Sit to stand training General strengthening Progress to standing exercises as tolerated. Keep 10 lb lifting restriction until cleared by MD    Alm JINNY Don,  PT 10/18/2024, 8:07 AM

## 2024-10-22 ENCOUNTER — Ambulatory Visit (HOSPITAL_COMMUNITY)
Admission: RE | Admit: 2024-10-22 | Discharge: 2024-10-22 | Disposition: A | Discharge status: Home / Self Care | Source: Ambulatory Visit | Attending: Vascular Surgery | Admitting: Vascular Surgery

## 2024-10-22 DIAGNOSIS — I82463 Acute embolism and thrombosis of calf muscular vein, bilateral: Secondary | ICD-10-CM | POA: Diagnosis not present

## 2024-10-23 DIAGNOSIS — Z6833 Body mass index (BMI) 33.0-33.9, adult: Secondary | ICD-10-CM | POA: Diagnosis not present

## 2024-10-23 DIAGNOSIS — M5416 Radiculopathy, lumbar region: Secondary | ICD-10-CM | POA: Diagnosis not present

## 2024-10-24 ENCOUNTER — Telehealth: Payer: Self-pay | Admitting: Internal Medicine

## 2024-10-24 NOTE — Telephone Encounter (Signed)
 Pt c/o swelling/edema: STAT if pt has developed SOB within 24 hours  If swelling, where is the swelling located? Ankles   How much weight have you gained and in what time span? N/a  Have you gained 2 pounds in a day or 5 pounds in a week? N/a   Do you have a log of your daily weights (if so, list)? No   Are you currently taking a fluid pill? Yes   Are you currently SOB? No   Have you traveled recently in a car or plane for an extended period of time? No  Pt c/o medication issue:  1. Name of Medication: amLODipine  (NORVASC ) 5 MG tablet   2. How are you currently taking this medication (dosage and times per day)?   TAKE 1 TABLET BY MOUTH TWICE DAILY    3. Are you having a reaction (difficulty breathing--STAT)? Swelling   4. What is your medication issue? Pt has had severe swelling in ankles, even having surgery for blood clots in them. Pt is concerned this medication is causing the swelling. Please advise.

## 2024-10-28 NOTE — Telephone Encounter (Signed)
 Pt c/o medication issue:  1. Name of Medication: amLODipine  (NORVASC ) 5 MG tablet   2. How are you currently taking this medication (dosage and times per day)? As written  3. Are you having a reaction (difficulty breathing--STAT)? No  4. What is your medication issue? Pt calling back regarding changing above medication to something else. Please advise

## 2024-10-28 NOTE — Telephone Encounter (Signed)
 Called patient back about message. Patient stated he has been having swelling in ankles for about 6 months and has been wearing compression socks to help, but he was wondering if there was something less he could take for his BP instead of amlodipine . Patient stated his BP is usually  135/60 and the SBP goes up to 150's in the doctor's office. Patient feels like the amlodipine  might be causing his ankle swelling. Will forward to Dr. Okey for advisement.

## 2024-10-29 DIAGNOSIS — I4892 Unspecified atrial flutter: Secondary | ICD-10-CM | POA: Diagnosis not present

## 2024-10-29 DIAGNOSIS — R7303 Prediabetes: Secondary | ICD-10-CM | POA: Diagnosis not present

## 2024-10-29 DIAGNOSIS — F1120 Opioid dependence, uncomplicated: Secondary | ICD-10-CM | POA: Diagnosis not present

## 2024-10-29 DIAGNOSIS — M542 Cervicalgia: Secondary | ICD-10-CM | POA: Diagnosis not present

## 2024-10-29 DIAGNOSIS — E782 Mixed hyperlipidemia: Secondary | ICD-10-CM | POA: Diagnosis not present

## 2024-10-29 DIAGNOSIS — I10 Essential (primary) hypertension: Secondary | ICD-10-CM | POA: Diagnosis not present

## 2024-10-30 NOTE — Telephone Encounter (Signed)
 I saw [name] in 2023   He ws seen by NP in June 2025 Could cut back on amlodipine     Switch out with hydralazine  to 10 mg tid   Follow BP closely

## 2024-10-31 MED ORDER — HYDRALAZINE HCL 10 MG PO TABS: 10.0000 mg | ORAL_TABLET | Freq: Three times a day (TID) | ORAL | 2 refills | Status: DC

## 2024-10-31 NOTE — Telephone Encounter (Signed)
 Spoke with patient and shared Dr. Okey' recommendation to stop amlodipine  and start hydralazine  10 mg TID. Rx sent to [facility].  Instructed patient to check his BP at home daily and call with update on BP readings and swelling in 1-2 weeks.  Patient stated he has had much difficulty getting in touch with our office over the past 2 weeks. He reports he called our main office number [phone]) and was on hold for 2-4 hours on 2 different occasions. He also reports when the voice prompt instructs you may leave your number and someone will callback that he has received a callback but it takes a while before he can speak to anyone to have his concerns addressed. Patient expressed dissatisfaction with phone/communication system for patients reaching out to our office.

## 2024-11-07 DIAGNOSIS — M5416 Radiculopathy, lumbar region: Secondary | ICD-10-CM | POA: Diagnosis not present

## 2024-11-19 ENCOUNTER — Other Ambulatory Visit: Payer: Self-pay | Admitting: Internal Medicine

## 2024-11-21 ENCOUNTER — Telehealth: Payer: Self-pay | Admitting: Internal Medicine

## 2024-11-21 MED ORDER — BENAZEPRIL HCL 20 MG PO TABS: 20.0000 mg | ORAL_TABLET | Freq: Two times a day (BID) | ORAL | 1 refills | Status: AC

## 2024-11-21 MED ORDER — HYDRALAZINE HCL 10 MG PO TABS: 10.0000 mg | ORAL_TABLET | Freq: Three times a day (TID) | ORAL | 2 refills | Status: AC

## 2024-11-21 NOTE — Telephone Encounter (Signed)
*  STAT* If patient is at the pharmacy, call can be transferred to refill team.   1. Which medications need to be refilled? (please list name of each medication and dose if known)   benazepril  (LOTENSIN ) 20 MG tablet     2. Would you like to learn more about the convenience, safety, & potential cost savings by using the [facility]? NO   3. Are you open to using the [facility] NO   4. Which pharmacy/location (including street and city if local pharmacy) is medication to be sent to?  [facility] - [place], [place] - [address]     5. Do they need a 30 day or 90 day supply? 90

## 2024-11-21 NOTE — Telephone Encounter (Signed)
 Pt's medication was sent to pt's pharmacy as requested. Confirmation received.

## 2024-11-21 NOTE — Telephone Encounter (Signed)
 *  STAT* If patient is at the pharmacy, call can be transferred to refill team.   1. Which medications need to be refilled? (please list name of each medication and dose if known)   hydrALAZINE  (APRESOLINE ) 10 MG tablet   2. Which pharmacy/location (including street and city if local pharmacy) is medication to be sent to?  [facility] - [place], [place] - [address]   3. Do they need a 30 day or 90 day supply? 90 days  Patient is requesting to get a 90 days supply going forward

## 2024-11-21 NOTE — Telephone Encounter (Signed)
 Requested Prescriptions   Signed Prescriptions Disp Refills   hydrALAZINE  (APRESOLINE ) 10 MG tablet 90 tablet 2    Sig: Take 1 tablet (10 mg total) by mouth 3 (three) times daily.    Authorizing Provider: OKEY VINA GAILS    Ordering User: Cloma Rahrig  C

## 2025-01-05 NOTE — Progress Notes (Unsigned)
 "   Cardiology Office Note   Date:  01/06/2025   ID:  [name], DOB 10-22-1946, MRN [id number]  PCP:  Ransom Other, MD  Cardiologist:   Vina Gull, MD  Pt presents for f/u of CAD     History of Present Illness: [name] is a 79 y.o. male with a history of paroxysmal atrial flutter (rare, sensed), HTN and HL  Echo in 2017 LVEF was normal   Holter monitor showed 8.7% PVCs  Freq PACs     2018  Pt complained of SOB with activity CT negatvie for PE   Calcifications of coronary arteries noted   Myoview  normal    June 2022  Pt complained complained of  DOE      He went on to have a CT coronary angiogram and then LHC (see below) and eventual atherectomy with DES to LAD (see below)   2023  Echo LVEF normal     I saw the pt in Nov 2023   He has been seen by APPs since  The pt had back surgery last year  COmplications lead to second surgery  ([facility])  The pt says since the surgery he has continued pain   He also has numbness and weakness in legs   Can't walk upright He is just now getting back to [facility]  He denies CP  Breathing is OK  No palpitations   Outpatient Medications Prior to Visit  Medication Sig Dispense Refill   benazepril  (LOTENSIN ) 20 MG tablet Take 1 tablet (20 mg total) by mouth 2 (two) times daily. 180 tablet 1   celecoxib  (CELEBREX ) 200 MG capsule Take 1 capsule (200 mg total) by mouth daily as needed. 30 capsule 11   cephALEXin  (KEFLEX ) 500 MG capsule Take 1 capsule (500 mg total) by mouth 3 (three) times daily. 30 capsule 0   Cholecalciferol  (VITAMIN D3) 50 MCG (2000 UT) TABS Take 2,000 Units by mouth daily with lunch.     dexamethasone  (DECADRON ) 1 MG tablet 2 tablets twice daily for 2 days, one tablet twice daily for 2 days, one tablet daily for 2 days. 15 tablet 0   fluocinonide (LIDEX) 0.05 % external solution Apply 1 Application topically 2 (two) times daily as needed (neck rash/irritation.).     hydrALAZINE  (APRESOLINE ) 10 MG tablet Take 1 tablet (10  mg total) by mouth 3 (three) times daily. 90 tablet 2   hydrochlorothiazide  (,MICROZIDE /HYDRODIURIL ,) 12.5 MG capsule Take 12.5 mg by mouth daily with lunch.      ipratropium (ATROVENT) 0.06 % nasal spray Place 2 sprays into both nostrils daily as needed for rhinitis.     methocarbamol  (ROBAXIN ) 500 MG tablet Take 500 mg by mouth every 8 (eight) hours as needed for muscle spasms.     Multiple Vitamin (MULTIVITAMIN WITH MINERALS) TABS tablet Take 1 tablet by mouth every evening. Adult 50+     nitroGLYCERIN  (NITROSTAT ) 0.4 MG SL tablet Place 1 tablet (0.4 mg total) under the tongue every 5 (five) minutes as needed. 25 tablet 2   oxyCODONE  (OXY IR/ROXICODONE ) 5 MG immediate release tablet Take 1 tablet (5 mg total) by mouth every 6 (six) hours as needed. 120 tablet 0   pregabalin  (LYRICA ) 50 MG capsule Take 1 capsule (50 mg total) by mouth 3 (three) times daily. 90 capsule 2   rosuvastatin  (CRESTOR ) 10 MG tablet TAKE 1 TABLET BY MOUTH DAILY 90 tablet 2   tadalafil  (CIALIS ) 5 MG tablet Take 1 tablet (5 mg total)  by mouth daily. (Patient taking differently: Take 5 mg by mouth daily as needed for erectile dysfunction.) 30 tablet 11   cyanocobalamin  (VITAMIN B12) 1000 MCG/ML injection Inject 1,000 mcg into the muscle every 30 (thirty) days. (Patient not taking: Reported on 01/06/2025)     dexamethasone  (DECADRON ) 1 MG tablet 2 tablets twice daily for 2 days, one tablet twice daily for 2 days, one tablet daily for 2 days. (Patient not taking: Reported on 01/06/2025) 15 tablet 0   No facility-administered medications prior to visit.     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   Paroxysmal atrial flutter (HCC)     Past Surgical History:  Procedure Laterality Date   ABDOMINAL EXPOSURE N/A 01/06/2020   Procedure: ABDOMINAL EXPOSURE;  Surgeon: Oris Krystal FALCON, MD;  Location: [facility] OR;  Service: Vascular;  Laterality: N/A;   ANTERIOR LAT LUMBAR FUSION N/A 06/18/2024   Procedure: ANTERIOR LATERAL LUMBAR FUSION LUMBAR ONE-TWO, LUMBAR TWO-THREE;  Surgeon: Colon Shove, MD;  Location: [facility] OR;  Service: Neurosurgery;  Laterality: N/A;   ANTERIOR LUMBAR FUSION N/A 01/06/2020   Procedure: Lumbar Five- Sacral One  Anterior lumbar interbody fusion with infuse;  Surgeon: Colon Shove, MD;  Location[facility];  Service: Neurosurgery;  Laterality: N/A;  Lumbar Five- Sacral One  Anterior lumbar interbody fusion with infuse   CERVICAL FUSION  1996, 1997   2 surgery- skull to C7   COLON RESECTION  2007   COLONOSCOPY W/ POLYPECTOMY     CORONARY ATHERECTOMY N/A 07/01/2021   Procedure: CORONARY ATHERECTOMY;  Surgeon: Verlin Lonni BIRCH, MD;  Location: [facility];  Service: Cardiovascular;  Laterality: N/A;   CORONARY PRESSURE/FFR STUDY N/A 06/18/2021   Procedure: INTRAVASCULAR PRESSURE WIRE/FFR STUDY;  Surgeon: Verlin Lonni BIRCH, MD;  Location: [facility];  Service: Cardiovascular;  Laterality: N/A;   CORONARY STENT INTERVENTION N/A 07/01/2021   Procedure: CORONARY STENT INTERVENTION;  Surgeon: Verlin Lonni BIRCH, MD;  Location: [facility];  Service: Cardiovascular;  Laterality: N/A;   CORONARY ULTRASOUND/IVUS N/A 07/01/2021   Procedure: Intravascular Ultrasound/IVUS;  Surgeon: Verlin Lonni BIRCH, MD;  Location: [facility];  Service: Cardiovascular;  Laterality: N/A;   HEMORRHOID  SURGERY N/A 01/13/2016   Procedure: HEMORRHOIDECTOMY AND REMOVAL OF ANAL SKIN TAG;  Surgeon: Vicenta Poli, MD;  Location: [facility] OR;  Service: General;  Laterality: N/A;   LUMBAR LAMINECTOMY/DECOMPRESSION MICRODISCECTOMY N/A 07/01/2024   Procedure: LAMINECTOMY AND FORAMINOTOMY LUMBAR ONE, TWO, LUMBAR TWO, THREE LEFT;  Surgeon: Colon Shove, MD;  Location: [facility] OR;  Service: Neurosurgery;  Laterality: N/A;  LEFT L1-L2, L2-L3 LAMINOTOMY AND FORAMINOTOMY   LUMBAR PERCUTANEOUS PEDICLE SCREW 2 LEVEL N/A 06/18/2024   Procedure: LUMBAR PERCUTANEOUS PEDICLE SCREW LUMBAR ONE-THREE;  Surgeon: Colon Shove, MD;  Location: [facility] OR;  Service: Neurosurgery;  Laterality: N/A;   REPLACEMENT TOTAL KNEE Left 2005  left   RIGHT/LEFT HEART CATH AND CORONARY ANGIOGRAPHY N/A 06/18/2021   Procedure: RIGHT/LEFT HEART CATH AND CORONARY ANGIOGRAPHY;  Surgeon: Verlin Lonni BIRCH, MD;  Location: [facility];  Service: Cardiovascular;  Laterality: N/A;   SYNOVIAL CYST EXCISION     lumbar spine   TONSILLECTOMY     TOTAL KNEE ARTHROPLASTY Right 01/06/2017   Procedure: RIGHT TOTAL KNEE ARTHROPLASTY;  Surgeon: Lamar Collet, MD;  Location: [facility] ORS;  Service: Orthopedics;  Laterality: Right;  Adductior Block     Social History:  The patient  reports that he has never smoked. He has never used smokeless tobacco. He reports that he does not drink alcohol and does not use drugs.   Family History:  The patient's family history includes Coronary artery disease in his father and mother.    ROS:  Please see the history of present illness. All other systems are reviewed and  Negative to the above problem except as noted.    PHYSICAL EXAM: VS:  BP (!) 144/94   Pulse 100   Ht 5' 10 (1.778 m)   Wt 219 lb 6.4 oz (99.5 kg)   SpO2 99%   BMI 31.48 kg/m    GEN: Obese 79 yo  in no acute distress  HEENT: normal  Neck: JVP normal  No carotid bruits Cardiac: RRR.  No  murmurs  Respiratory:  clear to auscultation bilaterally,   GI: soft, nontender,   Ext  NO LE edema    EKG:  EKG is  not ordered  today   CARDIAC STUDIES     Echo Sept 2023 Left ventricular ejection fraction, by estimation, is 55 to 60%. The left ventricle has normal function. The left ventricle has no regional wall motion abnormalities. The left ventricular internal cavity size was mildly dilated. Left ventricular diastolic parameters are consistent with Grade I diastolic dysfunction (impaired relaxation). 1. Right ventricular systolic function is normal. The right ventricular size is normal. Tricuspid regurgitation signal is inadequate for assessing PA pressure. 2. The mitral valve is grossly normal. Trivial mitral valve regurgitation. No evidence of mitral stenosis. 3. The aortic valve is tricuspid. There is mild calcification of the aortic valve. Aortic valve regurgitation is not visualized. Aortic valve sclerosis is present, with no evidence of aortic valve stenosis. 4. The inferior vena cava is dilated in size with >50% respiratory variability, suggesting right atrial pressure of 8 mmHg.  ATHRECTOMY  07/01/21    Prox LAD to Mid LAD lesion is 80% stenosed.   Mid LAD lesion is 80% stenosed.   A drug-eluting stent was successfully placed using a STENT ONYX FRONTIER 2.75X12, STENT ONYX FRONTIER 2.5 x 15, STENT ONYX FRONTIER 2.25 x 8.   Post intervention, there is a 0% residual stenosis.   Post intervention, there is a 0% residual stenosis.   Severe mid LAD stenosis Successful orbital atherectomy mid LAD with placement of 3 overlapping drug eluting stents.    Recommendations: Continue DAPT with ASA and Plavix  for at least 12 months given long overlapping stent segment. Will discharge home today after 6 hours bedrest.   Cardiac cath 06/18/21  Ost RCA to Prox RCA lesion is 50% stenosed. Mid RCA lesion is 40% stenosed. Dist RCA lesion is 30% stenosed. Prox LAD to Mid LAD lesion is 80% stenosed. Mid LAD lesion is 80% stenosed.   1.  Severe, heavily calcified proximal and mid LAD stenosis. This is flow limiting by pressure wire analysis.  2. No obstructive disease in the Circumflex  3. Moderate non-obstructive disease in the ostial/proximal RCA, mid RCA 4. No significant elevation of right and left heart pressures   Recommendations: He has complex heavily calcified disease in the proximal and mid LAD. The more proximal lesion involves a large septal branch and the mid lesion involves a large diagonal branch. He will be brought back in several weeks for staged PCI of the LAD with orbital atherectomy and stenting. I suspect that it will require a long segment of stenting from the proximal through mid LAD. Will load with Plavix  as an outpatient.  Chest CT  1. Stable subpleural density in the peripheral right middle lobe likely represents scarring. 2. Mildly prominent bilateral axillary lymph nodes again noted of unclear significance and potentially representing reactive nodes. The largest right axillary lymph node shows slight decrease in size since 2018.   CT coronary angiogram  Coronary calcium  score: The patient's coronary artery calcium  score is 1948, which places the patient in the 88th percentile.   Coronary arteries: Normal coronary origins.  Right dominance.   Right Coronary Artery: Dominant. Heavily calcified. Diffuse mixed mild 25-49% stenoses (CADRADS2), with likely moderate (CADRADS3) stenosis in the mid-vessel.   Left Main Coronary Artery: Normal. Trifurcates into the LAD, small ramus intermedius branch and LCX arteries.   Left Anterior Descending Coronary Artery: Heavily calcified proximal LAD with at least moderate 50-69% (CADRADS3) to possibly severe proximal stenosis. Smaller mid-diagonal branch with minimal 1-24% mixed (CADRADS1) stenosis.   Ramus Intermedius Coronary Artery: Smaller caliber, calcified proximally with probably minimal 1-24% stenosis (CADRADS1).   Left Circumflex Artery: The LCx  is poorly visualized. There is at least mild mixed 25-49% proximal stenosis (CADRADS2).   Aorta: Normal size, 30 mm at the mid ascending aorta (level of the PA bifurcation) measured double oblique. Aortic atherosclerosis. No dissection.   Aortic Valve: Trileaflet.  Annular calcification.   Other findings:   Normal pulmonary vein drainage into the left atrium.   Normal left atrial appendage without a thrombus.   Normal size of the pulmonary artery.   IMPRESSION: 1. Heavy multivessel coronary calcification with moderate to possibly severe CAD of the LAD, CADRADS = 3. CT FFR will be performed and reported separately.   2. Coronary calcium  score of 1948. This was 88th percentile for age and sex matched control.   3. Normal coronary origin with right dominance.   4. Aortic atherosclerosis.   Lipid Panel    Component Value Date/Time   CHOL 99 (L) 10/04/2021 1119   TRIG 106 10/04/2021 1119   HDL 30 (L) 10/04/2021 1119   CHOLHDL 3.3 10/04/2021 1119   CHOLHDL 4.4 09/05/2016 1029   VLDL 22 09/05/2016 1029   LDLCALC 49 10/04/2021 1119      Wt Readings from Last 3 Encounters:  01/06/25 219 lb 6.4 oz (99.5 kg)  07/23/24 235 lb (106.6 kg)  07/01/24 227 lb (103 kg)      ASSESSMENT AND PLAN:  1   CAD   Pt underwent athrectomy and overlapping stent placemnt to LAD in 2022    Denies angina     He is now s/p athrectomy and placement of overlapping Onyx stents.   Doing very well  Need to confirm antiplatelet agent   Had been on Plavix  prior to back surgery    2  paoxysmal atrial flutter Remote episode   He was  extremely symptomatic when he has had in past   No documented recurrence  Pt denies palpitations   3 HTN BP is a  little high today   He was stressed when taken   Also in pain and tired from walk in  The pt says his BP was better on amlodipine     THis was stopped and he was placed on hydralazine    He forgets to take all doses   Will review     May switch back to  amlodipine      4  HL Pt on Crestor   Will check lipids     Check CMET, CBC< TSH < A1C and PSA today    Disposition:   FU in Sept 2026  Pt to call if BP high   Signed, Vina Gull, MD  "

## 2025-01-06 ENCOUNTER — Ambulatory Visit: Attending: Internal Medicine | Admitting: Internal Medicine

## 2025-01-06 ENCOUNTER — Encounter: Payer: Self-pay | Admitting: Internal Medicine

## 2025-01-06 ENCOUNTER — Telehealth: Payer: Self-pay | Admitting: Internal Medicine

## 2025-01-06 VITALS — BP 144/94 | HR 100 | Ht 70.0 in | Wt 219.4 lb

## 2025-01-06 DIAGNOSIS — E785 Hyperlipidemia, unspecified: Secondary | ICD-10-CM | POA: Diagnosis not present

## 2025-01-06 DIAGNOSIS — I2510 Atherosclerotic heart disease of native coronary artery without angina pectoris: Secondary | ICD-10-CM | POA: Diagnosis not present

## 2025-01-06 DIAGNOSIS — I10 Essential (primary) hypertension: Secondary | ICD-10-CM | POA: Diagnosis not present

## 2025-01-06 NOTE — Telephone Encounter (Signed)
 Pt's wants to notify Dr. Okey of weight, he states after leaving office he re-weighed hisself and it was 243. Pt would like a c/b regarding this matter to see if he still need slabs was ordered. Please advise.

## 2025-01-06 NOTE — Patient Instructions (Signed)
 Medication Instructions:  Your physician recommends that you continue on your current medications as directed. Please refer to the Current Medication list given to you today.  *If you need a refill on your cardiac medications before your next appointment, please call your pharmacy*  Lab Work: CBC, CMET, TSH, VIT D, PSA, NMR, A1C If you have labs (blood work) drawn today and your tests are completely normal, you will receive your results only by: MyChart Message (if you have MyChart) OR A paper copy in the mail If you have any lab test that is abnormal or we need to change your treatment, we will call you to review the results.  Testing/Procedures: NONE  Follow-Up: At [facility], you and your health needs are our priority.  As part of our continuing mission to provide you with exceptional heart care, our providers are all part of one team.  This team includes your primary Cardiologist (physician) and Advanced Practice Providers or APPs (Physician Assistants and Nurse Practitioners) who all work together to provide you with the care you need, when you need it.  Your next appointment:   9 month(s)  Provider:   Vina Gull, MD  We recommend signing up for the patient portal called MyChart.  Sign up information is provided on this After Visit Summary.  MyChart is used to connect with patients for Virtual Visits (Telemedicine).  Patients are able to view lab/test results, encounter notes, upcoming appointments, etc.  Non-urgent messages can be sent to your provider as well.   To learn more about what you can do with MyChart, go to [email or web address].   Other Instructions

## 2025-01-07 LAB — VITAMIN D 25 HYDROXY (VIT D DEFICIENCY, FRACTURES): Vit D, 25-Hydroxy: 30.7 ng/mL (ref 30.0–100.0)

## 2025-01-07 LAB — COMPREHENSIVE METABOLIC PANEL WITH GFR
ALT: 26 IU/L (ref 0–44)
AST: 22 IU/L (ref 0–40)
Albumin: 4.3 g/dL (ref 3.8–4.8)
Alkaline Phosphatase: 97 IU/L (ref 47–123)
BUN/Creatinine Ratio: 26 — ABNORMAL HIGH (ref 10–24)
BUN: 28 mg/dL — ABNORMAL HIGH (ref 8–27)
Bilirubin Total: 0.5 mg/dL (ref 0.0–1.2)
CO2: 21 mmol/L (ref 20–29)
Calcium: 9.6 mg/dL (ref 8.6–10.2)
Chloride: 105 mmol/L (ref 96–106)
Creatinine, Ser: 1.06 mg/dL (ref 0.76–1.27)
Globulin, Total: 1.9 g/dL (ref 1.5–4.5)
Glucose: 111 mg/dL — ABNORMAL HIGH (ref 70–99)
Potassium: 4.2 mmol/L (ref 3.5–5.2)
Sodium: 142 mmol/L (ref 134–144)
Total Protein: 6.2 g/dL (ref 6.0–8.5)
eGFR: 71 mL/min/1.73

## 2025-01-07 LAB — TSH: TSH: 2.78 u[IU]/mL (ref 0.450–4.500)

## 2025-01-07 LAB — PSA: Prostate Specific Ag, Serum: 0.5 ng/mL (ref 0.0–4.0)

## 2025-01-07 LAB — CBC
Hematocrit: 36.8 % — ABNORMAL LOW (ref 37.5–51.0)
Hemoglobin: 11.9 g/dL — ABNORMAL LOW (ref 13.0–17.7)
MCH: 32 pg (ref 26.6–33.0)
MCHC: 32.3 g/dL (ref 31.5–35.7)
MCV: 99 fL — ABNORMAL HIGH (ref 79–97)
Platelets: 199 x10E3/uL (ref 150–450)
RBC: 3.72 x10E6/uL — ABNORMAL LOW (ref 4.14–5.80)
RDW: 15.5 % — ABNORMAL HIGH (ref 11.6–15.4)
WBC: 7 x10E3/uL (ref 3.4–10.8)

## 2025-01-08 ENCOUNTER — Ambulatory Visit: Payer: Self-pay | Admitting: Internal Medicine

## 2025-01-08 ENCOUNTER — Telehealth: Payer: Self-pay

## 2025-01-08 NOTE — Telephone Encounter (Signed)
 Return call to pt about having PSA labs. Pt state's his cardiologist did blood work which included PSA. Verbal from Dr. Sherrilee to cancel upcoming lab appointment with our office and he can use PSA labs from Pt's cardiologist.  Pt is made aware and voiced understanding

## 2025-01-09 MED ORDER — NITROGLYCERIN 0.4 MG SL SUBL: 0.4000 mg | SUBLINGUAL_TABLET | SUBLINGUAL | 2 refills | Status: AC | PRN

## 2025-01-09 MED ORDER — AMLODIPINE BESYLATE 5 MG PO TABS: ORAL_TABLET | ORAL | 3 refills | Status: AC

## 2025-01-09 NOTE — Telephone Encounter (Signed)
 Patient re- weighed self at another doctors office (family friend) and says this weight (243 lbs) is consistent with his usual weight.  Of note, he cannot remember to take the mid day dose of hydralazine  and asks if he could be changed either back to amlodipine  or something else. His BP readings taking BID dosing of hydralazine  are running systolic 130's-140   (He threw away his amlodipine  and will need new rx if it is resumed)   Please advise

## 2025-01-09 NOTE — Telephone Encounter (Signed)
 Patient will stop hydralazine , re-start amlodipine   5 mg daily, may increase to bid

## 2025-01-09 NOTE — Telephone Encounter (Signed)
 Yes,  Switch back to amlodipine    Start at 5 mg daily   Follow BP   Can increase to 5 bid  Stop hydralazine 

## 2025-01-17 ENCOUNTER — Telehealth: Payer: Self-pay

## 2025-01-17 NOTE — Telephone Encounter (Signed)
 Patient made aware and voiced understanding.

## 2025-01-17 NOTE — Telephone Encounter (Signed)
-----   Message from Belvie Clara, MD sent at 01/14/2025  9:59 AM EST ----- PSa is normal ----- Message ----- From: Gretta Carlos SAUNDERS, CMA Sent: 01/08/2025   8:46 AM EST To: Belvie LITTIE Clara, MD  Please review from pt's cardio. FYI: Cancel LAB appointment on 02/04/2025

## 2025-02-04 ENCOUNTER — Other Ambulatory Visit: Payer: Medicare Other

## 2025-02-10 ENCOUNTER — Ambulatory Visit: Payer: Medicare Other | Admitting: Urology
